# Patient Record
Sex: Female | Born: 1935 | Race: Black or African American | Hispanic: No | Smoking: Never smoker
Health system: Southern US, Community
[De-identification: ages and names within clinical notes are randomized; demographics above are authoritative.]

## PROBLEM LIST (undated history)

## (undated) DIAGNOSIS — M1990 Unspecified osteoarthritis, unspecified site: Secondary | ICD-10-CM

## (undated) DIAGNOSIS — E039 Hypothyroidism, unspecified: Secondary | ICD-10-CM

## (undated) DIAGNOSIS — I10 Essential (primary) hypertension: Secondary | ICD-10-CM

## (undated) DIAGNOSIS — B9681 Helicobacter pylori [H. pylori] as the cause of diseases classified elsewhere: Secondary | ICD-10-CM

## (undated) DIAGNOSIS — K2970 Gastritis, unspecified, without bleeding: Secondary | ICD-10-CM

## (undated) HISTORY — PX: ABDOMINAL HYSTERECTOMY: SHX81

## (undated) HISTORY — PX: CERVICAL FUSION: SHX112

## (undated) HISTORY — PX: JOINT REPLACEMENT: SHX530

## (undated) HISTORY — PX: CARPAL TUNNEL RELEASE: SHX101

---

## 1999-07-16 ENCOUNTER — Encounter: Payer: Self-pay | Admitting: Orthopedic Surgery

## 1999-07-19 ENCOUNTER — Encounter: Payer: Self-pay | Admitting: Orthopedic Surgery

## 1999-07-19 ENCOUNTER — Inpatient Hospital Stay (HOSPITAL_COMMUNITY): Admission: RE | Admit: 1999-07-19 | Discharge: 1999-07-21 | Payer: Self-pay | Admitting: Orthopedic Surgery

## 2001-09-25 ENCOUNTER — Ambulatory Visit (HOSPITAL_COMMUNITY): Admission: RE | Admit: 2001-09-25 | Discharge: 2001-09-25 | Payer: Self-pay | Admitting: General Surgery

## 2001-09-25 ENCOUNTER — Encounter: Payer: Self-pay | Admitting: General Surgery

## 2001-10-14 ENCOUNTER — Other Ambulatory Visit: Admission: RE | Admit: 2001-10-14 | Discharge: 2001-10-14 | Payer: Self-pay | Admitting: General Surgery

## 2004-10-29 ENCOUNTER — Ambulatory Visit: Payer: Self-pay | Admitting: Family Medicine

## 2005-01-09 ENCOUNTER — Ambulatory Visit: Payer: Self-pay | Admitting: Podiatry

## 2005-08-22 ENCOUNTER — Ambulatory Visit: Payer: Self-pay | Admitting: Family Medicine

## 2005-10-23 ENCOUNTER — Ambulatory Visit (HOSPITAL_COMMUNITY): Admission: RE | Admit: 2005-10-23 | Discharge: 2005-10-23 | Payer: Self-pay | Admitting: Ophthalmology

## 2005-12-25 ENCOUNTER — Ambulatory Visit (HOSPITAL_COMMUNITY): Admission: RE | Admit: 2005-12-25 | Discharge: 2005-12-25 | Payer: Self-pay | Admitting: Family Medicine

## 2005-12-25 ENCOUNTER — Ambulatory Visit: Payer: Self-pay | Admitting: Family Medicine

## 2006-01-03 ENCOUNTER — Ambulatory Visit: Payer: Self-pay | Admitting: Family Medicine

## 2006-01-06 ENCOUNTER — Ambulatory Visit: Payer: Self-pay | Admitting: Internal Medicine

## 2006-01-20 ENCOUNTER — Ambulatory Visit: Payer: Self-pay | Admitting: Internal Medicine

## 2006-02-12 ENCOUNTER — Ambulatory Visit: Payer: Self-pay | Admitting: Internal Medicine

## 2006-02-13 ENCOUNTER — Ambulatory Visit (HOSPITAL_COMMUNITY): Admission: RE | Admit: 2006-02-13 | Discharge: 2006-02-13 | Payer: Self-pay | Admitting: Ophthalmology

## 2006-03-12 ENCOUNTER — Ambulatory Visit: Payer: Self-pay | Admitting: Internal Medicine

## 2006-07-09 ENCOUNTER — Ambulatory Visit: Payer: Self-pay | Admitting: Internal Medicine

## 2006-07-18 ENCOUNTER — Ambulatory Visit: Payer: Self-pay | Admitting: Internal Medicine

## 2006-07-31 ENCOUNTER — Ambulatory Visit: Payer: Self-pay | Admitting: Internal Medicine

## 2006-08-20 ENCOUNTER — Ambulatory Visit: Payer: Self-pay | Admitting: Internal Medicine

## 2006-11-26 ENCOUNTER — Ambulatory Visit: Payer: Self-pay | Admitting: Orthopedic Surgery

## 2006-12-02 ENCOUNTER — Encounter (HOSPITAL_COMMUNITY): Admission: RE | Admit: 2006-12-02 | Discharge: 2007-01-01 | Payer: Self-pay | Admitting: Orthopedic Surgery

## 2006-12-15 ENCOUNTER — Ambulatory Visit (HOSPITAL_COMMUNITY): Admission: RE | Admit: 2006-12-15 | Discharge: 2006-12-15 | Payer: Self-pay | Admitting: Internal Medicine

## 2006-12-23 ENCOUNTER — Encounter: Payer: Self-pay | Admitting: Internal Medicine

## 2006-12-23 DIAGNOSIS — R011 Cardiac murmur, unspecified: Secondary | ICD-10-CM

## 2006-12-23 DIAGNOSIS — M545 Low back pain, unspecified: Secondary | ICD-10-CM | POA: Insufficient documentation

## 2006-12-23 DIAGNOSIS — E785 Hyperlipidemia, unspecified: Secondary | ICD-10-CM

## 2006-12-23 DIAGNOSIS — R7989 Other specified abnormal findings of blood chemistry: Secondary | ICD-10-CM | POA: Insufficient documentation

## 2006-12-23 DIAGNOSIS — J449 Chronic obstructive pulmonary disease, unspecified: Secondary | ICD-10-CM

## 2006-12-23 DIAGNOSIS — S62109A Fracture of unspecified carpal bone, unspecified wrist, initial encounter for closed fracture: Secondary | ICD-10-CM | POA: Insufficient documentation

## 2006-12-23 DIAGNOSIS — E0590 Thyrotoxicosis, unspecified without thyrotoxic crisis or storm: Secondary | ICD-10-CM | POA: Insufficient documentation

## 2006-12-23 DIAGNOSIS — G43909 Migraine, unspecified, not intractable, without status migrainosus: Secondary | ICD-10-CM | POA: Insufficient documentation

## 2006-12-23 DIAGNOSIS — J309 Allergic rhinitis, unspecified: Secondary | ICD-10-CM | POA: Insufficient documentation

## 2006-12-23 DIAGNOSIS — I739 Peripheral vascular disease, unspecified: Secondary | ICD-10-CM | POA: Insufficient documentation

## 2006-12-23 DIAGNOSIS — H269 Unspecified cataract: Secondary | ICD-10-CM

## 2006-12-23 DIAGNOSIS — I10 Essential (primary) hypertension: Secondary | ICD-10-CM | POA: Insufficient documentation

## 2006-12-23 DIAGNOSIS — J4489 Other specified chronic obstructive pulmonary disease: Secondary | ICD-10-CM | POA: Insufficient documentation

## 2006-12-23 DIAGNOSIS — K219 Gastro-esophageal reflux disease without esophagitis: Secondary | ICD-10-CM

## 2007-01-02 ENCOUNTER — Encounter (HOSPITAL_COMMUNITY): Admission: RE | Admit: 2007-01-02 | Discharge: 2007-02-01 | Payer: Self-pay | Admitting: Orthopedic Surgery

## 2007-01-14 ENCOUNTER — Ambulatory Visit (HOSPITAL_COMMUNITY): Admission: RE | Admit: 2007-01-14 | Discharge: 2007-01-14 | Payer: Self-pay | Admitting: Internal Medicine

## 2007-09-23 ENCOUNTER — Encounter (HOSPITAL_COMMUNITY): Admission: RE | Admit: 2007-09-23 | Discharge: 2007-10-23 | Payer: Self-pay | Admitting: Endocrinology

## 2007-11-19 ENCOUNTER — Encounter: Payer: Self-pay | Admitting: Family Medicine

## 2007-12-14 ENCOUNTER — Encounter (HOSPITAL_COMMUNITY): Admission: RE | Admit: 2007-12-14 | Discharge: 2008-01-13 | Payer: Self-pay | Admitting: Endocrinology

## 2008-01-01 ENCOUNTER — Ambulatory Visit (HOSPITAL_COMMUNITY): Admission: RE | Admit: 2008-01-01 | Discharge: 2008-01-01 | Payer: Self-pay | Admitting: Internal Medicine

## 2008-11-18 HISTORY — PX: COLON SURGERY: SHX602

## 2009-01-04 ENCOUNTER — Ambulatory Visit (HOSPITAL_COMMUNITY): Admission: RE | Admit: 2009-01-04 | Discharge: 2009-01-04 | Payer: Self-pay | Admitting: Internal Medicine

## 2009-01-30 ENCOUNTER — Emergency Department (HOSPITAL_COMMUNITY): Admission: EM | Admit: 2009-01-30 | Discharge: 2009-01-30 | Payer: Self-pay | Admitting: Emergency Medicine

## 2009-02-07 ENCOUNTER — Ambulatory Visit (HOSPITAL_COMMUNITY): Admission: RE | Admit: 2009-02-07 | Discharge: 2009-02-07 | Payer: Self-pay | Admitting: Internal Medicine

## 2009-02-10 ENCOUNTER — Ambulatory Visit (HOSPITAL_COMMUNITY): Admission: RE | Admit: 2009-02-10 | Discharge: 2009-02-10 | Payer: Self-pay | Admitting: Internal Medicine

## 2009-03-01 ENCOUNTER — Ambulatory Visit: Payer: Self-pay | Admitting: Neurosurgery

## 2009-03-15 ENCOUNTER — Inpatient Hospital Stay (HOSPITAL_COMMUNITY): Admission: RE | Admit: 2009-03-15 | Discharge: 2009-03-16 | Payer: Self-pay | Admitting: Neurosurgery

## 2009-03-18 HISTORY — PX: ESOPHAGOGASTRODUODENOSCOPY: SHX1529

## 2009-03-18 HISTORY — PX: COLONOSCOPY: SHX174

## 2009-04-15 ENCOUNTER — Inpatient Hospital Stay (HOSPITAL_COMMUNITY): Admission: EM | Admit: 2009-04-15 | Discharge: 2009-04-16 | Payer: Self-pay | Admitting: Emergency Medicine

## 2009-04-15 ENCOUNTER — Ambulatory Visit: Payer: Self-pay | Admitting: Gastroenterology

## 2009-04-16 ENCOUNTER — Ambulatory Visit: Payer: Self-pay | Admitting: Gastroenterology

## 2009-04-16 ENCOUNTER — Encounter: Payer: Self-pay | Admitting: Gastroenterology

## 2009-04-17 ENCOUNTER — Telehealth: Payer: Self-pay | Admitting: Gastroenterology

## 2009-04-18 ENCOUNTER — Encounter: Payer: Self-pay | Admitting: Gastroenterology

## 2009-04-19 ENCOUNTER — Encounter: Payer: Self-pay | Admitting: Gastroenterology

## 2009-04-25 ENCOUNTER — Telehealth: Payer: Self-pay | Admitting: Gastroenterology

## 2009-04-27 ENCOUNTER — Telehealth (INDEPENDENT_AMBULATORY_CARE_PROVIDER_SITE_OTHER): Payer: Self-pay

## 2009-05-19 ENCOUNTER — Encounter (INDEPENDENT_AMBULATORY_CARE_PROVIDER_SITE_OTHER): Payer: Self-pay | Admitting: Surgery

## 2009-05-19 ENCOUNTER — Inpatient Hospital Stay (HOSPITAL_COMMUNITY): Admission: RE | Admit: 2009-05-19 | Discharge: 2009-05-24 | Payer: Self-pay | Admitting: Surgery

## 2009-05-24 ENCOUNTER — Encounter: Payer: Self-pay | Admitting: Gastroenterology

## 2010-12-18 NOTE — Letter (Signed)
Summary: Historic Patient File  Historic Patient File   Imported By: Lind Guest 08/17/2010 16:04:26  _____________________________________________________________________  External Attachment:    Type:   Image     Comment:   External Document

## 2011-02-24 LAB — BASIC METABOLIC PANEL
Calcium: 8.5 mg/dL (ref 8.4–10.5)
Creatinine, Ser: 0.74 mg/dL (ref 0.4–1.2)
GFR calc Af Amer: >60 mL/min (ref >60)
Potassium: 3.5 mEq/L (ref 3.5–5.1)

## 2011-02-24 LAB — CBC
Hemoglobin: 11.1 g/dL — ABNORMAL LOW (ref 12.0–15.0)
MCHC: 33.8 g/dL (ref 30.0–36.0)
WBC: 10.8 10*3/uL — ABNORMAL HIGH (ref 4.0–10.5)

## 2011-02-25 LAB — COMPREHENSIVE METABOLIC PANEL
CO2: 29 mEq/L (ref 19–32)
Chloride: 97 mEq/L (ref 96–112)
GFR calc non Af Amer: >60 mL/min (ref >60)
Total Bilirubin: 0.6 mg/dL (ref 0.3–1.2)
Total Protein: 8 g/dL (ref 6.0–8.3)

## 2011-02-25 LAB — CBC
MCV: 82.8 fL (ref 78.0–100.0)
Platelets: 401 10*3/uL — ABNORMAL HIGH (ref 150–400)
RBC: 4.67 MIL/uL (ref 3.87–5.11)

## 2011-02-25 LAB — TYPE AND SCREEN: Antibody Screen: NEGATIVE

## 2011-02-25 LAB — DIFFERENTIAL
Basophils Absolute: 0 10*3/uL (ref 0.0–0.1)
Basophils Relative: 0 % (ref 0–1)
Eosinophils Relative: 4 % (ref 0–5)
Lymphs Abs: 1.5 10*3/uL (ref 0.7–4.0)
Neutro Abs: 5.5 10*3/uL (ref 1.7–7.7)
Neutrophils Relative %: 70 % (ref 43–77)

## 2011-02-25 LAB — PROTIME-INR: INR: 1 (ref 0.00–1.49)

## 2011-02-26 LAB — BASIC METABOLIC PANEL
BUN: 4 mg/dL — ABNORMAL LOW (ref 6–23)
Calcium: 9.1 mg/dL (ref 8.4–10.5)
Chloride: 100 mEq/L (ref 96–112)
Chloride: 105 mEq/L (ref 96–112)
Creatinine, Ser: 0.73 mg/dL (ref 0.4–1.2)
GFR calc Af Amer: >60 mL/min (ref >60)
GFR calc Af Amer: >60 mL/min (ref >60)
GFR calc non Af Amer: >60 mL/min (ref >60)
Glucose, Bld: 120 mg/dL — ABNORMAL HIGH (ref 70–99)
Potassium: 3.5 mEq/L (ref 3.5–5.1)

## 2011-02-26 LAB — DIFFERENTIAL
Eosinophils Absolute: 0.3 10*3/uL (ref 0.0–0.7)
Eosinophils Relative: 5 % (ref 0–5)
Lymphocytes Relative: 21 % (ref 12–46)
Lymphs Abs: 1.2 10*3/uL (ref 0.7–4.0)
Monocytes Absolute: 0.5 10*3/uL (ref 0.1–1.0)
Monocytes Relative: 9 % (ref 3–12)
Neutro Abs: 3.5 10*3/uL (ref 1.7–7.7)
Neutro Abs: 4.1 10*3/uL (ref 1.7–7.7)
Neutrophils Relative %: 63 % (ref 43–77)
Neutrophils Relative %: 70 % (ref 43–77)

## 2011-02-26 LAB — TYPE AND SCREEN
ABO/RH(D): O NEG
Antibody Screen: NEGATIVE

## 2011-02-26 LAB — CBC
HCT: 27.9 % — ABNORMAL LOW (ref 36.0–46.0)
MCV: 79.1 fL (ref 78.0–100.0)
MCV: 81.8 fL (ref 78.0–100.0)
Platelets: 467 10*3/uL — ABNORMAL HIGH (ref 150–400)
RBC: 3.53 MIL/uL — ABNORMAL LOW (ref 3.87–5.11)
RBC: 3.65 MIL/uL — ABNORMAL LOW (ref 3.87–5.11)
WBC: 5.5 10*3/uL (ref 4.0–10.5)

## 2011-02-26 LAB — CEA: CEA: 1.2 ng/mL (ref 0.0–5.0)

## 2011-02-26 LAB — PROTIME-INR
INR: 1.1 (ref 0.00–1.49)
Prothrombin Time: 14.3 seconds (ref 11.6–15.2)

## 2011-02-26 LAB — HEMOGLOBIN AND HEMATOCRIT, BLOOD: HCT: 24.7 % — ABNORMAL LOW (ref 36.0–46.0)

## 2011-02-27 LAB — CBC
HCT: 41.2 % (ref 36.0–46.0)
Hemoglobin: 13.8 g/dL (ref 12.0–15.0)
MCHC: 33.6 g/dL (ref 30.0–36.0)
RBC: 5.13 MIL/uL — ABNORMAL HIGH (ref 3.87–5.11)
RDW: 15.7 % — ABNORMAL HIGH (ref 11.5–15.5)

## 2011-02-27 LAB — BASIC METABOLIC PANEL
CO2: 30 mEq/L (ref 19–32)
Chloride: 95 mEq/L — ABNORMAL LOW (ref 96–112)
Glucose, Bld: 119 mg/dL — ABNORMAL HIGH (ref 70–99)
Potassium: 3.7 mEq/L (ref 3.5–5.1)
Sodium: 137 mEq/L (ref 135–145)

## 2011-02-28 LAB — DIFFERENTIAL
Basophils Absolute: 0 10*3/uL (ref 0.0–0.1)
Basophils Relative: 0 % (ref 0–1)
Eosinophils Absolute: 0.2 10*3/uL (ref 0.0–0.7)
Eosinophils Relative: 2 % (ref 0–5)
Lymphs Abs: 1.2 10*3/uL (ref 0.7–4.0)
Neutrophils Relative %: 80 % — ABNORMAL HIGH (ref 43–77)

## 2011-02-28 LAB — CBC
Hemoglobin: 12.8 g/dL (ref 12.0–15.0)
MCHC: 33.8 g/dL (ref 30.0–36.0)
MCV: 79.5 fL (ref 78.0–100.0)
RBC: 4.76 MIL/uL (ref 3.87–5.11)
WBC: 8.9 10*3/uL (ref 4.0–10.5)

## 2011-02-28 LAB — URINALYSIS, ROUTINE W REFLEX MICROSCOPIC
Bilirubin Urine: NEGATIVE
Glucose, UA: NEGATIVE mg/dL
Hgb urine dipstick: NEGATIVE
Protein, ur: NEGATIVE mg/dL
Urobilinogen, UA: 0.2 mg/dL (ref 0.0–1.0)

## 2011-02-28 LAB — COMPREHENSIVE METABOLIC PANEL
ALT: 20 U/L (ref 0–35)
AST: 27 U/L (ref 0–37)
Alkaline Phosphatase: 67 U/L (ref 39–117)
CO2: 30 mEq/L (ref 19–32)
Calcium: 9.8 mg/dL (ref 8.4–10.5)
Chloride: 93 mEq/L — ABNORMAL LOW (ref 96–112)
GFR calc Af Amer: >60 mL/min (ref >60)
GFR calc non Af Amer: >60 mL/min (ref >60)
Glucose, Bld: 108 mg/dL — ABNORMAL HIGH (ref 70–99)
Sodium: 136 mEq/L (ref 135–145)
Total Bilirubin: 0.5 mg/dL (ref 0.3–1.2)

## 2011-02-28 LAB — URINE MICROSCOPIC-ADD ON

## 2011-02-28 LAB — POCT CARDIAC MARKERS: Troponin i, poc: <0.05 ng/mL (ref 0.00–0.09)

## 2011-02-28 LAB — TSH: TSH: 0.459 u[IU]/mL (ref 0.350–4.500)

## 2011-04-02 NOTE — Discharge Summary (Signed)
NAMEALEZANDRA, [name]                ACCOUNT NO.:  [id number]   MEDICAL RECORD NO.:  [id number]          PATIENT TYPE:  INP   LOCATION:  1528                         FACILITY:  [facility]   PHYSICIAN:  Wilmon Arms. Corliss Skains, M.D. DATE OF BIRTH:  Jan 23, 1929   DATE OF ADMISSION:  05/19/2009  DATE OF DISCHARGE:  05/24/2009                               DISCHARGE SUMMARY   ADMISSION DIAGNOSIS:  Cecal mass.   DISCHARGE DIAGNOSIS:  Cecal mass.   PROCEDURE PERFORMED:  Laparoscopic-assisted right hemicolectomy.   BRIEF HISTORY:  This is a 77 year old female who has a past medical  history of hypertension, hypothyroidism, hiatal hernia, hyperlipidemia  heart murmur, who presents after having a recent GI bleed.  GI workup  included a large cecal mass seen on colonoscopy.  It was biopsied and  did not show carcinoma, but the gross examination was highly suspicious.  The initial biopsy was read as inflamed granulation tissue and some  atypical surface cells.  Based on these findings, it was recommended  that she have a right hemicolectomy.  Her CEA level preoperatively was  normal.   HOSPITAL COURSE:  After a bowel prep at home, the patient was admitted  to the hospital on May 19, 2009, where she underwent a laparoscopic-  assisted right hemicolectomy.  The patient tolerated the procedure well.  She was maintained on clear liquids with a PCA pump postoperatively.  She began having bowel sounds and some flatus on postop day #3.  As the  ileus resolved, her diet was slowly advanced.  She was advanced to a  regular diet on postop day #4.  Her pathology report returned finding no  malignancy.  The mucosa was ulcerated and showed some granulation tissue  and reactive changes, but no sign of cancer.  This was relayed to the  patient.  On the date of discharge, she is having bowel movements,  tolerating regular diet and her pain is controlled with p.r.n. Darvocet.  Her incisions all look good.   DISCHARGE  INSTRUCTIONS:  She was given Darvocet p.r.n. for pain.  She  should resume all of her previous medications.  Follow-up in 1 week with  Dr. Corliss Skains for staple removal.      Wilmon Arms. Tsuei, M.D.  Electronically Signed     MKT/MEDQ  D:  05/24/2009  T:  05/24/2009  Job:  [id number]

## 2011-04-02 NOTE — Op Note (Signed)
[name], [name]                ACCOUNT NO.:  [id number]   MEDICAL RECORD NO.:  [id number]          PATIENT TYPE:  INP   LOCATION:  A339                          FACILITY:  [facility]   PHYSICIAN:  Kassie Mends, M.D.      DATE OF BIRTH:  Mar 18, 1929   DATE OF PROCEDURE:  04/16/2009  DATE OF DISCHARGE:  04/16/2009                               OPERATIVE REPORT   PROCEDURE:  1. Ileocolonoscopy with jumbo cold forceps biopsies.  2. Esophagogastroduodenoscopy with cold forceps biopsies.   INDICATION FOR EXAM:  [name] is a 77 year old female who presents  with new-onset dyspepsia and painless rectal bleeding.   FINDINGS:  1. Normal terminal ileum approximately 5 cm visualized.  2. Large circumferential mass in the cecum extending just proximal to      the ileocecal valve.  It appeared ulcerated.  Jumbo biopsies were      obtained via cold forceps.  Otherwise, no evidence of polyps,      diverticula or AVMs.  3. Normal retroflexed view of the rectum.  4. Normal esophagus without evidence of Barrett's, mass, erosion,      ulceration or stricture.  5. Diffuse erythema in the stomach without erosion or ulceration.      Biopsies obtained via cold forceps to evaluate for H. pylori      gastritis.  6. Patchy erythema in the duodenal bulb and extending into the second      portion of the duodenum.  No erosions or ulcerations.  Moderate      bile staining.   DIAGNOSES:  1. Large cecal mass, likely adenocarcinoma, biopsies pending.  2. Moderate gastritis, biopsies pending.   RECOMMENDATIONS:  1. Continue PPI daily.  2. Avoid aspirin and anti-inflammatory drugs.  May continue Pletal.  3. Will obtain CT scan of the abdomen and pelvis with IV and oral      contrast, chest x-ray PA and lateral, and CEA today.  4. May advance to a low-residue diet.  She may be discharged to home.  5. I discussed these findings with [name] and her family.  They      prefer a surgical evaluation in  [place].  She will be referred      to [facility], in [place], at [phone].   MEDICATIONS:  1. Demerol 50 mg IV.  2. Versed 6 mg IV.   PROCEDURE TECHNIQUE:  Physical exam was performed.  Informed consent was  obtained from the patient after explaining the benefits, risks and  alternatives to the procedure.  The patient was connected to the monitor  and placed in the left lateral position.  Continuous oxygen was provided  by nasal cannula and IV medicine administered through an indwelling  cannula.  After administration of sedation and rectal exam, the  patient's rectum was intubated and the scope was advanced under direct  visualization to the distal terminal ileum.  The scope was removed  slowly by carefully examining the color, texture, anatomy and integrity  of the mucosa on the way out.   After colonoscopy, the patient's esophagus was intubated  with a  diagnostic gastroscope.  The endoscope was advanced under direct  visualization to the second portion of the duodenum.  The scope was  removed slowly by carefully examining the color, texture, anatomy and  integrity of the mucosa on the way out.  The patient was recovered in  endoscopy and discharged home in satisfactory condition.   ADDENDUM:  The CT scan of the abdomen and pelvis showed a  circumferential cecal mass.  She had some subcentimeter lymph nodes in  the region.  Otherwise, the liver was normal.  The chest x-ray, PA and  lateral, showed some mild cardiomegaly but had no evidence of metastatic  disease.  Her CEA is 1.2. Discussed with pt 04/17/09.      Kassie Mends, M.D.  Electronically Signed     SM/MEDQ  D:  04/17/2009  T:  04/17/2009  Job:  [id number]   cc:   Tesfaye D. Felecia Shelling, MD  Fax: [phone]   Wilmon Arms. Corliss Skains, M.D.  [address] [place] [phone]  [place]

## 2011-04-02 NOTE — Discharge Summary (Signed)
NAMEBRYANAH, [name]                ACCOUNT NO.:  [id number]   MEDICAL RECORD NO.:  [id number]          PATIENT TYPE:  INP   LOCATION:  A339                          FACILITY:  [facility]   PHYSICIAN:  Tesfaye D. Felecia Shelling, MD   DATE OF BIRTH:  01/05/1929   DATE OF ADMISSION:  04/15/2009  DATE OF DISCHARGE:  05/30/2010LH                               DISCHARGE SUMMARY   DISCHARGE DIAGNOSES:  1. Lower gastrointestinal bleed secondary to cecal mass.  2. Anemia secondary to the above.  3. Cervical degenerative disk disease and status post cervical      diskectomy.  4. Hypertension.  5. Osteoarthritis.  6. Hypothyroidism.  7. Peripheral vascular disease.   DISCHARGE MEDICATIONS:  1. KCl 20 mEq daily.  2. Felodipine 10 mg daily.  3. Clonidine 0.2 mg daily.  4. Alprazolam 0.5 mg b.i.d.  5. Simvastatin 40 mg daily.  6. Cilostazol 100 mg p.o. b.i.d.  7. Synthroid 88 mcg daily.  8. Metoprolol 100 mg daily.  9. Furosemide 40 mg daily.  10.Omeprazole 20 mg daily.  11.ProAir handheld inhaler q.4 h. as needed.   HOSPITAL COURSE:  This is a 77 year old female patient with history of  multiple medical illnesses, who was admitted through emergency room due  to rectal bleed.  The patient noticed a sudden onset of rectal bleed.  Her hemoglobin and hematocrit dropped to 8.5 and 24.7.  The patient was  admitted and transfused 2 units of packed red blood cells.  GI consult  was done and the patient underwent colonoscopy, which showed a large  cecal mass.  The patient was discharged after arrangement was done with  GI to be followed for further treatment.      Tesfaye D. Felecia Shelling, MD  Electronically Signed     TDF/MEDQ  D:  05/09/2009  T:  05/09/2009  Job:  [id number]

## 2011-04-02 NOTE — Op Note (Signed)
[name], [name]                ACCOUNT NO.:  [id number]   MEDICAL RECORD NO.:  [id number]          PATIENT TYPE:  INP   LOCATION:  3535                         FACILITY:  [facility]   PHYSICIAN:  Donalee Citrin, M.D.        DATE OF BIRTH:  03/07/1929   DATE OF PROCEDURE:  03/15/2009  DATE OF DISCHARGE:                               OPERATIVE REPORT   PREOPERATIVE DIAGNOSIS:  Cervical spondylosis with stenosis, spinal cord  compression, and some gliosis in the spinal cord with myelopathy at C4-  C5.   PROCEDURE:  Anterior cervical diskectomy and fusion at C4-C5 using a 7-  mm allograft wedge and a 25-mm Venture plate with four 30-mm variable  angle screws.   SURGEON:  Donalee Citrin, MD   ASSISTANT:  Kathaleen Maser. Pool, MD   ANESTHESIA:  General endotracheal.   HISTORY OF PRESENT ILLNESS:  The patient is a very pleasant 79-year  female with a history of C5-C7 fusion.  She has had progressive  worsening pain into both shoulders and weakness in arms and hands.  MRI  scan showed severe spinal cord compression at C4-C5 with some signal  change in the cord due to combination of disk with spondylosis.  The  patient was recommended anterior cervical diskectomy and fusion due to  exam consistent with myelopathy and MRI consistent with spinal cord  compression.  Risks and benefits of the operation were explained to the  patient.  She understood and agreed to proceed forward.   PROCEDURE IN DETAIL:  The patient was brought to the OR, was induced  under general anesthesia, positioned supine, the neck flexed in  extension with 5 pounds of Halter traction.  The right side of the neck  was prepped and draped in usual sterile fashion.  Preoperative x-ray  localized the appropriate level.  A curvilinear incision was made just  off the midline to the anterior border of the sternocleidomastoid.  The  superficial layer of the platysma was dissected out and divided  longitudinally.  The avascular plane between  the sternocleidomastoid and  strap muscles was developed down to the prevertebral fascia.  Prevertebral fascia was then dissected with Kittners.  Intraoperative x-  ray identified the C3-C4 disk space.  Attention was taken to the one  disk space below this.  The anterior margin of the disk was markedly  calcified.  This was bitten away with a 2-mm Kerrison.  The disk space  was then identified on further incising and annulotomy extended.  A 3-mm  Kerrison was used to bite off the anterior aspects of C4 vertebral body.  Then using a BA curette and a high-speed drill, the disk space was  scraped and drilled down the posterior annulus and osteophyte complex.  At this point, the operating microscope was draped, brought into the  field under microscopic illumination, and remainder of the posterior  osteophytes were trimmed down to the posterior longitudinal ligament  which was removed in piecemeal fashion decompressing the central canal.  The ligament was then identified.  The plane between the ligament of  dura was  developed with a black nerve hook and underbitten aggressively  at both endplates with a 2-mm Kerrison.  Both C5 pedicles were  identified and both C5 nerve roots were decompressed.  There was marked  spondylosis from a combination of soft disk material displacing the  thecal sac and spinal cord as well as some marked uncinate hypertrophy.  After all this was underbitten and removed, the C5 neural foramina were  widely patent.  The endplates were then scraped to receive the bone  graft.  A size 7-mm graft was then inserted under compression.  After  meticulous hemostasis had been maintained, a 25-mm Venture plate was  then placed.  All screws had excellent purchase.  Postop fluoroscopy  confirmed good position of plate, screws, and bone graft.  Wound was  then copiously irrigated.  Meticulous hemostasis was maintained.  Platysma was reapproximated with interrupted Vicryl and the  skin was  closed with running 4-0 subcuticular.  Benzoin and Steri-Strips were  applied.  The patient went to recovery room in stable condition.  At the  end of the case, all needle and instrument counts were correct.           ______________________________  Donalee Citrin, M.D.     GC/MEDQ  D:  03/15/2009  T:  03/16/2009  Job:  [id number]

## 2011-04-02 NOTE — H&P (Signed)
NAMEINELL, MIMBS                ACCOUNT NO.:  [id number]   MEDICAL RECORD NO.:  [id number]          PATIENT TYPE:  INP   LOCATION:  A339                          FACILITY:  [facility]   PHYSICIAN:  Angus G. Renard Matter, MD   DATE OF BIRTH:  10-06-1929   DATE OF ADMISSION:  04/15/2009  DATE OF DISCHARGE:  LH                              HISTORY & PHYSICAL   HISTORY OF PRESENT ILLNESS:  A 77 year old white female presented to the  emergency department with chief complaint being rectal bleeding, but  this was moderate amount and some slight nausea which developed shortly  prior to admission.  She was evaluated by ED physician.   Lab studies obtained, CBC shows hemoglobin of 9.6, hematocrit 27.9, and  subsequent hemoglobin 8.5, hematocrit 24.7.  Intravenous fluids was  started in the ED.  The patient was typed and crossmatched for blood,  subsequently admitted.   SOCIAL HISTORY:  The patient does not smoke, drink, or use drugs.   MEDICAL HISTORY:  Prior history of hypertension, arthritis,  hypothyroidism, peripheral vascular disease, prior history of back  surgery, hysterectomy, knee surgery, and more recent surgery on cervical  spine.   ALLERGIES:  PENICILLIN.   MEDICATION LIST:  1. Aspirin 81 mg daily.  2. Clonidine 0.2 mg daily.  3. Felodipine 10 mg daily.  4. Metoprolol tartrate 100 mg daily.  5. Naprosyn 500 mg b.i.d.  6. Omeprazole 20 mg daily.  7. Synthroid 88 mcg daily.  8. Simvastatin 40 mg daily.  9. Cilostazol 100 mg twice a day.  10.Alprazolam 0.5 mg b.i.d.  11.Furosemide 40 mg p.r.n.  12.Provera HFA 2 puffs q.4 h. p.r.n.   REVIEW OF SYSTEMS:  HEENT:  Negative.  CARDIOPULMONARY:  No cough,  hemoptysis, or dyspnea.  GI:  Bouts of nausea and gross blood per rectum  relatively acute onset.  GU:  No dysuria or hematuria.   PHYSICAL EXAMINATION:  GENERAL:  Alert appearing female.  VITAL SIGNS:  Blood pressure 144/73, respirations 16, pulse 91, and  temperature 97.8.  HEENT:  Eyes: PERRLA.  TMs negative.  Oropharynx benign.  Negative for  pallor or mucous membranes.  NECK:  Supple.  No JVD or thyroid abnormalities.  HEART:  Regular rhythm.  No murmurs.  LUNGS: Clear to P and A.  ABDOMEN:  No palpable organs or masses.  No organomegaly.  SKIN:  Warm and dry.  NEUROLOGIC:  No focal deficit.   ASSESSMENT:  The patient admitted with abrupt onset of rectal bleeding,  anemia; secondary to blood loss, history of hypertension,  hypothyroidism, recent cervical diskectomy.   PLAN:  Admit the patient, start IV fluids, type and crossmatch for 2  units of packed RBCs transfuse once available, GI consult.  Continue to  monitor hemoglobin and hematocrit.      Angus G. Renard Matter, MD  Electronically Signed     AGM/MEDQ  D:  04/15/2009  T:  04/16/2009  Job:  [id number]

## 2011-04-02 NOTE — Consult Note (Signed)
[name], [name]                ACCOUNT NO.:  [id number]   MEDICAL RECORD NO.:  [id number]          PATIENT TYPE:  INP   LOCATION:  A339                          FACILITY:  [facility]   PHYSICIAN:  Kassie Mends, M.D.      DATE OF BIRTH:  07-29-1929   DATE OF CONSULTATION:  04/15/2009  DATE OF DISCHARGE:                                 CONSULTATION   REFERRING Kailen Name:  Angus G. Renard Matter, MD.   PRIMARY Raif Chachere:  Tesfaye D. Felecia Shelling, MD.   REASON FOR CONSULTATION:  Rectal bleeding.   HISTORY OF PRESENT ILLNESS:  [name] is a 77 year old female whose  last colonoscopy was over 10 years ago.  She reports having negative  Hemoccults within the last 5 years.  She also had an upper endoscopy  over 10 years ago and reports that it was normal.  She had a cervical  diskectomy in April 2010 and ever since then, has had nausea and early  satiety.  This morning when she went to urinate, she felt like she  needed to have a bowel movement and then began to have sensation as if  she was peeing from behind.  She wiped and she saw blood, and she  looked in the commode and it was full of blood.  This happened once.  She did not have any problems with abdominal pain.  After her surgery,  she has had watery stool, but that has resolved.  The watery stool was  followed by 3-4 days of constipation.  The nausea and the early satiety  have persisted.  She is easily gagged.  She reports losing weight.  She weighed 180 pounds in April 2010 and was weighed on admission at 176  pounds.  She has mild heartburn which was more well controlled with  Prevacid.  She is on omeprazole because she cannot afford Prevacid.  She  denies any problems swallowing.  She is on pentoxifylline for peripheral  vascular disease.  She denies being on Coumadin or Plavix.  She takes a  baby aspirin daily.  She has a prescription for Naproxen, but denies  taking any over the last month.  She is using Vicodin as needed for  pain.  She  had a solid bowel movement yesterday and today.   PAST MEDICAL HISTORY:  1. Hypertension.  2. Anxiety.  3. Hypothyroidism.  4. Remote history of polyps.  5. Hiatal hernia.  6. Hyperlipidemia.  7. Reported blood clots in her left foot  8. Peripheral vascular disease.   PAST SURGICAL HISTORY:  1. Bilateral cataract surgery.  2. Cervical diskectomy 2010  3. Hysterectomy.  4. Bilateral Knee replacement   ALLERGIES:  PENICILLIN.   MEDICATIONS:  1. Xanax.  2. Catapres.  3. Plendil.  4. Levothroid  5. Toprol.  6. Protonix daily.  7. Zocor.   FAMILY HISTORY:  She denies any family history of colon cancer or colon  polyps.   SOCIAL HISTORY:  She has no history of tobacco or alcohol use.  She is a  retired Associate Professor.  She is recently widowed.  She has 2  children.   REVIEW OF SYSTEMS:  Per the HPI, otherwise all systems are negative.   PHYSICAL EXAMINATION:  VITAL SIGNS:  T-max 98.3, systolic blood pressure  144-132.  GENERAL:  She is in no apparent distress, alert and oriented  x4.  HEENT:  Atraumatic, normocephalic.  Pupils are equal and react to  light.  Mouth:  No oral lesions.  Posterior pharynx without erythema or  exudate.  NECK:  Full range of motion.  No lymphadenopathy.  LUNGS:  Clear to auscultation bilaterally.  CARDIOVASCULAR:  Regular rhythm, no murmur, normal S1-S2.  ABDOMEN:  Bowel sounds are present, soft, nontender, nondistended.  No rebound or  guarding.  EXTREMITIES:  No cyanosis or edema.  She has a scar on her  right knee which is well-healed and consistent with right knee  replacement.   LABORATORY DATA:  White count 5.9, hemoglobin 9.6-8.5 (last hemoglobin  April 2010 13.8), creatinine 0.86, INR 1.1.   ASSESSMENT:  [name] is a 77 year old female who presents with  painless rectal bleeding.  She has had a 5 gm drop in her hemoglobin.  The most likely reason is a diverticular bleed.  The differential  diagnosis includes AVMs and a low  likelihood of colorectal cancer or  polyps.  Thank you for allowing me to see [name] in consultation.  My recommendations follow.   RECOMMENDATIONS:  1. She has a history of new onset dyspepsia and nausea with early      satiety.  The differential diagnosis includes NSAID gastritis, H.      pylori gastritis, peptic ulcer disease,  gastroparesis or post-      anesthesia induced gastroparesis.  She will be given Zofran around-      the-clock.  I did caution her on the GoLYTELY that if she becomes      nauseated and has vomiting or bloating, she should stop the prep      for 2 hours and then restart.  She will be scheduled for an upper      endoscopy as well for new onset dyspepsia.  2. Fall precautions, bedside commode and stop naproxen.  We will give      her Dulcolax now and 2 hours after the GoLYTELY is started.  She      also will have tap water enemas in the morning.  3. Will consider her for a colonoscopy followed by an EGD for Apr 16, 2009 and it will be performed at 11 a.m.      Kassie Mends, M.D.  Electronically Signed     SM/MEDQ  D:  04/15/2009  T:  04/15/2009  Job:  [id number]   cc:   Tesfaye D. Felecia Shelling, MD  Fax: [phone]   Wilmon Arms. Corliss Skains, M.D.  [address] [place] [phone]  [place]

## 2011-04-02 NOTE — Op Note (Signed)
[name], [name]                ACCOUNT NO.:  [id number]   MEDICAL RECORD NO.:  [id number]          PATIENT TYPE:  INP   LOCATION:  1528                         FACILITY:  [facility]   PHYSICIAN:  Wilmon Arms. Corliss Skains, M.D. DATE OF BIRTH:  05/28/1929   DATE OF PROCEDURE:  05/19/2009  DATE OF DISCHARGE:                               OPERATIVE REPORT   PREOPERATIVE DIAGNOSES:  Cecal mass.   POSTOPERATIVE DIAGNOSES:  Cecal mass.   PROCEDURE PERFORMED:  Laparoscopic-assisted right hemicolectomy.   SURGEON:  Wilmon Arms. Corliss Skains, M.D.   ASSISTANT:  Dr. Karie Soda.   ANESTHESIA:  General endotracheal.   INDICATIONS:  This is a 77 year old female who recently developed a GI  bleed.  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  The biopsy, however, did not show evidence of  carcinoma, but the gross examination was highly suspicious.  Her CEA  level preoperatively is normal.  She presents now for elective  resection.   DESCRIPTION OF PROCEDURE:  The patient was brought to the operating  room, placed in the supine position on the operating room table.  After  an adequate level of general anesthesia was obtained, a Foley catheter  was placed under sterile technique.  The patient's abdomen was prepped  with Chloraprep and draped in sterile fashion.  A time-out was taken to  assure the proper patient, proper procedure.  We infiltrated the area  just below her left costal margin with 0.25% Marcaine with epinephrine.  I made an 11-mm incision.  An OptiVu trocar was used to cannulate the  peritoneal cavity.  We insufflated with CO2, maintaining maximal  pressure of 15 mmHg.   The laparoscope was inserted and the patient was positioned with tilt to  her left in Trendelenburg.  A 5-mm port was placed in the right upper  quadrant.  We could visualize the cecum.  There were some adhesions to  the lateral abdominal wall.  There were also some adhesions down in the  pelvis  on the right.  The Gel Port device was then brought onto the  field.  We made a 7-cm incision in the lower midline, extending up just  past her umbilicus.  We opened the fascia and inserted the Gel Port  device.  We reinsufflated and I inserted my left hand.  We then  mobilized the cecum by dividing the adhesions.  We mobilized the  terminal ileum up out of the pelvis.  The lateral attachments of the  ascending colon were mobilized with cautery.  We continued up around the  hepatic flexure.  There were some adhesions to the gallbladder, which  were taken down.  We continued until we were past the midpoint of the  transverse colon.  Once the colon seemed appropriately mobile, we  released our pneumoperitoneum and exteriorized the right colon through  the Gel Port device.  The transverse colon was divided just past the  hepatic flexure with a GIA75 stapler.  The terminal ileum was divided  with another firing of the GIA75.  The mesentery was taken with  a  LigaSure device.  The specimen was passed off the field and sent for  pathologic examination.  The gross report is that we have margins  greater than 9 cm in both directions.   The terminal ileum was mobilized from some chronic adhesions.  We then  created a side-to-side stapled anastomosis, using another firing of the  GIA75 stapler.  The enterotomy was closed with a TA-60.  Several  bleeding areas at the staple line were oversewn with 3-0 silk.  The  mesenteric defect was closed with 2-0 silk figure-of-eight sutures.  A  crotch suture of 3-0 silk was placed at the crotch of the anastomosis.   Once we were satisfied with our anastomosis, we placed it back inside  the peritoneal cavity and replaced the Gel Port device.  We irrigated  with 2 liters of saline.  No gross bleeding was noted.  The omentum was  placed to cover the anastomosis.  Pneumoperitoneum was released as we  removed all the trocars and the Gel Port.  The fascia at the  midline  incision was closed with a single-stranded #1 PDS.  The subcutaneous  tissues were irrigated and staples were used to close all the skin  incisions.  The patient was then extubated and brought to recovery in  stable condition.  All sponge, instrument, and needle counts were  correct.      Wilmon Arms. Tsuei, M.D.  Electronically Signed     MKT/MEDQ  D:  05/19/2009  T:  05/19/2009  Job:  [id number]

## 2011-04-02 NOTE — H&P (Signed)
NAMEMYLIA, PONDEXTER                ACCOUNT NO.:  [id number]   MEDICAL RECORD NO.:  [id number]          PATIENT TYPE:  INP   LOCATION:  A339                          FACILITY:  [facility]   PHYSICIAN:  Tesfaye D. Felecia Shelling, MD   DATE OF BIRTH:  05-11-1929   DATE OF ADMISSION:  04/15/2009  DATE OF DISCHARGE:  LH                              HISTORY & PHYSICAL   CHIEF COMPLAINT:  Blood in stool.   HISTORY OF PRESENT ILLNESS:  This is a 77 year old female patient with a  history of multiple medical illnesses who came to emergency room early  this morning due to the above complaint.  The patient recently had  anterior cervical diskectomy and fusion at C5 and C7 due to cervical  spondylosis with stenosis and spinal cord compression.  Since the  surgery, the patient had some nausea and abdominal discomfort.  She has  been taking her regular medications.  Early this morning, the patient  had bloody diarrhea.  She then came to emergency room where she had CBC  and other tests.  Her CBC showed significant drop in her hemoglobin and  hematocrit.  She was also noticed to have bloody stool.  The patient was  on type and crossmatched and admitted for further treatment.   REVIEW OF SYSTEMS:  The patient feels nauseated, but she has no  hematemesis or vomiting.  No significant abdominal pain.  No fever,  chills, cough, chest pain, shortness of breath, dysuria, urgency or  frequency of urination.   PAST MEDICAL HISTORY:  1. Cervical disk degenerative disease.  2. Status post cervical diskectomy with fusion at C4 and C5.  3. Hypertension.  4. Osteoarthritis.  5. Hypothyroidism.  6. Peripheral vascular disease.   CURRENT MEDICATIONS:  1. Aspirin 81 mg daily.  2. Clonidine 0.2 mg daily.  3. Metoprolol 50 mg daily.  4. Naprosyn 500 mg daily.  5. Omeprazole 20 mg daily.  6. Synthroid 88 mcg daily.  7. Simvastatin 40 mg daily.  8. Pletal 100 mg b.i.d.  9. Alprazolam 0.5 mg b.i.d.  10.Lasix 40 mg  daily.   SOCIAL HISTORY:  The patient has no history of alcohol, tobacco, or  substance abuse.   PHYSICAL EXAMINATION:  GENERAL:  The patient is alert and awake and sick  looking.  VITAL SIGNS:  Blood pressure 158/71, pulse 97, respiratory rate 20, and  temperature 98.9 degrees Fahrenheit.  HEENT: Pupils are equal and reactive.  NECK:  Supple.  Chest:  Clear lung fields, good air entry.  CARDIOVASCULAR SYSTEM:  First and second heart sound heard.  No murmur.  No gallop.  ABDOMEN:  Soft and lax.  Bowel sound is positive.  No mass or  organomegaly.  EXTREMITIES:  No leg edema.   LABS ON ADMISSION:  CBC, WBC 5.9, hemoglobin 9.6, hematocrit 27.9,  platelet 469.  Repeat hemoglobin/hematocrit showed hemoglobin of 8.5,  hematocrit of 24.  BMP, sodium 138, potassium 3.5, chloride 100, carbon  dioxide 13, glucose 120, BUN 4, creatinine 0.6, and calcium 9.0.   ASSESSMENT:  1. Acute gastrointestinal bleed probably secondary to non-steroidal  anti-inflammatory medications.  2. Anemia secondary to the above.  3. History of cervical diskogenic disease and status post cervical      diskectomy.  4. Hypertension.  5. Osteoarthritis.  6. Hypothyroidism.   PLAN:  We will continue blood transfusion.  We will monitor CBC.  We  will start the patient on Protonix.  We will discontinue all anti-  inflammatory medications.  We will do GI consult and continue the  patient on her regular medications.      Tesfaye D. Felecia Shelling, MD  Electronically Signed     TDF/MEDQ  D:  04/15/2009  T:  04/16/2009  Job:  [id number]

## 2011-04-05 NOTE — Op Note (Signed)
NAMEJREAM, [name]                ACCOUNT NO.:  [id number]   MEDICAL RECORD NO.:  [id number]          PATIENT TYPE:  AMB   LOCATION:  DAY                           FACILITY:  [facility]   PHYSICIAN:  Trish Fountain, MD    DATE OF BIRTH:  Sep 27, 1929   DATE OF PROCEDURE:  02/13/2006  DATE OF DISCHARGE:  02/13/2006                                 OPERATIVE REPORT   PREOPERATIVE DIAGNOSIS:  Cataract, right eye.   POSTOPERATIVE DIAGNOSIS:  Cataract, right eye.   SURGERY:  Kelman phacoemulsification, right eye, with posterior chamber  intraocular lens, right eye.   ANESTHESIA:  MAC with topical anesthesia of the right eye.   SURGEON:  Trish Fountain, MD   SPECIMENS:  None.   COMPLICATIONS:  None.   HISTORY:  This is a 77 year old female who has slowly progressive decrease  in vision in the right eye.   LENS MODEL:  AMO ZA9003, 25.0 diopter lens, serial #[id number].   DESCRIPTION OF PROCEDURE:  In the preoperative area, the patient had  Cyclogyl and Neo-Synephrine drops in the right eye in order to dilate the  eye along with Tetracaine to help anesthetize the eye.  Once the patient's  right eye was dilated, the patient was taken to the operating room and  prepped.  The right eye was prepped and draped in the usual sterile manner.  A lid speculum was placed in the right eye, and 2% Xylocaine jelly was  placed in the right eye as well.  A paracentesis was made through clear  cornea at the limbus at approximately the 11 o'clock position of the right  eye.  Nonpreserved Xylocaine 1% 1 cc was placed into the anterior chamber  for one minute.  Viscoat was then used to fill the anterior chamber.  Using  a 2.75 mm blade at the 9 o'clock position, an incision into the anterior  chamber was made through clear cornea near the limbus.  Viscoat was again  used to reform the anterior chamber.  A 25 gauge bent capsulotomy needle was  used to begin the capsulorrhexis through the anterior capsule  of the lens.  Utrata forceps were used to make a 360 degree anterior capsulorrhexis.  A  Chang 27 gauge irrigating cannula was used to hydrodissect and  hydrodelineate the nucleus.  Once hydrodissection and hydrodelineation was  carried out, [facility] phacoemulsification was used to make a deep groove in  the lens nucleus.  The lens was rotated 360 degrees and divided into four  quadrants using deep grooves made by phacoemulsification with the [facility]  phacoemulsification tip.  The nucleus was then divided using the phaco tip  and the nucleus manipulator.  The nuclear quadrants were then removed using  phacoemulsification.  The irrigation aspiration was then used to remove the  remainder of the cortex.  The anterior chamber and posterior capsule was  filled with Provisc, and the 9 o'clock position incision was slightly  widened, using the same 2.75 mm blade that was initially used to make the  incision.  An intraocular lens was placed in the shooter,  and this was  placed in the eye, followed by placement of the trailing haptic into the  posterior capsule, using the Kugelan.  Irrigation/aspiration was then used  to remove Provisc from the anterior chamber and the posterior capsule.  BSS  on a syringe was then used to hydrate the cornea at the 9 o'clock incision  site.  The incision site was then checked for water tightness, using a Weck-  cel.  Half-strength Betadine solution was placed, 1 drop, in the inner  canthus, and 1 drop in the outer canthus.  After one minute, this was rinsed  from the eye.  Drops were placed in the eye, Vigamox, followed by Nevanac  followed by Econopred.  A shield was placed over the patient's right eye,  and the patient was sent to the recovery room in satisfactory condition.      Trish Fountain, MD  Electronically Signed     PVK/MEDQ  D:  02/13/2006  T:  02/15/2006  Job:  [id number]

## 2011-04-05 NOTE — Op Note (Signed)
NAMEBERGEN, [name]                ACCOUNT NO.:  [id number]   MEDICAL RECORD NO.:  [id number]          PATIENT TYPE:  AMB   LOCATION:  DAY                           FACILITY:  [facility]   PHYSICIAN:  Trish Fountain, MD    DATE OF BIRTH:  November 06, 1929   DATE OF PROCEDURE:  10/23/2005  DATE OF DISCHARGE:  10/23/2005                                 OPERATIVE REPORT   /PREOPERATIVE DIAGNOSIS:  Cataract, left eye.   POSTOPERATIVE DIAGNOSIS:  Cataract, left eye.   SURGERY:  Kelman phacoemulsification, left eye, with posterior chamber  intraocular lens, left eye.   ANESTHESIA:  MAC with topical anesthesia of the left eye.   SURGEON:  Trish Fountain, MD   SPECIMENS:  None.   COMPLICATIONS:  None.   HISTORY:  This is a 77 year old female who has slowly progressive decrease  in vision in the left eye.   LENS MODEL:  AMO ZA9003, 25.0 diopter lens, serial # [id number].   DESCRIPTION OF PROCEDURE:  In the preoperative area, the patient had  Cyclogyl and Neo-Synephrine drops in the left eye in order to dilate the eye  along with Tetracaine to help anesthetize the eye.  Once the patient's left  eye was dilated, the patient was taken to the operating room and prepped.  The left eye was prepped and draped in the usual sterile manner.  A lid  speculum was placed in the left eye, and 2% Xylocaine jelly was placed in  the left eye as well.  A paracentesis was made through clear cornea at the  limbus at approximately the 5 o'clock position of the left eye.  Nonpreserved Xylocaine 1% 1 cc was placed into the anterior chamber for one  minute.  Viscoat was then used to fill the anterior chamber.  Using a 2.75  mm blade at the 3 o'clock position, an incision into the anterior chamber  was made through clear cornea near the limbus.  Viscoat was again used to  reform the anterior chamber.  A 25 gauge bent capsulotomy needle was used to  begin the capsulorrhexis through the anterior capsule of the lens.   Utrata  forceps were used to make a 360 degree anterior capsulorrhexis.  A Chang 27  gauge irrigating cannula was used to hydrodissect and hydrodelineate the  nucleus.  Once hydrodissection and hydrodelineation was carried out, [facility]  phacoemulsification was used to make a deep groove in the lens nucleus.  The  lens was rotated 360 degrees and divided into four quadrants using deep  grooves made by phacoemulsification with the [facility] phacoemulsification tip.  The nucleus was then divided using the phaco tip and the nucleus  manipulator.  The nuclear quadrants were then removed using  phacoemulsification.  The irrigation aspiration was then used to remove the  remainder of the cortex.  The anterior chamber and posterior capsule was  filled with Provisc, and the 3 o'clock position incision was slightly  widened, using the same 2.75 mm blade that was initially used to make the  incision.  An intraocular lens was placed in the shooter,  and this was  placed in the eye, followed by placement of the trailing haptic into the  posterior capsule, using the Kugelan.  Irrigation/aspiration was then used  to remove Provisc from the anterior chamber and the posterior capsule.  BSS  on a syringe was then used to hydrate the cornea at the 3 o'clock incision  site.  The incision site was then checked for water tightness, using a Weck-  cel.  Half-strength Betadine solution was placed, 1 drop, in the inner  canthus, and 1 drop in the outer canthus.  After one minute, this was rinsed  from the eye.  Drops were placed in the eye, Vigamox, followed by Nevanac  followed by Econopred.  A shield was placed over the patient's left eye, and  the patient was sent to the recovery room in satisfactory condition.      Trish Fountain, MD  Electronically Signed     PVK/MEDQ  D:  10/25/2005  T:  10/25/2005  Job:  [id number]

## 2013-05-10 ENCOUNTER — Observation Stay (HOSPITAL_COMMUNITY)
Admission: EM | Admit: 2013-05-10 | Discharge: 2013-05-12 | Disposition: A | Discharge status: Home / Self Care | Payer: Medicare Other | Attending: Internal Medicine | Admitting: Internal Medicine

## 2013-05-10 ENCOUNTER — Encounter (HOSPITAL_COMMUNITY): Payer: Self-pay | Admitting: *Deleted

## 2013-05-10 DIAGNOSIS — I10 Essential (primary) hypertension: Secondary | ICD-10-CM | POA: Diagnosis present

## 2013-05-10 DIAGNOSIS — R9431 Abnormal electrocardiogram [ECG] [EKG]: Secondary | ICD-10-CM | POA: Insufficient documentation

## 2013-05-10 DIAGNOSIS — R221 Localized swelling, mass and lump, neck: Secondary | ICD-10-CM | POA: Insufficient documentation

## 2013-05-10 DIAGNOSIS — R220 Localized swelling, mass and lump, head: Secondary | ICD-10-CM | POA: Insufficient documentation

## 2013-05-10 DIAGNOSIS — D72829 Elevated white blood cell count, unspecified: Secondary | ICD-10-CM | POA: Insufficient documentation

## 2013-05-10 DIAGNOSIS — E039 Hypothyroidism, unspecified: Secondary | ICD-10-CM | POA: Diagnosis present

## 2013-05-10 DIAGNOSIS — R209 Unspecified disturbances of skin sensation: Secondary | ICD-10-CM | POA: Insufficient documentation

## 2013-05-10 DIAGNOSIS — E876 Hypokalemia: Secondary | ICD-10-CM

## 2013-05-10 DIAGNOSIS — L0390 Cellulitis, unspecified: Secondary | ICD-10-CM

## 2013-05-10 DIAGNOSIS — M79609 Pain in unspecified limb: Secondary | ICD-10-CM | POA: Insufficient documentation

## 2013-05-10 DIAGNOSIS — J449 Chronic obstructive pulmonary disease, unspecified: Secondary | ICD-10-CM | POA: Diagnosis present

## 2013-05-10 DIAGNOSIS — C189 Malignant neoplasm of colon, unspecified: Secondary | ICD-10-CM | POA: Insufficient documentation

## 2013-05-10 DIAGNOSIS — J4489 Other specified chronic obstructive pulmonary disease: Secondary | ICD-10-CM | POA: Diagnosis present

## 2013-05-10 DIAGNOSIS — L0291 Cutaneous abscess, unspecified: Principal | ICD-10-CM | POA: Insufficient documentation

## 2013-05-10 DIAGNOSIS — K219 Gastro-esophageal reflux disease without esophagitis: Secondary | ICD-10-CM | POA: Diagnosis present

## 2013-05-10 DIAGNOSIS — M25432 Effusion, left wrist: Secondary | ICD-10-CM

## 2013-05-10 DIAGNOSIS — E871 Hypo-osmolality and hyponatremia: Secondary | ICD-10-CM

## 2013-05-10 DIAGNOSIS — M24232 Disorder of ligament, left wrist: Secondary | ICD-10-CM

## 2013-05-10 DIAGNOSIS — R011 Cardiac murmur, unspecified: Secondary | ICD-10-CM | POA: Diagnosis present

## 2013-05-10 DIAGNOSIS — E785 Hyperlipidemia, unspecified: Secondary | ICD-10-CM | POA: Diagnosis present

## 2013-05-10 DIAGNOSIS — M19039 Primary osteoarthritis, unspecified wrist: Secondary | ICD-10-CM | POA: Insufficient documentation

## 2013-05-10 HISTORY — DX: Essential (primary) hypertension: I10

## 2013-05-10 HISTORY — DX: Hypothyroidism, unspecified: E03.9

## 2013-05-10 HISTORY — DX: Unspecified osteoarthritis, unspecified site: M19.90

## 2013-05-10 LAB — CBC WITH DIFFERENTIAL/PLATELET
Basophils Relative: 0 % (ref 0–1)
Hemoglobin: 12.4 g/dL (ref 12.0–15.0)
Lymphocytes Relative: 11 % — ABNORMAL LOW (ref 12–46)
MCHC: 35.6 g/dL (ref 30.0–36.0)
Monocytes Relative: 9 % (ref 3–12)
Neutro Abs: 10.5 10*3/uL — ABNORMAL HIGH (ref 1.7–7.7)
Neutrophils Relative %: 80 % — ABNORMAL HIGH (ref 43–77)
RBC: 4.4 MIL/uL (ref 3.87–5.11)
WBC: 13.1 10*3/uL — ABNORMAL HIGH (ref 4.0–10.5)

## 2013-05-10 LAB — COMPREHENSIVE METABOLIC PANEL
ALT: 15 U/L (ref 0–35)
BUN: 6 mg/dL (ref 6–23)
CO2: 27 mEq/L (ref 19–32)
Calcium: 9.3 mg/dL (ref 8.4–10.5)
Creatinine, Ser: 0.77 mg/dL (ref 0.50–1.10)
GFR calc Af Amer: 89 mL/min — ABNORMAL LOW (ref >90)
GFR calc non Af Amer: 77 mL/min — ABNORMAL LOW (ref >90)
Glucose, Bld: 104 mg/dL — ABNORMAL HIGH (ref 70–99)
Total Protein: 7.6 g/dL (ref 6.0–8.3)

## 2013-05-10 LAB — CREATININE, SERUM: GFR calc Af Amer: >90 mL/min (ref >90)

## 2013-05-10 MED ORDER — ACETAMINOPHEN 650 MG RE SUPP: 650.0000 mg | Freq: Four times a day (QID) | RECTAL | Status: DC | PRN

## 2013-05-10 MED ORDER — CEFAZOLIN SODIUM 1-5 GM-% IV SOLN
1.0000 g | Freq: Once | INTRAVENOUS | Status: AC
  Administered 2013-05-10: 1 g via INTRAVENOUS
  Filled 2013-05-10: qty 50

## 2013-05-10 MED ORDER — POTASSIUM CHLORIDE CRYS ER 20 MEQ PO TBCR
40.0000 meq | EXTENDED_RELEASE_TABLET | Freq: Once | ORAL | Status: AC
  Administered 2013-05-10: 40 meq via ORAL
  Filled 2013-05-10: qty 2

## 2013-05-10 MED ORDER — ACETAMINOPHEN 325 MG PO TABS
650.0000 mg | ORAL_TABLET | Freq: Four times a day (QID) | ORAL | Status: DC | PRN
  Administered 2013-05-10: 650 mg via ORAL
  Filled 2013-05-10: qty 2

## 2013-05-10 MED ORDER — CHOLECALCIFEROL 10 MCG (400 UNIT) PO TABS
400.0000 [IU] | ORAL_TABLET | Freq: Two times a day (BID) | ORAL | Status: DC
  Administered 2013-05-10 – 2013-05-12 (×4): 400 [IU] via ORAL
  Filled 2013-05-10 (×5): qty 1

## 2013-05-10 MED ORDER — SODIUM CHLORIDE 0.9 % IJ SOLN: 3.0000 mL | INTRAMUSCULAR | Status: DC | PRN

## 2013-05-10 MED ORDER — PANTOPRAZOLE SODIUM 20 MG PO TBEC
40.0000 mg | DELAYED_RELEASE_TABLET | Freq: Every day | ORAL | Status: DC
  Filled 2013-05-10: qty 2

## 2013-05-10 MED ORDER — SODIUM CHLORIDE 0.9 % IV SOLN
INTRAVENOUS | Status: DC
  Administered 2013-05-10: 20 mL/h via INTRAVENOUS

## 2013-05-10 MED ORDER — PANTOPRAZOLE SODIUM 40 MG PO TBEC
40.0000 mg | DELAYED_RELEASE_TABLET | Freq: Every day | ORAL | Status: DC
  Administered 2013-05-10 – 2013-05-12 (×3): 40 mg via ORAL
  Filled 2013-05-10 (×3): qty 1

## 2013-05-10 MED ORDER — ACETAMINOPHEN 325 MG PO TABS
650.0000 mg | ORAL_TABLET | Freq: Once | ORAL | Status: AC
  Administered 2013-05-10: 650 mg via ORAL
  Filled 2013-05-10: qty 2

## 2013-05-10 MED ORDER — HEPARIN SODIUM (PORCINE) 5000 UNIT/ML IJ SOLN
5000.0000 [IU] | Freq: Three times a day (TID) | INTRAMUSCULAR | Status: DC
  Administered 2013-05-10 – 2013-05-12 (×5): 5000 [IU] via SUBCUTANEOUS
  Filled 2013-05-10 (×9): qty 1

## 2013-05-10 MED ORDER — SODIUM CHLORIDE 0.9 % IJ SOLN
3.0000 mL | Freq: Two times a day (BID) | INTRAMUSCULAR | Status: DC
  Administered 2013-05-10 – 2013-05-11 (×2): 3 mL via INTRAVENOUS

## 2013-05-10 MED ORDER — ASPIRIN 81 MG PO CHEW
81.0000 mg | CHEWABLE_TABLET | Freq: Every day | ORAL | Status: DC
  Administered 2013-05-10 – 2013-05-12 (×3): 81 mg via ORAL
  Filled 2013-05-10 (×5): qty 1

## 2013-05-10 MED ORDER — LORAZEPAM 0.5 MG PO TABS
0.5000 mg | ORAL_TABLET | Freq: Two times a day (BID) | ORAL | Status: DC
  Administered 2013-05-10 – 2013-05-12 (×4): 0.5 mg via ORAL
  Filled 2013-05-10 (×4): qty 1

## 2013-05-10 MED ORDER — CILOSTAZOL 100 MG PO TABS
100.0000 mg | ORAL_TABLET | Freq: Two times a day (BID) | ORAL | Status: DC
  Administered 2013-05-10 – 2013-05-12 (×4): 100 mg via ORAL
  Filled 2013-05-10 (×5): qty 1

## 2013-05-10 MED ORDER — METOPROLOL TARTRATE 100 MG PO TABS
100.0000 mg | ORAL_TABLET | Freq: Two times a day (BID) | ORAL | Status: DC
Start: 2013-05-10 — End: 2013-05-12
  Administered 2013-05-10 – 2013-05-12 (×4): 100 mg via ORAL
  Filled 2013-05-10 (×5): qty 1

## 2013-05-10 MED ORDER — CLINDAMYCIN PHOSPHATE 600 MG/50ML IV SOLN
600.0000 mg | Freq: Three times a day (TID) | INTRAVENOUS | Status: DC
  Administered 2013-05-10 – 2013-05-11 (×3): 600 mg via INTRAVENOUS
  Filled 2013-05-10 (×5): qty 50

## 2013-05-10 MED ORDER — SODIUM CHLORIDE 0.9 % IV SOLN: 250.0000 mL | INTRAVENOUS | Status: DC | PRN

## 2013-05-10 MED ORDER — LEVOTHYROXINE SODIUM 100 MCG PO TABS
100.0000 ug | ORAL_TABLET | Freq: Every day | ORAL | Status: DC
  Administered 2013-05-11 – 2013-05-12 (×2): 100 ug via ORAL
  Filled 2013-05-10 (×4): qty 1

## 2013-05-10 MED ORDER — FELODIPINE ER 10 MG PO TB24
10.0000 mg | ORAL_TABLET | Freq: Every day | ORAL | Status: DC
  Administered 2013-05-10 – 2013-05-12 (×3): 10 mg via ORAL
  Filled 2013-05-10 (×3): qty 1

## 2013-05-10 NOTE — ED Notes (Signed)
Pt is here with left hand redness swelling that is below wrist to hands.  Pt states she has some cysts to inner wrist.  Pulse present.  No fever

## 2013-05-10 NOTE — Progress Notes (Signed)
Unit CM UR Completed by [facility] ED CM  W. Wiatt Mahabir RN  

## 2013-05-10 NOTE — H&P (Signed)
Date: 05/10/2013               Patient Name:  [name] MRN: [id number]  DOB: 11/15/1929 Age / Sex: 79 y.o., female   PCP: Avon Gully, MD         Medical Service: Internal Medicine Teaching Service         Attending Physician: Dr. Kem Kays    First Contact: Dr. Shirlee Latch  Pager: [phone]  Second Contact: Dr. Clyde Lundborg Pager: [phone]       After Hours (After 5p/  First Contact Pager: [phone]  weekends / holidays): Second Contact Pager: [phone]   Chief Complaint: left hand pain, pinkness, swelling, decreased range of motion  History of Present Illness:  79 y.o PMH HTN, colon cancer, arthritis, hypothyroidism.  She presents for left hand pain, pinkness (dorsal surface), increased swelling, and decreased range of motion and use.  Of note the patient is left handed.  Symptoms have been going on since Saturday prior to admission.  Pain is 8/10 today after Tylenol in the ED pain is 7/10.  She also had associated numbness and stinging in her left hand.  She tried warm water and medicated powder over the counter without relief.  Her pain kept her up all night.  Movement makes the pain in her left hand/wrist worse.  She also had associated itching in the area.  She has had two knots or cysts in her left wrist for greater than or equal to two weeks and at least present for 1-2 years.  She states she has a history of arthritis or carpal tunnel.  She denies denies fever/chills, trauma or insect bite to affected area.  Meds: Xanax 0.5 bid prn  Celebrex 100 mg qd  Vitamin D 400 u bid  Pletal 100 mg bid  Felodipine 10 mg qd  Lasix 40 mg bid  Prevacid 30 mg qd  Synthyroid 100 mcg before breakfast  Lopressor 100 mg bid  Kdur 20 mg qd  Aspirin 81 mg qd   Allergies: Allergies as of 05/10/2013 - Review Complete 05/10/2013  Allergen Reaction Noted  . Amoxicillin Other (See Comments) 12/23/2006  . Penicillins Other (See Comments) 12/23/2006   Past Medical History  Diagnosis Date  . Hypertension     . Cancer     colon 2009 or 2010  . Arthritis   . Hypothyroidism    Past Surgical History  Procedure Laterality Date  . Cervical fusion    . Abdominal hysterectomy    . Joint replacement      bilateral knee replacement, left shoulder surgery  . Colon resection    . Carpal tunnel release     History reviewed. No pertinent family history. History   Social History  . Marital Status: Widowed    Spouse Name: N/A    Number of Children: N/A  . Years of Education: N/A   Occupational History  . Not on file.   Social History Main Topics  . Smoking status: Never Smoker   . Smokeless tobacco: Not on file  . Alcohol Use: Not on file  . Drug Use: No  . Sexually Active: Not on file   Other Topics Concern  . Not on file   Social History Narrative  . No narrative on file    Review of Systems: General: +decreased appetite, denies fever/chills HEENT: +mouth irritation Cardiac: denies chest pain  Pulm: denies sob Abd/GU: +constipation, denies GU blood, denies abdominal pain Ext: denies lower extremity edema but chronic lower  extremity edema, +left hand/wrist swelling since Saturday prior to admission Neuro: +numbness left hand  Skin: +pink color left dorsal hand and itching  Psych: +anxiety    Physical Exam: Blood pressure 169/80, pulse 100, temperature 98 F (36.7 C), temperature source Oral, resp. rate 20, SpO2 96.00%. Vitals reviewed. General: resting in bed, NAD, pleasant, alert and oriented x 3  HEENT: [place]/at, no scleral icterus Cardiac: RRR, no rubs, +systolic murmur Pulm: clear to auscultation bilaterally, no wheezes, rales, or rhonchi Abd: soft, nontender, nondistended, BS present, obese Ext: warm and well perfused, 1+ edema lower extremities b/l; left dorsal wrist/hand: with min. Pinkness, 2+ edema to left hand, decreased ROM with hyperextension of fingers, left wrist with multiple palpable areas, +warmth to dorsal hand Neuro: alert and oriented X3, grossly  neurologically intact, moving all 4 extremities    Lab results: Basic Metabolic Panel:  Recent Labs  40/98/11 1410  NA 127*  K 2.8*  CL 87*  CO2 27  GLUCOSE 104*  BUN 6  CREATININE 0.77  CALCIUM 9.3  MG 1.8   Liver Function Tests:  Recent Labs  05/10/13 1410  AST 20  ALT 15  ALKPHOS 52  BILITOT 0.7  PROT 7.6  ALBUMIN 3.5   CBC:  Recent Labs  05/10/13 1410  WBC 13.1*  NEUTROABS 10.5*  HGB 12.4  HCT 34.8*  MCV 79.1  PLT 332     Misc. Labs: Blood cultures Magnesium    Imaging results:  No results found.  Other results: EKG: pending   Assessment & Plan by Problem: Active Problems:   HYPERLIPIDEMIA   HYPERTENSION   COPD   GERD   CARDIAC MURMUR   Hypothyroidism 79 y.o female presents for left hand pain, pinkness (dorsal surface), increased swelling, and decreased range of motion and use thought likely to be cellulitis  1. Left hand/wrist cellulitis  -Noted with erythema, edema, and warmth on exam clinically.  Patient is afebrile but has leukocytosis.  Given Ancef in the ED and Tylenol for pain  -prn Tylenol 650 mg q 6 prn pain  -ordered Cleocin 600 mg iv tid. Consider transition to oral in the am -pending blood cultures   2. Leukocytosis  -WBC 13.1.  Could be secondary to cellulitis -will trend CBC.    3. History of HTN -Monitor VS. BP on admission 130/78 -continue Aspirin 81 mg qd, Felodipine 10 mg qd, Lopressor 100 mg bid  -increased pulse pressure can be seen with increasing age and is good predictor of CVD in elderly.  Pending EKG  4. History of Hypothyroidism -continue Synthyroid 100 mcg qam  5. History of COPD -No on home O2. No acute issues   6. Anxiety -Taking Xanax prior to admission will change to Ativan 0.5 mg bid with ultimate goal to wean off   7. F/E/N -NSL -Hyponatremia 127.  Could be secondary to diuretic use.  Will hold Lasix home dose for now and monitor BMET  -Hypokalemia 2.8-given 40 meQ K in the Ed.  Will check  Mag.  Could be secondary to decreased oral intake or diuretic use.  -cardiac diet   8.DVT px  -heparin   Dispo: Disposition is deferred at this time, awaiting improvement of current medical problems. Anticipated discharge in approximately 1 day(s).   The patient does have a current PCP (Avon Gully, MD) and does not need an [facility] hospital follow-up appointment after discharge.  The patient does not have transportation limitations that hinder transportation to clinic appointments.  Signed: Pasty Spillers  Shirlee Latch, MD [phone] 05/10/2013, 6:37 PM

## 2013-05-10 NOTE — ED Provider Notes (Signed)
History     CSN: [id number]  Arrival date & time 05/10/13  1023   First MD Initiated Contact with Patient 05/10/13 1220      Chief Complaint  Patient presents with  . Hand Pain    (Consider location/radiation/quality/duration/timing/severity/associated sxs/prior treatment) Patient is a 79 y.o. female presenting with hand pain. The history is provided by the patient and a relative.  Hand Pain Pertinent negatives include no chest pain, no abdominal pain, no headaches and no shortness of breath.  pt c/o left hand pain, redness, and swelling for the past couple days. Gradual onset. Constant. Mild/dull. No specific exacerbating or alleviating factors. Denies same symptoms previously. States does not feel ill/sick. No nv. No fever or chills. Denies any injury or fall. No numbness/weakness. States had long hx two 'cysts' to wrist, but never required tx or drainage. Denies bite or sting. No other joint or extremity pain or swelling.   Past Medical History  Diagnosis Date  . Hypertension   . Cancer     Past Surgical History  Procedure Laterality Date  . Cervical fusion    . Abdominal hysterectomy    . Joint replacement      knee replacement, shoulder surgery  . Colon resection    . Carpal tunnel release      History reviewed. No pertinent family history.  History  Substance Use Topics  . Smoking status: Never Smoker   . Smokeless tobacco: Not on file  . Alcohol Use: Not on file    OB History   Grav Para Term Preterm Abortions TAB SAB Ect Mult Living                  Review of Systems  Constitutional: Negative for fever and chills.  HENT: Negative for neck pain.   Eyes: Negative for redness.  Respiratory: Negative for cough and shortness of breath.   Cardiovascular: Negative for chest pain.  Gastrointestinal: Negative for nausea, vomiting and abdominal pain.  Genitourinary: Negative for flank pain.  Musculoskeletal: Negative for back pain.  Skin: Negative for wound.   Neurological: Negative for weakness, numbness and headaches.  Hematological: Does not bruise/bleed easily.  Psychiatric/Behavioral: Negative for confusion.    Allergies  Amoxicillin and Penicillins  Home Medications   Current Outpatient Rx  Name  Route  Sig  Dispense  Refill  . ALPRAZolam (XANAX) 0.5 MG tablet   Oral   Take 0.5 mg by mouth 2 (two) times daily.         . celecoxib (CELEBREX) 100 MG capsule   Oral   Take 100 mg by mouth daily.         . cholecalciferol (VITAMIN D) 400 UNITS TABS   Oral   Take 400 Units by mouth 2 (two) times daily.         . cilostazol (PLETAL) 100 MG tablet   Oral   Take 100 mg by mouth 2 (two) times daily.         . felodipine (PLENDIL) 10 MG 24 hr tablet   Oral   Take 10 mg by mouth daily.         . furosemide (LASIX) 40 MG tablet   Oral   Take 40 mg by mouth 2 (two) times daily.         . lansoprazole (PREVACID) 30 MG capsule   Oral   Take 30 mg by mouth daily.         Marland Kitchen levothyroxine (SYNTHROID, LEVOTHROID) 100 MCG tablet  Oral   Take 100 mcg by mouth daily before breakfast.         . metoprolol (LOPRESSOR) 100 MG tablet   Oral   Take 100 mg by mouth 2 (two) times daily.         . potassium chloride SA (K-DUR,KLOR-CON) 20 MEQ tablet   Oral   Take 20 mEq by mouth daily.         Marland Kitchen aspirin 81 MG chewable tablet   Oral   Chew 81 mg by mouth daily.           BP 154/95  Pulse 98  Temp(Src) 98 F (36.7 C) (Oral)  Resp 20  SpO2 97%  Physical Exam  Nursing note and vitals reviewed. Constitutional: She appears well-developed and well-nourished. No distress.  HENT:  Nose: Nose normal.  Mouth/Throat: Oropharynx is clear and moist.  Eyes: Conjunctivae are normal. No scleral icterus.  Neck: Neck supple. No tracheal deviation present.  Cardiovascular: Normal rate, regular rhythm, normal heart sounds and intact distal pulses.   Pulmonary/Chest: Effort normal and breath sounds normal. No respiratory  distress.  Abdominal: Soft. Normal appearance and bowel sounds are normal. She exhibits no distension. There is no tenderness.  Genitourinary:  No cva tenderness  Musculoskeletal: She exhibits no edema and no tenderness.  Neurological: She is alert.  Skin: Skin is warm and dry. No rash noted.  Psychiatric: She has a normal mood and affect.    ED Course  Procedures (including critical care time)   Results for orders placed during the hospital encounter of 05/10/13  COMPREHENSIVE METABOLIC PANEL      Result Value Range   Sodium 127 (*) 135 - 145 mEq/L   Potassium 2.8 (*) 3.5 - 5.1 mEq/L   Chloride 87 (*) 96 - 112 mEq/L   CO2 27  19 - 32 mEq/L   Glucose, Bld 104 (*) 70 - 99 mg/dL   BUN 6  6 - 23 mg/dL   Creatinine, Ser 0.27  0.50 - 1.10 mg/dL   Calcium 9.3  8.4 - 25.3 mg/dL   Total Protein 7.6  6.0 - 8.3 g/dL   Albumin 3.5  3.5 - 5.2 g/dL   AST 20  0 - 37 U/L   ALT 15  0 - 35 U/L   Alkaline Phosphatase 52  39 - 117 U/L   Total Bilirubin 0.7  0.3 - 1.2 mg/dL   GFR calc non Af Amer 77 (*) >90 mL/min   GFR calc Af Amer 89 (*) >90 mL/min  CBC WITH DIFFERENTIAL      Result Value Range   WBC 13.1 (*) 4.0 - 10.5 K/uL   RBC 4.40  3.87 - 5.11 MIL/uL   Hemoglobin 12.4  12.0 - 15.0 g/dL   HCT 66.4 (*) 40.3 - 47.4 %   MCV 79.1  78.0 - 100.0 fL   MCH 28.2  26.0 - 34.0 pg   MCHC 35.6  30.0 - 36.0 g/dL   RDW 25.9  56.3 - 87.5 %   Platelets 332  150 - 400 K/uL   Neutrophils Relative % 80 (*) 43 - 77 %   Neutro Abs 10.5 (*) 1.7 - 7.7 K/uL   Lymphocytes Relative 11 (*) 12 - 46 %   Lymphs Abs 1.4  0.7 - 4.0 K/uL   Monocytes Relative 9  3 - 12 %   Monocytes Absolute 1.1 (*) 0.1 - 1.0 K/uL   Eosinophils Relative 1  0 - 5 %  Eosinophils Absolute 0.1  0.0 - 0.7 K/uL   Basophils Relative 0  0 - 1 %   Basophils Absolute 0.0  0.0 - 0.1 K/uL       MDM  Iv ns. Confirmed only allergy is pcn. Pt notes rash/gi upset.  Ancef iv.  Labs.  Reviewed nursing notes and prior charts for  additional history.   Given cellulitis, hyponatremia, hypokalemia, will admit to med service for obs/iv abx, iv fluids.  Unassigned med service called to admit - discussed with resident on call - will admit.         Suzi Roots, MD 05/10/13 1537

## 2013-05-10 NOTE — ED Notes (Signed)
Patient has swelling to her left wrist and hand. Full movement, decreased sensation to her fingers. Patient states it suddenly swelled on Saturday but she has had cysts in her wrist for apprx a year.

## 2013-05-10 NOTE — ED Notes (Signed)
Called report to unit [id number]. Verified admitting wanted patient in Med Surg.

## 2013-05-10 NOTE — Progress Notes (Signed)
Patient is alert and oriented. She came from ED via stretcher. She does have some trace +1 edema to the left hand. Skin is intact. IV is located in the right forearm. Patient was oriented to unit. Bed is in lowest position. Bed alarm placed to help decrease falls and called bell within reach will continue to monitor.   Marcelyn Bruins RN

## 2013-05-11 ENCOUNTER — Observation Stay (HOSPITAL_COMMUNITY): Payer: Medicare Other

## 2013-05-11 LAB — BASIC METABOLIC PANEL
Chloride: 97 mEq/L (ref 96–112)
GFR calc Af Amer: >90 mL/min (ref >90)
GFR calc non Af Amer: 79 mL/min — ABNORMAL LOW (ref >90)
Glucose, Bld: 89 mg/dL (ref 70–99)
Potassium: 3.6 mEq/L (ref 3.5–5.1)
Sodium: 133 mEq/L — ABNORMAL LOW (ref 135–145)

## 2013-05-11 LAB — CBC
HCT: 33.3 % — ABNORMAL LOW (ref 36.0–46.0)
Hemoglobin: 11.3 g/dL — ABNORMAL LOW (ref 12.0–15.0)
MCHC: 33.9 g/dL (ref 30.0–36.0)
RBC: 4.15 MIL/uL (ref 3.87–5.11)

## 2013-05-11 MED ORDER — CLINDAMYCIN HCL 300 MG PO CAPS
450.0000 mg | ORAL_CAPSULE | Freq: Three times a day (TID) | ORAL | Status: DC
  Administered 2013-05-12: 450 mg via ORAL
  Filled 2013-05-11 (×4): qty 1

## 2013-05-11 MED ORDER — LIDOCAINE HCL (PF) 1 % IJ SOLN
10.0000 mL | Freq: Once | INTRAMUSCULAR | Status: AC
  Administered 2013-05-11: 10 mL
  Filled 2013-05-11: qty 10

## 2013-05-11 MED ORDER — ACETAMINOPHEN 325 MG PO TABS
ORAL_TABLET | ORAL | Status: AC
  Administered 2013-05-11: 650 mg
  Filled 2013-05-11: qty 2

## 2013-05-11 MED ORDER — CLINDAMYCIN HCL 300 MG PO CAPS
450.0000 mg | ORAL_CAPSULE | Freq: Every evening | ORAL | Status: AC
  Administered 2013-05-11: 450 mg via ORAL
  Filled 2013-05-11: qty 1

## 2013-05-11 MED ORDER — FUROSEMIDE 40 MG PO TABS
40.0000 mg | ORAL_TABLET | Freq: Every day | ORAL | Status: DC
  Administered 2013-05-11 – 2013-05-12 (×2): 40 mg via ORAL
  Filled 2013-05-11 (×2): qty 1

## 2013-05-11 NOTE — H&P (Signed)
INTERNAL MEDICINE TEACHING SERVICE Attending Admission Note  Date: 05/11/2013  Patient name: [name]  Medical record number: [id number]  Date of birth: 05-29-1929    I have seen and evaluated [name] and discussed their care with the Residency Team.  83 yr. Old WF w/ hx colon CA, OA, hypothyroidism, presented with left hand pain and swelling. She states her wrist started to swell a few weeks ago and she went to her PCP to have this evaluated. She believes she was told it was OA. She then noted decreased range on motion and redness/warmth over her dorsal hand.  Denies any trauma. She had a leukocytosis on admission. I would perform arthrocentesis of this joint. Concerning for underlying crystal arthropathy vs septic joint. Obtain plain films of wrist to r/o fracture.  BC pending. Agree with continued cleocin. This is improving this morning.  Jonah Blue, [place] 6/24/20141:07 PM

## 2013-05-11 NOTE — Progress Notes (Signed)
Subjective: Pain left hand pain is 3/10 today.  Left wrist is still swollen.  Denies other complaints today.   Objective: Vital signs in last 24 hours: Filed Vitals:   05/11/13 0538 05/11/13 0704 05/11/13 0932 05/11/13 1400  BP: 114/58  121/61 104/63  Pulse: 69  70 64  Temp: 98.2 F (36.8 C)   98.2 F (36.8 C)  TempSrc: Oral   Oral  Resp: 18   18  Height:      Weight:  174 lb 9.6 oz (79.198 kg)    SpO2: 92%   96%   Weight change:   Intake/Output Summary (Last 24 hours) at 05/11/13 1635 Last data filed at 05/11/13 1300  Gross per 24 hour  Intake    363 ml  Output      0 ml  Net    363 ml   Vitals reviewed. General: resting in bed, NAD HEENT: [place]/at,, no scleral icterus Cardiac: RRR, no rubs, +systolic murmurs Pulm: clear to auscultation bilaterally, no wheezes, rales, or rhonchi Abd: soft, nontender, nondistended, BS present, obese  Ext: warm and well perfused, no pedal edema. Left hand wrist with edema, improved ROM since admission Neuro: alert and oriented X3, moving all 4 extremities, grossly neurologically intact.   Lab Results: Basic Metabolic Panel:  Recent Labs Lab 05/10/13 1410 05/10/13 2235 05/11/13 0450  NA 127*  --  133*  K 2.8*  --  3.6  CL 87*  --  97  CO2 27  --  27  GLUCOSE 104*  --  89  BUN 6  --  6  CREATININE 0.77 0.69 0.71  CALCIUM 9.3  --  9.1  MG 1.8  --  1.9   Liver Function Tests:  Recent Labs Lab 05/10/13 1410  AST 20  ALT 15  ALKPHOS 52  BILITOT 0.7  PROT 7.6  ALBUMIN 3.5   CBC:  Recent Labs Lab 05/10/13 1410 05/11/13 0450  WBC 13.1* 8.7  NEUTROABS 10.5*  --   HGB 12.4 11.3*  HCT 34.8* 33.3*  MCV 79.1 80.2  PLT 332 316   Misc. Labs: none  Micro Results: No results found for this or any previous visit (from the past 240 hour(s)). Studies/Results: Dg Wrist Complete Left  05/11/2013   *RADIOLOGY REPORT*  Clinical Data: Left wrist pain, swelling, erythema, and warmth. Possible cellulitis.  Limited range of  motion.  No known injuries.  LEFT WRIST - COMPLETE 3+ VIEW  Comparison: None.  Findings: No evidence of acute or subacute fracture.  Slight widening of the joint space between the scaphoid and the lunate. Severe radiocarpal joint space narrowing and severe joint space narrowing involving the scaphoid - trapezium, scaphoid - trapezoid, trapezium - first metacarpal, and trapezoid - second metacarpal joints with associated hypertrophic spurring.  Moderate generalized osseous demineralization.  IMPRESSION: No acute or subacute osseous abnormality.  Severe osteoarthritis. Widening of the joint space between the scaphoid and the lunate raises the question of scapholunate ligament tear.   Original Report Authenticated By: Hulan Saas, M.D.   Medications:  Scheduled Meds: . aspirin  81 mg Oral Daily  . cholecalciferol  400 Units Oral BID  . cilostazol  100 mg Oral BID  . clindamycin (CLEOCIN) IV  600 mg Intravenous Q8H  . felodipine  10 mg Oral Daily  . furosemide  40 mg Oral Daily  . heparin  5,000 Units Subcutaneous Q8H  . levothyroxine  100 mcg Oral QAC breakfast  . LORazepam  0.5 mg Oral BID  .  metoprolol  100 mg Oral BID  . pantoprazole  40 mg Oral Daily  . sodium chloride  3 mL Intravenous Q12H   Continuous Infusions:  PRN Meds:.sodium chloride, acetaminophen, acetaminophen, sodium chloride Assessment/Plan: 79 y.o female presents for left hand pain, pinkness (dorsal surface), increased swelling, and decreased range of motion and use thought likely to be cellulitis   1. Left hand/wrist swelling -Noted with erythema, edema, and warmth on exam clinically appears like cellulitis.  Patient is afebrile but has leukocytosis resolved. Attempted to r/o gout with arthrocentesis.  Complete Xray indicates severe OA.  Given Ancef in the ED and Tylenol for pain  -prn Tylenol 650 mg q 6 prn pain  -ordered Cleocin 600 mg iv tid will transition to oral today 450 mg q8 hours discussed with  pharmacy -pending blood cultures   2. Leukocytosis, resolved   3. History of HTN  -Monitor VS. BP on admission 121/61 -continue Aspirin 81 mg qd, Felodipine 10 mg qd, Lopressor 100 mg bid  -increased pulse pressure can be seen with increasing age and is good predictor of CVD in elderly. Pending EKG   4. History of Hypothyroidism  -continue Synthyroid 100 mcg qam   5. History of COPD  -No on home O2. No acute issues   6. Anxiety  -Taking Xanax prior to admission will change to Ativan 0.5 mg bid with ultimate goal to wean off   7. F/E/N  -NSL  -Hyponatremia 127-->133. Could be secondary to diuretic use. Held Lasix home dose on admission 40 mg bid and resumed 40 mg qd -Hypokalemia 2.8 now 3.6. Could be secondary to decreased oral intake or diuretic use. Trend BMET -cardiac diet   8.DVT px  -heparin     Dispo: Disposition is deferred at this time, awaiting improvement of current medical problems.  Anticipated discharge in approximately 1 day(s).   The patient does have a current PCP (FANTA,TESFAYE, MD), therefore will be requiring OPC follow-up after discharge.   The patient does not have transportation limitations that hinder transportation to clinic appointments.  .Services Needed at time of discharge: Y = Yes, Blank = No PT:   OT:   RN:   Equipment:   Other:     LOS: 1 day   Annett Gula [phone] 05/11/2013, 4:35 PM

## 2013-05-11 NOTE — Procedures (Signed)
Left wrist arthrocentesis   After consent was obtained, using sterile technique (cleaned with 2 passes of Betadine Q tip and sterile gloves) and the left wrist was prepped and plain Lidocaine 1% (1.5-2 cc) was used as local anesthetic. The joint was entered with Lidocaine was then injected and the needle withdrawn.  No fluid was obtained with aspiration.  The procedure was well tolerated with no significant bleeding.  The patient is asked to continue to rest the joint for a few more days before resuming regular activities.  It may be more painful for the first 1-2 days.  Watch for fever, or increased swelling or persistent pain in the joint.   Shirlee Latch MD [phone] Supervised by Dr. Kem Kays

## 2013-05-12 DIAGNOSIS — M25439 Effusion, unspecified wrist: Secondary | ICD-10-CM

## 2013-05-12 DIAGNOSIS — M2420 Disorder of ligament, unspecified site: Secondary | ICD-10-CM

## 2013-05-12 LAB — BASIC METABOLIC PANEL
BUN: 5 mg/dL — ABNORMAL LOW (ref 6–23)
CO2: 30 mEq/L (ref 19–32)
Chloride: 93 mEq/L — ABNORMAL LOW (ref 96–112)
Creatinine, Ser: 0.82 mg/dL (ref 0.50–1.10)
Glucose, Bld: 97 mg/dL (ref 70–99)

## 2013-05-12 MED ORDER — FUROSEMIDE 40 MG PO TABS: 40.0000 mg | ORAL_TABLET | Freq: Every day | ORAL | Status: DC

## 2013-05-12 MED ORDER — POTASSIUM CHLORIDE CRYS ER 20 MEQ PO TBCR
40.0000 meq | EXTENDED_RELEASE_TABLET | Freq: Once | ORAL | Status: AC
  Administered 2013-05-12: 40 meq via ORAL
  Filled 2013-05-12: qty 2

## 2013-05-12 MED ORDER — LORAZEPAM 0.5 MG PO TABS: 0.5000 mg | ORAL_TABLET | Freq: Two times a day (BID) | ORAL | Status: DC

## 2013-05-12 NOTE — Progress Notes (Signed)
Subjective: No pain in left wrist increased ROM.  Denies other complaints  Objective: Vital signs in last 24 hours: Filed Vitals:   05/11/13 0932 05/11/13 1400 05/11/13 2048 05/12/13 0544  BP: 121/61 104/63 146/75 152/82  Pulse: 70 64 83 70  Temp:  98.2 F (36.8 C) 98.1 F (36.7 C) 98.3 F (36.8 C)  TempSrc:  Oral Oral Oral  Resp:  18 20 18   Height:      Weight:    167 lb (75.751 kg)  SpO2:  96% 95% 96%   Weight change: 1 lb 11.2 oz (0.771 kg)  Intake/Output Summary (Last 24 hours) at 05/12/13 0800 Last data filed at 05/11/13 2035  Gross per 24 hour  Intake    560 ml  Output      0 ml  Net    560 ml   Vitals reviewed. General: resting in bed, NAD HEENT: [place]/at,, no scleral icterus Cardiac: RRR, no rubs, +systolic murmurs Pulm: clear to auscultation bilaterally, no wheezes, rales, or rhonchi Abd: soft, nontender, nondistended, BS present, obese  Ext: warm and well perfused, no pedal edema. Left hand wrist with edema but improved, improved ROM since admission back to baseline function Neuro: alert and oriented X3, moving all 4 extremities, grossly neurologically intact.   Lab Results: Basic Metabolic Panel:  Recent Labs Lab 05/10/13 1410  05/11/13 0450 05/12/13 0604  NA 127*  --  133* 132*  K 2.8*  --  3.6 3.3*  CL 87*  --  97 93*  CO2 27  --  27 30  GLUCOSE 104*  --  89 97  BUN 6  --  6 5*  CREATININE 0.77  < > 0.71 0.82  CALCIUM 9.3  --  9.1 9.4  MG 1.8  --  1.9  --   < > = values in this interval not displayed. Liver Function Tests:  Recent Labs Lab 05/10/13 1410  AST 20  ALT 15  ALKPHOS 52  BILITOT 0.7  PROT 7.6  ALBUMIN 3.5   CBC:  Recent Labs Lab 05/10/13 1410 05/11/13 0450  WBC 13.1* 8.7  NEUTROABS 10.5*  --   HGB 12.4 11.3*  HCT 34.8* 33.3*  MCV 79.1 80.2  PLT 332 316   Misc. Labs: none  Micro Results: No results found for this or any previous visit (from the past 240 hour(s)). Studies/Results: Dg Wrist Complete  Left  05/11/2013   *RADIOLOGY REPORT*  Clinical Data: Left wrist pain, swelling, erythema, and warmth. Possible cellulitis.  Limited range of motion.  No known injuries.  LEFT WRIST - COMPLETE 3+ VIEW  Comparison: None.  Findings: No evidence of acute or subacute fracture.  Slight widening of the joint space between the scaphoid and the lunate. Severe radiocarpal joint space narrowing and severe joint space narrowing involving the scaphoid - trapezium, scaphoid - trapezoid, trapezium - first metacarpal, and trapezoid - second metacarpal joints with associated hypertrophic spurring.  Moderate generalized osseous demineralization.  IMPRESSION: No acute or subacute osseous abnormality.  Severe osteoarthritis. Widening of the joint space between the scaphoid and the lunate raises the question of scapholunate ligament tear.   Original Report Authenticated By: Hulan Saas, M.D.   Medications:  Scheduled Meds: . aspirin  81 mg Oral Daily  . cholecalciferol  400 Units Oral BID  . cilostazol  100 mg Oral BID  . clindamycin  450 mg Oral Q8H  . felodipine  10 mg Oral Daily  . furosemide  40 mg Oral Daily  .  heparin  5,000 Units Subcutaneous Q8H  . levothyroxine  100 mcg Oral QAC breakfast  . LORazepam  0.5 mg Oral BID  . metoprolol  100 mg Oral BID  . pantoprazole  40 mg Oral Daily  . potassium chloride  40 mEq Oral Once  . sodium chloride  3 mL Intravenous Q12H   Continuous Infusions:  PRN Meds:.sodium chloride, acetaminophen, acetaminophen, sodium chloride Assessment/Plan: 79 y.o female presents for left hand pain, pinkness (dorsal surface), increased swelling, and decreased range of motion and use thought likely to be cellulitis   1. Left hand/wrist swelling -Noted with erythema, edema, and warmth on exam clinically appears like cellulitis.  Patient is afebrile but has leukocytosis resolved. Attempted to r/o gout with arthrocentesis but no fluid obtained 6/24.  Complete Xray indicates severe OA  and possibly ligament tear.  Given Ancef in the ED and Tylenol for pain  -prn Tylenol 650 mg q 6 prn pain  -Cleocin 450 mg q8 po hours discussed with pharmacy -pending blood cultures   2. Leukocytosis, resolved   3. History of HTN  -Monitor VS. BP on admission 152/82 -continue Aspirin 81 mg qd, Felodipine 10 mg qd, Lopressor 100 mg bid  -increased pulse pressure can be seen with increasing age and is good predictor of CVD in elderly. Pending EKG   4. History of Hypothyroidism  -continue Synthyroid 100 mcg qam   5. History of COPD  -No on home O2. No acute issues   6. Anxiety  -Taking Xanax prior to admission will change to Ativan 0.5 mg bid with ultimate goal to wean off   7. F/E/N  -NSL  -Hyponatremia 127-->132. Could be secondary to diuretic use. Held Lasix home dose on admission 40 mg bid and resumed 40 mg qd instead of bid -Hypokalemia 2.8 now 3.3. Replaced K.  Could be secondary to decreased oral intake or diuretic use. Trend BMET outpatient -cardiac diet   8.DVT px  -heparin     Dispo: D/c today with outpatient f/u with PCP  The patient does have a current PCP (FANTA,TESFAYE, MD), therefore will be requiring OPC follow-up after discharge.   The patient does not have transportation limitations that hinder transportation to clinic appointments.  .Services Needed at time of discharge: Y = Yes, Blank = No PT:   OT:   RN:   Equipment:   Other:     LOS: 2 days   [name] [phone] 05/12/2013, 8:00 AM

## 2013-05-12 NOTE — Progress Notes (Signed)
NURSING PROGRESS NOTE  [name] [id number] Discharge Data: 05/12/2013 4:47 PM Attending Provider: No att. providers found XBJ:YNWGN,FAOZHYQ, MD     Abigail Miyamoto Dolliver to be D/C'd Home per MD order.  Discussed with the patient the After Visit Summary and all questions fully answered. All IV's discontinued with no bleeding noted. All belongings returned to patient for patient to take home.   Last Vital Signs:  Blood pressure 152/82, pulse 70, temperature 98.3 F (36.8 C), temperature source Oral, resp. rate 18, height 5\' 1"  (1.549 m), weight 75.751 kg (167 lb), SpO2 96.00%.  Discharge Medication List   Medication List    STOP taking these medications       ALPRAZolam 0.5 MG tablet  Commonly known as:  XANAX      TAKE these medications       aspirin 81 MG chewable tablet  Chew 81 mg by mouth daily.     celecoxib 100 MG capsule  Commonly known as:  CELEBREX  Take 100 mg by mouth daily.     cholecalciferol 400 UNITS Tabs  Commonly known as:  VITAMIN D  Take 400 Units by mouth 2 (two) times daily.     cilostazol 100 MG tablet  Commonly known as:  PLETAL  Take 100 mg by mouth 2 (two) times daily.     felodipine 10 MG 24 hr tablet  Commonly known as:  PLENDIL  Take 10 mg by mouth daily.     furosemide 40 MG tablet  Commonly known as:  LASIX  Take 1 tablet (40 mg total) by mouth daily.     lansoprazole 30 MG capsule  Commonly known as:  PREVACID  Take 30 mg by mouth daily.     levothyroxine 100 MCG tablet  Commonly known as:  SYNTHROID, LEVOTHROID  Take 100 mcg by mouth daily before breakfast.     LORazepam 0.5 MG tablet  Commonly known as:  ATIVAN  Take 1 tablet (0.5 mg total) by mouth 2 (two) times daily.     metoprolol 100 MG tablet  Commonly known as:  LOPRESSOR  Take 100 mg by mouth 2 (two) times daily.     potassium chloride SA 20 MEQ tablet  Commonly known as:  K-DUR,KLOR-CON  Take 20 mEq by mouth daily.

## 2013-05-12 NOTE — Progress Notes (Signed)
  Date: 05/12/2013  Patient name: Vanette Noguchi Derksen  Medical record number: [id number]  Date of birth: 1929-05-16   This patient has been seen and the plan of care was discussed with the house staff. Please see their note for complete details. I concur with their findings with the following additions/corrections:  Improved. Her wrist xray shows likely evidence of a small ligamental tear, which likely explains her swelling. This has resolved. She has severe OA. The cellulitis that subsequently developed is much improved. She needs wrist splint and outpatient ortho follow up. Wrist arthrocentesis yesterday without evidence of joint effusion, I do not suspect septic joint. Stable for D/C.  Jonah Blue, DO 05/12/2013, 4:14 PM

## 2013-05-12 NOTE — Progress Notes (Signed)
Orthopedic Tech Progress Note Patient Details:  [name] 1929/02/05 [id number]  Ortho Devices Type of Ortho Device: Wrist splint Ortho Device/Splint Interventions: Application   Cammer, Mickie Bail 05/12/2013, 10:28 AM

## 2013-05-12 NOTE — Procedures (Signed)
I was present for entire procedure. Informed consent obtained. No complications. Patient tolerated procedure well.

## 2013-05-13 NOTE — Discharge Summary (Signed)
Name: [name] MRN: [id number] DOB: 05/12/1929 79 y.o. PCP: Avon Gully, MD  Date of Admission: 05/10/2013 11:48 AM Date of Discharge: 05/12/2013 Attending Physician: Dr. Kem Kays  Discharge Diagnosis: 1. Left hand/wrist swelling  2. Leukocytosis 3. History of Hypertension  4. History of Hypothyroidism  5. History of COPD (per chart review) 6. Anxiety  7. Hyponatremia  8. Hypokalemia  Discharge Medications:   Medication List    STOP taking these medications       ALPRAZolam 0.5 MG tablet  Commonly known as:  XANAX      TAKE these medications       aspirin 81 MG chewable tablet  Chew 81 mg by mouth daily.     celecoxib 100 MG capsule  Commonly known as:  CELEBREX  Take 100 mg by mouth daily.     cholecalciferol 400 UNITS Tabs  Commonly known as:  VITAMIN D  Take 400 Units by mouth 2 (two) times daily.     cilostazol 100 MG tablet  Commonly known as:  PLETAL  Take 100 mg by mouth 2 (two) times daily.     felodipine 10 MG 24 hr tablet  Commonly known as:  PLENDIL  Take 10 mg by mouth daily.     furosemide 40 MG tablet  Commonly known as:  LASIX  Take 1 tablet (40 mg total) by mouth daily.     lansoprazole 30 MG capsule  Commonly known as:  PREVACID  Take 30 mg by mouth daily.     levothyroxine 100 MCG tablet  Commonly known as:  SYNTHROID, LEVOTHROID  Take 100 mcg by mouth daily before breakfast.     LORazepam 0.5 MG tablet  Commonly known as:  ATIVAN  Take 1 tablet (0.5 mg total) by mouth 2 (two) times daily.     metoprolol 100 MG tablet  Commonly known as:  LOPRESSOR  Take 100 mg by mouth 2 (two) times daily.     potassium chloride SA 20 MEQ tablet  Commonly known as:  K-DUR,KLOR-CON  Take 20 mEq by mouth daily.        Disposition and follow-up:   Ms.[name] was discharged from [facility] in stable condition.  At the hospital follow up visit please address:  1.   -Wean off benzodiazepines -refer to  orthopedics for possible ligament tear  2.  Labs / imaging needed at time of follow-up:  -BMET  3.  Pending labs/ test needing follow-up: final blood culture  Follow-up Appointments:   Discharge Instructions:     Discharge Orders   Future Orders Complete By Expires     Discharge instructions  As directed     Comments:      Please call and reschedule Dr. Felecia Shelling appt [phone] appointment scheduled 05/25/13 at 3:30. You will need to go to a orthopedic doctor   Take Tylenol for pain if needed  Take Lasix 40 mg daily  Take Potassium 20 meQ daily   Wear wrist splint you can take if off at night and try not to use your left wrist for heavy lifting    Increase activity slowly  As directed        Consultations: none  Procedures Performed:  Dg Wrist Complete Left  05/11/2013   *RADIOLOGY REPORT*  Clinical Data: Left wrist pain, swelling, erythema, and warmth. Possible cellulitis.  Limited range of motion.  No known injuries.  LEFT WRIST - COMPLETE 3+ VIEW  Comparison: None.  Findings:  No evidence of acute or subacute fracture.  Slight widening of the joint space between the scaphoid and the lunate. Severe radiocarpal joint space narrowing and severe joint space narrowing involving the scaphoid - trapezium, scaphoid - trapezoid, trapezium - first metacarpal, and trapezoid - second metacarpal joints with associated hypertrophic spurring.  Moderate generalized osseous demineralization.  IMPRESSION: No acute or subacute osseous abnormality.  Severe osteoarthritis. Widening of the joint space between the scaphoid and the lunate raises the question of scapholunate ligament tear.   Original Report Authenticated By: Hulan Saas, M.D.    2D Echo: none  Cardiac Cath: none  Admission HPI:   Chief Complaint: left hand pain, pinkness, swelling, decreased range of motion  History of Present Illness:  79 y.o PMH HTN, colon cancer, arthritis, hypothyroidism. She presents for left hand pain,  pinkness (dorsal surface), increased swelling, and decreased range of motion and use. Of note the patient is left handed. Symptoms have been going on since Saturday prior to admission. Pain is 8/10 today after Tylenol in the ED pain is 7/10. She also had associated numbness and stinging in her left hand. She tried warm water and medicated powder over the counter without relief. Her pain kept her up all night. Movement makes the pain in her left hand/wrist worse. She also had associated itching in the area. She has had two knots or cysts in her left wrist for greater than or equal to two weeks and at least present for 1-2 years. She states she has a history of arthritis or carpal tunnel. She denies denies fever/chills, trauma or insect bite to affected area  Review of Systems:  General: +decreased appetite, denies fever/chills  HEENT: +mouth irritation  Cardiac: denies chest pain  Pulm: denies sob  Abd/GU: +constipation, denies GU blood, denies abdominal pain  Ext: denies lower extremity edema but chronic lower extremity edema, +left hand/wrist swelling since Saturday prior to admission  Neuro: +numbness left hand  Skin: +pink color left dorsal hand and itching  Psych: +anxiety history  Physical Exam:  Blood pressure 169/80, pulse 100, temperature 98 F (36.7 C), temperature source Oral, resp. rate 20, SpO2 96.00%.  Vitals reviewed.  General: resting in bed, NAD, pleasant, alert and oriented x 3  HEENT: [place]/at, no scleral icterus  Cardiac: RRR, no rubs, +systolic murmur  Pulm: clear to auscultation bilaterally, no wheezes, rales, or rhonchi  Abd: soft, nontender, nondistended, BS present, obese  Ext: warm and well perfused, 1+ edema lower extremities b/l; left dorsal wrist/hand: with min. Pinkness, 2+ edema to left hand, decreased ROM with hyperextension of fingers, left wrist with multiple palpable areas, +warmth to dorsal hand  Neuro: alert and oriented X3, grossly neurologically intact,  moving all 4 extremities    Hospital Course by problem list: 1. Left hand/wrist swelling  2. Leukocytosis 3. History of Hypertension  4. History of Hypothyroidism  5. History of COPD (per chart review) 6. Anxiety  7. Hyponatremia  8. Hypokalemia  79 y.o female presents for left hand pain, pinkness (dorsal surface), increased swelling, and decreased range of motion and use thought likely to be cellulitis   1. Left hand/wrist swelling  On admission noted with erythema (pink hue), edema, and warmth on exam initially clinically appeared like cellulitis.  She was afebrile but has leukocytosis which resolved. We attempted to rule out gout or possible septic joint with arthrocentesis but no fluid obtained 05/11/13. Complete Xray indicates severe osteoarthritis and possibly ligament tear (see imaging above) on 6/24. She was  initially given Ancef in the ED and Tylenol for pain which helped.  We continued as needed Tylenol 650 mg every 6 hours as needed for pain.  We also started Clindamycin 600 mg every 8 hours on 6/23 to 6/24 and switched to Cleocin 450 mg every 8 hours orally on 6/24.  She was stopped on Cleocin on 6/25 prior to discharge.  Pending blood cultures.  It was thought etiology of left hand/wrist swelling was due to severe osteoarthritis or ligament tear.  She will need outpatient referral to orthopedics through her PCP Dr. Felecia Shelling.  We also ordered a left wrist splint.    2. Leukocytosis Noted on admission 13.1 resolved prior to discharge 8.7.  Could be secondary to acute stress.    3. History of Hypertension  On admission 152/82.  We continued Aspirin 81 mg qd, Felodipine 10 mg qd, Lopressor 100 mg bid.  She has increased pulse pressure which can be seen with increasing age and is good predictor of cardiovascular disease in elderly. We did an EKG this admission. Normal sinus rhythm rate 60s, normal axis and intervals, poor R wave progression, possible left atrial abnormality.     4.  History of Hypothyroidism  Continue Synthyroid 100 mcg qam   5. History of COPD  Not on home O2. No acute issues.   6. Anxiety  Taking Xanax prior to admission which is not good in the elderly.  We changed to Ativan 0.5 mg bid as needed with ultimate goal to wean off.    7. Hyponatremia  127-->133-->133. Could be secondary to diuretic use. She was on Lasix 40 mg bid at home prior to admission though unclear why.  We held Lasix home dose on admission then resumed 40 mg daily instead of bid.  To be managed further by her PCP.    8. Hypokalemia 2.8-->3.6-->3.3. Replaced. Could be secondary to decreased oral intake or diuretic use.   She was given heparin for DVT prophylaxis      Discharge Vitals:   BP 152/82  Pulse 70  Temp(Src) 98.3 F (36.8 C) (Oral)  Resp 18  Ht 5\' 1"  (1.549 m)  Wt 167 lb (75.751 kg)  BMI 31.57 kg/m2  SpO2 96%  Discharge physical exam: General: resting in bed, NAD  HEENT: [place]/at,, no scleral icterus  Cardiac: RRR, no rubs, +systolic murmurs  Pulm: clear to auscultation bilaterally, no wheezes, rales, or rhonchi  Abd: soft, nontender, nondistended, BS present, obese  Ext: warm and well perfused, no pedal edema. Left hand wrist with edema but improved, improved ROM since admission back to baseline function  Neuro: alert and oriented X3, moving all 4 extremities, grossly neurologically intact.  Discharge Labs:  Results for VARONICA, SIHARATH (MRN [id number]) as of 05/13/2013 08:41  Ref. Range 05/10/2013 14:10 05/10/2013 22:35 05/11/2013 04:50 05/12/2013 06:04  Sodium Latest Range: 135-145 mEq/L 127 (L)  133 (L) 132 (L)  Potassium Latest Range: 3.5-5.1 mEq/L 2.8 (L)  3.6 3.3 (L)  Chloride Latest Range: 96-112 mEq/L 87 (L)  97 93 (L)  CO2 Latest Range: 19-32 mEq/L 27  27 30   BUN Latest Range: 6-23 mg/dL 6  6 5  (L)  Creatinine Latest Range: 0.50-1.10 mg/dL 8.65 7.84 6.96 2.95  Calcium Latest Range: 8.4-10.5 mg/dL 9.3  9.1 9.4  GFR calc non Af Amer Latest Range: >90  mL/min 77 (L) 79 (L) 79 (L) 65 (L)  GFR calc Af Amer Latest Range: >90 mL/min 89 (L) >90 >90 76 (L)  Glucose Latest  Range: 70-99 mg/dL 782 (H)  89 97  Magnesium Latest Range: 1.5-2.5 mg/dL 1.8  1.9 1.9  Alkaline Phosphatase Latest Range: 39-117 U/L 52     Albumin Latest Range: 3.5-5.2 g/dL 3.5     AST Latest Range: 0-37 U/L 20     ALT Latest Range: 0-35 U/L 15     Total Protein Latest Range: 6.0-8.3 g/dL 7.6     Total Bilirubin Latest Range: 0.3-1.2 mg/dL 0.7      Results for KEYLIN, FERRYMAN (MRN [id number]) as of 05/13/2013 08:41  Ref. Range 05/10/2013 14:10 05/11/2013 04:50  WBC Latest Range: 4.0-10.5 K/uL 13.1 (H) 8.7  RBC Latest Range: 3.87-5.11 MIL/uL 4.40 4.15  Hemoglobin Latest Range: 12.0-15.0 g/dL 57.8 46.9 (L)  HCT Latest Range: 36.0-46.0 % 34.8 (L) 33.3 (L)  MCV Latest Range: 78.0-100.0 fL 79.1 80.2  MCH Latest Range: 26.0-34.0 pg 28.2 27.2  MCHC Latest Range: 30.0-36.0 g/dL 62.9 52.8  RDW Latest Range: 11.5-15.5 % 15.0 15.3  Platelets Latest Range: 150-400 K/uL 332 316  Neutrophils Relative % Latest Range: 43-77 % 80 (H)   Lymphocytes Relative Latest Range: 12-46 % 11 (L)   Monocytes Relative Latest Range: 3-12 % 9   Eosinophils Relative Latest Range: 0-5 % 1   Basophils Relative Latest Range: 0-1 % 0   NEUT# Latest Range: 1.7-7.7 K/uL 10.5 (H)   Lymphocytes Absolute Latest Range: 0.7-4.0 K/uL 1.4   Monocytes Absolute Latest Range: 0.1-1.0 K/uL 1.1 (H)   Eosinophils Absolute Latest Range: 0.0-0.7 K/uL 0.1   Basophils Absolute Latest Range: 0.0-0.1 K/uL 0.0    Blood cultures negative to date and pending  Signed: Annett Gula, MD 05/13/2013, 8:36 AM   Time Spent on Discharge: 30 minutes Services Ordered on Discharge: none Equipment Ordered on Discharge: none

## 2013-05-13 NOTE — Discharge Summary (Signed)
  Date: 05/13/2013  Patient name: [name]  Medical record number: [id number]  Date of birth: Nov 30, 1928   This patient has been seen and the plan of care was discussed with the house staff. Please see their note for complete details. I concur with their findings and plan.  Jonah Blue, DO 05/13/2013, 2:54 PM

## 2013-05-17 LAB — CULTURE, BLOOD (ROUTINE X 2)
Culture: NO GROWTH
Culture: NO GROWTH

## 2013-05-23 ENCOUNTER — Inpatient Hospital Stay (HOSPITAL_COMMUNITY): Payer: Medicare Other

## 2013-05-23 ENCOUNTER — Inpatient Hospital Stay (HOSPITAL_COMMUNITY)
Admission: EM | Admit: 2013-05-23 | Discharge: 2013-05-27 | DRG: 372 | Disposition: A | Discharge status: Home / Self Care | Payer: Medicare Other | Attending: Internal Medicine | Admitting: Internal Medicine

## 2013-05-23 ENCOUNTER — Encounter (HOSPITAL_COMMUNITY): Payer: Self-pay | Admitting: Emergency Medicine

## 2013-05-23 DIAGNOSIS — I739 Peripheral vascular disease, unspecified: Secondary | ICD-10-CM

## 2013-05-23 DIAGNOSIS — E0590 Thyrotoxicosis, unspecified without thyrotoxic crisis or storm: Secondary | ICD-10-CM | POA: Diagnosis present

## 2013-05-23 DIAGNOSIS — F411 Generalized anxiety disorder: Secondary | ICD-10-CM | POA: Diagnosis present

## 2013-05-23 DIAGNOSIS — Z981 Arthrodesis status: Secondary | ICD-10-CM

## 2013-05-23 DIAGNOSIS — M19039 Primary osteoarthritis, unspecified wrist: Secondary | ICD-10-CM | POA: Diagnosis present

## 2013-05-23 DIAGNOSIS — E871 Hypo-osmolality and hyponatremia: Secondary | ICD-10-CM

## 2013-05-23 DIAGNOSIS — R197 Diarrhea, unspecified: Secondary | ICD-10-CM

## 2013-05-23 DIAGNOSIS — E876 Hypokalemia: Secondary | ICD-10-CM | POA: Diagnosis present

## 2013-05-23 DIAGNOSIS — I10 Essential (primary) hypertension: Secondary | ICD-10-CM | POA: Diagnosis present

## 2013-05-23 DIAGNOSIS — Z96659 Presence of unspecified artificial knee joint: Secondary | ICD-10-CM

## 2013-05-23 DIAGNOSIS — E785 Hyperlipidemia, unspecified: Secondary | ICD-10-CM | POA: Diagnosis present

## 2013-05-23 DIAGNOSIS — E039 Hypothyroidism, unspecified: Secondary | ICD-10-CM | POA: Diagnosis present

## 2013-05-23 DIAGNOSIS — Z79899 Other long term (current) drug therapy: Secondary | ICD-10-CM

## 2013-05-23 DIAGNOSIS — D72829 Elevated white blood cell count, unspecified: Secondary | ICD-10-CM | POA: Diagnosis present

## 2013-05-23 DIAGNOSIS — J449 Chronic obstructive pulmonary disease, unspecified: Secondary | ICD-10-CM

## 2013-05-23 DIAGNOSIS — A0472 Enterocolitis due to Clostridium difficile, not specified as recurrent: Principal | ICD-10-CM | POA: Diagnosis present

## 2013-05-23 HISTORY — DX: Helicobacter pylori (H. pylori) as the cause of diseases classified elsewhere: B96.81

## 2013-05-23 HISTORY — DX: Gastritis, unspecified, without bleeding: K29.70

## 2013-05-23 LAB — CBC
HCT: 35.4 % — ABNORMAL LOW (ref 36.0–46.0)
Hemoglobin: 12.6 g/dL (ref 12.0–15.0)
MCH: 28.4 pg (ref 26.0–34.0)
MCHC: 35.6 g/dL (ref 30.0–36.0)
MCV: 79.9 fL (ref 78.0–100.0)
RBC: 4.43 MIL/uL (ref 3.87–5.11)

## 2013-05-23 LAB — BASIC METABOLIC PANEL
CO2: 28 mEq/L (ref 19–32)
Calcium: 9.8 mg/dL (ref 8.4–10.5)
Creatinine, Ser: 0.99 mg/dL (ref 0.50–1.10)
GFR calc Af Amer: 60 mL/min — ABNORMAL LOW (ref >90)

## 2013-05-23 MED ORDER — ONDANSETRON HCL 4 MG PO TABS: 4.0000 mg | ORAL_TABLET | Freq: Four times a day (QID) | ORAL | Status: DC | PRN

## 2013-05-23 MED ORDER — LOPERAMIDE HCL 2 MG PO CAPS: 4.0000 mg | ORAL_CAPSULE | Freq: Once | ORAL | Status: AC

## 2013-05-23 MED ORDER — METRONIDAZOLE IN NACL 5-0.79 MG/ML-% IV SOLN
500.0000 mg | Freq: Three times a day (TID) | INTRAVENOUS | Status: DC
  Administered 2013-05-23 – 2013-05-24 (×3): 500 mg via INTRAVENOUS
  Filled 2013-05-23 (×6): qty 100

## 2013-05-23 MED ORDER — CILOSTAZOL 100 MG PO TABS
100.0000 mg | ORAL_TABLET | Freq: Two times a day (BID) | ORAL | Status: DC
  Administered 2013-05-24 – 2013-05-27 (×7): 100 mg via ORAL
  Filled 2013-05-23 (×10): qty 1

## 2013-05-23 MED ORDER — METOPROLOL TARTRATE 50 MG PO TABS
100.0000 mg | ORAL_TABLET | Freq: Two times a day (BID) | ORAL | Status: DC
  Administered 2013-05-23 – 2013-05-27 (×8): 100 mg via ORAL
  Filled 2013-05-23 (×8): qty 2

## 2013-05-23 MED ORDER — CILOSTAZOL 100 MG PO TABS
ORAL_TABLET | ORAL | Status: AC
  Filled 2013-05-23: qty 1

## 2013-05-23 MED ORDER — ENOXAPARIN SODIUM 40 MG/0.4ML ~~LOC~~ SOLN
40.0000 mg | SUBCUTANEOUS | Status: DC
  Administered 2013-05-23 – 2013-05-26 (×4): 40 mg via SUBCUTANEOUS
  Filled 2013-05-23 (×4): qty 0.4

## 2013-05-23 MED ORDER — FELODIPINE ER 5 MG PO TB24
ORAL_TABLET | ORAL | Status: AC
  Filled 2013-05-23: qty 2

## 2013-05-23 MED ORDER — ONDANSETRON HCL 4 MG/2ML IJ SOLN
4.0000 mg | Freq: Four times a day (QID) | INTRAMUSCULAR | Status: DC | PRN
  Administered 2013-05-25 – 2013-05-26 (×3): 4 mg via INTRAVENOUS
  Filled 2013-05-23 (×3): qty 2

## 2013-05-23 MED ORDER — SODIUM CHLORIDE 0.9 % IV SOLN: INTRAVENOUS | Status: DC

## 2013-05-23 MED ORDER — POTASSIUM CHLORIDE CRYS ER 20 MEQ PO TBCR
20.0000 meq | EXTENDED_RELEASE_TABLET | Freq: Every day | ORAL | Status: DC
  Administered 2013-05-24 – 2013-05-27 (×4): 20 meq via ORAL
  Filled 2013-05-23 (×4): qty 1

## 2013-05-23 MED ORDER — PANTOPRAZOLE SODIUM 40 MG IV SOLR
40.0000 mg | INTRAVENOUS | Status: DC
  Administered 2013-05-23 – 2013-05-25 (×3): 40 mg via INTRAVENOUS
  Filled 2013-05-23 (×3): qty 40

## 2013-05-23 MED ORDER — SODIUM CHLORIDE 0.9 % IV SOLN
INTRAVENOUS | Status: DC
  Administered 2013-05-23 – 2013-05-26 (×5): via INTRAVENOUS

## 2013-05-23 MED ORDER — LOPERAMIDE HCL 2 MG PO CAPS
ORAL_CAPSULE | ORAL | Status: AC
  Administered 2013-05-23: 4 mg via ORAL
  Filled 2013-05-23: qty 2

## 2013-05-23 MED ORDER — SODIUM CHLORIDE 0.9 % IV BOLUS (SEPSIS)
500.0000 mL | Freq: Once | INTRAVENOUS | Status: AC
  Administered 2013-05-23: 500 mL via INTRAVENOUS

## 2013-05-23 MED ORDER — METRONIDAZOLE IN NACL 5-0.79 MG/ML-% IV SOLN
INTRAVENOUS | Status: AC
  Filled 2013-05-23: qty 200

## 2013-05-23 MED ORDER — ASPIRIN 81 MG PO CHEW
81.0000 mg | CHEWABLE_TABLET | Freq: Every day | ORAL | Status: DC
  Administered 2013-05-24 – 2013-05-27 (×4): 81 mg via ORAL
  Filled 2013-05-23 (×4): qty 1

## 2013-05-23 MED ORDER — LORAZEPAM 0.5 MG PO TABS
0.5000 mg | ORAL_TABLET | Freq: Two times a day (BID) | ORAL | Status: DC | PRN
  Administered 2013-05-23 – 2013-05-26 (×4): 0.5 mg via ORAL
  Filled 2013-05-23 (×5): qty 1

## 2013-05-23 MED ORDER — ACETAMINOPHEN 325 MG PO TABS
650.0000 mg | ORAL_TABLET | Freq: Four times a day (QID) | ORAL | Status: DC | PRN
  Administered 2013-05-24 – 2013-05-25 (×2): 650 mg via ORAL
  Filled 2013-05-23 (×2): qty 2

## 2013-05-23 MED ORDER — FELODIPINE ER 5 MG PO TB24
10.0000 mg | ORAL_TABLET | Freq: Every day | ORAL | Status: DC
  Administered 2013-05-24 – 2013-05-27 (×4): 10 mg via ORAL
  Filled 2013-05-23 (×5): qty 2

## 2013-05-23 MED ORDER — ACETAMINOPHEN 650 MG RE SUPP: 650.0000 mg | Freq: Four times a day (QID) | RECTAL | Status: DC | PRN

## 2013-05-23 MED ORDER — LEVOTHYROXINE SODIUM 100 MCG PO TABS
100.0000 ug | ORAL_TABLET | Freq: Every day | ORAL | Status: DC
  Administered 2013-05-24 – 2013-05-27 (×4): 100 ug via ORAL
  Filled 2013-05-23 (×5): qty 1

## 2013-05-23 NOTE — ED Notes (Signed)
Diarrhea x 1 week now. [facility]/c [facility] for cellulitis with iv antibiotics June 27. Pt alert/oriented. Pt denies pain/n/v.

## 2013-05-23 NOTE — ED Notes (Signed)
Pt repeatedly asking for blankets. Explained to pt that if she has any more then it would cause her fever to go up more. Pt has one blanket on body and additional blanket over her feet.

## 2013-05-23 NOTE — H&P (Signed)
PCP:   FANTA,TESFAYE, MD   Chief Complaint:  Diarrhea  HPI: 79 year old female who was recently discharged from [facility] after being treated for left hand swelling due to severe osteoarthritis. Patient also received clindamycin for 2 days, patient developed diarrhea after discharge and has been having 4-5 loose bowel movements everyday. She also admits to some nausea but no vomiting. Patient denies any blood in the stool, she denies chest pain shortness of breath. Patient did take over-the-counter Imodium for diarrhea. Allergies:   Allergies  Allergen Reactions  . Amoxicillin Other (See Comments)    Burning sensation.  Marland Kitchen Penicillins Other (See Comments)    Burning sensation.      Past Medical History  Diagnosis Date  . Hypertension   . Cancer     colon 2009 or 2010  . Arthritis   . Hypothyroidism     Past Surgical History  Procedure Laterality Date  . Cervical fusion    . Abdominal hysterectomy    . Joint replacement      bilateral knee replacement, left shoulder surgery  . Colon resection    . Carpal tunnel release      Prior to Admission medications   Medication Sig Start Date End Date Taking? Authorizing Provider  aspirin 81 MG chewable tablet Chew 81 mg by mouth daily.   Yes Historical Provider, MD  celecoxib (CELEBREX) 100 MG capsule Take 100 mg by mouth daily.   Yes Historical Provider, MD  cholecalciferol (VITAMIN D) 400 UNITS TABS Take 400 Units by mouth 2 (two) times daily.   Yes Historical Provider, MD  cilostazol (PLETAL) 100 MG tablet Take 100 mg by mouth 2 (two) times daily.   Yes Historical Provider, MD  felodipine (PLENDIL) 10 MG 24 hr tablet Take 10 mg by mouth daily.   Yes Historical Provider, MD  furosemide (LASIX) 40 MG tablet Take 1 tablet (40 mg total) by mouth daily. 05/12/13  Yes Annett Gula, MD  lansoprazole (PREVACID) 30 MG capsule Take 30 mg by mouth daily.   Yes Historical Provider, MD  levothyroxine (SYNTHROID, LEVOTHROID) 100  MCG tablet Take 100 mcg by mouth daily before breakfast.   Yes Historical Provider, MD  LORazepam (ATIVAN) 0.5 MG tablet Take 1 tablet (0.5 mg total) by mouth 2 (two) times daily. 05/12/13  Yes Annett Gula, MD  metoprolol (LOPRESSOR) 100 MG tablet Take 100 mg by mouth 2 (two) times daily.   Yes Historical Provider, MD  potassium chloride SA (K-DUR,KLOR-CON) 20 MEQ tablet Take 20 mEq by mouth daily.   Yes Historical Provider, MD    Social History:  reports that she has never smoked. She does not have any smokeless tobacco history on file. She reports that she does not drink alcohol or use illicit drugs.  History reviewed. No pertinent family history.   All the positives are listed in BOLD  Review of Systems:  HEENT: Denies headache, blurred vision, runny nose, sore throat,  Neck: Denies  hypothyroidism,lymphadenopathy Chest : Denies shortness of breath, no history of COPD Heart : Denies Chest pain,  coronary arterey disease GI: Denies  nausea, vomiting, diarrhea, constipation GU: Denies dysuria, urgency, frequency of urination, hematuria Neuro: Denies stroke, seizures, syncope Psych: Denies depression, anxiety, hallucinations   Physical Exam: Blood pressure 120/71, pulse 84, temperature 98.9 F (37.2 C), temperature source Oral, resp. rate 16, SpO2 97.00%. Constitutional:   Patient is a well-developed and well-nourished female* in no acute distress and cooperative with exam. Head: Normocephalic and atraumatic  Mouth: Mucus membranes moist Eyes: PERRL, EOMI, conjunctivae normal Neck: Supple, No Thyromegaly Cardiovascular: RRR, S1 normal, S2 normal Pulmonary/Chest: CTAB, no wheezes, rales, or rhonchi Abdominal: Soft. Non-tender, non-distended, bowel sounds are normal, no masses, organomegaly, or guarding present.  Neurological: A&O x3, Strenght is normal and symmetric bilaterally, cranial nerve II-XII are grossly intact, no focal motor deficit, sensory intact to light touch  bilaterally.  Extremities : No Cyanosis, Clubbing or Edema   Labs on Admission:  Results for orders placed during the hospital encounter of 05/23/13 (from the past 48 hour(s))  CBC     Status: Abnormal   Collection Time    05/23/13  5:07 PM      Result Value Range   WBC 14.2 (*) 4.0 - 10.5 K/uL   RBC 4.43  3.87 - 5.11 MIL/uL   Hemoglobin 12.6  12.0 - 15.0 g/dL   HCT 16.1 (*) 09.6 - 04.5 %   MCV 79.9  78.0 - 100.0 fL   MCH 28.4  26.0 - 34.0 pg   MCHC 35.6  30.0 - 36.0 g/dL   RDW 40.9  81.1 - 91.4 %   Platelets 462 (*) 150 - 400 K/uL  BASIC METABOLIC PANEL     Status: Abnormal   Collection Time    05/23/13  5:07 PM      Result Value Range   Sodium 124 (*) 135 - 145 mEq/L   Potassium 3.4 (*) 3.5 - 5.1 mEq/L   Chloride 85 (*) 96 - 112 mEq/L   CO2 28  19 - 32 mEq/L   Glucose, Bld 130 (*) 70 - 99 mg/dL   BUN 7  6 - 23 mg/dL   Creatinine, Ser 7.82  0.50 - 1.10 mg/dL   Calcium 9.8  8.4 - 95.6 mg/dL   GFR calc non Af Amer 52 (*) >90 mL/min   GFR calc Af Amer 60 (*) >90 mL/min   Comment:            The eGFR has been calculated     using the CKD EPI equation.     This calculation has not been     validated in all clinical     situations.     eGFR's persistently     <90 mL/min signify     possible Chronic Kidney Disease.    Radiological Exams on Admission: No results found.  Assessment/Plan Active Problems:   HYPERTHYROIDISM   HYPERTENSION   PERIPHERAL VASCULAR DISEASE   Diarrhea  hyponatremia   Diarrhea Patient will be started on IV Flagyl for possible C. difficile colitis as she did receive clindamycin for 3 days at [facility] Will obtain stool for C. difficile PCR IV normal saline for rehydration  Hyponatremia Most likely due to diarrhea Will rehydrate and follow the BMP in the morning  Hypothyroidism Continue Synthroid  Anxiety Patient was on long-term benzodiazepines which was discontinued at [facility] She sees worsening of anxiety  somewhat to restart lorazepam 0.5 by mouth twice a day when necessary for anxiety  Peripheral arterial disease Continue Pletal, Plendil  Left wrist swelling Chronic No acute changes  Code status: Full code  Family discussion: Discussed with patient's daughter at bedside   Time Spent on Admission: 60 min  [facility] Pager: [phone] 05/23/2013, 9:08 PM  If 7PM-7AM, please contact night-coverage  [email or web address]  Password [email or web address]

## 2013-05-23 NOTE — ED Provider Notes (Signed)
History    CSN: [id number] Arrival date & time 05/23/13  1645  First MD Initiated Contact with Patient 05/23/13 1703     Chief Complaint  Patient presents with  . Diarrhea   (Consider location/radiation/quality/duration/timing/severity/associated sxs/prior Treatment) Patient is a 79 y.o. female presenting with diarrhea. The history is provided by the patient (pt complains of diarhea for 10 days getting worse).  Diarrhea Quality:  Semi-solid Severity:  Moderate Onset quality:  Gradual Timing:  Constant Progression:  Worsening Relieved by:  Nothing Associated symptoms: no abdominal pain and no headaches    Past Medical History  Diagnosis Date  . Hypertension   . Cancer     colon 2009 or 2010  . Arthritis   . Hypothyroidism    Past Surgical History  Procedure Laterality Date  . Cervical fusion    . Abdominal hysterectomy    . Joint replacement      bilateral knee replacement, left shoulder surgery  . Colon resection    . Carpal tunnel release     History reviewed. No pertinent family history. History  Substance Use Topics  . Smoking status: Never Smoker   . Smokeless tobacco: Not on file  . Alcohol Use: No   OB History   Grav Para Term Preterm Abortions TAB SAB Ect Mult Living                 Review of Systems  Constitutional: Negative for appetite change and fatigue.  HENT: Negative for congestion, sinus pressure and ear discharge.   Eyes: Negative for discharge.  Respiratory: Negative for cough.   Cardiovascular: Negative for chest pain.  Gastrointestinal: Positive for diarrhea. Negative for abdominal pain.  Genitourinary: Negative for frequency and hematuria.  Musculoskeletal: Negative for back pain.  Skin: Negative for rash.  Neurological: Negative for seizures and headaches.  Psychiatric/Behavioral: Negative for hallucinations.    Allergies  Amoxicillin and Penicillins  Home Medications   Current Outpatient Rx  Name  Route  Sig  Dispense   Refill  . aspirin 81 MG chewable tablet   Oral   Chew 81 mg by mouth daily.         . celecoxib (CELEBREX) 100 MG capsule   Oral   Take 100 mg by mouth daily.         . cholecalciferol (VITAMIN D) 400 UNITS TABS   Oral   Take 400 Units by mouth 2 (two) times daily.         . cilostazol (PLETAL) 100 MG tablet   Oral   Take 100 mg by mouth 2 (two) times daily.         . felodipine (PLENDIL) 10 MG 24 hr tablet   Oral   Take 10 mg by mouth daily.         . furosemide (LASIX) 40 MG tablet   Oral   Take 1 tablet (40 mg total) by mouth daily.   30 tablet   1   . lansoprazole (PREVACID) 30 MG capsule   Oral   Take 30 mg by mouth daily.         Marland Kitchen levothyroxine (SYNTHROID, LEVOTHROID) 100 MCG tablet   Oral   Take 100 mcg by mouth daily before breakfast.         . LORazepam (ATIVAN) 0.5 MG tablet   Oral   Take 1 tablet (0.5 mg total) by mouth 2 (two) times daily.   30 tablet   0   . metoprolol (LOPRESSOR) 100  MG tablet   Oral   Take 100 mg by mouth 2 (two) times daily.         . potassium chloride SA (K-DUR,KLOR-CON) 20 MEQ tablet   Oral   Take 20 mEq by mouth daily.          BP 139/69  Pulse 84  Temp(Src) 100.4 F (38 C) (Oral)  Resp 16  SpO2 97% Physical Exam  Constitutional: She is oriented to person, place, and time. She appears well-developed.  HENT:  Head: Normocephalic.  Eyes: Conjunctivae and EOM are normal. No scleral icterus.  Neck: Neck supple. No thyromegaly present.  Cardiovascular: Normal rate and regular rhythm.  Exam reveals no gallop and no friction rub.   No murmur heard. Pulmonary/Chest: No stridor. She has no wheezes. She has no rales. She exhibits no tenderness.  Abdominal: She exhibits no distension. There is no tenderness. There is no rebound.  Musculoskeletal: Normal range of motion. She exhibits no edema.  Lymphadenopathy:    She has no cervical adenopathy.  Neurological: She is oriented to person, place, and time.  Coordination normal.  Skin: No rash noted. No erythema.  Psychiatric: She has a normal mood and affect. Her behavior is normal.    ED Course  Procedures (including critical care time) Labs Reviewed  CBC - Abnormal; Notable for the following:    WBC 14.2 (*)    HCT 35.4 (*)    Platelets 462 (*)    All other components within normal limits  BASIC METABOLIC PANEL - Abnormal; Notable for the following:    Sodium 124 (*)    Potassium 3.4 (*)    Chloride 85 (*)    Glucose, Bld 130 (*)    GFR calc non Af Amer 52 (*)    GFR calc Af Amer 60 (*)    All other components within normal limits  CLOSTRIDIUM DIFFICILE BY PCR   No results found. 1. Hyponatremia     MDM    Benny Lennert, MD 05/23/13 [id number]

## 2013-05-23 NOTE — ED Notes (Signed)
Pt reports "feeling better".  Reports she has not had diarrhea since receiving dose of imodium. VS stable.  Pt denies complaints at present time.

## 2013-05-24 LAB — COMPREHENSIVE METABOLIC PANEL
AST: 15 U/L (ref 0–37)
Albumin: 2.9 g/dL — ABNORMAL LOW (ref 3.5–5.2)
Alkaline Phosphatase: 54 U/L (ref 39–117)
Chloride: 91 mEq/L — ABNORMAL LOW (ref 96–112)
Potassium: 2.8 mEq/L — ABNORMAL LOW (ref 3.5–5.1)
Total Bilirubin: 0.4 mg/dL (ref 0.3–1.2)

## 2013-05-24 LAB — CBC
Platelets: 404 10*3/uL — ABNORMAL HIGH (ref 150–400)
RDW: 14.7 % (ref 11.5–15.5)
WBC: 12.5 10*3/uL — ABNORMAL HIGH (ref 4.0–10.5)

## 2013-05-24 LAB — CLOSTRIDIUM DIFFICILE BY PCR: Toxigenic C. Difficile by PCR: POSITIVE — AB

## 2013-05-24 MED ORDER — METRONIDAZOLE 500 MG PO TABS
500.0000 mg | ORAL_TABLET | Freq: Three times a day (TID) | ORAL | Status: DC
  Administered 2013-05-24 – 2013-05-27 (×8): 500 mg via ORAL
  Filled 2013-05-24 (×8): qty 1

## 2013-05-24 MED ORDER — POTASSIUM CHLORIDE 10 MEQ/100ML IV SOLN
10.0000 meq | INTRAVENOUS | Status: AC
  Administered 2013-05-24 (×6): 10 meq via INTRAVENOUS
  Filled 2013-05-24 (×6): qty 100

## 2013-05-24 MED ORDER — CHLORHEXIDINE GLUCONATE 0.12 % MT SOLN
15.0000 mL | Freq: Two times a day (BID) | OROMUCOSAL | Status: DC
  Administered 2013-05-25 – 2013-05-27 (×6): 15 mL via OROMUCOSAL
  Filled 2013-05-24 (×7): qty 15

## 2013-05-24 MED ORDER — BIOTENE DRY MOUTH MT LIQD
15.0000 mL | Freq: Two times a day (BID) | OROMUCOSAL | Status: DC
  Administered 2013-05-26 (×2): 15 mL via OROMUCOSAL

## 2013-05-24 NOTE — Progress Notes (Signed)
Patient's family concerned about patient's decreased appetite and continued diarrhea. Family given education pertaining to C. Diff. Will request probiotics as evidence is supportive of use with C diff patients.

## 2013-05-24 NOTE — Consult Note (Signed)
Reason for Consult: left wrist pain Referring Physician: Fusako Tanabe [name] is an 79 y.o. female.  HPI: She has had long history of left wrist pain and remote fracture.  She was worked up for left wrist pain about ten to 14 days ago at [facility] where she was admitted.  X-rays show advanced DJD of the left wrist and no acute fracture.  She was given a splint to use.  She is not wearing it here as her left wrist is not that tender.  She knows to use it when she is out of bed.  She has no redness, no puncture wound, etc.  She says she is better.  Past Medical History  Diagnosis Date  . Hypertension   . Arthritis   . Hypothyroidism     Past Surgical History  Procedure Laterality Date  . Cervical fusion    . Abdominal hysterectomy    . Joint replacement      bilateral knee replacement, left shoulder surgery  . Colon resection    . Carpal tunnel release      History reviewed. No pertinent family history.  Social History:  reports that she has never smoked. She does not have any smokeless tobacco history on file. She reports that she does not drink alcohol or use illicit drugs.  Allergies:  Allergies  Allergen Reactions  . Amoxicillin Other (See Comments)    Burning sensation.  Marland Kitchen Penicillins Other (See Comments)    Burning sensation.    Medications: I have reviewed the patient's current medications.  Results for orders placed during the hospital encounter of 05/23/13 (from the past 48 hour(s))  CBC     Status: Abnormal   Collection Time    05/23/13  5:07 PM      Result Value Range   WBC 14.2 (*) 4.0 - 10.5 K/uL   RBC 4.43  3.87 - 5.11 MIL/uL   Hemoglobin 12.6  12.0 - 15.0 g/dL   HCT 82.9 (*) 56.2 - 13.0 %   MCV 79.9  78.0 - 100.0 fL   MCH 28.4  26.0 - 34.0 pg   MCHC 35.6  30.0 - 36.0 g/dL   RDW 86.5  78.4 - 69.6 %   Platelets 462 (*) 150 - 400 K/uL  BASIC METABOLIC PANEL     Status: Abnormal   Collection Time    05/23/13  5:07 PM      Result Value Range   Sodium 124  (*) 135 - 145 mEq/L   Potassium 3.4 (*) 3.5 - 5.1 mEq/L   Chloride 85 (*) 96 - 112 mEq/L   CO2 28  19 - 32 mEq/L   Glucose, Bld 130 (*) 70 - 99 mg/dL   BUN 7  6 - 23 mg/dL   Creatinine, Ser 2.95  0.50 - 1.10 mg/dL   Calcium 9.8  8.4 - 28.4 mg/dL   GFR calc non Af Amer 52 (*) >90 mL/min   GFR calc Af Amer 60 (*) >90 mL/min   Comment:            The eGFR has been calculated     using the CKD EPI equation.     This calculation has not been     validated in all clinical     situations.     eGFR's persistently     <90 mL/min signify     possible Chronic Kidney Disease.  TSH     Status: None   Collection Time  05/23/13  5:07 PM      Result Value Range   TSH 4.338  0.350 - 4.500 uIU/mL  CLOSTRIDIUM DIFFICILE BY PCR     Status: Abnormal   Collection Time    05/24/13  1:37 AM      Result Value Range   C difficile by pcr POSITIVE (*) NEGATIVE   Comment: CRITICAL RESULT CALLED TO, READ BACK BY AND VERIFIED WITH:      WALKER,L @ 0319 ON 05/24/13 BY WOODIE,J  CBC     Status: Abnormal   Collection Time    05/24/13  4:59 AM      Result Value Range   WBC 12.5 (*) 4.0 - 10.5 K/uL   RBC 4.12  3.87 - 5.11 MIL/uL   Hemoglobin 11.5 (*) 12.0 - 15.0 g/dL   HCT 16.1 (*) 09.6 - 04.5 %   MCV 80.8  78.0 - 100.0 fL   MCH 27.9  26.0 - 34.0 pg   MCHC 34.5  30.0 - 36.0 g/dL   RDW 40.9  81.1 - 91.4 %   Platelets 404 (*) 150 - 400 K/uL  COMPREHENSIVE METABOLIC PANEL     Status: Abnormal   Collection Time    05/24/13  4:59 AM      Result Value Range   Sodium 129 (*) 135 - 145 mEq/L   Potassium 2.8 (*) 3.5 - 5.1 mEq/L   Comment: DELTA CHECK NOTED   Chloride 91 (*) 96 - 112 mEq/L   CO2 26  19 - 32 mEq/L   Glucose, Bld 110 (*) 70 - 99 mg/dL   BUN 4 (*) 6 - 23 mg/dL   Creatinine, Ser 7.82  0.50 - 1.10 mg/dL   Calcium 8.7  8.4 - 95.6 mg/dL   Total Protein 6.5  6.0 - 8.3 g/dL   Albumin 2.9 (*) 3.5 - 5.2 g/dL   AST 15  0 - 37 U/L   ALT 15  0 - 35 U/L   Alkaline Phosphatase 54  39 - 117 U/L    Total Bilirubin 0.4  0.3 - 1.2 mg/dL   GFR calc non Af Amer 76 (*) >90 mL/min   GFR calc Af Amer 88 (*) >90 mL/min   Comment:            The eGFR has been calculated     using the CKD EPI equation.     This calculation has not been     validated in all clinical     situations.     eGFR's persistently     <90 mL/min signify     possible Chronic Kidney Disease.    Dg Abd 2 Views  05/23/2013   *RADIOLOGY REPORT*  Clinical Data: Abdominal distension and diarrhea.  ABDOMEN - 2 VIEW  Comparison: CT abdomen and pelvis 04/16/2009  Findings: Technically limited study due to underpenetration on the upright view and overpenetration on the supine views.  There is no evidence of significant small or large bowel distension.  No free intra-abdominal air is demonstrated.  Scoliosis and degenerative changes in the spine.  Vascular calcifications.  IMPRESSION: Technically limited study due to poor penetration.  No evidence of small or large bowel distension to suggest obstruction.   Original Report Authenticated By: Burman Nieves, M.D.    Review of Systems  Respiratory:       Copd  Cardiovascular:       Hypertension, peripheral vascular disease, hyperlipidemia  Gastrointestinal: Positive for heartburn and diarrhea.  Musculoskeletal: Positive  for back pain and joint pain (Pain left wrist for some time. She was given a splint at [facility] last week.).   Blood pressure 129/57, pulse 70, temperature 99.3 F (37.4 C), temperature source Oral, resp. rate 16, height 5\' 1"  (1.549 m), weight 75.9 kg (167 lb 5.3 oz), SpO2 98.00%. Physical Exam  Constitutional: She is oriented to person, place, and time. She appears well-developed and well-nourished.  HENT:  Head: Normocephalic and atraumatic.  Eyes: Conjunctivae and EOM are normal. Pupils are equal, round, and reactive to light.  Neck: Normal range of motion. Neck supple.  Cardiovascular: Normal rate, regular rhythm and intact distal pulses.   Respiratory: Effort  normal and breath sounds normal.  GI: Soft. There is tenderness.  Musculoskeletal: She exhibits tenderness (Left wrist with some tenderness and decreased dorsi and volar flexion.  Slight swelling.  No redness.  NV is intact.).  Neurological: She is alert and oriented to person, place, and time. She has normal reflexes.  Skin: Skin is warm and dry.  Psychiatric: She has a normal mood and affect. Her behavior is normal. Judgment and thought content normal.    Assessment/Plan: Marked DJD of the left wrist.  Use the cock up splint.  I can see after discharge for consideration of a MRI of the wrist if it continues to be very painful.  She is doing well now.  [place] 05/24/2013, 3:38 PM

## 2013-05-24 NOTE — Progress Notes (Signed)
Subjective:  Patient was admitted due to frequent diarrhea. She is growing C. Diff. Patient also complains of pain and swelling of the lt wrist  Objective: Vital signs in last 24 hours: Temp:  [97.4 F (36.3 C)-100.4 F (38 C)] 99.3 F (37.4 C) (07/07 0530) Pulse Rate:  [70-85] 70 (07/07 0530) Resp:  [16] 16 (07/07 0530) BP: (120-146)/(57-78) 129/57 mmHg (07/07 0530) SpO2:  [96 %-98 %] 98 % (07/07 0530) Weight:  [75.9 kg (167 lb 5.3 oz)] 75.9 kg (167 lb 5.3 oz) 06/09/23 2000) Weight change:  Last BM Date: 06/08/2013  Intake/Output from previous day: 09-Jun-2023 0701 - 07/07 0700 In: 565 [I.V.:465; IV Piggyback:100] Out: 250 [Urine:250]  PHYSICAL EXAM General appearance: alert and no distress Resp: clear to auscultation bilaterally Cardio: S1, S2 normal GI: soft, non-tender; bowel sounds normal; no masses,  no organomegaly Extremities: tender swelling of the LT wrist  Lab Results:    @labtest @ [facility] No results found for this basename: PHART, PCO2, PO2ART, TCO2, HCO3,  in the last 72 hours CULTURES Recent Results (from the past 240 hour(s))  CLOSTRIDIUM DIFFICILE BY PCR     Status: Abnormal   Collection Time    05/24/13  1:37 AM      Result Value Range Status   C difficile by pcr POSITIVE (*) NEGATIVE Final   Comment: CRITICAL RESULT CALLED TO, READ BACK BY AND VERIFIED WITH:      WALKER,L @ 0319 ON 05/24/13 BY Anner Crete   Studies/Results: Dg Abd 2 Views  08-Jun-2013   *RADIOLOGY REPORT*  Clinical Data: Abdominal distension and diarrhea.  ABDOMEN - 2 VIEW  Comparison: CT abdomen and pelvis 04/16/2009  Findings: Technically limited study due to underpenetration on the upright view and overpenetration on the supine views.  There is no evidence of significant small or large bowel distension.  No free intra-abdominal air is demonstrated.  Scoliosis and degenerative changes in the spine.  Vascular calcifications.  IMPRESSION: Technically limited study due to poor penetration.  No evidence  of small or large bowel distension to suggest obstruction.   Original Report Authenticated By: Burman Nieves, M.D.    Medications: I have reviewed the patient's current medications.  Assesment: Active Problems:   HYPERTHYROIDISM   HYPERTENSION   PERIPHERAL VASCULAR DISEASE   Diarrhea c. Diff colitis Lt wrist swelling hypokalemia    Plan: Continue flagyl K+ supplement Orthopedic consult     LOS: 1 day   Ercell Perlman 05/24/2013, 8:06 AM

## 2013-05-24 NOTE — Progress Notes (Signed)
Utilization Review Complete  

## 2013-05-24 NOTE — Progress Notes (Signed)
INITIAL NUTRITION ASSESSMENT  DOCUMENTATION CODES Per approved criteria  -Obesity Class I   INTERVENTION: Resource Breeze po TID, each supplement provides 250 kcal and 9 grams of protein.  NUTRITION DIAGNOSIS: Inadequate oral intake related to altered GI tract function as evidenced by diarrhea (c. Diff collitis), and current clear liquid diet.   Goal: Pt to meet >/= 90% of their estimated nutrition needs  Monitor:  Diet advancement and tolerance, po intake, labs and wt trends  Reason for Assessment: Malnutrition Screen  79 y.o. female  Admitting c/o: Diarrhea (10 days), osteoarthritis, hyponatremia   ASSESSMENT: Pt has abdominal distension and diarrhea (c. Diff colitis). She's currently receiving Clear liquids. Non-pitting edema bilateral lower extremeties.  Ortho consult pending due to left wrist swelling (recently discharged from [facility] dx severe osteoarthritis).  Surprisingly her weight has not significantly changed (maybe due to edema masking decrease).   Will add oral supplement and follow for tolerance, diet advancement and monitor for adequacy of intake.  She does not meet criteria for malnutrition at this time.  Height: Ht Readings from Last 1 Encounters:  05/23/13 5\' 1"  (1.549 m)    Weight: Wt Readings from Last 1 Encounters:  05/23/13 167 lb 5.3 oz (75.9 kg)    Ideal Body Weight: 105# (47.7 kg)  % Ideal Body Weight: 159%  Wt Readings from Last 10 Encounters:  05/23/13 167 lb 5.3 oz (75.9 kg)  05/12/13 167 lb (75.751 kg)  08/20/06 173 lb (78.472 kg)    Usual Body Weight: 165-170#  % Usual Body Weight: 98%  BMI:  Body mass index is 31.63 kg/(m^2).Obesity Class I  Estimated Nutritional Needs: Kcal: 1500-1700 Protein:65-77 gr  Fluid: >2000 ml/day  Skin: No issues noted  Diet Order: Clear Liquid  EDUCATION NEEDS: -Education needs addressed   Intake/Output Summary (Last 24 hours) at 05/24/13 1021 Last data filed at 05/24/13 0531  Gross per  24 hour  Intake    565 ml  Output    250 ml  Net    315 ml    Last BM: 05/24/13 diarrhea (c.diff)  Labs:   Recent Labs Lab 05/23/13 1707 05/24/13 0459  NA 124* 129*  K 3.4* 2.8*  CL 85* 91*  CO2 28 26  BUN 7 4*  CREATININE 0.99 0.78  CALCIUM 9.8 8.7  GLUCOSE 130* 110*    CBG (last 3)  No results found for this basename: GLUCAP,  in the last 72 hours  Scheduled Meds: . aspirin  81 mg Oral Daily  . cilostazol  100 mg Oral BID  . enoxaparin (LOVENOX) injection  40 mg Subcutaneous Q24H  . felodipine  10 mg Oral Daily  . levothyroxine  100 mcg Oral QAC breakfast  . metoprolol  100 mg Oral BID  . metronidazole  500 mg Intravenous Q8H  . pantoprazole (PROTONIX) IV  40 mg Intravenous Q24H  . potassium chloride  10 mEq Intravenous Q1 Hr x 6  . potassium chloride SA  20 mEq Oral Daily    Continuous Infusions: . sodium chloride 75 mL/hr at 05/23/13 2319    Past Medical History  Diagnosis Date  . Hypertension   . Arthritis   . Hypothyroidism     Past Surgical History  Procedure Laterality Date  . Cervical fusion    . Abdominal hysterectomy    . Joint replacement      bilateral knee replacement, left shoulder surgery  . Colon resection    . Carpal tunnel release      Larita Fife  Nilani Hugill MS,RD,LDN,CSG Office: [phone] Pager: [phone]

## 2013-05-25 LAB — BASIC METABOLIC PANEL
BUN: 4 mg/dL — ABNORMAL LOW (ref 6–23)
Calcium: 8.7 mg/dL (ref 8.4–10.5)
GFR calc non Af Amer: 79 mL/min — ABNORMAL LOW (ref >90)
Glucose, Bld: 101 mg/dL — ABNORMAL HIGH (ref 70–99)

## 2013-05-25 MED ORDER — POTASSIUM CHLORIDE 10 MEQ/100ML IV SOLN
10.0000 meq | INTRAVENOUS | Status: AC
  Administered 2013-05-25 (×5): 10 meq via INTRAVENOUS
  Filled 2013-05-25 (×3): qty 100
  Filled 2013-05-25: qty 200

## 2013-05-25 NOTE — Progress Notes (Addendum)
Patient pulled out IV inadvertently. 5 attempts were made for reinsertion. MD was made aware and orders were given and followed.  Flagyl was changed to PO Order for PICC line was entered  Patient is coughing up copious amounts of mucous.

## 2013-05-25 NOTE — Progress Notes (Signed)
Subjective:  Patient continued to have frequent diarrhea. No fever or chills. Her K+ is still low. She is hyponatremic.  Objective: Vital signs in last 24 hours: Temp:  [98.6 F (37 C)-100.9 F (38.3 C)] 98.6 F (37 C) (07/08 0511) Pulse Rate:  [70-88] 70 (07/08 0511) Resp:  [16-18] 18 (07/08 0511) BP: (121-138)/(68-76) 121/68 mmHg (07/08 0511) SpO2:  [95 %-100 %] 98 % (07/08 0511) Weight change:  Last BM Date: 05/24/13  Intake/Output from previous day: 07/07 0701 - 07/08 0700 In: 600 [P.O.:600] Out: -   PHYSICAL EXAM General appearance: alert and no distress Resp: clear to auscultation bilaterally Cardio: S1, S2 normal GI: soft, non-tender; bowel sounds normal; no masses,  no organomegaly Extremities: tender swelling of the LT wrist  Lab Results:    @labtest @ [facility] No results found for this basename: PHART, PCO2, PO2ART, TCO2, HCO3,  in the last 72 hours CULTURES Recent Results (from the past 240 hour(s))  CLOSTRIDIUM DIFFICILE BY PCR     Status: Abnormal   Collection Time    05/24/13  1:37 AM      Result Value Range Status   C difficile by pcr POSITIVE (*) NEGATIVE Final   Comment: CRITICAL RESULT CALLED TO, READ BACK BY AND VERIFIED WITH:      WALKER,L @ 0319 ON 05/24/13 BY WOODIE,J  STOOL CULTURE     Status: None   Collection Time    05/24/13  4:50 AM      Result Value Range Status   Specimen Description STOOL   Final   Special Requests NONE   Final   Culture Culture reincubated for better growth   Final   Report Status PENDING   Incomplete   Studies/Results: Dg Abd 2 Views  06-07-13   *RADIOLOGY REPORT*  Clinical Data: Abdominal distension and diarrhea.  ABDOMEN - 2 VIEW  Comparison: CT abdomen and pelvis 04/16/2009  Findings: Technically limited study due to underpenetration on the upright view and overpenetration on the supine views.  There is no evidence of significant small or large bowel distension.  No free intra-abdominal air is demonstrated.   Scoliosis and degenerative changes in the spine.  Vascular calcifications.  IMPRESSION: Technically limited study due to poor penetration.  No evidence of small or large bowel distension to suggest obstruction.   Original Report Authenticated By: Burman Nieves, M.D.    Medications: I have reviewed the patient's current medications.  Assesment: Active Problems:   HYPERTHYROIDISM   HYPERTENSION   PERIPHERAL VASCULAR DISEASE   Diarrhea c. Diff colitis Lt wrist swelling hypokalemia    Plan: Continue flagyl K+ supplement Orthopedic consult appreciated.     LOS: 2 days   Grae Leathers 05/25/2013, 7:37 AM

## 2013-05-26 ENCOUNTER — Encounter (HOSPITAL_COMMUNITY): Payer: Self-pay | Admitting: Gastroenterology

## 2013-05-26 DIAGNOSIS — A0472 Enterocolitis due to Clostridium difficile, not specified as recurrent: Secondary | ICD-10-CM

## 2013-05-26 LAB — BASIC METABOLIC PANEL
BUN: 5 mg/dL — ABNORMAL LOW (ref 6–23)
Chloride: 95 mEq/L — ABNORMAL LOW (ref 96–112)
Creatinine, Ser: 0.73 mg/dL (ref 0.50–1.10)
GFR calc Af Amer: >90 mL/min (ref >90)

## 2013-05-26 MED ORDER — SACCHAROMYCES BOULARDII 250 MG PO CAPS
250.0000 mg | ORAL_CAPSULE | Freq: Two times a day (BID) | ORAL | Status: DC
  Administered 2013-05-26 – 2013-05-27 (×3): 250 mg via ORAL
  Filled 2013-05-26 (×3): qty 1

## 2013-05-26 MED ORDER — FAMOTIDINE 20 MG PO TABS
20.0000 mg | ORAL_TABLET | Freq: Every day | ORAL | Status: DC
  Administered 2013-05-26 – 2013-05-27 (×2): 20 mg via ORAL
  Filled 2013-05-26 (×2): qty 1

## 2013-05-26 MED ORDER — POTASSIUM CHLORIDE 10 MEQ/100ML IV SOLN
10.0000 meq | INTRAVENOUS | Status: AC
  Administered 2013-05-26 (×3): 10 meq via INTRAVENOUS
  Filled 2013-05-26: qty 400

## 2013-05-26 NOTE — Progress Notes (Signed)
Pt was given Ativan at 1328, pt resting off and on this afternoon. At this time, NT informed me that pt was quite confused. Upon further assessing pt I agreed that pt was more confused. She was able to tell me her name, that she was in the hospital, and the year. I notified Dr. Felecia Shelling that pt was confused, saying some things that did not make sense, but that she appeared to be returning to her baseline. No new orders received. Sheryn Bison

## 2013-05-26 NOTE — Progress Notes (Signed)
    COPY OF PATH REPORT FROM HEMICOLECTOMY. Will attempt to scan into epic as well under "media".

## 2013-05-26 NOTE — Consult Note (Signed)
Referring Provider:  Dr. Felecia Shelling Primary Care Physician:  Dr. Felecia Shelling Primary Gastroenterologist:  Dr. Darrick Penna   Date of Admission: 05/23/13 Date of Consultation: 05/26/13  Reason for Consultation:  Cdiff colitis  HPI:  [name] is a pleasant 79 year old female who presented to the ED on 7/6 with diarrhea and dehydration. Stool studies revealed positive Cdiff, stool culture negative this far. She was recently inpatient at [facility] due to left hand/wrist swelling. She received antibiotics while inpatient at [facility]; she was treated with Cleocin IV and then transitioned to oral.  She notes acute onset of loose stools after discharge from [facility]. Lost appetite. States when she was eating solids, less diarrhea. Liquids cause her to "have to go". Thinks she has 3-4 loose stools in the morning. Watery, sometimes formed. No rectal bleeding. No abdominal pain. Associated nausea, no vomiting. She feels that she has improved since admission.   As of note, her last colonoscopy was in 2010 by Dr. Darrick Penna. Findings revealed a large cecal mass; she underwent a right hemicolectomy by Dr. Corliss Skains shortly thereafter, with path negative for malignancy.    Past Medical History  Diagnosis Date  . Hypertension   . Arthritis   . Hypothyroidism   . Gastritis, Helicobacter pylori     Treated with Pylera, 2010    Past Surgical History  Procedure Laterality Date  . Cervical fusion    . Abdominal hysterectomy    . Joint replacement      bilateral knee replacement, left shoulder surgery  . Colon surgery  2010    Dr. Corliss Skains: secondary to cecal mass. Negative for malignancy  . Carpal tunnel release    . Colonoscopy  May 2010    Dr. Darrick Penna: large cecal mass, referred to Dr. Corliss Skains  . Esophagogastroduodenoscopy  May 2010    normal esophagus, no Barrett's, moderate gastritis, +H.PYLORI    Prior to Admission medications   Medication Sig Start Date End Date Taking? Authorizing Provider  aspirin 81 MG chewable tablet Chew 81 mg by  mouth daily.   Yes Historical Provider, MD  celecoxib (CELEBREX) 100 MG capsule Take 100 mg by mouth daily.   Yes Historical Provider, MD  cholecalciferol (VITAMIN D) 400 UNITS TABS Take 400 Units by mouth 2 (two) times daily.   Yes Historical Provider, MD  cilostazol (PLETAL) 100 MG tablet Take 100 mg by mouth 2 (two) times daily.   Yes Historical Provider, MD  felodipine (PLENDIL) 10 MG 24 hr tablet Take 10 mg by mouth daily.   Yes Historical Provider, MD  furosemide (LASIX) 40 MG tablet Take 1 tablet (40 mg total) by mouth daily. 05/12/13  Yes Annett Gula, MD  lansoprazole (PREVACID) 30 MG capsule Take 30 mg by mouth daily.   Yes Historical Provider, MD  levothyroxine (SYNTHROID, LEVOTHROID) 100 MCG tablet Take 100 mcg by mouth daily before breakfast.   Yes Historical Provider, MD  LORazepam (ATIVAN) 0.5 MG tablet Take 1 tablet (0.5 mg total) by mouth 2 (two) times daily. 05/12/13  Yes Annett Gula, MD  metoprolol (LOPRESSOR) 100 MG tablet Take 100 mg by mouth 2 (two) times daily.   Yes Historical Provider, MD  potassium chloride SA (K-DUR,KLOR-CON) 20 MEQ tablet Take 20 mEq by mouth daily.   Yes Historical Provider, MD    Current Facility-Administered Medications  Medication Dose Route Frequency Provider Last Rate Last Dose  . 0.9 %  sodium chloride infusion   Intravenous Continuous Meredeth Ide, MD 75 mL/hr at 05/26/13 0546    .  acetaminophen (TYLENOL) tablet 650 mg  650 mg Oral Q6H PRN Meredeth Ide, MD   650 mg at 05/25/13 1331   Or  . acetaminophen (TYLENOL) suppository 650 mg  650 mg Rectal Q6H PRN Meredeth Ide, MD      . antiseptic oral rinse (BIOTENE) solution 15 mL  15 mL Mouth Rinse q12n4p Avon Gully, MD   15 mL at 05/26/13 1126  . aspirin chewable tablet 81 mg  81 mg Oral Daily Meredeth Ide, MD   81 mg at 05/26/13 0904  . chlorhexidine (PERIDEX) 0.12 % solution 15 mL  15 mL Mouth Rinse BID Avon Gully, MD   15 mL at 05/26/13 0903  . cilostazol (PLETAL) tablet 100 mg  100 mg  Oral BID Meredeth Ide, MD   100 mg at 05/26/13 0904  . enoxaparin (LOVENOX) injection 40 mg  40 mg Subcutaneous Q24H Meredeth Ide, MD   40 mg at 05/25/13 2138  . felodipine (PLENDIL) 24 hr tablet 10 mg  10 mg Oral Daily Meredeth Ide, MD   10 mg at 05/26/13 0906  . levothyroxine (SYNTHROID, LEVOTHROID) tablet 100 mcg  100 mcg Oral QAC breakfast Meredeth Ide, MD   100 mcg at 05/26/13 0904  . LORazepam (ATIVAN) tablet 0.5 mg  0.5 mg Oral BID PRN Meredeth Ide, MD   0.5 mg at 05/25/13 2137  . metoprolol (LOPRESSOR) tablet 100 mg  100 mg Oral BID Meredeth Ide, MD   100 mg at 05/26/13 0905  . metroNIDAZOLE (FLAGYL) tablet 500 mg  500 mg Oral Q8H Fredirick Maudlin, MD   500 mg at 05/26/13 0545  . ondansetron (ZOFRAN) tablet 4 mg  4 mg Oral Q6H PRN Meredeth Ide, MD       Or  . ondansetron (ZOFRAN) injection 4 mg  4 mg Intravenous Q6H PRN Meredeth Ide, MD   4 mg at 05/26/13 0230  . pantoprazole (PROTONIX) injection 40 mg  40 mg Intravenous Q24H Meredeth Ide, MD   40 mg at 05/25/13 2139  . potassium chloride 10 mEq in 100 mL IVPB  10 mEq Intravenous Q1 Hr x 3 Tesfaye Fanta, MD   10 mEq at 05/26/13 1125  . potassium chloride SA (K-DUR,KLOR-CON) CR tablet 20 mEq  20 mEq Oral Daily Meredeth Ide, MD   20 mEq at 05/26/13 0906    Allergies as of 05/23/2013 - Review Complete 05/23/2013  Allergen Reaction Noted  . Amoxicillin Other (See Comments) 12/23/2006  . Penicillins Other (See Comments) 12/23/2006     History   Social History  . Marital Status: Widowed    Spouse Name: N/A    Number of Children: N/A  . Years of Education: N/A   Occupational History  . Not on file.   Social History Main Topics  . Smoking status: Never Smoker   . Smokeless tobacco: Not on file  . Alcohol Use: No  . Drug Use: No  . Sexually Active: No   Other Topics Concern  . Not on file   Social History Narrative   Widowed, 2 daughters    Lives in [place]   Denies EtOH, smoking     Review of  Systems: Gen: +weakness, fatigue.  CV: Denies chest pain, heart palpitations, syncope, edema  Resp: Denies shortness of breath with rest, cough, wheezing GI: Denies dysphagia or odynophagia. Denies vomiting blood, jaundice, and fecal incontinence.  GU : Denies urinary burning, urinary  frequency, urinary incontinence.  MS: +joint pain, arthritis Derm: Denies rash, itching, dry skin Psych: +depression "lately" Heme: Denies bruising, bleeding, and enlarged lymph nodes.  Physical Exam: Vital signs in last 24 hours: Temp:  [98.3 F (36.8 C)-98.7 F (37.1 C)] 98.6 F (37 C) (07/09 0434) Pulse Rate:  [62-75] 62 (07/09 0434) Resp:  [18-20] 19 (07/09 0434) BP: (90-124)/(57-71) 100/66 mmHg (07/09 0905) SpO2:  [94 %-96 %] 94 % (07/09 0434) Last BM Date: 05/26/13 General:   Alert,  Well-developed, well-nourished, pleasant and cooperative in NAD Head:  Normocephalic and atraumatic. Eyes:  Sclera clear, no icterus.   Conjunctiva pink. Ears:  Normal auditory acuity. Nose:  No deformity, discharge,  or lesions. Mouth:  Edentulous, no deformity or lesions.  Neck:  Supple; no masses or thyromegaly. Lungs:  Clear throughout to auscultation.   No wheezes, crackles, or rhonchi. No acute distress. Heart:  S1, S2 present; no murmurs, clicks, rubs,  or gallops. Abdomen:  Soft, very mild tenderness to palpation lower abdomen, and nondistended. No masses, hepatosplenomegaly or hernias noted. Normal bowel sounds, without guarding, and without rebound.   Rectal:  Deferred  Msk:  Symmetrical without gross deformities. Normal posture. Pulses:  Normal pulses noted. Extremities:  Without clubbing or edema. Neurologic:  Alert and  oriented x4;  grossly normal neurologically. Skin:  Intact without significant lesions or rashes. Cervical Nodes:  No significant cervical adenopathy. Psych:  Alert and cooperative. Normal mood and affect.  Intake/Output from previous day: 07/08 0701 - 07/09 0700 In: 360  [P.O.:360] Out: -  Intake/Output this shift: Total I/O In: 3871.3 [I.V.:3871.3] Out: -   Lab Results:  Recent Labs  05/23/13 1707 05/24/13 0459  WBC 14.2* 12.5*  HGB 12.6 11.5*  HCT 35.4* 33.3*  PLT 462* 404*   BMET  Recent Labs  05/24/13 0459 05/25/13 0550 05/26/13 0553  NA 129* 127* 126*  K 2.8* 2.9* 3.3*  CL 91* 92* 95*  CO2 26 26 22   GLUCOSE 110* 101* 90  BUN 4* 4* 5*  CREATININE 0.78 0.71 0.73  CALCIUM 8.7 8.7 8.5   LFT  Recent Labs  05/24/13 0459  PROT 6.5  ALBUMIN 2.9*  AST 15  ALT 15  ALKPHOS 54  BILITOT 0.4     Studies/Results: ABD XRAY 2 VIEW:Technically limited study due to poor penetration. No evidence of small or large bowel distension to suggest obstruction.  Impression: 79 year old pleasant female admitted with Cdiff colitis, overall improving from admission. Leukocytosis improving, no CBC on file from today. Continues to have 3-4 loose stools daily but no rectal bleeding; oral Flagyl began July 8th. Risk factors included recent hospitalization, antibiotic exposure, outpatient PPI. She desires to advance her diet, which is appropriate. Will order soft diet due to lack of dentures and request that she remain on a low-residue diet for now. As she seems to be responding well, recommend therapy as already commenced with Flagyl. Add probiotic, change PPI to Pepcid while treating for Cdiff.   As of note, last colonoscopy in 2010 with large cecal mass that required right hemicolectomy. Path was negative for malignancy.   Plan: Continue Flagyl TID Change Protonix to Pepcid Add probiotic (florastor) Start soft mechanical diet, low-residue Check CBC in am   Nira Retort, ANP-BC [facility]  11:49 AM   LOS: 3 days    05/26/2013, 11:43 AM  Attending note:  Patient seen and examined. She does not appear at all toxic. Renal function remains normal. Agree with the impression and  recommendations as outlined above.

## 2013-05-26 NOTE — Progress Notes (Signed)
Subjective: Patient still has frequent diarrhea. Her K+ is still low. She is on flagyl and IV fluid.  Objective: Vital signs in last 24 hours: Temp:  [98.3 F (36.8 C)-98.7 F (37.1 C)] 98.6 F (37 C) (07/09 0434) Pulse Rate:  [62-75] 62 (07/09 0434) Resp:  [18-20] 19 (07/09 0434) BP: (90-124)/(57-71) 109/68 mmHg (07/09 0434) SpO2:  [94 %-96 %] 94 % (07/09 0434) Weight change:  Last BM Date: 05/25/13  Intake/Output from previous day: 07/08 0701 - 07/09 0700 In: 360 [P.O.:360] Out: -   PHYSICAL EXAM General appearance: alert and no distress Resp: clear to auscultation bilaterally Cardio: S1, S2 normal GI: soft, non-tender; bowel sounds normal; no masses,  no organomegaly Extremities: tender swelling of the LT wrist  Lab Results:    @labtest @ [facility] No results found for this basename: PHART, PCO2, PO2ART, TCO2, HCO3,  in the last 72 hours CULTURES Recent Results (from the past 240 hour(s))  CLOSTRIDIUM DIFFICILE BY PCR     Status: Abnormal   Collection Time    05/24/13  1:37 AM      Result Value Range Status   C difficile by pcr POSITIVE (*) NEGATIVE Final   Comment: CRITICAL RESULT CALLED TO, READ BACK BY AND VERIFIED WITH:      WALKER,L @ 0319 ON 05/24/13 BY WOODIE,J  STOOL CULTURE     Status: None   Collection Time    05/24/13  4:50 AM      Result Value Range Status   Specimen Description STOOL   Final   Special Requests NONE   Final   Culture Culture reincubated for better growth   Final   Report Status PENDING   Incomplete   Studies/Results: No results found.  Medications: I have reviewed the patient's current medications.  Assesment: Active Problems:   HYPERTHYROIDISM   HYPERTENSION   PERIPHERAL VASCULAR DISEASE   Diarrhea c. Diff colitis Lt wrist swelling hypokalemia    Plan: Continue flagyl K+ supplement Will do GI Consult.     LOS: 3 days   Vonnetta Akey 05/26/2013, 7:35 AM

## 2013-05-27 LAB — BASIC METABOLIC PANEL
BUN: <3 mg/dL — ABNORMAL LOW (ref 6–23)
CO2: 27 mEq/L (ref 19–32)
Calcium: 8.5 mg/dL (ref 8.4–10.5)
Creatinine, Ser: 0.68 mg/dL (ref 0.50–1.10)

## 2013-05-27 LAB — CBC
MCH: 28.4 pg (ref 26.0–34.0)
MCHC: 35.1 g/dL (ref 30.0–36.0)
MCV: 80.8 fL (ref 78.0–100.0)
Platelets: 410 10*3/uL — ABNORMAL HIGH (ref 150–400)
RDW: 14.9 % (ref 11.5–15.5)
WBC: 10.1 10*3/uL (ref 4.0–10.5)

## 2013-05-27 MED ORDER — POTASSIUM CHLORIDE CRYS ER 20 MEQ PO TBCR: 20.0000 meq | EXTENDED_RELEASE_TABLET | Freq: Two times a day (BID) | ORAL | Status: DC

## 2013-05-27 MED ORDER — METRONIDAZOLE 500 MG PO TABS: 500.0000 mg | ORAL_TABLET | Freq: Three times a day (TID) | ORAL | Status: DC

## 2013-05-27 MED ORDER — SACCHAROMYCES BOULARDII 250 MG PO CAPS: 250.0000 mg | ORAL_CAPSULE | Freq: Two times a day (BID) | ORAL | Status: DC

## 2013-05-27 NOTE — Progress Notes (Signed)
Pt and her daughters verbalize understanding of d/c instructions, medications, and follow up appts. No questions at this time. I educated them on a low fiber diet, and they express understanding. IV d/c without complications. Her left hand and wrist was swollen; however, pt states that it has recently done this, and it gets worse with activity. She states that she feels it is from having to pull herself up so frequently. Heat pack applied, pt to reapply her brace at d/c. Pt d/c via wheelchair, accompanied by NT, and her family. Sheryn Bison

## 2013-05-27 NOTE — Discharge Summary (Signed)
Physician Discharge Summary  Patient ID: [name] MRN: [id number] DOB/AGE: 01-20-1929 79 y.o. Primary Care Physician:Mell Mellott, MD Admit date: 05/23/2013 Discharge date: 05/27/2013    Discharge Diagnoses:   Active Problems:   HYPERTHYROIDISM   HYPERTENSION   PERIPHERAL VASCULAR DISEASE   Diarrhea C.diffi colitis Hypokalemia hyponatremia    Medication List         aspirin 81 MG chewable tablet  Chew 81 mg by mouth daily.     celecoxib 100 MG capsule  Commonly known as:  CELEBREX  Take 100 mg by mouth daily.     cholecalciferol 400 UNITS Tabs  Commonly known as:  VITAMIN D  Take 400 Units by mouth 2 (two) times daily.     cilostazol 100 MG tablet  Commonly known as:  PLETAL  Take 100 mg by mouth 2 (two) times daily.     felodipine 10 MG 24 hr tablet  Commonly known as:  PLENDIL  Take 10 mg by mouth daily.     furosemide 40 MG tablet  Commonly known as:  LASIX  Take 1 tablet (40 mg total) by mouth daily.     lansoprazole 30 MG capsule  Commonly known as:  PREVACID  Take 30 mg by mouth daily.     levothyroxine 100 MCG tablet  Commonly known as:  SYNTHROID, LEVOTHROID  Take 100 mcg by mouth daily before breakfast.     LORazepam 0.5 MG tablet  Commonly known as:  ATIVAN  Take 1 tablet (0.5 mg total) by mouth 2 (two) times daily.     metoprolol 100 MG tablet  Commonly known as:  LOPRESSOR  Take 100 mg by mouth 2 (two) times daily.     metroNIDAZOLE 500 MG tablet  Commonly known as:  FLAGYL  Take 1 tablet (500 mg total) by mouth every 8 (eight) hours.     potassium chloride SA 20 MEQ tablet  Commonly known as:  K-DUR,KLOR-CON  Take 1 tablet (20 mEq total) by mouth 2 (two) times daily.     saccharomyces boulardii 250 MG capsule  Commonly known as:  FLORASTOR  Take 1 capsule (250 mg total) by mouth 2 (two) times daily.        Discharged Condition: improved    Consults: GI  Significant Diagnostic Studies: Dg Wrist Complete  Left  05/11/2013   *RADIOLOGY REPORT*  Clinical Data: Left wrist pain, swelling, erythema, and warmth. Possible cellulitis.  Limited range of motion.  No known injuries.  LEFT WRIST - COMPLETE 3+ VIEW  Comparison: None.  Findings: No evidence of acute or subacute fracture.  Slight widening of the joint space between the scaphoid and the lunate. Severe radiocarpal joint space narrowing and severe joint space narrowing involving the scaphoid - trapezium, scaphoid - trapezoid, trapezium - first metacarpal, and trapezoid - second metacarpal joints with associated hypertrophic spurring.  Moderate generalized osseous demineralization.  IMPRESSION: No acute or subacute osseous abnormality.  Severe osteoarthritis. Widening of the joint space between the scaphoid and the lunate raises the question of scapholunate ligament tear.   Original Report Authenticated By: Hulan Saas, M.D.   Dg Abd 2 Views  05/23/2013   *RADIOLOGY REPORT*  Clinical Data: Abdominal distension and diarrhea.  ABDOMEN - 2 VIEW  Comparison: CT abdomen and pelvis 04/16/2009  Findings: Technically limited study due to underpenetration on the upright view and overpenetration on the supine views.  There is no evidence of significant small or large bowel distension.  No free intra-abdominal air is demonstrated.  Scoliosis and degenerative changes in the spine.  Vascular calcifications.  IMPRESSION: Technically limited study due to poor penetration.  No evidence of small or large bowel distension to suggest obstruction.   Original Report Authenticated By: Burman Nieves, M.D.    Lab Results: Basic Metabolic Panel:  Recent Labs  16/10/96 0553 05/27/13 0614  NA 126* 130*  K 3.3* 3.0*  CL 95* 96  CO2 22 27  GLUCOSE 90 94  BUN 5* <3*  CREATININE 0.73 0.68  CALCIUM 8.5 8.5   Liver Function Tests: No results found for this basename: AST, ALT, ALKPHOS, BILITOT, PROT, ALBUMIN,  in the last 72 hours   CBC:  Recent Labs  05/27/13 0614   WBC 10.1  HGB 11.2*  HCT 31.9*  MCV 80.8  PLT 410*    Recent Results (from the past 240 hour(s))  CLOSTRIDIUM DIFFICILE BY PCR     Status: Abnormal   Collection Time    05/24/13  1:37 AM      Result Value Range Status   C difficile by pcr POSITIVE (*) NEGATIVE Final   Comment: CRITICAL RESULT CALLED TO, READ BACK BY AND VERIFIED WITH:      WALKER,L @ 0319 ON 05/24/13 BY WOODIE,J  STOOL CULTURE     Status: None   Collection Time    05/24/13  4:50 AM      Result Value Range Status   Specimen Description STOOL   Final   Special Requests NONE   Final   Culture NO SUSPICIOUS COLONIES, CONTINUING TO HOLD   Final   Report Status PENDING   Incomplete     Hospital Course:  This is an 79 years old female patient with history of multiple medical illnesses who was admitted due  To nausea, abdominal pain and frequent diarrhea. Her stool culture grew C.diffi.She also had hypokalemia and hyponatremia. She received IV fluid and and flgyl. Patient improved and discharged home in stable condition.   Discharge Exam: Blood pressure 119/76, pulse 75, temperature 98.2 F (36.8 C), temperature source Oral, resp. rate 20, height 5\' 1"  (1.549 m), weight 75.9 kg (167 lb 5.3 oz), SpO2 99.00%.    Disposition:  Home         Follow-up Information   Follow up with [facility], MD.   Contact information:   [address] [place] [address] [phone]       Signed: Khameron Gruenwald   05/27/2013, 7:57 AM

## 2013-05-27 NOTE — Care Management Note (Signed)
    Page 1 of 1   05/27/2013     8:40:52 AM   CARE MANAGEMENT NOTE 05/27/2013  Patient:  [name], [name]   Account Number:  [id number]  Date Initiated:  05/27/2013  Documentation initiated by:  Rosemary Holms  Subjective/Objective Assessment:   Pt admitted from home. Daughters at bedside. Pt states she does not currently need equipment at home. She will call Dr. Felecia Shelling if she gets home and needs anything     Action/Plan:   Anticipated DC Date:  05/27/2013   Anticipated DC Plan:  HOME/SELF CARE         Choice offered to / List presented to:             Status of service:  Completed, signed off Medicare Important Message given?   (If response is "NO", the following Medicare IM given date fields will be blank) Date Medicare IM given:   Date Additional Medicare IM given:    Discharge Disposition:    Per UR Regulation:    If discussed at Long Length of Stay Meetings, dates discussed:    Comments:  05/27/13 Rosemary Holms RN BSN CM

## 2013-05-28 LAB — STOOL CULTURE

## 2014-04-29 ENCOUNTER — Emergency Department (HOSPITAL_COMMUNITY)
Admission: EM | Admit: 2014-04-29 | Discharge: 2014-04-29 | Disposition: A | Discharge status: Home / Self Care | Payer: Medicare Other | Attending: Emergency Medicine | Admitting: Emergency Medicine

## 2014-04-29 ENCOUNTER — Encounter (HOSPITAL_COMMUNITY): Payer: Self-pay | Admitting: Emergency Medicine

## 2014-04-29 ENCOUNTER — Emergency Department (HOSPITAL_COMMUNITY): Payer: Medicare Other

## 2014-04-29 DIAGNOSIS — I10 Essential (primary) hypertension: Secondary | ICD-10-CM | POA: Insufficient documentation

## 2014-04-29 DIAGNOSIS — Z9889 Other specified postprocedural states: Secondary | ICD-10-CM | POA: Insufficient documentation

## 2014-04-29 DIAGNOSIS — Z880 Allergy status to penicillin: Secondary | ICD-10-CM | POA: Insufficient documentation

## 2014-04-29 DIAGNOSIS — K2940 Chronic atrophic gastritis without bleeding: Secondary | ICD-10-CM | POA: Insufficient documentation

## 2014-04-29 DIAGNOSIS — Z8619 Personal history of other infectious and parasitic diseases: Secondary | ICD-10-CM | POA: Insufficient documentation

## 2014-04-29 DIAGNOSIS — Z791 Long term (current) use of non-steroidal anti-inflammatories (NSAID): Secondary | ICD-10-CM | POA: Insufficient documentation

## 2014-04-29 DIAGNOSIS — Z79899 Other long term (current) drug therapy: Secondary | ICD-10-CM | POA: Insufficient documentation

## 2014-04-29 DIAGNOSIS — Z7982 Long term (current) use of aspirin: Secondary | ICD-10-CM | POA: Insufficient documentation

## 2014-04-29 DIAGNOSIS — E039 Hypothyroidism, unspecified: Secondary | ICD-10-CM | POA: Insufficient documentation

## 2014-04-29 DIAGNOSIS — M19019 Primary osteoarthritis, unspecified shoulder: Secondary | ICD-10-CM | POA: Insufficient documentation

## 2014-04-29 MED ORDER — IBUPROFEN 400 MG PO TABS
400.0000 mg | ORAL_TABLET | Freq: Once | ORAL | Status: AC
  Administered 2014-04-29: 400 mg via ORAL
  Filled 2014-04-29: qty 1

## 2014-04-29 MED ORDER — HYDROCODONE-ACETAMINOPHEN 5-325 MG PO TABS: 10.0000 | ORAL_TABLET | Freq: Four times a day (QID) | ORAL | Status: DC | PRN

## 2014-04-29 MED ORDER — IBUPROFEN 400 MG PO TABS: 400.0000 mg | ORAL_TABLET | Freq: Four times a day (QID) | ORAL | Status: DC | PRN

## 2014-04-29 MED ORDER — HYDROCODONE-ACETAMINOPHEN 5-325 MG PO TABS
2.0000 | ORAL_TABLET | Freq: Once | ORAL | Status: AC
  Administered 2014-04-29: 2 via ORAL
  Filled 2014-04-29: qty 2

## 2014-04-29 NOTE — ED Provider Notes (Signed)
CSN: [id number]     Arrival date & time 04/29/14  0456 History   First MD Initiated Contact with Patient 04/29/14 0504     Chief Complaint  Patient presents with  . Shoulder Pain     (Consider location/radiation/quality/duration/timing/severity/associated sxs/prior Treatment) HPI Comments: PT with chronic right sided shoulder pain secondary to severe arthritis comes in with right shoulder pain. Reports that she started having pain y'day during the afternoon, which has slowly gotten worse. Pain acutely got worse last night, while she was changing. Pain worse with any movement, and pt was unable to sleep at night, so decided to come to the ER. Denies any n/v/f/c. Pt has numbness in her hand- but it is not new.  Patient is a 80 y.o. female presenting with shoulder pain. The history is provided by the patient.  Shoulder Pain Pertinent negatives include no chest pain, no abdominal pain, no headaches and no shortness of breath.    Past Medical History  Diagnosis Date  . Hypertension   . Arthritis   . Hypothyroidism   . Gastritis, Helicobacter pylori     Treated with Pylera, 2010   Past Surgical History  Procedure Laterality Date  . Cervical fusion    . Abdominal hysterectomy    . Joint replacement      bilateral knee replacement, left shoulder surgery  . Colon surgery  2010    Dr. Georgette Dover: secondary to cecal mass. Negative for malignancy  . Carpal tunnel release    . Colonoscopy  May 2010    Dr. Oneida Alar: large cecal mass, referred to Dr. Georgette Dover  . Esophagogastroduodenoscopy  May 2010    normal esophagus, no Barrett's, moderate gastritis, +H.PYLORI   Family History  Problem Relation Age of Onset  . Colon cancer Neg Hx    History  Substance Use Topics  . Smoking status: Never Smoker   . Smokeless tobacco: Not on file  . Alcohol Use: No   OB History   Grav Para Term Preterm Abortions TAB SAB Ect Mult Living                 Review of Systems  Constitutional: Positive for  activity change.  Respiratory: Negative for shortness of breath.   Cardiovascular: Negative for chest pain.  Gastrointestinal: Negative for nausea, vomiting and abdominal pain.  Genitourinary: Negative for dysuria.  Musculoskeletal: Positive for arthralgias. Negative for neck pain.  Neurological: Negative for headaches.      Allergies  Amoxicillin and Penicillins  Home Medications   Prior to Admission medications   Medication Sig Start Date End Date Taking? Authorizing Provider  aspirin 81 MG chewable tablet Chew 81 mg by mouth daily.    Historical Provider, MD  celecoxib (CELEBREX) 100 MG capsule Take 100 mg by mouth daily.    Historical Provider, MD  cholecalciferol (VITAMIN D) 400 UNITS TABS Take 400 Units by mouth 2 (two) times daily.    Historical Provider, MD  cilostazol (PLETAL) 100 MG tablet Take 100 mg by mouth 2 (two) times daily.    Historical Provider, MD  felodipine (PLENDIL) 10 MG 24 hr tablet Take 10 mg by mouth daily.    Historical Provider, MD  furosemide (LASIX) 40 MG tablet Take 1 tablet (40 mg total) by mouth daily. 05/12/13   Cresenciano Genre, MD  HYDROcodone-acetaminophen (NORCO/VICODIN) 5-325 MG per tablet Take 10 tablets by mouth every 6 (six) hours as needed for severe pain. 04/29/14   Varney Biles, MD  ibuprofen (ADVIL,MOTRIN) 400 MG  tablet Take 1 tablet (400 mg total) by mouth every 6 (six) hours as needed. 04/29/14   Varney Biles, MD  lansoprazole (PREVACID) 30 MG capsule Take 30 mg by mouth daily.    Historical Provider, MD  levothyroxine (SYNTHROID, LEVOTHROID) 100 MCG tablet Take 100 mcg by mouth daily before breakfast.    Historical Provider, MD  LORazepam (ATIVAN) 0.5 MG tablet Take 1 tablet (0.5 mg total) by mouth 2 (two) times daily. 05/12/13   Cresenciano Genre, MD  metoprolol (LOPRESSOR) 100 MG tablet Take 100 mg by mouth 2 (two) times daily.    Historical Provider, MD  metroNIDAZOLE (FLAGYL) 500 MG tablet Take 1 tablet (500 mg total) by mouth every 8  (eight) hours. 05/27/13   Rosita Fire, MD  potassium chloride SA (K-DUR,KLOR-CON) 20 MEQ tablet Take 1 tablet (20 mEq total) by mouth 2 (two) times daily. 05/27/13   Rosita Fire, MD  saccharomyces boulardii (FLORASTOR) 250 MG capsule Take 1 capsule (250 mg total) by mouth 2 (two) times daily. 05/27/13   Rosita Fire, MD   BP 160/79  Pulse 97  Temp(Src) 98.1 F (36.7 C)  Resp 18  Ht 5\' 4"  (1.626 m)  Wt 186 lb (84.369 kg)  BMI 31.91 kg/m2  SpO2 97% Physical Exam  Nursing note and vitals reviewed. Constitutional: She is oriented to person, place, and time. She appears well-developed and well-nourished.  HENT:  Head: Normocephalic and atraumatic.  Eyes: EOM are normal. Pupils are equal, round, and reactive to light.  Neck: Neck supple.  Cardiovascular: Normal rate, regular rhythm and normal heart sounds.   No murmur heard. Pulmonary/Chest: Effort normal. No respiratory distress.  Abdominal: Soft. She exhibits no distension. There is no tenderness. There is no rebound and no guarding.  Musculoskeletal:  Right shoulder tenderness to palpation, and with minimal movement. No joint swelling, or erythema or warmth.  Neurological: She is alert and oriented to person, place, and time.  Skin: Skin is warm and dry.    ED Course  Procedures (including critical care time) Labs Review Labs Reviewed - No data to display  Imaging Review Dg Shoulder Right  04/29/2014   CLINICAL DATA:  Right shoulder pain.  No known injury.  EXAM: RIGHT SHOULDER - 2+ VIEW  COMPARISON:  04/29/2014  FINDINGS: Prominent hypertrophic changes in the right shoulder with bone resorption and deformity of the glenoid and of the acromioclavicular joint. Flattening and deformity of these humeral head. This could indicate chronic degenerative and erosive arthritic change. Infection is not excluded. No dislocation or acute fracture.  IMPRESSION: Prominent productive and erosive arthritic changes in the glenohumeral and  acromioclavicular joints with flattening of the humeral head.   Electronically Signed   By: Lucienne Capers M.D.   On: 04/29/2014 06:34     EKG Interpretation None      MDM   Final diagnoses:  Shoulder arthritis    Pt with shoulder pain. Hx of severe arthritis. Current Xrays shows severe arthritis. Hx and exam not suggestive of joint infection for this acute on chronic shoulder pain. Ortho f/u provided - for better pain control and definitive therapy options.   Varney Biles, MD 04/29/14 [phone]

## 2014-04-29 NOTE — Discharge Instructions (Signed)

## 2014-04-29 NOTE — ED Notes (Signed)
Pt was putting on nightgown and rt arm got hung up and injured.

## 2014-05-19 ENCOUNTER — Encounter: Payer: Self-pay | Admitting: Orthopedic Surgery

## 2014-05-19 ENCOUNTER — Ambulatory Visit: Payer: Medicare Other | Admitting: Orthopedic Surgery

## 2015-02-14 DIAGNOSIS — I10 Essential (primary) hypertension: Secondary | ICD-10-CM | POA: Diagnosis not present

## 2015-02-14 DIAGNOSIS — J069 Acute upper respiratory infection, unspecified: Secondary | ICD-10-CM | POA: Diagnosis not present

## 2015-02-14 DIAGNOSIS — K219 Gastro-esophageal reflux disease without esophagitis: Secondary | ICD-10-CM | POA: Diagnosis not present

## 2015-02-14 DIAGNOSIS — E039 Hypothyroidism, unspecified: Secondary | ICD-10-CM | POA: Diagnosis not present

## 2015-03-09 DIAGNOSIS — E876 Hypokalemia: Secondary | ICD-10-CM | POA: Diagnosis not present

## 2015-03-09 DIAGNOSIS — E890 Postprocedural hypothyroidism: Secondary | ICD-10-CM | POA: Diagnosis not present

## 2015-03-09 DIAGNOSIS — E539 Vitamin B deficiency, unspecified: Secondary | ICD-10-CM | POA: Diagnosis not present

## 2015-03-09 DIAGNOSIS — E785 Hyperlipidemia, unspecified: Secondary | ICD-10-CM | POA: Diagnosis not present

## 2015-03-09 DIAGNOSIS — E559 Vitamin D deficiency, unspecified: Secondary | ICD-10-CM | POA: Diagnosis not present

## 2015-03-17 DIAGNOSIS — I10 Essential (primary) hypertension: Secondary | ICD-10-CM | POA: Diagnosis not present

## 2015-03-17 DIAGNOSIS — E785 Hyperlipidemia, unspecified: Secondary | ICD-10-CM | POA: Diagnosis not present

## 2015-03-17 DIAGNOSIS — E890 Postprocedural hypothyroidism: Secondary | ICD-10-CM | POA: Diagnosis not present

## 2015-06-01 DIAGNOSIS — E039 Hypothyroidism, unspecified: Secondary | ICD-10-CM | POA: Diagnosis not present

## 2015-06-01 DIAGNOSIS — R2681 Unsteadiness on feet: Secondary | ICD-10-CM | POA: Diagnosis not present

## 2015-06-01 DIAGNOSIS — I10 Essential (primary) hypertension: Secondary | ICD-10-CM | POA: Diagnosis not present

## 2015-06-01 DIAGNOSIS — Z0001 Encounter for general adult medical examination with abnormal findings: Secondary | ICD-10-CM | POA: Diagnosis not present

## 2016-01-10 DIAGNOSIS — E039 Hypothyroidism, unspecified: Secondary | ICD-10-CM | POA: Diagnosis not present

## 2016-01-10 DIAGNOSIS — M170 Bilateral primary osteoarthritis of knee: Secondary | ICD-10-CM | POA: Diagnosis not present

## 2016-01-10 DIAGNOSIS — Z23 Encounter for immunization: Secondary | ICD-10-CM | POA: Diagnosis not present

## 2016-01-10 DIAGNOSIS — I10 Essential (primary) hypertension: Secondary | ICD-10-CM | POA: Diagnosis not present

## 2016-01-10 DIAGNOSIS — R609 Edema, unspecified: Secondary | ICD-10-CM | POA: Diagnosis not present

## 2016-01-10 DIAGNOSIS — E785 Hyperlipidemia, unspecified: Secondary | ICD-10-CM | POA: Diagnosis not present

## 2016-01-10 DIAGNOSIS — K219 Gastro-esophageal reflux disease without esophagitis: Secondary | ICD-10-CM | POA: Diagnosis not present

## 2016-03-15 DIAGNOSIS — E559 Vitamin D deficiency, unspecified: Secondary | ICD-10-CM | POA: Diagnosis not present

## 2016-03-15 DIAGNOSIS — E876 Hypokalemia: Secondary | ICD-10-CM | POA: Diagnosis not present

## 2016-03-15 DIAGNOSIS — E890 Postprocedural hypothyroidism: Secondary | ICD-10-CM | POA: Diagnosis not present

## 2016-03-15 DIAGNOSIS — E538 Deficiency of other specified B group vitamins: Secondary | ICD-10-CM | POA: Diagnosis not present

## 2016-05-01 DIAGNOSIS — E785 Hyperlipidemia, unspecified: Secondary | ICD-10-CM | POA: Diagnosis not present

## 2016-05-01 DIAGNOSIS — E538 Deficiency of other specified B group vitamins: Secondary | ICD-10-CM | POA: Diagnosis not present

## 2016-05-01 DIAGNOSIS — I10 Essential (primary) hypertension: Secondary | ICD-10-CM | POA: Diagnosis not present

## 2016-05-01 DIAGNOSIS — E890 Postprocedural hypothyroidism: Secondary | ICD-10-CM | POA: Diagnosis not present

## 2016-06-05 DIAGNOSIS — J4520 Mild intermittent asthma, uncomplicated: Secondary | ICD-10-CM | POA: Diagnosis not present

## 2016-06-05 DIAGNOSIS — E559 Vitamin D deficiency, unspecified: Secondary | ICD-10-CM | POA: Diagnosis not present

## 2016-06-05 DIAGNOSIS — M19042 Primary osteoarthritis, left hand: Secondary | ICD-10-CM | POA: Diagnosis not present

## 2016-06-05 DIAGNOSIS — K219 Gastro-esophageal reflux disease without esophagitis: Secondary | ICD-10-CM | POA: Diagnosis not present

## 2016-06-05 DIAGNOSIS — D519 Vitamin B12 deficiency anemia, unspecified: Secondary | ICD-10-CM | POA: Diagnosis not present

## 2016-06-05 DIAGNOSIS — E042 Nontoxic multinodular goiter: Secondary | ICD-10-CM | POA: Diagnosis not present

## 2016-06-05 DIAGNOSIS — I739 Peripheral vascular disease, unspecified: Secondary | ICD-10-CM | POA: Diagnosis not present

## 2016-06-05 DIAGNOSIS — I10 Essential (primary) hypertension: Secondary | ICD-10-CM | POA: Diagnosis not present

## 2016-06-05 DIAGNOSIS — E890 Postprocedural hypothyroidism: Secondary | ICD-10-CM | POA: Diagnosis not present

## 2016-06-05 DIAGNOSIS — M19041 Primary osteoarthritis, right hand: Secondary | ICD-10-CM | POA: Diagnosis not present

## 2016-06-05 DIAGNOSIS — E785 Hyperlipidemia, unspecified: Secondary | ICD-10-CM | POA: Diagnosis not present

## 2016-06-13 DIAGNOSIS — J4520 Mild intermittent asthma, uncomplicated: Secondary | ICD-10-CM | POA: Diagnosis not present

## 2016-06-13 DIAGNOSIS — M19041 Primary osteoarthritis, right hand: Secondary | ICD-10-CM | POA: Diagnosis not present

## 2016-06-13 DIAGNOSIS — M19042 Primary osteoarthritis, left hand: Secondary | ICD-10-CM | POA: Diagnosis not present

## 2016-06-13 DIAGNOSIS — E042 Nontoxic multinodular goiter: Secondary | ICD-10-CM | POA: Diagnosis not present

## 2016-06-13 DIAGNOSIS — E785 Hyperlipidemia, unspecified: Secondary | ICD-10-CM | POA: Diagnosis not present

## 2016-06-13 DIAGNOSIS — D519 Vitamin B12 deficiency anemia, unspecified: Secondary | ICD-10-CM | POA: Diagnosis not present

## 2016-06-13 DIAGNOSIS — K219 Gastro-esophageal reflux disease without esophagitis: Secondary | ICD-10-CM | POA: Diagnosis not present

## 2016-06-13 DIAGNOSIS — E559 Vitamin D deficiency, unspecified: Secondary | ICD-10-CM | POA: Diagnosis not present

## 2016-06-13 DIAGNOSIS — I10 Essential (primary) hypertension: Secondary | ICD-10-CM | POA: Diagnosis not present

## 2016-06-13 DIAGNOSIS — E890 Postprocedural hypothyroidism: Secondary | ICD-10-CM | POA: Diagnosis not present

## 2016-06-13 DIAGNOSIS — I739 Peripheral vascular disease, unspecified: Secondary | ICD-10-CM | POA: Diagnosis not present

## 2016-06-19 DIAGNOSIS — E042 Nontoxic multinodular goiter: Secondary | ICD-10-CM | POA: Diagnosis not present

## 2016-06-19 DIAGNOSIS — I10 Essential (primary) hypertension: Secondary | ICD-10-CM | POA: Diagnosis not present

## 2016-06-19 DIAGNOSIS — D519 Vitamin B12 deficiency anemia, unspecified: Secondary | ICD-10-CM | POA: Diagnosis not present

## 2016-06-19 DIAGNOSIS — E890 Postprocedural hypothyroidism: Secondary | ICD-10-CM | POA: Diagnosis not present

## 2016-06-19 DIAGNOSIS — M19041 Primary osteoarthritis, right hand: Secondary | ICD-10-CM | POA: Diagnosis not present

## 2016-06-19 DIAGNOSIS — K219 Gastro-esophageal reflux disease without esophagitis: Secondary | ICD-10-CM | POA: Diagnosis not present

## 2016-06-19 DIAGNOSIS — M19042 Primary osteoarthritis, left hand: Secondary | ICD-10-CM | POA: Diagnosis not present

## 2016-06-19 DIAGNOSIS — J4520 Mild intermittent asthma, uncomplicated: Secondary | ICD-10-CM | POA: Diagnosis not present

## 2016-06-19 DIAGNOSIS — E559 Vitamin D deficiency, unspecified: Secondary | ICD-10-CM | POA: Diagnosis not present

## 2016-06-19 DIAGNOSIS — E785 Hyperlipidemia, unspecified: Secondary | ICD-10-CM | POA: Diagnosis not present

## 2016-06-19 DIAGNOSIS — I739 Peripheral vascular disease, unspecified: Secondary | ICD-10-CM | POA: Diagnosis not present

## 2016-06-27 DIAGNOSIS — J4520 Mild intermittent asthma, uncomplicated: Secondary | ICD-10-CM | POA: Diagnosis not present

## 2016-06-27 DIAGNOSIS — E559 Vitamin D deficiency, unspecified: Secondary | ICD-10-CM | POA: Diagnosis not present

## 2016-06-27 DIAGNOSIS — I739 Peripheral vascular disease, unspecified: Secondary | ICD-10-CM | POA: Diagnosis not present

## 2016-06-27 DIAGNOSIS — E890 Postprocedural hypothyroidism: Secondary | ICD-10-CM | POA: Diagnosis not present

## 2016-06-27 DIAGNOSIS — E785 Hyperlipidemia, unspecified: Secondary | ICD-10-CM | POA: Diagnosis not present

## 2016-06-27 DIAGNOSIS — E042 Nontoxic multinodular goiter: Secondary | ICD-10-CM | POA: Diagnosis not present

## 2016-06-27 DIAGNOSIS — D519 Vitamin B12 deficiency anemia, unspecified: Secondary | ICD-10-CM | POA: Diagnosis not present

## 2016-06-27 DIAGNOSIS — M19041 Primary osteoarthritis, right hand: Secondary | ICD-10-CM | POA: Diagnosis not present

## 2016-06-27 DIAGNOSIS — K219 Gastro-esophageal reflux disease without esophagitis: Secondary | ICD-10-CM | POA: Diagnosis not present

## 2016-06-27 DIAGNOSIS — M19042 Primary osteoarthritis, left hand: Secondary | ICD-10-CM | POA: Diagnosis not present

## 2016-06-27 DIAGNOSIS — I10 Essential (primary) hypertension: Secondary | ICD-10-CM | POA: Diagnosis not present

## 2016-07-01 DIAGNOSIS — I10 Essential (primary) hypertension: Secondary | ICD-10-CM | POA: Diagnosis not present

## 2016-07-01 DIAGNOSIS — E039 Hypothyroidism, unspecified: Secondary | ICD-10-CM | POA: Diagnosis not present

## 2016-07-01 DIAGNOSIS — L139 Bullous disorder, unspecified: Secondary | ICD-10-CM | POA: Diagnosis not present

## 2016-07-08 DIAGNOSIS — L120 Bullous pemphigoid: Secondary | ICD-10-CM | POA: Diagnosis not present

## 2016-07-11 DIAGNOSIS — E890 Postprocedural hypothyroidism: Secondary | ICD-10-CM | POA: Diagnosis not present

## 2016-07-11 DIAGNOSIS — D519 Vitamin B12 deficiency anemia, unspecified: Secondary | ICD-10-CM | POA: Diagnosis not present

## 2016-07-11 DIAGNOSIS — J4520 Mild intermittent asthma, uncomplicated: Secondary | ICD-10-CM | POA: Diagnosis not present

## 2016-07-11 DIAGNOSIS — E042 Nontoxic multinodular goiter: Secondary | ICD-10-CM | POA: Diagnosis not present

## 2016-07-11 DIAGNOSIS — M19041 Primary osteoarthritis, right hand: Secondary | ICD-10-CM | POA: Diagnosis not present

## 2016-07-11 DIAGNOSIS — K219 Gastro-esophageal reflux disease without esophagitis: Secondary | ICD-10-CM | POA: Diagnosis not present

## 2016-07-11 DIAGNOSIS — I739 Peripheral vascular disease, unspecified: Secondary | ICD-10-CM | POA: Diagnosis not present

## 2016-07-11 DIAGNOSIS — E785 Hyperlipidemia, unspecified: Secondary | ICD-10-CM | POA: Diagnosis not present

## 2016-07-11 DIAGNOSIS — I10 Essential (primary) hypertension: Secondary | ICD-10-CM | POA: Diagnosis not present

## 2016-07-11 DIAGNOSIS — M19042 Primary osteoarthritis, left hand: Secondary | ICD-10-CM | POA: Diagnosis not present

## 2016-07-11 DIAGNOSIS — E559 Vitamin D deficiency, unspecified: Secondary | ICD-10-CM | POA: Diagnosis not present

## 2016-07-17 DIAGNOSIS — E785 Hyperlipidemia, unspecified: Secondary | ICD-10-CM | POA: Diagnosis not present

## 2016-07-17 DIAGNOSIS — I10 Essential (primary) hypertension: Secondary | ICD-10-CM | POA: Diagnosis not present

## 2016-07-17 DIAGNOSIS — E559 Vitamin D deficiency, unspecified: Secondary | ICD-10-CM | POA: Diagnosis not present

## 2016-07-17 DIAGNOSIS — E890 Postprocedural hypothyroidism: Secondary | ICD-10-CM | POA: Diagnosis not present

## 2016-07-17 DIAGNOSIS — M19042 Primary osteoarthritis, left hand: Secondary | ICD-10-CM | POA: Diagnosis not present

## 2016-07-17 DIAGNOSIS — D519 Vitamin B12 deficiency anemia, unspecified: Secondary | ICD-10-CM | POA: Diagnosis not present

## 2016-07-17 DIAGNOSIS — J4520 Mild intermittent asthma, uncomplicated: Secondary | ICD-10-CM | POA: Diagnosis not present

## 2016-07-17 DIAGNOSIS — E042 Nontoxic multinodular goiter: Secondary | ICD-10-CM | POA: Diagnosis not present

## 2016-07-17 DIAGNOSIS — M19041 Primary osteoarthritis, right hand: Secondary | ICD-10-CM | POA: Diagnosis not present

## 2016-07-17 DIAGNOSIS — K219 Gastro-esophageal reflux disease without esophagitis: Secondary | ICD-10-CM | POA: Diagnosis not present

## 2016-07-17 DIAGNOSIS — I739 Peripheral vascular disease, unspecified: Secondary | ICD-10-CM | POA: Diagnosis not present

## 2016-07-18 DIAGNOSIS — L120 Bullous pemphigoid: Secondary | ICD-10-CM | POA: Diagnosis not present

## 2021-01-08 ENCOUNTER — Other Ambulatory Visit: Payer: Self-pay

## 2021-01-08 ENCOUNTER — Emergency Department (HOSPITAL_COMMUNITY): Payer: Medicare Other

## 2021-01-08 ENCOUNTER — Inpatient Hospital Stay (HOSPITAL_COMMUNITY)
Admission: EM | Admit: 2021-01-08 | Discharge: 2021-01-17 | DRG: 467 | Disposition: A | Discharge status: Skilled Nursing Facility | Payer: Medicare Other | Attending: Internal Medicine | Admitting: Internal Medicine

## 2021-01-08 DIAGNOSIS — K2970 Gastritis, unspecified, without bleeding: Secondary | ICD-10-CM | POA: Diagnosis present

## 2021-01-08 DIAGNOSIS — Z79890 Hormone replacement therapy (postmenopausal): Secondary | ICD-10-CM

## 2021-01-08 DIAGNOSIS — K219 Gastro-esophageal reflux disease without esophagitis: Secondary | ICD-10-CM | POA: Diagnosis present

## 2021-01-08 DIAGNOSIS — Z880 Allergy status to penicillin: Secondary | ICD-10-CM

## 2021-01-08 DIAGNOSIS — D519 Vitamin B12 deficiency anemia, unspecified: Secondary | ICD-10-CM | POA: Diagnosis present

## 2021-01-08 DIAGNOSIS — I739 Peripheral vascular disease, unspecified: Secondary | ICD-10-CM | POA: Diagnosis present

## 2021-01-08 DIAGNOSIS — M25571 Pain in right ankle and joints of right foot: Secondary | ICD-10-CM | POA: Diagnosis present

## 2021-01-08 DIAGNOSIS — M79671 Pain in right foot: Secondary | ICD-10-CM

## 2021-01-08 DIAGNOSIS — Z7982 Long term (current) use of aspirin: Secondary | ICD-10-CM | POA: Diagnosis not present

## 2021-01-08 DIAGNOSIS — S72402A Unspecified fracture of lower end of left femur, initial encounter for closed fracture: Principal | ICD-10-CM | POA: Diagnosis present

## 2021-01-08 DIAGNOSIS — E876 Hypokalemia: Secondary | ICD-10-CM | POA: Diagnosis present

## 2021-01-08 DIAGNOSIS — D72829 Elevated white blood cell count, unspecified: Secondary | ICD-10-CM | POA: Diagnosis present

## 2021-01-08 DIAGNOSIS — S72401A Unspecified fracture of lower end of right femur, initial encounter for closed fracture: Principal | ICD-10-CM

## 2021-01-08 DIAGNOSIS — I10 Essential (primary) hypertension: Secondary | ICD-10-CM | POA: Diagnosis present

## 2021-01-08 DIAGNOSIS — Z96652 Presence of left artificial knee joint: Secondary | ICD-10-CM

## 2021-01-08 DIAGNOSIS — N179 Acute kidney failure, unspecified: Secondary | ICD-10-CM | POA: Diagnosis present

## 2021-01-08 DIAGNOSIS — Z20822 Contact with and (suspected) exposure to covid-19: Secondary | ICD-10-CM | POA: Diagnosis present

## 2021-01-08 DIAGNOSIS — D509 Iron deficiency anemia, unspecified: Secondary | ICD-10-CM | POA: Diagnosis present

## 2021-01-08 DIAGNOSIS — W010XXA Fall on same level from slipping, tripping and stumbling without subsequent striking against object, initial encounter: Secondary | ICD-10-CM | POA: Diagnosis present

## 2021-01-08 DIAGNOSIS — I959 Hypotension, unspecified: Secondary | ICD-10-CM | POA: Diagnosis not present

## 2021-01-08 DIAGNOSIS — Z79899 Other long term (current) drug therapy: Secondary | ICD-10-CM | POA: Diagnosis not present

## 2021-01-08 DIAGNOSIS — F419 Anxiety disorder, unspecified: Secondary | ICD-10-CM | POA: Diagnosis present

## 2021-01-08 DIAGNOSIS — Z419 Encounter for procedure for purposes other than remedying health state, unspecified: Secondary | ICD-10-CM

## 2021-01-08 DIAGNOSIS — M9712XA Periprosthetic fracture around internal prosthetic left knee joint, initial encounter: Secondary | ICD-10-CM | POA: Diagnosis present

## 2021-01-08 DIAGNOSIS — M978XXA Periprosthetic fracture around other internal prosthetic joint, initial encounter: Secondary | ICD-10-CM | POA: Diagnosis not present

## 2021-01-08 DIAGNOSIS — S72452A Displaced supracondylar fracture without intracondylar extension of lower end of left femur, initial encounter for closed fracture: Secondary | ICD-10-CM

## 2021-01-08 DIAGNOSIS — R7989 Other specified abnormal findings of blood chemistry: Secondary | ICD-10-CM | POA: Diagnosis not present

## 2021-01-08 DIAGNOSIS — J449 Chronic obstructive pulmonary disease, unspecified: Secondary | ICD-10-CM | POA: Diagnosis present

## 2021-01-08 DIAGNOSIS — E039 Hypothyroidism, unspecified: Secondary | ICD-10-CM | POA: Diagnosis present

## 2021-01-08 DIAGNOSIS — D62 Acute posthemorrhagic anemia: Secondary | ICD-10-CM | POA: Diagnosis not present

## 2021-01-08 DIAGNOSIS — M1990 Unspecified osteoarthritis, unspecified site: Secondary | ICD-10-CM | POA: Diagnosis present

## 2021-01-08 DIAGNOSIS — F439 Reaction to severe stress, unspecified: Secondary | ICD-10-CM | POA: Diagnosis present

## 2021-01-08 DIAGNOSIS — Z96659 Presence of unspecified artificial knee joint: Secondary | ICD-10-CM | POA: Diagnosis not present

## 2021-01-08 DIAGNOSIS — E871 Hypo-osmolality and hyponatremia: Secondary | ICD-10-CM | POA: Diagnosis present

## 2021-01-08 DIAGNOSIS — R059 Cough, unspecified: Secondary | ICD-10-CM

## 2021-01-08 LAB — CBC WITH DIFFERENTIAL/PLATELET
Abs Immature Granulocytes: 0.03 10*3/uL (ref 0.00–0.07)
Basophils Absolute: 0 10*3/uL (ref 0.0–0.1)
Basophils Relative: 0 %
Eosinophils Absolute: 0 10*3/uL (ref 0.0–0.5)
Eosinophils Relative: 0 %
HCT: 29.4 % — ABNORMAL LOW (ref 36.0–46.0)
Hemoglobin: 9.7 g/dL — ABNORMAL LOW (ref 12.0–15.0)
Immature Granulocytes: 0 %
Lymphocytes Relative: 5 %
Lymphs Abs: 0.5 10*3/uL — ABNORMAL LOW (ref 0.7–4.0)
MCH: 29.1 pg (ref 26.0–34.0)
MCHC: 33 g/dL (ref 30.0–36.0)
MCV: 88.3 fL (ref 80.0–100.0)
Monocytes Absolute: 0.7 10*3/uL (ref 0.1–1.0)
Monocytes Relative: 7 %
Neutro Abs: 9 10*3/uL — ABNORMAL HIGH (ref 1.7–7.7)
Neutrophils Relative %: 88 %
Platelets: 244 10*3/uL (ref 150–400)
RBC: 3.33 MIL/uL — ABNORMAL LOW (ref 3.87–5.11)
RDW: 13.7 % (ref 11.5–15.5)
WBC: 10.3 10*3/uL (ref 4.0–10.5)
nRBC: 0 % (ref 0.0–0.2)

## 2021-01-08 LAB — CBC
HCT: 31.4 % — ABNORMAL LOW (ref 36.0–46.0)
Hemoglobin: 10.3 g/dL — ABNORMAL LOW (ref 12.0–15.0)
MCH: 29.5 pg (ref 26.0–34.0)
MCHC: 32.8 g/dL (ref 30.0–36.0)
MCV: 90 fL (ref 80.0–100.0)
Platelets: 266 10*3/uL (ref 150–400)
RBC: 3.49 MIL/uL — ABNORMAL LOW (ref 3.87–5.11)
RDW: 13.7 % (ref 11.5–15.5)
WBC: 13.3 10*3/uL — ABNORMAL HIGH (ref 4.0–10.5)
nRBC: 0 % (ref 0.0–0.2)

## 2021-01-08 LAB — COMPREHENSIVE METABOLIC PANEL
ALT: 13 U/L (ref 0–44)
AST: 22 U/L (ref 15–41)
Albumin: 3.5 g/dL (ref 3.5–5.0)
Alkaline Phosphatase: 35 U/L — ABNORMAL LOW (ref 38–126)
Anion gap: 12 (ref 5–15)
BUN: 17 mg/dL (ref 8–23)
CO2: 26 mmol/L (ref 22–32)
Calcium: 9.3 mg/dL (ref 8.9–10.3)
Chloride: 95 mmol/L — ABNORMAL LOW (ref 98–111)
Creatinine, Ser: 1.01 mg/dL — ABNORMAL HIGH (ref 0.44–1.00)
GFR, Estimated: 53 mL/min — ABNORMAL LOW (ref >60)
Glucose, Bld: 133 mg/dL — ABNORMAL HIGH (ref 70–99)
Potassium: 4 mmol/L (ref 3.5–5.1)
Sodium: 133 mmol/L — ABNORMAL LOW (ref 135–145)
Total Bilirubin: 0.8 mg/dL (ref 0.3–1.2)
Total Protein: 6.7 g/dL (ref 6.5–8.1)

## 2021-01-08 LAB — BASIC METABOLIC PANEL
Anion gap: 10 (ref 5–15)
BUN: 18 mg/dL (ref 8–23)
CO2: 26 mmol/L (ref 22–32)
Calcium: 9 mg/dL (ref 8.9–10.3)
Chloride: 94 mmol/L — ABNORMAL LOW (ref 98–111)
Creatinine, Ser: 1.2 mg/dL — ABNORMAL HIGH (ref 0.44–1.00)
GFR, Estimated: 43 mL/min — ABNORMAL LOW (ref >60)
Glucose, Bld: 148 mg/dL — ABNORMAL HIGH (ref 70–99)
Potassium: 3.7 mmol/L (ref 3.5–5.1)
Sodium: 130 mmol/L — ABNORMAL LOW (ref 135–145)

## 2021-01-08 LAB — MAGNESIUM: Magnesium: 2 mg/dL (ref 1.7–2.4)

## 2021-01-08 LAB — RESP PANEL BY RT-PCR (FLU A&B, COVID) ARPGX2
Influenza A by PCR: NEGATIVE
Influenza B by PCR: NEGATIVE
SARS Coronavirus 2 by RT PCR: NEGATIVE

## 2021-01-08 LAB — TSH: TSH: 0.259 u[IU]/mL — ABNORMAL LOW (ref 0.350–4.500)

## 2021-01-08 MED ORDER — OXYCODONE HCL 5 MG PO TABS
5.0000 mg | ORAL_TABLET | ORAL | Status: DC | PRN
  Filled 2021-01-08: qty 1

## 2021-01-08 MED ORDER — ACETAMINOPHEN 325 MG PO TABS
650.0000 mg | ORAL_TABLET | Freq: Four times a day (QID) | ORAL | Status: DC | PRN
  Administered 2021-01-09 – 2021-01-17 (×3): 650 mg via ORAL
  Filled 2021-01-08 (×3): qty 2

## 2021-01-08 MED ORDER — PANTOPRAZOLE SODIUM 40 MG PO TBEC
40.0000 mg | DELAYED_RELEASE_TABLET | Freq: Every day | ORAL | Status: DC
  Administered 2021-01-08 – 2021-01-17 (×8): 40 mg via ORAL
  Filled 2021-01-08 (×8): qty 1

## 2021-01-08 MED ORDER — ASPIRIN 81 MG PO CHEW
81.0000 mg | CHEWABLE_TABLET | Freq: Every day | ORAL | Status: DC
  Administered 2021-01-08 – 2021-01-09 (×2): 81 mg via ORAL
  Filled 2021-01-08 (×2): qty 1

## 2021-01-08 MED ORDER — ONDANSETRON HCL 4 MG/2ML IJ SOLN
4.0000 mg | Freq: Four times a day (QID) | INTRAMUSCULAR | Status: DC | PRN
  Administered 2021-01-08: 4 mg via INTRAVENOUS
  Filled 2021-01-08: qty 2

## 2021-01-08 MED ORDER — HYDROCODONE-ACETAMINOPHEN 5-325 MG PO TABS: 10.0000 | ORAL_TABLET | Freq: Four times a day (QID) | ORAL | Status: DC | PRN

## 2021-01-08 MED ORDER — ONDANSETRON HCL 4 MG PO TABS: 4.0000 mg | ORAL_TABLET | Freq: Four times a day (QID) | ORAL | Status: DC | PRN

## 2021-01-08 MED ORDER — ACETAMINOPHEN 650 MG RE SUPP: 650.0000 mg | Freq: Four times a day (QID) | RECTAL | Status: DC | PRN

## 2021-01-08 MED ORDER — LORAZEPAM 0.5 MG PO TABS
0.5000 mg | ORAL_TABLET | Freq: Two times a day (BID) | ORAL | Status: DC
  Administered 2021-01-08 – 2021-01-10 (×6): 0.5 mg via ORAL
  Filled 2021-01-08 (×6): qty 1

## 2021-01-08 MED ORDER — CILOSTAZOL 100 MG PO TABS
100.0000 mg | ORAL_TABLET | Freq: Two times a day (BID) | ORAL | Status: DC
  Administered 2021-01-08 – 2021-01-09 (×4): 100 mg via ORAL
  Filled 2021-01-08 (×6): qty 1

## 2021-01-08 MED ORDER — HEPARIN SODIUM (PORCINE) 5000 UNIT/ML IJ SOLN
5000.0000 [IU] | Freq: Three times a day (TID) | INTRAMUSCULAR | Status: DC
  Administered 2021-01-08 – 2021-01-10 (×5): 5000 [IU] via SUBCUTANEOUS
  Filled 2021-01-08 (×5): qty 1

## 2021-01-08 MED ORDER — FUROSEMIDE 40 MG PO TABS: 40.0000 mg | ORAL_TABLET | Freq: Every day | ORAL | Status: DC

## 2021-01-08 MED ORDER — MORPHINE SULFATE (PF) 2 MG/ML IV SOLN: 2.0000 mg | INTRAVENOUS | Status: DC | PRN

## 2021-01-08 MED ORDER — FELODIPINE ER 5 MG PO TB24
10.0000 mg | ORAL_TABLET | Freq: Every day | ORAL | Status: DC
  Administered 2021-01-08 – 2021-01-09 (×2): 10 mg via ORAL
  Filled 2021-01-08: qty 2
  Filled 2021-01-08 (×3): qty 1

## 2021-01-08 MED ORDER — LEVOTHYROXINE SODIUM 100 MCG PO TABS
100.0000 ug | ORAL_TABLET | Freq: Every day | ORAL | Status: DC
  Administered 2021-01-08 – 2021-01-09 (×2): 100 ug via ORAL
  Filled 2021-01-08: qty 1
  Filled 2021-01-08: qty 2

## 2021-01-08 MED ORDER — METOPROLOL TARTRATE 50 MG PO TABS
100.0000 mg | ORAL_TABLET | Freq: Two times a day (BID) | ORAL | Status: DC
  Administered 2021-01-08 – 2021-01-14 (×13): 100 mg via ORAL
  Filled 2021-01-08 (×13): qty 2

## 2021-01-08 NOTE — ED Notes (Signed)
Patient denies pain and is resting comfortably.  

## 2021-01-08 NOTE — Progress Notes (Signed)
Pt arrived to unit. Dr. Alvan Dame assistant was made aware. Pt is resting comfortably in room, denies any pain. Vitals stable. Will continue to monitor.

## 2021-01-08 NOTE — ED Triage Notes (Signed)
Pt tripped and fell on her left knee, and twisted her right ankle. Both are swollen and painful with movement. + pedal pulses   Hx of bilateral knee replacement.

## 2021-01-08 NOTE — Progress Notes (Signed)
Patient ID: [name], female   DOB: 31-Mar-1929, 87 y.o.   MRN: [id number] Consult received for right distal periprosthetic femur fracture  Please contact [place] when patient arrives at [facility] for full consult note and surgical plan

## 2021-01-08 NOTE — ED Provider Notes (Signed)
[facility] Emergency Department Provider Note MRN:  [id number]  Arrival date & time: 01/08/21     Chief Complaint   Fall   History of Present Illness   [name] is a 87 y.o. year-old female with a history of hypertension presenting to the ED with chief complaint of fall.  Patient lost her balance and fell today.  Endorsing pain to the left knee.  Denies head trauma, no loss of consciousness, no neck or back pain, no chest pain or shortness of breath, no abdominal pain.  Mild pain to the right ankle as well.  Pain constant, worse with motion.  Review of Systems  A complete 10 system review of systems was obtained and all systems are negative except as noted in the HPI and PMH.   Patient's Health History    Past Medical History:  Diagnosis Date  . Arthritis   . Gastritis, Helicobacter pylori    Treated with Pylera, 2010  . Hypertension   . Hypothyroidism     Past Surgical History:  Procedure Laterality Date  . ABDOMINAL HYSTERECTOMY    . CARPAL TUNNEL RELEASE    . CERVICAL FUSION    . COLON SURGERY  2010   Dr. Georgette Dover: secondary to cecal mass. Negative for malignancy  . COLONOSCOPY  May 2010   Dr. Oneida Alar: large cecal mass, referred to Dr. Georgette Dover  . ESOPHAGOGASTRODUODENOSCOPY  May 2010   normal esophagus, no Barrett's, moderate gastritis, +H.PYLORI  . JOINT REPLACEMENT     bilateral knee replacement, left shoulder surgery    Family History  Problem Relation Age of Onset  . Colon cancer Neg Hx     Social History   Socioeconomic History  . Marital status: Widowed    Spouse name: Not on file  . Number of children: Not on file  . Years of education: Not on file  . Highest education level: Not on file  Occupational History  . Not on file  Tobacco Use  . Smoking status: Never Smoker  . Smokeless tobacco: Not on file  Substance and Sexual Activity  . Alcohol use: No  . Drug use: No  . Sexual activity: Never  Other Topics Concern  .  Not on file  Social History Narrative   Widowed, 2 daughters    Lives in [place]   Denies EtOH, smoking    Social Determinants of Health   Financial Resource Strain: Not on file  Food Insecurity: Not on file  Transportation Needs: Not on file  Physical Activity: Not on file  Stress: Not on file  Social Connections: Not on file  Intimate Partner Violence: Not on file     Physical Exam   Vitals:   01/08/21 0530 01/08/21 0600  BP: 117/68 138/74  Pulse:  (!) 53  Resp: 18 (!) 25  Temp:    SpO2:  94%    CONSTITUTIONAL: Well-appearing, NAD NEURO:  Alert and oriented x 3, no focal deficits EYES:  eyes equal and reactive ENT/NECK:  no LAD, no JVD CARDIO: Regular rate, well-perfused, normal S1 and S2 PULM:  CTAB no wheezing or rhonchi GI/GU:  normal bowel sounds, non-distended, non-tender MSK/SPINE: Swelling and bruising noted to the left knee, neurovascularly intact distally SKIN:  no rash, atraumatic PSYCH:  Appropriate speech and behavior  *Additional and/or pertinent findings included in MDM below  Diagnostic and Interventional Summary    EKG Interpretation  Date/Time:  Monday January 08 2021 00:45:19 EST Ventricular Rate:  89 PR Interval:    QRS Duration: 116 QT Interval:  347 QTC Calculation: 411 R Axis:   20 Text Interpretation: Sinus rhythm Atrial premature complex Left atrial enlargement Nonspecific intraventricular conduction delay Anterior infarct, old When compared with ECG of 05/11/2013, Premature atrial complexes are now present Confirmed by Delora Fuel ([id number]) on 01/08/2021 6:31:05 AM      Labs Reviewed  CBC - Abnormal; Notable for the following components:      Result Value   WBC 13.3 (*)    RBC 3.49 (*)    Hemoglobin 10.3 (*)    HCT 31.4 (*)    All other components within normal limits  BASIC METABOLIC PANEL - Abnormal; Notable for the following components:   Sodium 130 (*)    Chloride 94 (*)    Glucose, Bld 148 (*)     Creatinine, Ser 1.20 (*)    GFR, Estimated 43 (*)    All other components within normal limits  RESP PANEL BY RT-PCR (FLU A&B, COVID) ARPGX2  COMPREHENSIVE METABOLIC PANEL  MAGNESIUM  CBC WITH DIFFERENTIAL/PLATELET  TSH    DG Knee Complete 4 Views Left  Final Result    DG Ankle Complete Right  Final Result      Medications  aspirin chewable tablet 81 mg (has no administration in time range)  HYDROcodone-acetaminophen (NORCO/VICODIN) 5-325 MG per tablet 10 tablet (has no administration in time range)  felodipine (PLENDIL) 24 hr tablet 10 mg (has no administration in time range)  metoprolol tartrate (LOPRESSOR) tablet 100 mg (100 mg Oral Not Given 01/08/21 0310)  LORazepam (ATIVAN) tablet 0.5 mg (0.5 mg Oral Not Given 01/08/21 0310)  levothyroxine (SYNTHROID) tablet 100 mcg (has no administration in time range)  pantoprazole (PROTONIX) EC tablet 40 mg (has no administration in time range)  cilostazol (PLETAL) tablet 100 mg (100 mg Oral Not Given 01/08/21 0309)  heparin injection 5,000 Units (has no administration in time range)  acetaminophen (TYLENOL) tablet 650 mg (has no administration in time range)    Or  acetaminophen (TYLENOL) suppository 650 mg (has no administration in time range)  oxyCODONE (Oxy IR/ROXICODONE) immediate release tablet 5 mg (has no administration in time range)  morphine 2 MG/ML injection 2 mg (has no administration in time range)  ondansetron (ZOFRAN) tablet 4 mg (has no administration in time range)    Or  ondansetron (ZOFRAN) injection 4 mg (has no administration in time range)     Procedures  /  Critical Care Procedures  ED Course and Medical Decision Making  I have reviewed the triage vital signs, the nursing notes, and pertinent available records from the EMR.  Listed above are laboratory and imaging tests that I personally ordered, reviewed, and interpreted and then considered in my medical decision making (see below for  details).  Periprosthetic distal femur fracture, will discuss management with orthopedics.     Dr. Alvan Dame of orthopedics recommending hospitalist admission for inpatient repair.  Will admit to [facility] hospitalist team.  Barth Kirks. Sedonia Small, [place] [email or web address]  Final Clinical Impressions(s) / ED Diagnoses     ICD-10-CM   1. Closed fracture of distal end of right femur, unspecified fracture morphology, initial encounter [facility])  S72.401A     ED Discharge Orders    None       Discharge Instructions Discussed with and Provided to Patient:   Discharge Instructions   None       Shaquille Janes, Barth Kirks, MD  01/08/21 0646  

## 2021-01-08 NOTE — Progress Notes (Signed)
Arrived from [facility]. Seen at bedside, looks comfortable. Will let ortho know she is here  Costin M. Cruzita Lederer, MD, PhD [facility]  Between 7 am - 7 pm you can contact me via [place] or [place].  I am not available 7 pm - 7 am, please contact night coverage MD/APP via Amion

## 2021-01-08 NOTE — Progress Notes (Signed)
ASSUMPTION OF CARE NOTE   01/08/2021 11:00 AM  [name] was seen and examined.  The H&P by the admitting provider, orders, imaging was reviewed.  Pt was accepted by Dr. Alvan Dame for orthopedic consult and requested patient transfer to [facility].  Please notify Dr. Alvan Dame when patient arrives to [facility].  Pt is currently resting comfortably in ED exam room and her daughter was at bedside and updated on transfer and plan of care.  She verbalized understanding.    Vitals:   01/08/21 1000 01/08/21 1030  BP: (!) 166/86 120/76  Pulse:    Resp: (!) 21 14  Temp:    SpO2:      Results for orders placed or performed during the hospital encounter of 01/08/21  Resp Panel by RT-PCR (Flu A&B, Covid) Nasopharyngeal Swab   Specimen: Nasopharyngeal Swab; Nasopharyngeal(NP) swabs in vial transport medium  Result Value Ref Range   SARS Coronavirus 2 by RT PCR NEGATIVE NEGATIVE   Influenza A by PCR NEGATIVE NEGATIVE   Influenza B by PCR NEGATIVE NEGATIVE  CBC  Result Value Ref Range   WBC 13.3 (H) 4.0 - 10.5 K/uL   RBC 3.49 (L) 3.87 - 5.11 MIL/uL   Hemoglobin 10.3 (L) 12.0 - 15.0 g/dL   HCT 31.4 (L) 36.0 - 46.0 %   MCV 90.0 80.0 - 100.0 fL   MCH 29.5 26.0 - 34.0 pg   MCHC 32.8 30.0 - 36.0 g/dL   RDW 13.7 11.5 - 15.5 %   Platelets 266 150 - 400 K/uL   nRBC 0.0 0.0 - 0.2 %  Basic metabolic panel  Result Value Ref Range   Sodium 130 (L) 135 - 145 mmol/L   Potassium 3.7 3.5 - 5.1 mmol/L   Chloride 94 (L) 98 - 111 mmol/L   CO2 26 22 - 32 mmol/L   Glucose, Bld 148 (H) 70 - 99 mg/dL   BUN 18 8 - 23 mg/dL   Creatinine, Ser 1.20 (H) 0.44 - 1.00 mg/dL   Calcium 9.0 8.9 - 10.3 mg/dL   GFR, Estimated 43 (L) >60 mL/min   Anion gap 10 5 - 15  Comprehensive metabolic panel  Result Value Ref Range   Sodium 133 (L) 135 - 145 mmol/L   Potassium 4.0 3.5 - 5.1 mmol/L   Chloride 95 (L) 98 - 111 mmol/L   CO2 26 22 - 32 mmol/L   Glucose, Bld 133 (H) 70 - 99 mg/dL   BUN 17 8 - 23 mg/dL   Creatinine, Ser 1.01 (H) 0.44  - 1.00 mg/dL   Calcium 9.3 8.9 - 10.3 mg/dL   Total Protein 6.7 6.5 - 8.1 g/dL   Albumin 3.5 3.5 - 5.0 g/dL   AST 22 15 - 41 U/L   ALT 13 0 - 44 U/L   Alkaline Phosphatase 35 (L) 38 - 126 U/L   Total Bilirubin 0.8 0.3 - 1.2 mg/dL   GFR, Estimated 53 (L) >60 mL/min   Anion gap 12 5 - 15  Magnesium  Result Value Ref Range   Magnesium 2.0 1.7 - 2.4 mg/dL  CBC WITH DIFFERENTIAL  Result Value Ref Range   WBC 10.3 4.0 - 10.5 K/uL   RBC 3.33 (L) 3.87 - 5.11 MIL/uL   Hemoglobin 9.7 (L) 12.0 - 15.0 g/dL   HCT 29.4 (L) 36.0 - 46.0 %   MCV 88.3 80.0 - 100.0 fL   MCH 29.1 26.0 - 34.0 pg   MCHC 33.0 30.0 - 36.0 g/dL  RDW 13.7 11.5 - 15.5 %   Platelets 244 150 - 400 K/uL   nRBC 0.0 0.0 - 0.2 %   Neutrophils Relative % 88 %   Neutro Abs 9.0 (H) 1.7 - 7.7 K/uL   Lymphocytes Relative 5 %   Lymphs Abs 0.5 (L) 0.7 - 4.0 K/uL   Monocytes Relative 7 %   Monocytes Absolute 0.7 0.1 - 1.0 K/uL   Eosinophils Relative 0 %   Eosinophils Absolute 0.0 0.0 - 0.5 K/uL   Basophils Relative 0 %   Basophils Absolute 0.0 0.0 - 0.1 K/uL   Immature Granulocytes 0 %   Abs Immature Granulocytes 0.03 0.00 - 0.07 K/uL  TSH  Result Value Ref Range   TSH 0.259 (L) 0.350 - 4.500 uIU/mL   C. Wynetta Emery, MD [facility]   01/08/2021 12:40 AM How to contact the [facility] Attending or Consulting provider [place] or covering provider during after hours [place], for this patient?  1. Check the care team in [facility] and look for a) attending/consulting [facility] provider listed and b) the [facility] team listed 2. Log into [email or web address] and use [place]'s universal password to access. If you do not have the password, please contact the hospital operator. 3. Locate the [facility] provider you are looking for under [facility] and page to a number that you can be directly reached. 4. If you still have difficulty reaching the provider, please page the [facility] (Director on Call) for the Hospitalists listed on amion for assistance.

## 2021-01-08 NOTE — H&P (Signed)
TRH H&P    Patient Demographics:    [name], is a 87 y.o. female  MRN: [id number]  DOB - 25-Jan-1929  Admit Date - 01/08/2021  Referring MD/NP/PA: Sedonia Small  Outpatient Primary MD for the patient is Rosita Fire, MD  Patient coming from: Home  Chief complaint-leg pain   HPI:    [name]  is a 87 y.o. female, with history of anxiety, hypothyroidism, PVD, presents to the ED with a chief complaint of leg pain.  Patient reports that she got up from sleep to go to the bathroom with the assistance of her cane and her daughter, she reached out to grab a hold of the back of her recliner, missed, and fell down when she lost her balance.  She reports no chest pain, no palpitations, no shortness of breath, no dizziness prior to fall.  She reports that she has been eating and drinking as normal.  Daughter was with her and reports that she did not hit her head, she did not lose consciousness.  Daughter reports that she was able to catch patient on the right side, so she took more of a hit on her left.  Daughter reports the patient is otherwise been in her normal state of health.  She did have a cold but that was several months ago, that was last time she was anything other than her baseline health.  Patient does not smoke, does not drink.  She is not vaccinated for Covid and does not wish to be.  CODE STATUS was discussed and patient and her daughter agree that they would like her to remain full code.  In the ED Temp 98.1, heart rate 87, respiratory rate 19, blood pressure 136/64, satting 98% Leukocytosis at 13.3-acute phase reactant, hemoglobin 10.3 Chemistry panel reveals a creatinine of 1.20, last creatinine was 0.68 but that was 7 years ago. Hyponatremia 130 -this is patient's baseline and has been for 8 years Right ankle x-ray showed diffuse soft tissue swelling without any acute osseous abnormality Left knee x-ray  shows knee replacement with an acute impacted fracture of distal left femur extending to the level of the distal femoral prosthesis EKG showed sinus rhythm 89, QTc 411 ED provider spoke with Ortho who recommended admission to the hospital service at [facility]    Review of systems:    In addition to the HPI above,  No Fever-chills, No Headache, No changes with Vision or hearing, No problems swallowing food or Liquids, No Chest pain, Cough or Shortness of Breath, No Abdominal pain, No Nausea or Vomiting, bowel movements are regular, No Blood in stool or Urine, No dysuria, No new skin rashes or bruises, No new weakness, tingling, numbness in any extremity, No recent weight gain or loss, No polyuria, polydypsia or polyphagia, No significant Mental Stressors.  All other systems reviewed and are negative.    Past History of the following :    Past Medical History:  Diagnosis Date  . Arthritis   . Gastritis, Helicobacter pylori    Treated with Pylera, 2010  .  Hypertension   . Hypothyroidism       Past Surgical History:  Procedure Laterality Date  . ABDOMINAL HYSTERECTOMY    . CARPAL TUNNEL RELEASE    . CERVICAL FUSION    . COLON SURGERY  2010   Dr. Georgette Dover: secondary to cecal mass. Negative for malignancy  . COLONOSCOPY  May 2010   Dr. Oneida Alar: large cecal mass, referred to Dr. Georgette Dover  . ESOPHAGOGASTRODUODENOSCOPY  May 2010   normal esophagus, no Barrett's, moderate gastritis, +H.PYLORI  . JOINT REPLACEMENT     bilateral knee replacement, left shoulder surgery      Social History:      Social History   Tobacco Use  . Smoking status: Never Smoker  . Smokeless tobacco: Not on file  Substance Use Topics  . Alcohol use: No       Family History :     Family History  Problem Relation Age of Onset  . Colon cancer Neg Hx       Home Medications:   Prior to Admission medications   Medication Sig Start Date End Date Taking? Authorizing Provider  aspirin 81 MG  chewable tablet Chew 81 mg by mouth daily.    [provider]  celecoxib (CELEBREX) 100 MG capsule Take 100 mg by mouth daily.    [provider]  cholecalciferol (VITAMIN D) 400 UNITS TABS Take 400 Units by mouth 2 (two) times daily.    [provider]  cilostazol (PLETAL) 100 MG tablet Take 100 mg by mouth 2 (two) times daily.    [provider]  felodipine (PLENDIL) 10 MG 24 hr tablet Take 10 mg by mouth daily.    [provider]  furosemide (LASIX) 40 MG tablet Take 1 tablet (40 mg total) by mouth daily. 05/12/13   McLean-Scocuzza, Nino Glow, MD  HYDROcodone-acetaminophen (NORCO/VICODIN) 5-325 MG per tablet Take 10 tablets by mouth every 6 (six) hours as needed for severe pain. 04/29/14   Varney Biles, MD  ibuprofen (ADVIL,MOTRIN) 400 MG tablet Take 1 tablet (400 mg total) by mouth every 6 (six) hours as needed. 04/29/14   Varney Biles, MD  lansoprazole (PREVACID) 30 MG capsule Take 30 mg by mouth daily.    [provider]  levothyroxine (SYNTHROID, LEVOTHROID) 100 MCG tablet Take 100 mcg by mouth daily before breakfast.    [provider]  LORazepam (ATIVAN) 0.5 MG tablet Take 1 tablet (0.5 mg total) by mouth 2 (two) times daily. 05/12/13   McLean-Scocuzza, Nino Glow, MD  metoprolol (LOPRESSOR) 100 MG tablet Take 100 mg by mouth 2 (two) times daily.    [provider]  metroNIDAZOLE (FLAGYL) 500 MG tablet Take 1 tablet (500 mg total) by mouth every 8 (eight) hours. 05/27/13   Rosita Fire, MD  potassium chloride SA (K-DUR,KLOR-CON) 20 MEQ tablet Take 1 tablet (20 mEq total) by mouth 2 (two) times daily. 05/27/13   Rosita Fire, MD  saccharomyces boulardii (FLORASTOR) 250 MG capsule Take 1 capsule (250 mg total) by mouth 2 (two) times daily. 05/27/13   Rosita Fire, MD     Allergies:     Allergies  Allergen Reactions  . Amoxicillin Other (See Comments)    Burning sensation.  Marland Kitchen Penicillins Other (See Comments)     Burning sensation.     Physical Exam:   Vitals  Blood pressure 136/64, pulse 87, temperature 98.1 F (36.7 C), resp. rate 19, height 5\' 4"  (1.626 m), weight 61.7 kg, SpO2 98 %.  1.  General: Patient lying supine in bed, no acute distress  2. Psychiatric: Very chatty, forgetful, mood and behavior normal for situation, pleasant cooperative with exam  3. Neurologic: Face is symmetric, speech and language are normal, sensation is normal in the upper and lower extremities bilaterally, alert and oriented to self and place, but not to year, no acute deficit on limited exam  4. HEENMT:  Head is atraumatic, normocephalic, pupils reactive to light, neck is supple, trachea is midline, mucous membranes are moist  5. Respiratory : Lungs are clear to auscultation bilaterally, without wheezes, rhonchi, rales, no cyanosis  6. Cardiovascular : Heart rate is normal, rhythm is regular, systolic murmur present, no rubs or gallops, no peripheral pitting edema  7. Gastrointestinal:  Abdomen is soft, nondistended, nontender to palpation  8. Skin:  Skin is warm dry and intact without acute lesions on limited exam  9.Musculoskeletal:  Left knee effusion, tenderness to palpation, right ankle edema as well    Data Review:    CBC Recent Labs  Lab 01/08/21 0228  WBC 13.3*  HGB 10.3*  HCT 31.4*  PLT 266  MCV 90.0  MCH 29.5  MCHC 32.8  RDW 13.7   ------------------------------------------------------------------------------------------------------------------  Results for orders placed or performed during the hospital encounter of 01/08/21 (from the past 48 hour(s))  Resp Panel by RT-PCR (Flu A&B, Covid) Nasopharyngeal Swab     Status: None   Collection Time: 01/08/21  2:27 AM   Specimen: Nasopharyngeal Swab; Nasopharyngeal(NP) swabs in vial transport medium  Result Value Ref Range   SARS Coronavirus 2 by RT PCR NEGATIVE NEGATIVE    Comment: (NOTE) SARS-CoV-2 target nucleic acids are  NOT DETECTED.  The SARS-CoV-2 RNA is generally detectable in upper respiratory specimens during the acute phase of infection. The lowest concentration of SARS-CoV-2 viral copies this assay can detect is 138 copies/mL. A negative result does not preclude SARS-Cov-2 infection and should not be used as the sole basis for treatment or other patient management decisions. A negative result may occur with  improper specimen collection/handling, submission of specimen other than nasopharyngeal swab, presence of viral mutation(s) within the areas targeted by this assay, and inadequate number of viral copies(<138 copies/mL). A negative result must be combined with clinical observations, patient history, and epidemiological information. The expected result is Negative.  Fact Sheet for Patients:  EntrepreneurPulse.com.au  Fact Sheet for Healthcare Providers:  IncredibleEmployment.be  This test is no t yet approved or cleared by the [place] FDA and  has been authorized for detection and/or diagnosis of SARS-CoV-2 by FDA under an Emergency Use Authorization (EUA). This EUA will remain  in effect (meaning this test can be used) for the duration of the COVID-19 declaration under Section 564(b)(1) of the Act, 21 U.S.C.section 360bbb-3(b)(1), unless the authorization is terminated  or revoked sooner.       Influenza A by PCR NEGATIVE NEGATIVE   Influenza B by PCR NEGATIVE NEGATIVE    Comment: (NOTE) The Xpert Xpress SARS-CoV-2/FLU/RSV plus assay is intended as an aid in the diagnosis of influenza from Nasopharyngeal swab specimens and should not be used as a sole basis for treatment. Nasal washings and aspirates are unacceptable for Xpert Xpress SARS-CoV-2/FLU/RSV testing.  Fact Sheet for Patients: EntrepreneurPulse.com.au  Fact Sheet for Healthcare Providers: IncredibleEmployment.be  This test is not yet approved  or cleared by the [place] FDA and has been authorized for detection and/or diagnosis of SARS-CoV-2 by FDA under an Emergency Use Authorization (EUA). This EUA will remain  in effect (meaning this test can be used) for the duration of the COVID-19 declaration under Section 564(b)(1) of the Act, 21 U.S.C. section 360bbb-3(b)(1), unless the authorization is terminated or revoked.  Performed at [facility], [address] [place], [place] [address]   CBC     Status: Abnormal   Collection Time: 01/08/21  2:28 AM  Result Value Ref Range   WBC 13.3 (H) 4.0 - 10.5 K/uL   RBC 3.49 (L) 3.87 - 5.11 MIL/uL   Hemoglobin 10.3 (L) 12.0 - 15.0 g/dL   HCT 31.4 (L) 36.0 - 46.0 %   MCV 90.0 80.0 - 100.0 fL   MCH 29.5 26.0 - 34.0 pg   MCHC 32.8 30.0 - 36.0 g/dL   RDW 13.7 11.5 - 15.5 %   Platelets 266 150 - 400 K/uL   nRBC 0.0 0.0 - 0.2 %    Comment: Performed at [facility] [address], [place], [place] [address]  Basic metabolic panel     Status: Abnormal   Collection Time: 01/08/21  2:28 AM  Result Value Ref Range   Sodium 130 (L) 135 - 145 mmol/L   Potassium 3.7 3.5 - 5.1 mmol/L   Chloride 94 (L) 98 - 111 mmol/L   CO2 26 22 - 32 mmol/L   Glucose, Bld 148 (H) 70 - 99 mg/dL    Comment: Glucose reference range applies only to samples taken after fasting for at least 8 hours.   BUN 18 8 - 23 mg/dL   Creatinine, Ser 1.20 (H) 0.44 - 1.00 mg/dL   Calcium 9.0 8.9 - 10.3 mg/dL   GFR, Estimated 43 (L) >60 mL/min    Comment: (NOTE) Calculated using the CKD-EPI Creatinine Equation (2021)    Anion gap 10 5 - 15    Comment: Performed at [facility], [address] [place], [place] [address]    Chemistries  Recent Labs  Lab 01/08/21 0228  NA 130*  K 3.7  CL 94*  CO2 26  GLUCOSE 148*  BUN 18  CREATININE 1.20*  CALCIUM 9.0    ------------------------------------------------------------------------------------------------------------------  ------------------------------------------------------------------------------------------------------------------ GFR: Estimated Creatinine Clearance: 27.4 mL/min (A) (by C-G formula based on SCr of 1.2 mg/dL (H)). Liver Function Tests: No results for input(s): AST, ALT, ALKPHOS, BILITOT, PROT, ALBUMIN in the last 168 hours. No results for input(s): LIPASE, AMYLASE in the last 168 hours. No results for input(s): AMMONIA in the last 168 hours. Coagulation Profile: No results for input(s): INR, PROTIME in the last 168 hours. Cardiac Enzymes: No results for input(s): CKTOTAL, CKMB, CKMBINDEX, TROPONINI in the last 168 hours. BNP (last 3 results) No results for input(s): PROBNP in the last 8760 hours. HbA1C: No results for input(s): HGBA1C in the last 72 hours. CBG: No results for input(s): GLUCAP in the last 168 hours. Lipid Profile: No results for input(s): CHOL, HDL, LDLCALC, TRIG, CHOLHDL, LDLDIRECT in the last 72 hours. Thyroid Function Tests: No results for input(s): TSH, T4TOTAL, FREET4, T3FREE, THYROIDAB in the last 72 hours. Anemia Panel: No results for input(s): VITAMINB12, FOLATE, FERRITIN, TIBC, IRON, RETICCTPCT in the last 72 hours.  --------------------------------------------------------------------------------------------------------------- Urine analysis:    Component Value Date/Time   COLORURINE YELLOW 01/30/2009 1412   APPEARANCEUR CLEAR 01/30/2009 1412   LABSPEC 1.010 01/30/2009 1412   PHURINE 6.0 01/30/2009 1412   [place] 01/30/2009 1412   HGBUR NEGATIVE 01/30/2009 1412   BILIRUBINUR NEGATIVE 01/30/2009 1412   KETONESUR NEGATIVE 01/30/2009 1412   PROTEINUR NEGATIVE 01/30/2009 1412   UROBILINOGEN  0.2 01/30/2009 1412   NITRITE NEGATIVE 01/30/2009 1412   LEUKOCYTESUR SMALL (A) 01/30/2009 1412      Imaging Results:    DG Ankle  Complete Right  Result Date: 01/08/2021 CLINICAL DATA:  Status post fall. EXAM: RIGHT ANKLE - COMPLETE 3+ VIEW COMPARISON:  None. FINDINGS: There is no evidence of fracture, dislocation, or joint effusion. There is a moderate-sized plantar calcaneal spur. Degenerative changes are seen within the visualized portion of the mid right foot. Mild diffuse soft tissue swelling is seen. IMPRESSION: 1. Diffuse soft tissue swelling without an acute osseous abnormality. Electronically Signed   By: Virgina Norfolk M.D.   On: 01/08/2021 01:18   DG Knee Complete 4 Views Left  Result Date: 01/08/2021 CLINICAL DATA:  Status post fall. EXAM: LEFT KNEE - COMPLETE 4+ VIEW COMPARISON:  None. FINDINGS: A left knee replacement is seen. An acute, impacted fracture deformity of the distal left femur is also noted. This extends to the level of the distal femoral prosthesis. There is no evidence of dislocation. There is a moderate sized joint effusion. Moderate severity soft tissue swelling is seen adjacent to the previously noted fracture site. IMPRESSION: 1. Left knee replacement with an acute, impacted fracture of the distal left femur extending to the level of the distal femoral prosthesis. 2. Moderate sized joint effusion with moderate severity soft tissue swelling. Electronically Signed   By: Virgina Norfolk M.D.   On: 01/08/2021 01:16    My personal review of EKG: Rhythm NSR, Rate 89 /min, QTc 411 ,no Acute ST changes   Assessment & Plan:    Active Problems:   Periprosthetic fracture around internal prosthetic knee joint   1. Periprosthetic fracture 1. Status post mechanical fall landing on both knees 2. Imaging shows a periprosthetic fracture 3. ED spoke with Ortho who recommended admission to [facility] for repair 4. Pain scale 5. N.p.o. 6. Continue to monitor 2. Mechanical fall 1. Will need PT eval prior to discharge 3. Anxiety 1. Continue Ativan 4. Hypothyroidism 1. Check TSH and continue  Synthroid 5. Hyponatremia 1. Hold Lasix while patient is n.p.o.    DVT Prophylaxis-   heparin - SCDs   AM Labs Ordered, also please review Full Orders  Family Communication: Admission, patients condition and plan of care including tests being ordered have been discussed with the patient and daughter who indicate understanding and agree with the plan and Code Status.  Code Status:  Full  Admission status: Inpatient :The appropriate admission status for this patient is INPATIENT. Inpatient status is judged to be reasonable and necessary in order to provide the required intensity of service to ensure the patient's safety. The patient's presenting symptoms, physical exam findings, and initial radiographic and laboratory data in the context of their chronic comorbidities is felt to place them at high risk for further clinical deterioration. Furthermore, it is not anticipated that the patient will be medically stable for discharge from the hospital within 2 midnights of admission. The following factors support the admission status of inpatient.     The patient's presenting symptoms include leg pain The worrisome physical exam findings include left knee edema and tenderness The initial radiographic and laboratory data are worrisome because of periprosthetic fracture The chronic co-morbidities include Hypothyroidism, HTN, gastritis       * I certify that at the point of admission it is my clinical judgment that the patient will require inpatient hospital care spanning beyond 2 midnights from the point of admission due to high  intensity of service, high risk for further deterioration and high frequency of surveillance required.*  Time spent in minutes : [place]

## 2021-01-09 ENCOUNTER — Other Ambulatory Visit: Payer: Self-pay

## 2021-01-09 ENCOUNTER — Encounter (HOSPITAL_COMMUNITY): Payer: Self-pay | Admitting: Family Medicine

## 2021-01-09 ENCOUNTER — Inpatient Hospital Stay (HOSPITAL_COMMUNITY): Payer: Medicare Other

## 2021-01-09 DIAGNOSIS — E871 Hypo-osmolality and hyponatremia: Secondary | ICD-10-CM | POA: Diagnosis not present

## 2021-01-09 DIAGNOSIS — E039 Hypothyroidism, unspecified: Secondary | ICD-10-CM

## 2021-01-09 DIAGNOSIS — I10 Essential (primary) hypertension: Secondary | ICD-10-CM | POA: Diagnosis not present

## 2021-01-09 DIAGNOSIS — S72401A Unspecified fracture of lower end of right femur, initial encounter for closed fracture: Secondary | ICD-10-CM | POA: Diagnosis not present

## 2021-01-09 MED ORDER — LEVOTHYROXINE SODIUM 88 MCG PO TABS
88.0000 ug | ORAL_TABLET | Freq: Every day | ORAL | Status: DC
  Administered 2021-01-10 – 2021-01-17 (×4): 88 ug via ORAL
  Filled 2021-01-09 (×7): qty 1

## 2021-01-09 NOTE — Progress Notes (Signed)
PROGRESS NOTE  [name] GLO:[id number] DOB: [birth year] DOA: 01/08/2021 PCP: Rosita Fire, MD   LOS: 1 day   Brief Narrative / Interim history: 87 year old female with hypothyroidism, PVD, anxiety, comes into the hospital with leg pain.  She apparently got up from sleep to go to the bathroom with assistance from her daughter, reached out to grab a hold of the back of the recliner, next and fell down.  No reports of loss of consciousness, chest pain, palpitations.  Imaging in the ED showed acute impacted fracture of the distal left femur extending to the level of distal femoral prosthesis.  Patient was admitted to the hospital and orthopedic surgery was consulted  Subjective / 24h Interval events: No complaints this morning, no pain if she does move around.  No chest pain, no shortness of breath  Assessment & Plan: Principal Problem Periprosthetic fracture -status post mechanical fall.  Discussed with Dr. Alvan Dame, he will see patient in consultation, surgery may be a bit complex and will happen Wednesday or Thursday.  Further plans per Ortho  Active Problems Hypothyroidism-continue Synthroid.  TSH slightly suppressed, decrease the dose from 100 to 88 mcg. Recheck TSH in 3-4 weeks  Essential hypertension-continue metoprolol  PVD-continue aspirin, cilostazol  Anxiety-continue Xanax  Hyponatremia-Home Lasix is on hold, sodium improving this morning  Mild anemia of chronic disease-hemoglobin stable  Mild AKI-creatinine 1.2 on presentation, most recent one 0.68 but that was in 2014.  Creatinine 1.0 this morning, encourage p.o. intake.  I wonder whether this is her new baseline given length of time between prior creatinine values  Leukocytosis-likely reactive, resolved today  Scheduled Meds: . aspirin  81 mg Oral Daily  . cilostazol  100 mg Oral BID  . felodipine  10 mg Oral Daily  . heparin  5,000 Units Subcutaneous Q8H  . levothyroxine  100 mcg Oral QAC breakfast  . LORazepam   0.5 mg Oral BID  . metoprolol tartrate  100 mg Oral BID  . pantoprazole  40 mg Oral Daily   Continuous Infusions: PRN Meds:.acetaminophen **OR** acetaminophen, HYDROcodone-acetaminophen, morphine injection, ondansetron **OR** ondansetron (ZOFRAN) IV, oxyCODONE  Diet Orders (From admission, onward)    Start     Ordered   01/08/21 1640  DIET SOFT Room service appropriate? Yes; Fluid consistency: Thin  Diet effective now       Question Answer Comment  Room service appropriate? Yes   Fluid consistency: Thin      01/08/21 1639          DVT prophylaxis: heparin injection 5,000 Units Start: 01/08/21 0600 SCDs Start: 01/08/21 0310     Code Status: Full Code  Family Communication: no family at bedside   Status is: Inpatient  Remains inpatient appropriate because:Inpatient level of care appropriate due to severity of illness   Dispo: The patient is from: Home              Anticipated d/c is to: SNF              Anticipated d/c date is: 3 days              Patient currently is not medically stable to d/c.   Difficult to place patient No   Level of care: Med-Surg  Consultants:  Orthopedic surgery   Procedures:  none  Microbiology  none  Antimicrobials: none    Objective: Vitals:   01/08/21 1403 01/08/21 1941 01/08/21 2336 01/09/21 0428  BP: (!) 95/57 (!) 100/48 (!) 113/45 98/77  Pulse:  70 75 65 76  Resp: 14 15 17 18   Temp: 98.8 F (37.1 C) 98.6 F (37 C) 99.6 F (37.6 C) 98.7 F (37.1 C)  TempSrc: Oral Oral Axillary Axillary  SpO2: 97% 96% 90% 95%  Weight:   65.4 kg   Height:       No intake or output data in the 24 hours ending 01/09/21 0627 Filed Weights   01/08/21 0043 01/08/21 2336  Weight: 61.7 kg 65.4 kg    Examination:  Constitutional: NAD Eyes: no scleral icterus ENMT: Mucous membranes are moist.  Neck: normal, supple Respiratory: clear to auscultation bilaterally, no wheezing, no crackles. Normal respiratory effort. No accessory muscle  use.  Cardiovascular: Regular rate and rhythm, no murmurs / rubs / gallops. No LE edema.  Abdomen: non distended, no tenderness. Bowel sounds positive.  Musculoskeletal: no clubbing / cyanosis.  Skin: no rashes Neurologic: CN 2-12 grossly intact.  Equal strength upper extremities, lower extremities deferred due to fracture Psychiatric: Normal judgment and insight. Alert and oriented x 3. Normal mood.   Data Reviewed: I have independently reviewed following labs and imaging studies   CBC: Recent Labs  Lab 01/08/21 0228 01/08/21 0811  WBC 13.3* 10.3  NEUTROABS  --  9.0*  HGB 10.3* 9.7*  HCT 31.4* 29.4*  MCV 90.0 88.3  PLT 266 527   Basic Metabolic Panel: Recent Labs  Lab 01/08/21 0228 01/08/21 0811  NA 130* 133*  K 3.7 4.0  CL 94* 95*  CO2 26 26  GLUCOSE 148* 133*  BUN 18 17  CREATININE 1.20* 1.01*  CALCIUM 9.0 9.3  MG  --  2.0   Liver Function Tests: Recent Labs  Lab 01/08/21 0811  AST 22  ALT 13  ALKPHOS 35*  BILITOT 0.8  PROT 6.7  ALBUMIN 3.5   Coagulation Profile: No results for input(s): INR, PROTIME in the last 168 hours. HbA1C: No results for input(s): HGBA1C in the last 72 hours. CBG: No results for input(s): GLUCAP in the last 168 hours.  Recent Results (from the past 240 hour(s))  Resp Panel by RT-PCR (Flu A&B, Covid) Nasopharyngeal Swab     Status: None   Collection Time: 01/08/21  2:27 AM   Specimen: Nasopharyngeal Swab; Nasopharyngeal(NP) swabs in vial transport medium  Result Value Ref Range Status   SARS Coronavirus 2 by RT PCR NEGATIVE NEGATIVE Final    Comment: (NOTE) SARS-CoV-2 target nucleic acids are NOT DETECTED.  The SARS-CoV-2 RNA is generally detectable in upper respiratory specimens during the acute phase of infection. The lowest concentration of SARS-CoV-2 viral copies this assay can detect is 138 copies/mL. A negative result does not preclude SARS-Cov-2 infection and should not be used as the sole basis for treatment  or other patient management decisions. A negative result may occur with  improper specimen collection/handling, submission of specimen other than nasopharyngeal swab, presence of viral mutation(s) within the areas targeted by this assay, and inadequate number of viral copies(<138 copies/mL). A negative result must be combined with clinical observations, patient history, and epidemiological information. The expected result is Negative.  Fact Sheet for Patients:  EntrepreneurPulse.com.au  Fact Sheet for Healthcare Providers:  IncredibleEmployment.be  This test is no t yet approved or cleared by the [place] FDA and  has been authorized for detection and/or diagnosis of SARS-CoV-2 by FDA under an Emergency Use Authorization (EUA). This EUA will remain  in effect (meaning this test can be used) for the duration of the COVID-19 declaration under Section  564(b)(1) of the Act, 21 U.S.C.section 360bbb-3(b)(1), unless the authorization is terminated  or revoked sooner.       Influenza A by PCR NEGATIVE NEGATIVE Final   Influenza B by PCR NEGATIVE NEGATIVE Final    Comment: (NOTE) The Xpert Xpress SARS-CoV-2/FLU/RSV plus assay is intended as an aid in the diagnosis of influenza from Nasopharyngeal swab specimens and should not be used as a sole basis for treatment. Nasal washings and aspirates are unacceptable for Xpert Xpress SARS-CoV-2/FLU/RSV testing.  Fact Sheet for Patients: EntrepreneurPulse.com.au  Fact Sheet for Healthcare Providers: IncredibleEmployment.be  This test is not yet approved or cleared by the [place] FDA and has been authorized for detection and/or diagnosis of SARS-CoV-2 by FDA under an Emergency Use Authorization (EUA). This EUA will remain in effect (meaning this test can be used) for the duration of the COVID-19 declaration under Section 564(b)(1) of the Act, 21 U.S.C. section  360bbb-3(b)(1), unless the authorization is terminated or revoked.  Performed at [facility], [address] [place], [place] [address]      Radiology Studies: No results found.  Marzetta Board, MD, PhD [facility]  Between 7 am - 7 pm I am available, please contact me via Amion or Securechat  Between 7 pm - 7 am I am not available, please contact night coverage MD/APP via Amion

## 2021-01-09 NOTE — Plan of Care (Signed)

## 2021-01-09 NOTE — Plan of Care (Signed)

## 2021-01-09 NOTE — Consult Note (Signed)
Reason for Consult: Fracture of the distal left femur extending to the level of distal femoral prosthesis Referring Physician: ED Physician  [name] is an 87 y.o. female.  HPI: The patient is a 87 year old female with hypothyroidism, PVD, anxiety, comes into the hospital with leg pain.  She apparently got up from sleep to go to the bathroom with assistance from her daughter, reached out to grab a hold of the back of the recliner, next and fell down.  No reports of loss of consciousness, chest pain, palpitations.  Imaging in the ED showed acute impacted fracture of the distal left femur extending to the level of distal femoral prosthesis.  Patient was admitted to the hospital and orthopedic surgery was consulted.  Conversation was had with the patient regarding the need for surgery.  Discussed the distal femoral replacement.  Risks, benefits and expectations were discussed with the patient.  Risks including but not limited to the risk of anesthesia, blood clots, nerve damage, blood vessel damage, failure of the prosthesis, infection and up to and including death.  Patient understand the risks, benefits and expectations and wishes to proceed with surgery.  Past Medical History:  Diagnosis Date  . Arthritis   . Gastritis, Helicobacter pylori    Treated with Pylera, 2010  . Hypertension   . Hypothyroidism     Past Surgical History:  Procedure Laterality Date  . ABDOMINAL HYSTERECTOMY    . CARPAL TUNNEL RELEASE    . CERVICAL FUSION    . COLON SURGERY  2010   Dr. Georgette Dover: secondary to cecal mass. Negative for malignancy  . COLONOSCOPY  May 2010   Dr. Oneida Alar: large cecal mass, referred to Dr. Georgette Dover  . ESOPHAGOGASTRODUODENOSCOPY  May 2010   normal esophagus, no Barrett's, moderate gastritis, +H.PYLORI  . JOINT REPLACEMENT     bilateral knee replacement, left shoulder surgery    Family History  Problem Relation Age of Onset  . Colon cancer Neg Hx     Social History:  reports that she  has never smoked. She has never used smokeless tobacco. She reports that she does not drink alcohol and does not use drugs.  Allergies:  Allergies  Allergen Reactions  . Amoxicillin Other (See Comments)    Burning sensation.  Marland Kitchen Penicillins Other (See Comments)    Burning sensation.     Results for orders placed or performed during the hospital encounter of 01/08/21 (from the past 48 hour(s))  Resp Panel by RT-PCR (Flu A&B, Covid) Nasopharyngeal Swab     Status: None   Collection Time: 01/08/21  2:27 AM   Specimen: Nasopharyngeal Swab; Nasopharyngeal(NP) swabs in vial transport medium  Result Value Ref Range   SARS Coronavirus 2 by RT PCR NEGATIVE NEGATIVE    Comment: (NOTE) SARS-CoV-2 target nucleic acids are NOT DETECTED.  The SARS-CoV-2 RNA is generally detectable in upper respiratory specimens during the acute phase of infection. The lowest concentration of SARS-CoV-2 viral copies this assay can detect is 138 copies/mL. A negative result does not preclude SARS-Cov-2 infection and should not be used as the sole basis for treatment or other patient management decisions. A negative result may occur with  improper specimen collection/handling, submission of specimen other than nasopharyngeal swab, presence of viral mutation(s) within the areas targeted by this assay, and inadequate number of viral copies(<138 copies/mL). A negative result must be combined with clinical observations, patient history, and epidemiological information. The expected result is Negative.  Fact Sheet for Patients:  EntrepreneurPulse.com.au  Fact  Sheet for Healthcare Providers:  IncredibleEmployment.be  This test is no t yet approved or cleared by the [place] FDA and  has been authorized for detection and/or diagnosis of SARS-CoV-2 by FDA under an Emergency Use Authorization (EUA). This EUA will remain  in effect (meaning this test can be used) for the  duration of the COVID-19 declaration under Section 564(b)(1) of the Act, 21 U.S.C.section 360bbb-3(b)(1), unless the authorization is terminated  or revoked sooner.       Influenza A by PCR NEGATIVE NEGATIVE   Influenza B by PCR NEGATIVE NEGATIVE    Comment: (NOTE) The Xpert Xpress SARS-CoV-2/FLU/RSV plus assay is intended as an aid in the diagnosis of influenza from Nasopharyngeal swab specimens and should not be used as a sole basis for treatment. Nasal washings and aspirates are unacceptable for Xpert Xpress SARS-CoV-2/FLU/RSV testing.  Fact Sheet for Patients: EntrepreneurPulse.com.au  Fact Sheet for Healthcare Providers: IncredibleEmployment.be  This test is not yet approved or cleared by the [place] FDA and has been authorized for detection and/or diagnosis of SARS-CoV-2 by FDA under an Emergency Use Authorization (EUA). This EUA will remain in effect (meaning this test can be used) for the duration of the COVID-19 declaration under Section 564(b)(1) of the Act, 21 U.S.C. section 360bbb-3(b)(1), unless the authorization is terminated or revoked.  Performed at [facility], [address] [place], [place] [address]   CBC     Status: Abnormal   Collection Time: 01/08/21  2:28 AM  Result Value Ref Range   WBC 13.3 (H) 4.0 - 10.5 K/uL   RBC 3.49 (L) 3.87 - 5.11 MIL/uL   Hemoglobin 10.3 (L) 12.0 - 15.0 g/dL   HCT 31.4 (L) 36.0 - 46.0 %   MCV 90.0 80.0 - 100.0 fL   MCH 29.5 26.0 - 34.0 pg   MCHC 32.8 30.0 - 36.0 g/dL   RDW 13.7 11.5 - 15.5 %   Platelets 266 150 - 400 K/uL   nRBC 0.0 0.0 - 0.2 %    Comment: Performed at [facility], [address], [place], [place] [address]  Basic metabolic panel     Status: Abnormal   Collection Time: 01/08/21  2:28 AM  Result Value Ref Range   Sodium 130 (L) 135 - 145 mmol/L   Potassium 3.7 3.5 - 5.1 mmol/L   Chloride 94 (L) 98 - 111 mmol/L   CO2 26 22 - 32 mmol/L   Glucose, Bld 148 (H)  70 - 99 mg/dL    Comment: Glucose reference range applies only to samples taken after fasting for at least 8 hours.   BUN 18 8 - 23 mg/dL   Creatinine, Ser 1.20 (H) 0.44 - 1.00 mg/dL   Calcium 9.0 8.9 - 10.3 mg/dL   GFR, Estimated 43 (L) >60 mL/min    Comment: (NOTE) Calculated using the CKD-EPI Creatinine Equation (2021)    Anion gap 10 5 - 15    Comment: Performed at [facility], [address], [place],  [address]  Comprehensive metabolic panel     Status: Abnormal   Collection Time: 01/08/21  8:11 AM  Result Value Ref Range   Sodium 133 (L) 135 - 145 mmol/L   Potassium 4.0 3.5 - 5.1 mmol/L   Chloride 95 (L) 98 - 111 mmol/L   CO2 26 22 - 32 mmol/L   Glucose, Bld 133 (H) 70 - 99 mg/dL    Comment: Glucose reference range applies only to samples taken after fasting for at  least 8 hours.   BUN 17 8 - 23 mg/dL   Creatinine, Ser 1.01 (H) 0.44 - 1.00 mg/dL   Calcium 9.3 8.9 - 10.3 mg/dL   Total Protein 6.7 6.5 - 8.1 g/dL   Albumin 3.5 3.5 - 5.0 g/dL   AST 22 15 - 41 U/L   ALT 13 0 - 44 U/L   Alkaline Phosphatase 35 (L) 38 - 126 U/L   Total Bilirubin 0.8 0.3 - 1.2 mg/dL   GFR, Estimated 53 (L) >60 mL/min    Comment: (NOTE) Calculated using the CKD-EPI Creatinine Equation (2021)    Anion gap 12 5 - 15    Comment: Performed at [facility], [address], [place], [place] [address]  Magnesium     Status: None   Collection Time: 01/08/21  8:11 AM  Result Value Ref Range   Magnesium 2.0 1.7 - 2.4 mg/dL    Comment: Performed at [facility], [address] [place], [place] [address]  CBC WITH DIFFERENTIAL     Status: Abnormal   Collection Time: 01/08/21  8:11 AM  Result Value Ref Range   WBC 10.3 4.0 - 10.5 K/uL   RBC 3.33 (L) 3.87 - 5.11 MIL/uL   Hemoglobin 9.7 (L) 12.0 - 15.0 g/dL   HCT 29.4 (L) 36.0 - 46.0 %   MCV 88.3 80.0 - 100.0 fL   MCH 29.1 26.0 - 34.0 pg   MCHC 33.0 30.0 - 36.0 g/dL   RDW 13.7 11.5 - 15.5 %   Platelets 244 150 - 400 K/uL   nRBC 0.0 0.0 - 0.2  %   Neutrophils Relative % 88 %   Neutro Abs 9.0 (H) 1.7 - 7.7 K/uL   Lymphocytes Relative 5 %   Lymphs Abs 0.5 (L) 0.7 - 4.0 K/uL   Monocytes Relative 7 %   Monocytes Absolute 0.7 0.1 - 1.0 K/uL   Eosinophils Relative 0 %   Eosinophils Absolute 0.0 0.0 - 0.5 K/uL   Basophils Relative 0 %   Basophils Absolute 0.0 0.0 - 0.1 K/uL   Immature Granulocytes 0 %   Abs Immature Granulocytes 0.03 0.00 - 0.07 K/uL    Comment: Performed at [facility], [address] [place], [place] [address]  TSH     Status: Abnormal   Collection Time: 01/08/21  8:11 AM  Result Value Ref Range   TSH 0.259 (L) 0.350 - 4.500 uIU/mL    Comment: Performed by a 3rd Generation assay with a functional sensitivity of <=0.01 uIU/mL. Performed at [facility], [address] [place], [place] [address]     DG Ankle Complete Right  Result Date: 01/08/2021 CLINICAL DATA:  Status post fall. EXAM: RIGHT ANKLE - COMPLETE 3+ VIEW COMPARISON:  None. FINDINGS: There is no evidence of fracture, dislocation, or joint effusion. There is a moderate-sized plantar calcaneal spur. Degenerative changes are seen within the visualized portion of the mid right foot. Mild diffuse soft tissue swelling is seen. IMPRESSION: 1. Diffuse soft tissue swelling without an acute osseous abnormality. Electronically Signed   By: Virgina Norfolk M.D.   On: 01/08/2021 01:18   DG Knee Complete 4 Views Left  Result Date: 01/08/2021 CLINICAL DATA:  Status post fall. EXAM: LEFT KNEE - COMPLETE 4+ VIEW COMPARISON:  None. FINDINGS: A left knee replacement is seen. An acute, impacted fracture deformity of the distal left femur is also noted. This extends to the level of the distal femoral prosthesis. There is no evidence of dislocation. There is  a moderate sized joint effusion. Moderate severity soft tissue swelling is seen adjacent to the previously noted fracture site. IMPRESSION: 1. Left knee replacement with an acute, impacted fracture of the distal left  femur extending to the level of the distal femoral prosthesis. 2. Moderate sized joint effusion with moderate severity soft tissue swelling. Electronically Signed   By: Virgina Norfolk M.D.   On: 01/08/2021 01:16    Review of Systems  Constitutional: Negative.   HENT: Positive for hearing loss.   Eyes: Negative.   Respiratory: Negative.   Cardiovascular: Negative.   Gastrointestinal: Positive for heartburn.  Genitourinary: Negative.   Musculoskeletal: Positive for joint pain.  Skin: Negative.   Neurological: Negative.   Endo/Heme/Allergies: Negative.   Psychiatric/Behavioral: Negative.     Blood pressure 98/77, pulse 76, temperature 98.7 F (37.1 C), temperature source Axillary, resp. rate 18, height 5\' 4"  (1.626 m), weight 65.4 kg, SpO2 95 %. Physical Exam Constitutional:      Appearance: She is well-developed.  HENT:     Head: Normocephalic.  Eyes:     Pupils: Pupils are equal, round, and reactive to light.  Neck:     Thyroid: No thyromegaly.     Vascular: No JVD.     Trachea: No tracheal deviation.  Cardiovascular:     Rate and Rhythm: Normal rate and regular rhythm.     Pulses: Intact distal pulses.  Pulmonary:     Effort: Pulmonary effort is normal. No respiratory distress.     Breath sounds: Normal breath sounds. No wheezing.  Abdominal:     Palpations: Abdomen is soft.     Tenderness: There is no abdominal tenderness. There is no guarding.  Musculoskeletal:     Cervical back: Neck supple.     Right upper leg: Swelling, deformity, tenderness and bony tenderness present.  Lymphadenopathy:     Cervical: No cervical adenopathy.  Skin:    General: Skin is warm and dry.  Neurological:     Mental Status: She is alert and oriented to person, place, and time.  Psychiatric:        Mood and Affect: Mood and affect normal.     Assessment/Plan: Fracture of the distal left femur extending to the level of distal femoral prosthesis   Discussed the need for  surgery  Regular diet now  NPO for Thursday surgery  Plan is for Dr. Lyla Glassing to preform the surgery on Thursday  Risks, benefits and expectations were discussed with the patient.  Risks including but not limited to the risk of anesthesia, blood clots, nerve damage, blood vessel damage, failure of the prosthesis, infection and up to and including death.  Patient understand the risks, benefits and expectations and wishes to proceed with surgery.    [facility] 01/09/2021, 7:39 AM

## 2021-01-09 NOTE — Progress Notes (Signed)
[facility] chatted concerning the heparin sq and questionable surgery ordered to hold am dose to be on safe side. Arthor Captain LPN

## 2021-01-09 NOTE — H&P (View-Only) (Signed)
Reason for Consult: Fracture of the distal left femur extending to the level of distal femoral prosthesis Referring Physician: ED Physician  [name] is an 87 y.o. female.  HPI: The patient is a 87 year old female with hypothyroidism, PVD, anxiety, comes into the hospital with leg pain.  She apparently got up from sleep to go to the bathroom with assistance from her daughter, reached out to grab a hold of the back of the recliner, next and fell down.  No reports of loss of consciousness, chest pain, palpitations.  Imaging in the ED showed acute impacted fracture of the distal left femur extending to the level of distal femoral prosthesis.  Patient was admitted to the hospital and orthopedic surgery was consulted.  Conversation was had with the patient regarding the need for surgery.  Discussed the distal femoral replacement.  Risks, benefits and expectations were discussed with the patient.  Risks including but not limited to the risk of anesthesia, blood clots, nerve damage, blood vessel damage, failure of the prosthesis, infection and up to and including death.  Patient understand the risks, benefits and expectations and wishes to proceed with surgery.  Past Medical History:  Diagnosis Date  . Arthritis   . Gastritis, Helicobacter pylori    Treated with Pylera, 2010  . Hypertension   . Hypothyroidism     Past Surgical History:  Procedure Laterality Date  . ABDOMINAL HYSTERECTOMY    . CARPAL TUNNEL RELEASE    . CERVICAL FUSION    . COLON SURGERY  2010   Dr. Georgette Dover: secondary to cecal mass. Negative for malignancy  . COLONOSCOPY  May 2010   Dr. Oneida Alar: large cecal mass, referred to Dr. Georgette Dover  . ESOPHAGOGASTRODUODENOSCOPY  May 2010   normal esophagus, no Barrett's, moderate gastritis, +H.PYLORI  . JOINT REPLACEMENT     bilateral knee replacement, left shoulder surgery    Family History  Problem Relation Age of Onset  . Colon cancer Neg Hx     Social History:  reports that she  has never smoked. She has never used smokeless tobacco. She reports that she does not drink alcohol and does not use drugs.  Allergies:  Allergies  Allergen Reactions  . Amoxicillin Other (See Comments)    Burning sensation.  Marland Kitchen Penicillins Other (See Comments)    Burning sensation.     Results for orders placed or performed during the hospital encounter of 01/08/21 (from the past 48 hour(s))  Resp Panel by RT-PCR (Flu A&B, Covid) Nasopharyngeal Swab     Status: None   Collection Time: 01/08/21  2:27 AM   Specimen: Nasopharyngeal Swab; Nasopharyngeal(NP) swabs in vial transport medium  Result Value Ref Range   SARS Coronavirus 2 by RT PCR NEGATIVE NEGATIVE    Comment: (NOTE) SARS-CoV-2 target nucleic acids are NOT DETECTED.  The SARS-CoV-2 RNA is generally detectable in upper respiratory specimens during the acute phase of infection. The lowest concentration of SARS-CoV-2 viral copies this assay can detect is 138 copies/mL. A negative result does not preclude SARS-Cov-2 infection and should not be used as the sole basis for treatment or other patient management decisions. A negative result may occur with  improper specimen collection/handling, submission of specimen other than nasopharyngeal swab, presence of viral mutation(s) within the areas targeted by this assay, and inadequate number of viral copies(<138 copies/mL). A negative result must be combined with clinical observations, patient history, and epidemiological information. The expected result is Negative.  Fact Sheet for Patients:  EntrepreneurPulse.com.au  Fact  Sheet for Healthcare Providers:  IncredibleEmployment.be  This test is no t yet approved or cleared by the [place] FDA and  has been authorized for detection and/or diagnosis of SARS-CoV-2 by FDA under an Emergency Use Authorization (EUA). This EUA will remain  in effect (meaning this test can be used) for the  duration of the COVID-19 declaration under Section 564(b)(1) of the Act, 21 U.S.C.section 360bbb-3(b)(1), unless the authorization is terminated  or revoked sooner.       Influenza A by PCR NEGATIVE NEGATIVE   Influenza B by PCR NEGATIVE NEGATIVE    Comment: (NOTE) The Xpert Xpress SARS-CoV-2/FLU/RSV plus assay is intended as an aid in the diagnosis of influenza from Nasopharyngeal swab specimens and should not be used as a sole basis for treatment. Nasal washings and aspirates are unacceptable for Xpert Xpress SARS-CoV-2/FLU/RSV testing.  Fact Sheet for Patients: EntrepreneurPulse.com.au  Fact Sheet for Healthcare Providers: IncredibleEmployment.be  This test is not yet approved or cleared by the [place] FDA and has been authorized for detection and/or diagnosis of SARS-CoV-2 by FDA under an Emergency Use Authorization (EUA). This EUA will remain in effect (meaning this test can be used) for the duration of the COVID-19 declaration under Section 564(b)(1) of the Act, 21 U.S.C. section 360bbb-3(b)(1), unless the authorization is terminated or revoked.  Performed at [facility], [address] [place], [place] [address]   CBC     Status: Abnormal   Collection Time: 01/08/21  2:28 AM  Result Value Ref Range   WBC 13.3 (H) 4.0 - 10.5 K/uL   RBC 3.49 (L) 3.87 - 5.11 MIL/uL   Hemoglobin 10.3 (L) 12.0 - 15.0 g/dL   HCT 31.4 (L) 36.0 - 46.0 %   MCV 90.0 80.0 - 100.0 fL   MCH 29.5 26.0 - 34.0 pg   MCHC 32.8 30.0 - 36.0 g/dL   RDW 13.7 11.5 - 15.5 %   Platelets 266 150 - 400 K/uL   nRBC 0.0 0.0 - 0.2 %    Comment: Performed at [facility], [address], [place], [place] [address]  Basic metabolic panel     Status: Abnormal   Collection Time: 01/08/21  2:28 AM  Result Value Ref Range   Sodium 130 (L) 135 - 145 mmol/L   Potassium 3.7 3.5 - 5.1 mmol/L   Chloride 94 (L) 98 - 111 mmol/L   CO2 26 22 - 32 mmol/L   Glucose, Bld 148 (H)  70 - 99 mg/dL    Comment: Glucose reference range applies only to samples taken after fasting for at least 8 hours.   BUN 18 8 - 23 mg/dL   Creatinine, Ser 1.20 (H) 0.44 - 1.00 mg/dL   Calcium 9.0 8.9 - 10.3 mg/dL   GFR, Estimated 43 (L) >60 mL/min    Comment: (NOTE) Calculated using the CKD-EPI Creatinine Equation (2021)    Anion gap 10 5 - 15    Comment: Performed at [facility], [address], [place], [place] [address]  Comprehensive metabolic panel     Status: Abnormal   Collection Time: 01/08/21  8:11 AM  Result Value Ref Range   Sodium 133 (L) 135 - 145 mmol/L   Potassium 4.0 3.5 - 5.1 mmol/L   Chloride 95 (L) 98 - 111 mmol/L   CO2 26 22 - 32 mmol/L   Glucose, Bld 133 (H) 70 - 99 mg/dL    Comment: Glucose reference range applies only to samples taken after fasting for at  least 8 hours.   BUN 17 8 - 23 mg/dL   Creatinine, Ser 1.01 (H) 0.44 - 1.00 mg/dL   Calcium 9.3 8.9 - 10.3 mg/dL   Total Protein 6.7 6.5 - 8.1 g/dL   Albumin 3.5 3.5 - 5.0 g/dL   AST 22 15 - 41 U/L   ALT 13 0 - 44 U/L   Alkaline Phosphatase 35 (L) 38 - 126 U/L   Total Bilirubin 0.8 0.3 - 1.2 mg/dL   GFR, Estimated 53 (L) >60 mL/min    Comment: (NOTE) Calculated using the CKD-EPI Creatinine Equation (2021)    Anion gap 12 5 - 15    Comment: Performed at [facility], [address] [place], [place] [address]  Magnesium     Status: None   Collection Time: 01/08/21  8:11 AM  Result Value Ref Range   Magnesium 2.0 1.7 - 2.4 mg/dL    Comment: Performed at [facility], [address], [place], [place] [address]  CBC WITH DIFFERENTIAL     Status: Abnormal   Collection Time: 01/08/21  8:11 AM  Result Value Ref Range   WBC 10.3 4.0 - 10.5 K/uL   RBC 3.33 (L) 3.87 - 5.11 MIL/uL   Hemoglobin 9.7 (L) 12.0 - 15.0 g/dL   HCT 29.4 (L) 36.0 - 46.0 %   MCV 88.3 80.0 - 100.0 fL   MCH 29.1 26.0 - 34.0 pg   MCHC 33.0 30.0 - 36.0 g/dL   RDW 13.7 11.5 - 15.5 %   Platelets 244 150 - 400 K/uL   nRBC 0.0 0.0 - 0.2  %   Neutrophils Relative % 88 %   Neutro Abs 9.0 (H) 1.7 - 7.7 K/uL   Lymphocytes Relative 5 %   Lymphs Abs 0.5 (L) 0.7 - 4.0 K/uL   Monocytes Relative 7 %   Monocytes Absolute 0.7 0.1 - 1.0 K/uL   Eosinophils Relative 0 %   Eosinophils Absolute 0.0 0.0 - 0.5 K/uL   Basophils Relative 0 %   Basophils Absolute 0.0 0.0 - 0.1 K/uL   Immature Granulocytes 0 %   Abs Immature Granulocytes 0.03 0.00 - 0.07 K/uL    Comment: Performed at [facility], [address], [place], [place] [address]  TSH     Status: Abnormal   Collection Time: 01/08/21  8:11 AM  Result Value Ref Range   TSH 0.259 (L) 0.350 - 4.500 uIU/mL    Comment: Performed by a 3rd Generation assay with a functional sensitivity of <=0.01 uIU/mL. Performed at [facility], [address] [place], [place] [address]     DG Ankle Complete Right  Result Date: 01/08/2021 CLINICAL DATA:  Status post fall. EXAM: RIGHT ANKLE - COMPLETE 3+ VIEW COMPARISON:  None. FINDINGS: There is no evidence of fracture, dislocation, or joint effusion. There is a moderate-sized plantar calcaneal spur. Degenerative changes are seen within the visualized portion of the mid right foot. Mild diffuse soft tissue swelling is seen. IMPRESSION: 1. Diffuse soft tissue swelling without an acute osseous abnormality. Electronically Signed   By: Virgina Norfolk M.D.   On: 01/08/2021 01:18   DG Knee Complete 4 Views Left  Result Date: 01/08/2021 CLINICAL DATA:  Status post fall. EXAM: LEFT KNEE - COMPLETE 4+ VIEW COMPARISON:  None. FINDINGS: A left knee replacement is seen. An acute, impacted fracture deformity of the distal left femur is also noted. This extends to the level of the distal femoral prosthesis. There is no evidence of dislocation. There is  a moderate sized joint effusion. Moderate severity soft tissue swelling is seen adjacent to the previously noted fracture site. IMPRESSION: 1. Left knee replacement with an acute, impacted fracture of the distal left  femur extending to the level of the distal femoral prosthesis. 2. Moderate sized joint effusion with moderate severity soft tissue swelling. Electronically Signed   By: Virgina Norfolk M.D.   On: 01/08/2021 01:16    Review of Systems  Constitutional: Negative.   HENT: Positive for hearing loss.   Eyes: Negative.   Respiratory: Negative.   Cardiovascular: Negative.   Gastrointestinal: Positive for heartburn.  Genitourinary: Negative.   Musculoskeletal: Positive for joint pain.  Skin: Negative.   Neurological: Negative.   Endo/Heme/Allergies: Negative.   Psychiatric/Behavioral: Negative.     Blood pressure 98/77, pulse 76, temperature 98.7 F (37.1 C), temperature source Axillary, resp. rate 18, height 5\' 4"  (1.626 m), weight 65.4 kg, SpO2 95 %. Physical Exam Constitutional:      Appearance: She is well-developed.  HENT:     Head: Normocephalic.  Eyes:     Pupils: Pupils are equal, round, and reactive to light.  Neck:     Thyroid: No thyromegaly.     Vascular: No JVD.     Trachea: No tracheal deviation.  Cardiovascular:     Rate and Rhythm: Normal rate and regular rhythm.     Pulses: Intact distal pulses.  Pulmonary:     Effort: Pulmonary effort is normal. No respiratory distress.     Breath sounds: Normal breath sounds. No wheezing.  Abdominal:     Palpations: Abdomen is soft.     Tenderness: There is no abdominal tenderness. There is no guarding.  Musculoskeletal:     Cervical back: Neck supple.     Right upper leg: Swelling, deformity, tenderness and bony tenderness present.  Lymphadenopathy:     Cervical: No cervical adenopathy.  Skin:    General: Skin is warm and dry.  Neurological:     Mental Status: She is alert and oriented to person, place, and time.  Psychiatric:        Mood and Affect: Mood and affect normal.     Assessment/Plan: Fracture of the distal left femur extending to the level of distal femoral prosthesis   Discussed the need for  surgery  Regular diet now  NPO for Thursday surgery  Plan is for Dr. Lyla Glassing to preform the surgery on Thursday  Risks, benefits and expectations were discussed with the patient.  Risks including but not limited to the risk of anesthesia, blood clots, nerve damage, blood vessel damage, failure of the prosthesis, infection and up to and including death.  Patient understand the risks, benefits and expectations and wishes to proceed with surgery.    Lucille Passy [facility] 01/09/2021, 7:39 AM

## 2021-01-10 ENCOUNTER — Inpatient Hospital Stay (HOSPITAL_COMMUNITY): Payer: Medicare Other

## 2021-01-10 DIAGNOSIS — S72401A Unspecified fracture of lower end of right femur, initial encounter for closed fracture: Secondary | ICD-10-CM | POA: Diagnosis not present

## 2021-01-10 LAB — DIFFERENTIAL
Abs Immature Granulocytes: 0.04 10*3/uL (ref 0.00–0.07)
Basophils Absolute: 0 10*3/uL (ref 0.0–0.1)
Basophils Relative: 0 %
Eosinophils Absolute: 0.1 10*3/uL (ref 0.0–0.5)
Eosinophils Relative: 1 %
Immature Granulocytes: 1 %
Lymphocytes Relative: 10 %
Lymphs Abs: 0.8 10*3/uL (ref 0.7–4.0)
Monocytes Absolute: 0.7 10*3/uL (ref 0.1–1.0)
Monocytes Relative: 10 %
Neutro Abs: 5.7 10*3/uL (ref 1.7–7.7)
Neutrophils Relative %: 78 %

## 2021-01-10 LAB — CBC
HCT: 22.3 % — ABNORMAL LOW (ref 36.0–46.0)
HCT: 24.2 % — ABNORMAL LOW (ref 36.0–46.0)
Hemoglobin: 7.7 g/dL — ABNORMAL LOW (ref 12.0–15.0)
Hemoglobin: 7.9 g/dL — ABNORMAL LOW (ref 12.0–15.0)
MCH: 29.8 pg (ref 26.0–34.0)
MCH: 28.6 pg (ref 26.0–34.0)
MCHC: 34.5 g/dL (ref 30.0–36.0)
MCHC: 32.6 g/dL (ref 30.0–36.0)
MCV: 86.4 fL (ref 80.0–100.0)
MCV: 87.7 fL (ref 80.0–100.0)
Platelets: 187 10*3/uL (ref 150–400)
Platelets: 224 10*3/uL (ref 150–400)
RBC: 2.58 MIL/uL — ABNORMAL LOW (ref 3.87–5.11)
RBC: 2.76 MIL/uL — ABNORMAL LOW (ref 3.87–5.11)
RDW: 13.6 % (ref 11.5–15.5)
RDW: 13.8 % (ref 11.5–15.5)
WBC: 8 10*3/uL (ref 4.0–10.5)
WBC: 7.4 10*3/uL (ref 4.0–10.5)
nRBC: 0 % (ref 0.0–0.2)
nRBC: 0 % (ref 0.0–0.2)

## 2021-01-10 LAB — IRON AND TIBC
Iron: 18 ug/dL — ABNORMAL LOW (ref 28–170)
Saturation Ratios: 8 % — ABNORMAL LOW (ref 10.4–31.8)
TIBC: 221 ug/dL — ABNORMAL LOW (ref 250–450)
UIBC: 203 ug/dL

## 2021-01-10 LAB — BASIC METABOLIC PANEL
Anion gap: 9 (ref 5–15)
BUN: 12 mg/dL (ref 8–23)
CO2: 27 mmol/L (ref 22–32)
Calcium: 8.7 mg/dL — ABNORMAL LOW (ref 8.9–10.3)
Chloride: 95 mmol/L — ABNORMAL LOW (ref 98–111)
Creatinine, Ser: 0.93 mg/dL (ref 0.44–1.00)
GFR, Estimated: 59 mL/min — ABNORMAL LOW (ref >60)
Glucose, Bld: 98 mg/dL (ref 70–99)
Potassium: 3.8 mmol/L (ref 3.5–5.1)
Sodium: 131 mmol/L — ABNORMAL LOW (ref 135–145)

## 2021-01-10 LAB — VITAMIN B12: Vitamin B-12: 96 pg/mL — ABNORMAL LOW (ref 180–914)

## 2021-01-10 LAB — FIBRINOGEN: Fibrinogen: 519 mg/dL — ABNORMAL HIGH (ref 210–475)

## 2021-01-10 LAB — TECHNOLOGIST SMEAR REVIEW: Plt Morphology: NORMAL

## 2021-01-10 LAB — PROTIME-INR
INR: 1.2 (ref 0.8–1.2)
Prothrombin Time: 14.8 seconds (ref 11.4–15.2)

## 2021-01-10 LAB — RETICULOCYTES
Immature Retic Fract: 12.3 % (ref 2.3–15.9)
RBC.: 2.64 MIL/uL — ABNORMAL LOW (ref 3.87–5.11)
Retic Count, Absolute: 50.7 10*3/uL (ref 19.0–186.0)
Retic Ct Pct: 1.9 % (ref 0.4–3.1)

## 2021-01-10 LAB — FOLATE: Folate: 22.2 ng/mL (ref >5.9)

## 2021-01-10 LAB — FERRITIN: Ferritin: 236 ng/mL (ref 11–307)

## 2021-01-10 MED ORDER — SODIUM CHLORIDE 0.9 % IV SOLN
510.0000 mg | Freq: Once | INTRAVENOUS | Status: AC
  Administered 2021-01-10: 510 mg via INTRAVENOUS
  Filled 2021-01-10: qty 17

## 2021-01-10 MED ORDER — CYANOCOBALAMIN 1000 MCG/ML IJ SOLN
1000.0000 ug | Freq: Every day | INTRAMUSCULAR | Status: AC
  Administered 2021-01-10 – 2021-01-16 (×6): 1000 ug via SUBCUTANEOUS
  Filled 2021-01-10 (×7): qty 1

## 2021-01-10 MED ORDER — VITAMIN B-12 1000 MCG PO TABS
1000.0000 ug | ORAL_TABLET | Freq: Every day | ORAL | Status: DC
  Administered 2021-01-17: 1000 ug via ORAL
  Filled 2021-01-10: qty 1

## 2021-01-10 NOTE — Plan of Care (Signed)
  Problem: Education: Goal: Knowledge of General Education information will improve Description: Including pain rating scale, medication(s)/side effects and non-pharmacologic comfort measures Outcome: Progressing   Problem: Nutrition: Goal: Adequate nutrition will be maintained Outcome: Progressing   Problem: Safety: Goal: Ability to remain free from injury will improve Outcome: Progressing   Problem: Health Behavior/Discharge Planning: Goal: Ability to manage health-related needs will improve Outcome: Not Progressing   Problem: Activity: Goal: Risk for activity intolerance will decrease Outcome: Not Progressing   Problem: Coping: Goal: Level of anxiety will decrease Outcome: Not Progressing   Problem: Pain Managment: Goal: General experience of comfort will improve Outcome: Not Progressing

## 2021-01-10 NOTE — Progress Notes (Addendum)
[facility] Progress Note  Patient: [name]    ZOX:[id number]  DOA: 01/08/2021     Date of Service: the patient was seen and examined on 01/10/2021  Brief hospital course: Past medical history of anxiety, hypothyroidism, PVD.  Presents with complaints of leg pain after a fall.  Found to have acute impacted fracture of left distal femur.  Orthopedic consulted.  Currently scheduled for surgery on Thursday.  Also have anemia and B12 deficiency. Currently plan is monitor preop stability and postop recovery.  Assessment and Plan: 1.  Periprosthetic distal femur fracture Orthopedic consulted. Fracture occurred after mechanical fall. Initially admitted to [facility] transfer to [facility]. Currently surgery scheduled on 2/24. Will monitor.  Pain control per orthopedic. Currently holding off on DVT prophylaxis. Low to moderate risk preoperatively for cardiac events only because of her old age. Per [place] Safe score: 1.9 % Risk of myocardial infarction or cardiac arrest, intraoperatively or up to 30 days post-op.  Chest x-ray and EKG unremarkable.  2.  Hypothyroidism Continue Synthroid. TSH significantly low. Dose decreased from 100-88 MCG. We will check free T4 as well.  3.  Essential hypertension Continue metoprolol.  4.  PVD Patient on aspirin and cilostazol.  Currently on hold in the setting of anemia.  5.  Iron deficiency as well as B12 deficiency anemia. Hemoglobin significantly low on 2/23.  On recheck hemoglobin stable. Going down throughout this hospitalization likely in the setting of dilution. Iron level significantly low.  B12 significantly low.  Will replace parenterally for now. May require transfusion postoperatively. Pt and daughter consent.  Monitor for now.  6.  AKI Mild. Improving. Monitor.  7.  Hyponatremia Likely from stress reaction from pain currently improving.  Monitor.  Lasix on hold.  Diet: Cardiac diet DVT Prophylaxis:   SCDs  Start: 01/08/21 0310   Advance goals of care discussion: Full code  Family Communication: family was present at bedside. The pt provided permission to discuss medical plan with the family. Opportunity was given to ask question and all questions were answered satisfactorily.   Disposition:  Status is: Inpatient  Remains inpatient appropriate because:Ongoing diagnostic testing needed not appropriate for outpatient work up  Dispo: The patient is from: Home              Anticipated d/c is to: SNF              Anticipated d/c date is: 3 days              Patient currently is not medically stable to d/c.   Difficult to place patient   Subjective: No nausea no vomiting.  No fever no chills.  Reports pain well controlled.  Minimal oral intake.  Frustrated that she is alone in her room.  Physical Exam:  General: Appear in mild distress, no Rash; Oral Mucosa Clear, moist. no Abnormal Neck Mass Or lumps, Conjunctiva normal  Cardiovascular: S1 and S2 Present, no Murmur, Respiratory: good respiratory effort, Bilateral Air entry present and CTA, no Crackles, no wheezes Abdomen: Bowel Sound present, Soft and no tenderness Extremities: no Pedal edema Neurology: alert and oriented to time, place, and person affect appropriate. no new focal deficit Gait not checked due to patient safety concerns  Vitals:   01/09/21 2026 01/10/21 0521 01/10/21 0849 01/10/21 1600  BP: (!) 107/51 127/73 (!) 111/51 110/60  Pulse: 88 90 79 78  Resp: 16 18 14 17   Temp: 98.5 F (36.9 C) 98.5 F (36.9 C)  98.8 F (37.1 C) (!) 97.4 F (36.3 C)  TempSrc: Oral Oral Oral Oral  SpO2: 97% 96% 97% 96%  Weight:      Height:       No intake or output data in the 24 hours ending 01/10/21 1632 Filed Weights   01/08/21 0043 01/08/21 2336  Weight: 61.7 kg 65.4 kg    Data Reviewed: I have personally reviewed and interpreted daily labs, tele strips, imaging. I reviewed all nursing notes, pharmacy notes, vitals, pertinent  old records I have discussed plan of care as described above with RN and patient/family.  CBC: Recent Labs  Lab 01/08/21 0228 01/08/21 0811 01/10/21 0305 01/10/21 1036  WBC 13.3* 10.3 8.0 7.4  NEUTROABS  --  9.0*  --  5.7  HGB 10.3* 9.7* 7.7* 7.9*  HCT 31.4* 29.4* 22.3* 24.2*  MCV 90.0 88.3 86.4 87.7  PLT 266 244 187 277   Basic Metabolic Panel: Recent Labs  Lab 01/08/21 0228 01/08/21 0811 01/10/21 0305  NA 130* 133* 131*  K 3.7 4.0 3.8  CL 94* 95* 95*  CO2 26 26 27   GLUCOSE 148* 133* 98  BUN 18 17 12   CREATININE 1.20* 1.01* 0.93  CALCIUM 9.0 9.3 8.7*  MG  --  2.0  --     Studies: CT KNEE LEFT WO CONTRAST  Result Date: 01/09/2021 CLINICAL DATA:  Distal femur periprosthetic fracture after fall. EXAM: CT OF THE LEFT KNEE WITHOUT CONTRAST TECHNIQUE: Multidetector CT imaging of the left knee was performed according to the standard protocol. Multiplanar CT image reconstructions were also generated. COMPARISON:  Left knee x-rays from yesterday. FINDINGS: Bones/Joint/Cartilage Again seen is an acute impacted and displaced fracture of the distal femoral metaphysis there is up to 1.9 cm of medial displacement and 4.5 cm of impaction. No dislocation. Prior left total knee arthroplasty. No evidence of hardware failure or loosening. Lipohemarthrosis. Osteopenia. Ligaments Ligaments are suboptimally evaluated by CT. Muscles and Tendons Grossly intact. Soft tissue Soft tissue hematoma around the fracture site. IMPRESSION: 1. Acute impacted and displaced distal femoral periprosthetic fracture as described above. Electronically Signed   By: Titus Dubin M.D.   On: 01/09/2021 18:41   DG CHEST PORT 1 VIEW  Result Date: 01/10/2021 CLINICAL DATA:  Cough.  Preoperative assessment for femur fracture EXAM: PORTABLE CHEST 1 VIEW COMPARISON:  Apr 16, 2009 FINDINGS: There is atelectatic change in the left base. The lungs elsewhere are clear. Heart is upper normal in size with pulmonary vascularity  normal. There is aortic atherosclerosis. Total shoulder replacement noted on the left. Arthropathy in right shoulder region with chronic erosive change right humeral head. IMPRESSION: Atelectatic change left base. No edema or airspace opacity. Stable cardiac silhouette. Status post total shoulder replacement on the left. Chronic humeral head erosion and arthropathy right shoulder. Electronically Signed   By: Lowella Grip III M.D.   On: 01/10/2021 11:57   DG Foot Complete Right  Result Date: 01/10/2021 CLINICAL DATA:  Pain following fall EXAM: RIGHT FOOT COMPLETE - 3+ VIEW COMPARISON:  None. FINDINGS: Frontal, oblique, and lateral views were obtained. Bones are osteoporotic. There is evidence of an old healed fracture of the second proximal phalanx with remodeling. There is no acute fracture or dislocation. There is mild hallux valgus deformity at the first MTP joint with bony overgrowth medially in this area. There is calcification in the medial aspect of the first MTP joint. There is mild narrowing of all PIP and D IP joints. No erosive changes. There  is spurring in the dorsal midfoot. There is an inferior calcaneal spur. IMPRESSION: Old healed fracture proximal second phalanx. No acute fracture or dislocation. Bones osteoporotic. Narrowing multiple distal joints as well as spurring in the midfoot. Probable arthropathic calcification along the medial aspect of the first MTP joint. Mild hallux valgus deformity at the first MTP joint. There is an inferior calcaneal spur. Electronically Signed   By: Lowella Grip III M.D.   On: 01/10/2021 11:56   DG HIP PORT UNILAT WITH PELVIS 1V LEFT  Result Date: 01/09/2021 CLINICAL DATA:  Femur fracture EXAM: DG HIP (WITH OR WITHOUT PELVIS) 1V PORT LEFT COMPARISON:  CT 01/09/2021, radiograph 01/08/2021 FINDINGS: Pubic symphysis and rami appear intact. There are degenerative changes of both hips. No fracture or malalignment of the proximal femurs. IMPRESSION: No  acute osseous abnormality. Electronically Signed   By: Donavan Foil M.D.   On: 01/09/2021 23:38    Scheduled Meds: . cyanocobalamin  1,000 mcg Subcutaneous Q1200   Followed by  . [START ON 01/17/2021] vitamin B-12  1,000 mcg Oral Daily  . levothyroxine  88 mcg Oral QAC breakfast  . LORazepam  0.5 mg Oral BID  . metoprolol tartrate  100 mg Oral BID  . pantoprazole  40 mg Oral Daily   Continuous Infusions: . ferumoxytol     PRN Meds: acetaminophen **OR** acetaminophen, HYDROcodone-acetaminophen, morphine injection, ondansetron **OR** ondansetron (ZOFRAN) IV, oxyCODONE  Time spent: 35 minutes  Author: Berle Mull, MD [facility] 01/10/2021 4:32 PM  To reach On-call, see care teams to locate the attending and reach out via [email or web address]. Between 7PM-7AM, please contact night-coverage If you still have difficulty reaching the attending provider, please page the [facility] (Director on Call) for [facility] on amion for assistance.

## 2021-01-11 DIAGNOSIS — S72401A Unspecified fracture of lower end of right femur, initial encounter for closed fracture: Secondary | ICD-10-CM | POA: Diagnosis not present

## 2021-01-11 LAB — TYPE AND SCREEN
ABO/RH(D): O NEG
Antibody Screen: NEGATIVE

## 2021-01-11 LAB — CBC
HCT: 25.2 % — ABNORMAL LOW (ref 36.0–46.0)
Hemoglobin: 8.4 g/dL — ABNORMAL LOW (ref 12.0–15.0)
MCH: 29 pg (ref 26.0–34.0)
MCHC: 33.3 g/dL (ref 30.0–36.0)
MCV: 86.9 fL (ref 80.0–100.0)
Platelets: 242 10*3/uL (ref 150–400)
RBC: 2.9 MIL/uL — ABNORMAL LOW (ref 3.87–5.11)
RDW: 13.9 % (ref 11.5–15.5)
WBC: 9.2 10*3/uL (ref 4.0–10.5)
nRBC: 0 % (ref 0.0–0.2)

## 2021-01-11 LAB — SURGICAL PCR SCREEN
MRSA, PCR: NEGATIVE
Staphylococcus aureus: NEGATIVE

## 2021-01-11 LAB — PREPARE RBC (CROSSMATCH)

## 2021-01-11 MED ORDER — ONDANSETRON HCL 4 MG/2ML IJ SOLN
INTRAMUSCULAR | Status: AC
  Filled 2021-01-11: qty 2

## 2021-01-11 MED ORDER — FENTANYL CITRATE (PF) 250 MCG/5ML IJ SOLN
INTRAMUSCULAR | Status: AC
  Filled 2021-01-11: qty 5

## 2021-01-11 MED ORDER — DEXAMETHASONE SODIUM PHOSPHATE 10 MG/ML IJ SOLN
INTRAMUSCULAR | Status: AC
  Filled 2021-01-11: qty 1

## 2021-01-11 MED ORDER — HALOPERIDOL LACTATE 5 MG/ML IJ SOLN: 2.0000 mg | Freq: Four times a day (QID) | INTRAMUSCULAR | Status: DC | PRN

## 2021-01-11 MED ORDER — LORAZEPAM 0.5 MG PO TABS
0.5000 mg | ORAL_TABLET | Freq: Four times a day (QID) | ORAL | Status: DC | PRN
  Administered 2021-01-13 – 2021-01-14 (×2): 0.5 mg via ORAL
  Filled 2021-01-11 (×2): qty 1

## 2021-01-11 MED ORDER — SODIUM CHLORIDE 0.9% IV SOLUTION: Freq: Once | INTRAVENOUS | Status: AC

## 2021-01-11 NOTE — Progress Notes (Signed)
[facility] Progress Note  Patient: [name]    XNA:[id number]  DOA: 01/08/2021     Date of Service: the patient was seen and examined on 01/11/2021  Brief hospital course: Past medical history of anxiety, hypothyroidism, PVD.  Presents with complaints of leg pain after a fall.  Found to have acute impacted fracture of left distal femur.  Orthopedic consulted.  Currently scheduled for surgery on Thursday.  Also have anemia and B12 deficiency. Currently plan is monitor preop stability and postop recovery.  Assessment and Plan: 1.  Periprosthetic distal femur fracture Orthopedic consulted. Fracture occurred after mechanical fall. Initially admitted to [facility] transfer to [facility]. Pain control per orthopedic. Currently holding off on DVT prophylaxis. Low to moderate risk preoperatively for cardiac events only because of her old age. Per Lyndel Safe score: 1.9 % Risk of myocardial infarction or cardiac arrest, intraoperatively or up to 30 days post-op.  Chest x-ray and EKG unremarkable. Surgery was urgently scheduled on 2/24.  Due to patient's agitation surgery currently canceled.  Orthopedic also recommending goal hemoglobin of more than 10 and recommend PRBC transfusion before surgery.  They are also worried about T-max of 100.9 although it appears to be his previous value as there is no further trend of elevated pressures after that. Patient also does not appear to have any evidence of active infection for now. Surgery currently postponed and scheduled on Sunday.  N.p.o. after midnight on Saturday.  2.  Hypothyroidism Continue Synthroid. TSH significantly low. Dose decreased from 100-88 MCG. We will check free T4 as well.  3.  Essential hypertension Continue metoprolol.  4.  PVD Patient on aspirin and cilostazol.  Currently on hold in the setting of anemia.  5.  Iron deficiency as well as B12 deficiency anemia. Hemoglobin significantly low on 2/23.  On  recheck hemoglobin stable. Going down throughout this hospitalization likely in the setting of dilution. Iron level significantly low.  B12 significantly low.  Will replace parenterally for now. May require transfusion postoperatively. Pt and daughter consent.  Monitor for now.  6.  AKI Mild. Improving. Monitor.  7.  Hyponatremia Likely from stress reaction from pain currently improving.  Monitor.  Lasix on hold.  Diet: Cardiac diet DVT Prophylaxis:   SCDs Start: 01/08/21 0310   Advance goals of care discussion: Full code  Family Communication: no family was present at bedside.  Discussed with daughter on 2/23.  Disposition:  Status is: Inpatient  Remains inpatient appropriate because:Ongoing diagnostic testing needed not appropriate for outpatient work up  Dispo: The patient is from: Home              Anticipated d/c is to: SNF              Anticipated d/c date is: 3 days              Patient currently is not medically stable to d/c.   Difficult to place patient   Subjective: No nausea no vomiting.  No fever no chills.  Reported agitation although appears to be calm and comfortable and agreeable at the time of my evaluation.  Pulled out IVs.  Physical Exam:  General: Appear in mild distress, no Rash; Oral Mucosa Clear, moist. no Abnormal Neck Mass Or lumps, Conjunctiva normal  Cardiovascular: S1 and S2 Present, no Murmur, Respiratory: good respiratory effort, Bilateral Air entry present and CTA, no Crackles, no wheezes Abdomen: Bowel Sound present, Soft and no tenderness Extremities: no Pedal edema Neurology:  alert and oriented to time, place, and person affect appropriate. no new focal deficit Gait not checked due to patient safety concerns    Vitals:   01/11/21 1140 01/11/21 1452 01/11/21 1543 01/11/21 1610  BP: (!) 142/77 (!) 141/78 (P) 132/74 (!) 120/53  Pulse: 75 81 (P) 91 83  Resp: 18 18 (P) 16 17  Temp: 98.5 F (36.9 C) 98.5 F (36.9 C) (P) 98.5 F (36.9  C) 99 F (37.2 C)  TempSrc: Oral Oral (P) Oral Axillary  SpO2: 98% 96% (P) 97% 99%  Weight:      Height:        Intake/Output Summary (Last 24 hours) at 01/11/2021 1622 Last data filed at 01/11/2021 1450 Gross per 24 hour  Intake 467 ml  Output --  Net 467 ml   Filed Weights   01/08/21 0043 01/08/21 2336  Weight: 61.7 kg 65.4 kg    Data Reviewed: I have personally reviewed and interpreted daily labs, tele strips, imaging. I reviewed all nursing notes, pharmacy notes, vitals, pertinent old records I have discussed plan of care as described above with RN and patient/family.  CBC: Recent Labs  Lab 01/08/21 0228 01/08/21 0811 01/10/21 0305 01/10/21 1036 01/11/21 0753  WBC 13.3* 10.3 8.0 7.4 9.2  NEUTROABS  --  9.0*  --  5.7  --   HGB 10.3* 9.7* 7.7* 7.9* 8.4*  HCT 31.4* 29.4* 22.3* 24.2* 25.2*  MCV 90.0 88.3 86.4 87.7 86.9  PLT 266 244 187 224 675   Basic Metabolic Panel: Recent Labs  Lab 01/08/21 0228 01/08/21 0811 01/10/21 0305  NA 130* 133* 131*  K 3.7 4.0 3.8  CL 94* 95* 95*  CO2 26 26 27   GLUCOSE 148* 133* 98  BUN 18 17 12   CREATININE 1.20* 1.01* 0.93  CALCIUM 9.0 9.3 8.7*  MG  --  2.0  --     Studies: No results found.  Scheduled Meds: . cyanocobalamin  1,000 mcg Subcutaneous Q1200   Followed by  . [START ON 01/17/2021] vitamin B-12  1,000 mcg Oral Daily  . levothyroxine  88 mcg Oral QAC breakfast  . metoprolol tartrate  100 mg Oral BID  . pantoprazole  40 mg Oral Daily   Continuous Infusions:  PRN Meds: acetaminophen **OR** acetaminophen, haloperidol lactate, HYDROcodone-acetaminophen, LORazepam, morphine injection, ondansetron **OR** ondansetron (ZOFRAN) IV, oxyCODONE  Time spent: 35 minutes  Author: Berle Mull, MD [facility] 01/11/2021 4:22 PM  To reach On-call, see care teams to locate the attending and reach out via [email or web address]. Between 7PM-7AM, please contact night-coverage If you still have difficulty reaching the  attending provider, please page the [facility] (Director on Call) for [facility] on amion for assistance.

## 2021-01-11 NOTE — Progress Notes (Signed)
Called floor RN, Magda Paganini, regarding hemoglobin 7.9 drawn yesterday am. Patient scheduled for surgery this am. Magda Paganini stated patient was demented refusing blood draws, pulling out lines during the night. RN stated she would reach out to MD to inform him of hemoglobin.

## 2021-01-11 NOTE — Progress Notes (Signed)
Patient refused morning labs and medication. . Patient states confused to time, place, and situation.

## 2021-01-11 NOTE — Progress Notes (Signed)
Patient confused , pulled out IV,and refused lab draw. Patient reoriented to time, place, and situation.will continue to monitor.

## 2021-01-11 NOTE — TOC CAGE-AID Note (Signed)
Transition of Care [facility]) - CAGE-AID Screening   Patient Details  Name: [name] MRN: [id number] Date of Birth: 07-13-1929  [facility] CM/SW Contact:    Sharin Mons, RN Phone Number: 01/11/2021, 12:20 PM   Clinical Narrative:  Pt denies recreational drug and ETOH usage.  CAGE-AID Screening:    Have You Ever Felt You Ought to Cut Down on Your Drinking or Drug Use?: No Have People Annoyed You By Critizing Your Drinking Or Drug Use?: No Have You Felt Bad Or Guilty About Your Drinking Or Drug Use?: No Have You Ever Had a Drink or Used Drugs First Thing In The Morning to Steady Your Nerves or to Get Rid of a Hangover?: No CAGE-AID Score: 0

## 2021-01-11 NOTE — Progress Notes (Addendum)
Notified Dr. Lyla Glassing of Hgb 7.9 yesterday. MD stated to draw CBC and type and screen stat. If hemoglobin less than 10, transfuse two units PRBCs. Labs placed.

## 2021-01-11 NOTE — Plan of Care (Signed)
  Problem: Safety: Goal: Ability to remain free from injury will improve Outcome: Progressing   

## 2021-01-11 NOTE — Progress Notes (Signed)
IVT consulted for PIV placement prior to surgery to develop a plan for placement of PIV right before surgery to ensure access. Magda Paganini, RN (night shift) states she was advised surgery on hold r/t HGB level.  Currently, no IV meds or fluids.  Day shift RN was present for conversatio. She will assess for placement of PIV and place consult if she is unable to get a PIV.

## 2021-01-11 NOTE — Progress Notes (Addendum)
Surgery planned for today. Notified by RN that patient has been removing lines and refusing lab draws. Hgb 7.9 yesterday. Will need to recheck hgb today and give 2 units PRBCs. Goal hgb around 10.    Patient not ready for OR. PRBCs not completed. Tmax 100.9. Will plan for surgery Sunday. NPO after MN Saturday night.

## 2021-01-12 DIAGNOSIS — S72401A Unspecified fracture of lower end of right femur, initial encounter for closed fracture: Secondary | ICD-10-CM | POA: Diagnosis not present

## 2021-01-12 LAB — BASIC METABOLIC PANEL
Anion gap: 12 (ref 5–15)
BUN: 11 mg/dL (ref 8–23)
CO2: 25 mmol/L (ref 22–32)
Calcium: 9.2 mg/dL (ref 8.9–10.3)
Chloride: 97 mmol/L — ABNORMAL LOW (ref 98–111)
Creatinine, Ser: 0.77 mg/dL (ref 0.44–1.00)
GFR, Estimated: >60 mL/min (ref >60)
Glucose, Bld: 76 mg/dL (ref 70–99)
Potassium: 3.6 mmol/L (ref 3.5–5.1)
Sodium: 134 mmol/L — ABNORMAL LOW (ref 135–145)

## 2021-01-12 LAB — CBC
HCT: 32.8 % — ABNORMAL LOW (ref 36.0–46.0)
Hemoglobin: 11.4 g/dL — ABNORMAL LOW (ref 12.0–15.0)
MCH: 30.5 pg (ref 26.0–34.0)
MCHC: 34.8 g/dL (ref 30.0–36.0)
MCV: 87.7 fL (ref 80.0–100.0)
Platelets: 228 10*3/uL (ref 150–400)
RBC: 3.74 MIL/uL — ABNORMAL LOW (ref 3.87–5.11)
RDW: 13.8 % (ref 11.5–15.5)
WBC: 9.1 10*3/uL (ref 4.0–10.5)
nRBC: 0 % (ref 0.0–0.2)

## 2021-01-12 LAB — MAGNESIUM: Magnesium: 2.1 mg/dL (ref 1.7–2.4)

## 2021-01-12 MED ORDER — ENSURE ENLIVE PO LIQD
237.0000 mL | Freq: Three times a day (TID) | ORAL | Status: DC
  Administered 2021-01-12 – 2021-01-16 (×9): 237 mL via ORAL

## 2021-01-12 MED ORDER — METOPROLOL TARTRATE 5 MG/5ML IV SOLN: 5.0000 mg | INTRAVENOUS | Status: DC | PRN

## 2021-01-12 NOTE — Progress Notes (Signed)
[facility] Progress Note  Patient: [name]    AXK:[id number]  DOA: 01/08/2021     Date of Service: the patient was seen and examined on 01/12/2021  Brief hospital course: Past medical history of anxiety, hypothyroidism, PVD.  Presents with complaints of leg pain after a fall.  Found to have acute impacted fracture of left distal femur.  Orthopedic consulted.  Currently scheduled for surgery on Thursday.  Also have anemia and B12 deficiency. Currently plan is monitor preop stability and postop recovery.  Assessment and Plan: 1.  Periprosthetic distal femur fracture Orthopedic consulted. Fracture occurred after mechanical fall. Initially admitted to [facility] transfer to [facility]. Pain control per orthopedic. Currently holding off on DVT prophylaxis. Low to moderate risk preoperatively for cardiac events only because of her old age. Per Lyndel Safe score: 1.9 % Risk of myocardial infarction or cardiac arrest, intraoperatively or up to 30 days post-op.  Chest x-ray and EKG unremarkable. Surgery was urgently scheduled on 2/24. Surgery currently postponed and scheduled on Sunday.  N.p.o. after midnight on Saturday.  2.  Hypothyroidism Continue Synthroid. TSH significantly low. Dose decreased from 100-88 MCG. We will check free T4 as well.  3.  Essential hypertension Continue metoprolol.  4.  PVD Patient on aspirin and cilostazol.  Currently on hold in the setting of anemia.  5.  Iron deficiency as well as B12 deficiency anemia. Hemoglobin significantly low on 2/23.  On recheck hemoglobin stable. Going down throughout this hospitalization likely in the setting of dilution. Iron level significantly low.  B12 significantly low.  Will replace parenterally for now.  Goal hemoglobin more than 10 per surgery. May require transfusion postoperatively. Pt and daughter consent.  Monitor for now.  6.  AKI Mild. Improving. Monitor.  7.  Hyponatremia Likely from  stress reaction from pain currently improving.  Monitor.  Lasix on hold.  8.  Due to poor dentition family is providing patient's pured diet at home.  Patient not able to chew dysphagia 3 diet well.  Change to dysphagia 2 diet.  Also add Ensure.  9.  Elevated temperature. Spurious 1 value of 100.9 on 2/24.  Currently no further fever.  No leukocytosis.  No clinical evidence of infection as well.  No further work-up.  Diet: Cardiac diet DVT Prophylaxis:   SCDs Start: 01/08/21 0310   Advance goals of care discussion: Full code  Family Communication: no family was present at bedside.  Daughter at bedside. .  Disposition:  Status is: Inpatient  Remains inpatient appropriate because:Ongoing diagnostic testing needed not appropriate for outpatient work up  Dispo: The patient is from: Home              Anticipated d/c is to: SNF              Anticipated d/c date is: 3 days              Patient currently is not medically stable to d/c.   Difficult to place patient   Subjective: No nausea no vomiting.  No fever no chills.  No chest pain.  Abdominal pain.  Pain well controlled.  Physical Exam:  General: Appear in mild distress, no Rash; Oral Mucosa Clear, moist. no Abnormal Neck Mass Or lumps, Conjunctiva normal  Cardiovascular: S1 and S2 Present, no Murmur, Respiratory: good respiratory effort, Bilateral Air entry present and CTA, no Crackles, no wheezes Abdomen: Bowel Sound present, Soft and no tenderness Extremities: no Pedal edema Neurology: alert and oriented  to time, place, and person affect appropriate. no new focal deficit Gait not checked due to patient safety concerns   Vitals:   01/11/21 2200 01/12/21 0641 01/12/21 0740 01/12/21 1535  BP: (!) 121/57 128/66 (!) 111/51 (!) 107/51  Pulse: 68 84 65 80  Resp: 18  15 17   Temp:  98.8 F (37.1 C) 98.3 F (36.8 C) 98.5 F (36.9 C)  TempSrc:  Oral Oral Oral  SpO2:  97% 100% 93%  Weight:      Height:        Intake/Output  Summary (Last 24 hours) at 01/12/2021 1824 Last data filed at 01/12/2021 1500 Gross per 24 hour  Intake 480 ml  Output --  Net 480 ml   Filed Weights   01/08/21 0043 01/08/21 2336  Weight: 61.7 kg 65.4 kg    Data Reviewed: I have personally reviewed and interpreted daily labs, tele strips, imaging. I reviewed all nursing notes, pharmacy notes, vitals, pertinent old records I have discussed plan of care as described above with RN and patient/family.  CBC: Recent Labs  Lab 01/08/21 0811 01/10/21 0305 01/10/21 1036 01/11/21 0753 01/12/21 0047  WBC 10.3 8.0 7.4 9.2 9.1  NEUTROABS 9.0*  --  5.7  --   --   HGB 9.7* 7.7* 7.9* 8.4* 11.4*  HCT 29.4* 22.3* 24.2* 25.2* 32.8*  MCV 88.3 86.4 87.7 86.9 87.7  PLT 244 187 224 242 694   Basic Metabolic Panel: Recent Labs  Lab 01/08/21 0228 01/08/21 0811 01/10/21 0305 01/12/21 0047  NA 130* 133* 131* 134*  K 3.7 4.0 3.8 3.6  CL 94* 95* 95* 97*  CO2 26 26 27 25   GLUCOSE 148* 133* 98 76  BUN 18 17 12 11   CREATININE 1.20* 1.01* 0.93 0.77  CALCIUM 9.0 9.3 8.7* 9.2  MG  --  2.0  --  2.1    Studies: No results found.  Scheduled Meds: . cyanocobalamin  1,000 mcg Subcutaneous Q1200   Followed by  . [START ON 01/17/2021] vitamin B-12  1,000 mcg Oral Daily  . levothyroxine  88 mcg Oral QAC breakfast  . metoprolol tartrate  100 mg Oral BID  . pantoprazole  40 mg Oral Daily   Continuous Infusions:  PRN Meds: acetaminophen **OR** acetaminophen, haloperidol lactate, HYDROcodone-acetaminophen, LORazepam, morphine injection, ondansetron **OR** ondansetron (ZOFRAN) IV, oxyCODONE  Time spent: 35 minutes  Author: Berle Mull, MD [facility] 01/12/2021 6:24 PM  To reach On-call, see care teams to locate the attending and reach out via [email or web address]. Between 7PM-7AM, please contact night-coverage If you still have difficulty reaching the attending provider, please page the [facility] (Director on Call) for [facility] on amion for  assistance.

## 2021-01-13 ENCOUNTER — Encounter (HOSPITAL_COMMUNITY): Payer: Self-pay | Admitting: Family Medicine

## 2021-01-13 DIAGNOSIS — S72401A Unspecified fracture of lower end of right femur, initial encounter for closed fracture: Secondary | ICD-10-CM | POA: Diagnosis not present

## 2021-01-13 LAB — BASIC METABOLIC PANEL
Anion gap: 10 (ref 5–15)
BUN: 18 mg/dL (ref 8–23)
CO2: 28 mmol/L (ref 22–32)
Calcium: 9.4 mg/dL (ref 8.9–10.3)
Chloride: 97 mmol/L — ABNORMAL LOW (ref 98–111)
Creatinine, Ser: 1.01 mg/dL — ABNORMAL HIGH (ref 0.44–1.00)
GFR, Estimated: 53 mL/min — ABNORMAL LOW (ref >60)
Glucose, Bld: 110 mg/dL — ABNORMAL HIGH (ref 70–99)
Potassium: 3 mmol/L — ABNORMAL LOW (ref 3.5–5.1)
Sodium: 135 mmol/L (ref 135–145)

## 2021-01-13 LAB — CBC
HCT: 36.9 % (ref 36.0–46.0)
Hemoglobin: 12.3 g/dL (ref 12.0–15.0)
MCH: 29.6 pg (ref 26.0–34.0)
MCHC: 33.3 g/dL (ref 30.0–36.0)
MCV: 88.9 fL (ref 80.0–100.0)
Platelets: 296 10*3/uL (ref 150–400)
RBC: 4.15 MIL/uL (ref 3.87–5.11)
RDW: 14.1 % (ref 11.5–15.5)
WBC: 10.8 10*3/uL — ABNORMAL HIGH (ref 4.0–10.5)
nRBC: 0 % (ref 0.0–0.2)

## 2021-01-13 LAB — MAGNESIUM: Magnesium: 2.2 mg/dL (ref 1.7–2.4)

## 2021-01-13 LAB — T4, FREE: Free T4: 1.45 ng/dL — ABNORMAL HIGH (ref 0.61–1.12)

## 2021-01-13 MED ORDER — POTASSIUM CHLORIDE 20 MEQ PO PACK
40.0000 meq | PACK | Freq: Three times a day (TID) | ORAL | Status: AC
  Administered 2021-01-13 (×3): 40 meq via ORAL
  Filled 2021-01-13 (×3): qty 2

## 2021-01-13 NOTE — Progress Notes (Addendum)
[facility] Progress Note  Patient: [name]    QBH:[id number]  DOA: 01/08/2021     Date of Service: the patient was seen and examined on 01/13/2021  Brief hospital course: Past medical history of anxiety, hypothyroidism, PVD.  Presents with complaints of leg pain after a fall.  Found to have acute impacted fracture of left distal femur.  Orthopedic consulted.  Currently scheduled for surgery on Sunday.  Also have anemia and B12 deficiency.   Assessment and Plan:  Periprosthetic distal femur fracture Orthopedic consult appreciated Fracture occurred after mechanical fall. Initially admitted to [facility] transfer to [facility] for surgery Pain control per orthopedic. Low to moderate risk preoperatively for cardiac events only because of her old age. Per Lyndel Safe score: 1.9 % Risk of myocardial infarction or cardiac arrest, intraoperatively or up to 30 days post-op.  Chest x-ray and EKG unremarkable. -plan to go to the OR on 2/27   Hypothyroidism TSH significantly low. Dose decreased from 100 to 88 MCG.  Essential hypertension Continue metoprolol.   PVD Patient on aspirin and cilostazol.  Currently on hold in the setting of anemia.   Iron deficiency as well as B12 deficiency anemia. Hemoglobin significantly low on 2/23.  On recheck hemoglobin stable. Going down throughout this hospitalization likely in the setting of dilution. Iron level significantly low.  B12 significantly low.  Will replace parenterally for now.  Goal hemoglobin more than 10 per surgery. -Hgb 12 today   AKI Mild. Monitor.  Hypokalemia -replete  Hyponatremia -resolved   Due to poor dentition family is providing patient's pured diet at home - dysphagia 2 diet.  Also add Ensure.  Suspected Dementia -delirium precautions -B12 replacement    DVT Prophylaxis:   SCDs Start: 01/08/21 0310   Advance goals of care discussion: Full code  Family Communication: no family was  present at bedside-- called daughters to update .  Disposition:  Status is: Inpatient  Remains inpatient appropriate because:Ongoing diagnostic testing needed not appropriate for outpatient work up  Dispo: The patient is from: Home              Anticipated d/c is to: SNF              Anticipated d/c date is: 3 days              Patient currently is not medically stable to d/c.     Subjective: c/o her gown being too large and wanting a smaller one, also say she wants to get up and walk  Physical Exam:   General: Appearance:    elderly female in no acute distress     Lungs:      respirations unlabored  Heart:    Normal heart rate. Normal rhythm.       Neurologic:   Awake, alert, pleasantly confused     Vitals:   01/12/21 1535 01/12/21 1956 01/13/21 0500 01/13/21 0700  BP: (!) 107/51 125/72 120/70 (!) 143/80  Pulse: 80 82 67 92  Resp: 17 15 17 15   Temp: 98.5 F (36.9 C) 98.8 F (37.1 C) 97.9 F (36.6 C) 98 F (36.7 C)  TempSrc: Oral  Axillary Axillary  SpO2: 93% 98% 96% 94%  Weight:   58.8 kg   Height:        Intake/Output Summary (Last 24 hours) at 01/13/2021 0818 Last data filed at 01/12/2021 2100 Gross per 24 hour  Intake 340 ml  Output 1100 ml  Net -760 ml  Filed Weights   01/08/21 0043 01/08/21 2336 01/13/21 0500  Weight: 61.7 kg 65.4 kg 58.8 kg     CBC: Recent Labs  Lab 01/08/21 0811 01/10/21 0305 01/10/21 1036 01/11/21 0753 01/12/21 0047 01/13/21 0207  WBC 10.3 8.0 7.4 9.2 9.1 10.8*  NEUTROABS 9.0*  --  5.7  --   --   --   HGB 9.7* 7.7* 7.9* 8.4* 11.4* 12.3  HCT 29.4* 22.3* 24.2* 25.2* 32.8* 36.9  MCV 88.3 86.4 87.7 86.9 87.7 88.9  PLT 244 187 224 242 228 496   Basic Metabolic Panel: Recent Labs  Lab 01/08/21 0228 01/08/21 0811 01/10/21 0305 01/12/21 0047 01/13/21 0207  NA 130* 133* 131* 134* 135  K 3.7 4.0 3.8 3.6 3.0*  CL 94* 95* 95* 97* 97*  CO2 26 26 27 25 28   GLUCOSE 148* 133* 98 76 110*  BUN 18 17 12 11 18   CREATININE  1.20* 1.01* 0.93 0.77 1.01*  CALCIUM 9.0 9.3 8.7* 9.2 9.4  MG  --  2.0  --  2.1 2.2    Studies: No results found.  Scheduled Meds: . cyanocobalamin  1,000 mcg Subcutaneous Q1200   Followed by  . [START ON 01/17/2021] vitamin B-12  1,000 mcg Oral Daily  . feeding supplement  237 mL Oral TID BM  . levothyroxine  88 mcg Oral QAC breakfast  . metoprolol tartrate  100 mg Oral BID  . pantoprazole  40 mg Oral Daily  . potassium chloride  40 mEq Oral TID   Continuous Infusions:  PRN Meds: acetaminophen **OR** acetaminophen, haloperidol lactate, HYDROcodone-acetaminophen, LORazepam, morphine injection, ondansetron **OR** ondansetron (ZOFRAN) IV, oxyCODONE  Time spent: 25 minutes  Author: Eulogio Bear DO [facility] 01/13/2021 8:18 AM  To reach On-call, see care teams to locate the attending and reach out via [email or web address]. Between 7PM-7AM, please contact night-coverage If you still have difficulty reaching the attending provider, please page the [facility] (Director on Call) for [facility] on amion for assistance.

## 2021-01-13 NOTE — Plan of Care (Signed)
  Problem: Safety: Goal: Ability to remain free from injury will improve Outcome: Progressing   

## 2021-01-13 NOTE — Anesthesia Preprocedure Evaluation (Addendum)
Anesthesia Evaluation  Patient identified by MRN, date of birth, ID band Patient awake    Reviewed: Allergy & Precautions, NPO status , Patient's Chart, lab work & pertinent test results  Airway Mallampati: I  TM Distance: <3 FB Neck ROM: Full    Dental  (+) Edentulous Upper, Edentulous Lower   Pulmonary COPD,     + decreased breath sounds      Cardiovascular hypertension, Pt. on home beta blockers + Peripheral Vascular Disease   Rhythm:Regular Rate:Normal     Neuro/Psych  Headaches, negative psych ROS   GI/Hepatic Neg liver ROS, GERD  Medicated,  Endo/Other  Hypothyroidism   Renal/GU negative Renal ROS     Musculoskeletal  (+) Arthritis ,   Abdominal Normal abdominal exam  (+)   Peds  Hematology negative hematology ROS (+)   Anesthesia Other Findings   Reproductive/Obstetrics                            Anesthesia Physical Anesthesia Plan  ASA: III  Anesthesia Plan: General   Post-op Pain Management:    Induction: Intravenous  PONV Risk Score and Plan: 4 or greater and Ondansetron, Dexamethasone, Amisulpride and Treatment may vary due to age or medical condition  Airway Management Planned: Oral ETT  Additional Equipment: None  Intra-op Plan:   Post-operative Plan: Extubation in OR  Informed Consent: I have reviewed the patients History and Physical, chart, labs and discussed the procedure including the risks, benefits and alternatives for the proposed anesthesia with the patient or authorized representative who has indicated his/her understanding and acceptance.     Consent reviewed with POA  Plan Discussed with: CRNA  Anesthesia Plan Comments:        Anesthesia Quick Evaluation

## 2021-01-14 ENCOUNTER — Encounter (HOSPITAL_COMMUNITY): Payer: Self-pay | Admitting: Family Medicine

## 2021-01-14 ENCOUNTER — Inpatient Hospital Stay (HOSPITAL_COMMUNITY): Payer: Medicare Other

## 2021-01-14 ENCOUNTER — Inpatient Hospital Stay (HOSPITAL_COMMUNITY): Payer: Medicare Other | Admitting: Certified Registered Nurse Anesthetist

## 2021-01-14 ENCOUNTER — Encounter (HOSPITAL_COMMUNITY)
Admission: EM | Disposition: A | Discharge status: Skilled Nursing Facility | Payer: Self-pay | Source: Home / Self Care | Attending: Internal Medicine

## 2021-01-14 DIAGNOSIS — S72401A Unspecified fracture of lower end of right femur, initial encounter for closed fracture: Secondary | ICD-10-CM | POA: Diagnosis not present

## 2021-01-14 DIAGNOSIS — I10 Essential (primary) hypertension: Secondary | ICD-10-CM | POA: Diagnosis not present

## 2021-01-14 DIAGNOSIS — E039 Hypothyroidism, unspecified: Secondary | ICD-10-CM | POA: Diagnosis not present

## 2021-01-14 HISTORY — PX: TOTAL KNEE REVISION: SHX996

## 2021-01-14 LAB — CBC
HCT: 35.9 % — ABNORMAL LOW (ref 36.0–46.0)
Hemoglobin: 12.2 g/dL (ref 12.0–15.0)
MCH: 30.9 pg (ref 26.0–34.0)
MCHC: 34 g/dL (ref 30.0–36.0)
MCV: 90.9 fL (ref 80.0–100.0)
Platelets: 298 10*3/uL (ref 150–400)
RBC: 3.95 MIL/uL (ref 3.87–5.11)
RDW: 15 % (ref 11.5–15.5)
WBC: 11.7 10*3/uL — ABNORMAL HIGH (ref 4.0–10.5)
nRBC: 0 % (ref 0.0–0.2)

## 2021-01-14 LAB — BASIC METABOLIC PANEL
Anion gap: 14 (ref 5–15)
BUN: 14 mg/dL (ref 8–23)
CO2: 21 mmol/L — ABNORMAL LOW (ref 22–32)
Calcium: 9.4 mg/dL (ref 8.9–10.3)
Chloride: 104 mmol/L (ref 98–111)
Creatinine, Ser: 0.88 mg/dL (ref 0.44–1.00)
GFR, Estimated: >60 mL/min (ref >60)
Glucose, Bld: 87 mg/dL (ref 70–99)
Potassium: 4 mmol/L (ref 3.5–5.1)
Sodium: 139 mmol/L (ref 135–145)

## 2021-01-14 SURGERY — TOTAL KNEE REVISION
Anesthesia: General | Site: Knee | Laterality: Left

## 2021-01-14 MED ORDER — ONDANSETRON HCL 4 MG/2ML IJ SOLN
INTRAMUSCULAR | Status: AC
  Filled 2021-01-14: qty 2

## 2021-01-14 MED ORDER — LIDOCAINE 2% (20 MG/ML) 5 ML SYRINGE
INTRAMUSCULAR | Status: AC
  Filled 2021-01-14: qty 5

## 2021-01-14 MED ORDER — LIDOCAINE 2% (20 MG/ML) 5 ML SYRINGE
INTRAMUSCULAR | Status: DC | PRN
  Administered 2021-01-14: 40 mg via INTRAVENOUS

## 2021-01-14 MED ORDER — ROCURONIUM BROMIDE 10 MG/ML (PF) SYRINGE
PREFILLED_SYRINGE | INTRAVENOUS | Status: AC
  Filled 2021-01-14: qty 10

## 2021-01-14 MED ORDER — METOCLOPRAMIDE HCL 5 MG PO TABS: 5.0000 mg | ORAL_TABLET | Freq: Three times a day (TID) | ORAL | Status: DC | PRN

## 2021-01-14 MED ORDER — BUPIVACAINE HCL (PF) 0.5 % IJ SOLN
INTRAMUSCULAR | Status: DC | PRN
  Administered 2021-01-14: 30 mL

## 2021-01-14 MED ORDER — FENTANYL CITRATE (PF) 250 MCG/5ML IJ SOLN
INTRAMUSCULAR | Status: AC
  Filled 2021-01-14: qty 5

## 2021-01-14 MED ORDER — PROPOFOL 10 MG/ML IV BOLUS
INTRAVENOUS | Status: AC
  Filled 2021-01-14: qty 20

## 2021-01-14 MED ORDER — PHENYLEPHRINE HCL-NACL 10-0.9 MG/250ML-% IV SOLN
INTRAVENOUS | Status: DC | PRN
  Administered 2021-01-14: 25 ug/min via INTRAVENOUS

## 2021-01-14 MED ORDER — ONDANSETRON HCL 4 MG PO TABS: 4.0000 mg | ORAL_TABLET | Freq: Four times a day (QID) | ORAL | Status: DC | PRN

## 2021-01-14 MED ORDER — SODIUM CHLORIDE (PF) 0.9 % IJ SOLN
INTRAMUSCULAR | Status: DC | PRN
  Administered 2021-01-14: 30 mL

## 2021-01-14 MED ORDER — KETOROLAC TROMETHAMINE 30 MG/ML IJ SOLN
INTRAMUSCULAR | Status: DC | PRN
  Administered 2021-01-14: 30 mg via INTRA_ARTICULAR

## 2021-01-14 MED ORDER — FENTANYL CITRATE (PF) 100 MCG/2ML IJ SOLN
INTRAMUSCULAR | Status: DC | PRN
  Administered 2021-01-14 (×2): 25 ug via INTRAVENOUS
  Administered 2021-01-14: 50 ug via INTRAVENOUS
  Administered 2021-01-14 (×6): 25 ug via INTRAVENOUS

## 2021-01-14 MED ORDER — MENTHOL 3 MG MT LOZG: 1.0000 | LOZENGE | OROMUCOSAL | Status: DC | PRN

## 2021-01-14 MED ORDER — SODIUM CHLORIDE 0.9 % IR SOLN
Status: DC | PRN
  Administered 2021-01-14: 1000 mL

## 2021-01-14 MED ORDER — ONDANSETRON HCL 4 MG/2ML IJ SOLN: 4.0000 mg | Freq: Four times a day (QID) | INTRAMUSCULAR | Status: DC | PRN

## 2021-01-14 MED ORDER — KETOROLAC TROMETHAMINE 30 MG/ML IJ SOLN
INTRAMUSCULAR | Status: AC
  Filled 2021-01-14: qty 1

## 2021-01-14 MED ORDER — ENOXAPARIN SODIUM 40 MG/0.4ML ~~LOC~~ SOLN
40.0000 mg | SUBCUTANEOUS | Status: DC
  Administered 2021-01-15 – 2021-01-17 (×3): 40 mg via SUBCUTANEOUS
  Filled 2021-01-14 (×3): qty 0.4

## 2021-01-14 MED ORDER — DOCUSATE SODIUM 100 MG PO CAPS
100.0000 mg | ORAL_CAPSULE | Freq: Two times a day (BID) | ORAL | Status: DC
  Administered 2021-01-14 – 2021-01-16 (×2): 100 mg via ORAL
  Filled 2021-01-14 (×3): qty 1

## 2021-01-14 MED ORDER — ONDANSETRON HCL 4 MG/2ML IJ SOLN
INTRAMUSCULAR | Status: DC | PRN
  Administered 2021-01-14: 4 mg via INTRAVENOUS

## 2021-01-14 MED ORDER — DEXAMETHASONE SODIUM PHOSPHATE 10 MG/ML IJ SOLN
INTRAMUSCULAR | Status: DC | PRN
  Administered 2021-01-14: 10 mg via INTRAVENOUS

## 2021-01-14 MED ORDER — PHENYLEPHRINE 40 MCG/ML (10ML) SYRINGE FOR IV PUSH (FOR BLOOD PRESSURE SUPPORT)
PREFILLED_SYRINGE | INTRAVENOUS | Status: AC
  Filled 2021-01-14: qty 10

## 2021-01-14 MED ORDER — METOCLOPRAMIDE HCL 5 MG/ML IJ SOLN: 5.0000 mg | Freq: Three times a day (TID) | INTRAMUSCULAR | Status: DC | PRN

## 2021-01-14 MED ORDER — LACTATED RINGERS IV SOLN: INTRAVENOUS | Status: DC | PRN

## 2021-01-14 MED ORDER — 0.9 % SODIUM CHLORIDE (POUR BTL) OPTIME
TOPICAL | Status: DC | PRN
  Administered 2021-01-14: 1000 mL

## 2021-01-14 MED ORDER — CEFAZOLIN SODIUM-DEXTROSE 2-4 GM/100ML-% IV SOLN
2.0000 g | Freq: Four times a day (QID) | INTRAVENOUS | Status: AC
  Administered 2021-01-14 – 2021-01-15 (×2): 2 g via INTRAVENOUS
  Filled 2021-01-14 (×2): qty 100

## 2021-01-14 MED ORDER — FENTANYL CITRATE (PF) 100 MCG/2ML IJ SOLN
25.0000 ug | INTRAMUSCULAR | Status: DC | PRN
  Administered 2021-01-14: 25 ug via INTRAVENOUS

## 2021-01-14 MED ORDER — BUPIVACAINE HCL (PF) 0.5 % IJ SOLN
INTRAMUSCULAR | Status: AC
  Filled 2021-01-14: qty 30

## 2021-01-14 MED ORDER — PHENYLEPHRINE 40 MCG/ML (10ML) SYRINGE FOR IV PUSH (FOR BLOOD PRESSURE SUPPORT)
PREFILLED_SYRINGE | INTRAVENOUS | Status: DC | PRN
  Administered 2021-01-14: 80 ug via INTRAVENOUS
  Administered 2021-01-14: 120 ug via INTRAVENOUS
  Administered 2021-01-14: 80 ug via INTRAVENOUS
  Administered 2021-01-14: 120 ug via INTRAVENOUS

## 2021-01-14 MED ORDER — FENTANYL CITRATE (PF) 100 MCG/2ML IJ SOLN
INTRAMUSCULAR | Status: AC
  Administered 2021-01-14: 25 ug via INTRAVENOUS
  Filled 2021-01-14: qty 2

## 2021-01-14 MED ORDER — EPINEPHRINE PF 1 MG/ML IJ SOLN
INTRAMUSCULAR | Status: DC | PRN
  Administered 2021-01-14: .15 mL

## 2021-01-14 MED ORDER — DEXAMETHASONE SODIUM PHOSPHATE 10 MG/ML IJ SOLN
INTRAMUSCULAR | Status: AC
  Filled 2021-01-14: qty 1

## 2021-01-14 MED ORDER — EPINEPHRINE PF 1 MG/ML IJ SOLN
INTRAMUSCULAR | Status: AC
  Filled 2021-01-14: qty 1

## 2021-01-14 MED ORDER — IRRISEPT - 450ML BOTTLE WITH 0.05% CHG IN STERILE WATER, USP 99.95% OPTIME
TOPICAL | Status: DC | PRN
  Administered 2021-01-14: 450 mL

## 2021-01-14 MED ORDER — SUGAMMADEX SODIUM 200 MG/2ML IV SOLN
INTRAVENOUS | Status: DC | PRN
  Administered 2021-01-14: 125 mg via INTRAVENOUS

## 2021-01-14 MED ORDER — CEFAZOLIN SODIUM-DEXTROSE 2-3 GM-%(50ML) IV SOLR
INTRAVENOUS | Status: DC | PRN
  Administered 2021-01-14: 2 g via INTRAVENOUS

## 2021-01-14 MED ORDER — ROCURONIUM BROMIDE 10 MG/ML (PF) SYRINGE
PREFILLED_SYRINGE | INTRAVENOUS | Status: DC | PRN
  Administered 2021-01-14: 50 mg via INTRAVENOUS

## 2021-01-14 MED ORDER — SODIUM CHLORIDE 0.9 % IR SOLN
Status: DC | PRN
  Administered 2021-01-14: 3000 mL

## 2021-01-14 MED ORDER — PHENOL 1.4 % MT LIQD: 1.0000 | OROMUCOSAL | Status: DC | PRN

## 2021-01-14 MED ORDER — PROPOFOL 10 MG/ML IV BOLUS
INTRAVENOUS | Status: DC | PRN
  Administered 2021-01-14: 50 mg via INTRAVENOUS

## 2021-01-14 SURGICAL SUPPLY — 89 items
ADH SKN CLS APL DERMABOND .7 (GAUZE/BANDAGES/DRESSINGS) ×1
ALCOHOL 70% 16 OZ (MISCELLANEOUS) ×2 IMPLANT
APL PRP STRL LF DISP 70% ISPRP (MISCELLANEOUS) ×2
AXLE ORTHOPEDIC SALVAGE SYSTEM (Knees) ×1 IMPLANT
AXLE TIB LFRIC INTFC KN OSS (Knees) ×1 IMPLANT
BANDAGE ESMARK 6X9 LF (GAUZE/BANDAGES/DRESSINGS) ×1 IMPLANT
BASEPLATE TIBIAL 71X160 HIP (Plate) ×1 IMPLANT
BEARING TIBIAL OSS ARCOM 12MM (Knees) IMPLANT
BIT DRILL QUICK REL 1/8 2PK SL (DRILL) IMPLANT
BLADE SAW RECIP 87.9 MT (BLADE) ×2 IMPLANT
BLADE SAW SGTL 81X20 HD (BLADE) ×1 IMPLANT
BNDG CMPR 9X6 STRL LF SNTH (GAUZE/BANDAGES/DRESSINGS) ×1
BNDG ELASTIC 4X5.8 VLCR STR LF (GAUZE/BANDAGES/DRESSINGS) ×1 IMPLANT
BNDG ELASTIC 6X5.8 VLCR STR LF (GAUZE/BANDAGES/DRESSINGS) ×2 IMPLANT
BNDG ESMARK 6X9 LF (GAUZE/BANDAGES/DRESSINGS) ×2
BRNG TIB STD 12 STRL KN (Knees) ×1 IMPLANT
BRUSH FEMORAL CANAL (MISCELLANEOUS) ×1 IMPLANT
BSPLAT TIB LNG 71 NMOD STRL (Plate) ×1 IMPLANT
BUSHING TIBIAL OSS ARCOM (Knees) IMPLANT
CEMENT BONE REFOBACIN R1X40 US (Cement) ×5 IMPLANT
CEMENT RESTRICTOR DEPUY SZ 3 (Cement) ×1 IMPLANT
CHLORAPREP W/TINT 26 (MISCELLANEOUS) ×4 IMPLANT
CUFF TOURN SGL QUICK 34 (TOURNIQUET CUFF) ×2
CUFF TRNQT CYL 34X4.125X (TOURNIQUET CUFF) ×1 IMPLANT
DERMABOND ADVANCED (GAUZE/BANDAGES/DRESSINGS) ×1
DERMABOND ADVANCED .7 DNX12 (GAUZE/BANDAGES/DRESSINGS) ×1 IMPLANT
DRAIN HEMOVAC 7FR (DRAIN) ×2 IMPLANT
DRAPE C-ARM 42X72 X-RAY (DRAPES) ×1 IMPLANT
DRAPE HALF SHEET 40X57 (DRAPES) ×2 IMPLANT
DRAPE POUCH INSTRU U-SHP 10X18 (DRAPES) ×2 IMPLANT
DRAPE U-SHAPE 47X51 STRL (DRAPES) ×2 IMPLANT
DRILL QUICK RELEASE 1/8 INCH (DRILL) ×4
DRSG AQUACEL AG ADV 3.5X14 (GAUZE/BANDAGES/DRESSINGS) ×2 IMPLANT
DRSG TEGADERM 2-3/8X2-3/4 SM (GAUZE/BANDAGES/DRESSINGS) ×2 IMPLANT
ELECT REM PT RETURN 9FT ADLT (ELECTROSURGICAL) ×2
ELECTRODE REM PT RTRN 9FT ADLT (ELECTROSURGICAL) ×1 IMPLANT
EVACUATOR 1/8 PVC DRAIN (DRAIN) IMPLANT
FEMORAL BUSHING OSS ARCOM SET (Knees) ×1 IMPLANT
GAUZE SPONGE 4X4 12PLY STRL (GAUZE/BANDAGES/DRESSINGS) ×1 IMPLANT
GLOVE BIO SURGEON STRL SZ8.5 (GLOVE) ×7 IMPLANT
GLOVE BIOGEL M 7.0 STRL (GLOVE) ×7 IMPLANT
GLOVE BIOGEL PI IND STRL 7.5 (GLOVE) ×1 IMPLANT
GLOVE BIOGEL PI IND STRL 8.5 (GLOVE) ×1 IMPLANT
GLOVE BIOGEL PI INDICATOR 7.5 (GLOVE) ×2
GLOVE BIOGEL PI INDICATOR 8.5 (GLOVE) ×2
GLOVE SURG POLYISO LF SZ8 (GLOVE) ×2 IMPLANT
GOWN STRL REUS W/ TWL XL LVL3 (GOWN DISPOSABLE) ×1 IMPLANT
GOWN STRL REUS W/TWL 2XL LVL3 (GOWN DISPOSABLE) ×4 IMPLANT
GOWN STRL REUS W/TWL XL LVL3 (GOWN DISPOSABLE) ×2
GUIDEWIRE BALL NOSE 80CM (WIRE) ×1 IMPLANT
HANDPIECE INTERPULSE COAX TIP (DISPOSABLE) ×2
HOOD PEEL AWAY FLYTE STAYCOOL (MISCELLANEOUS) ×2 IMPLANT
KIT BASIN OR (CUSTOM PROCEDURE TRAY) ×2 IMPLANT
MANIFOLD NEPTUNE II (INSTRUMENTS) ×2 IMPLANT
NDL SPNL 18GX3.5 QUINCKE PK (NEEDLE) IMPLANT
NEEDLE SPNL 18GX3.5 QUINCKE PK (NEEDLE) ×2 IMPLANT
PACK TOTAL JOINT (CUSTOM PROCEDURE TRAY) ×2 IMPLANT
PADDING CAST COTTON 6X4 STRL (CAST SUPPLIES) ×3 IMPLANT
PIN LOCKING OSS ARCOM (Knees) ×1 IMPLANT
PRESSURIZER FEMORAL UNIV (MISCELLANEOUS) ×1 IMPLANT
RESTRICTOR CEMENT PE SZ 2 (Cement) ×1 IMPLANT
SAW OSC TIP CART 19.5X105X1.3 (SAW) ×2 IMPLANT
SEALER BIPOLAR AQUA 6.0 (INSTRUMENTS) ×2 IMPLANT
SEGMENT FEM KNEE STD 7 LT (Joint) ×1 IMPLANT
SET HNDPC FAN SPRY TIP SCT (DISPOSABLE) ×1 IMPLANT
SET PAD KNEE POSITIONER (MISCELLANEOUS) ×2 IMPLANT
STAPLER VISISTAT 35W (STAPLE) ×2 IMPLANT
STEM OSS STRT SHLD 9X150 (Screw) ×1 IMPLANT
SUCTION FRAZIER HANDLE 10FR (MISCELLANEOUS) ×2
SUCTION TUBE FRAZIER 10FR DISP (MISCELLANEOUS) ×1 IMPLANT
SUT MNCRL AB 3-0 PS2 27 (SUTURE) ×2 IMPLANT
SUT MON AB 2-0 CT1 36 (SUTURE) ×4 IMPLANT
SUT VIC AB 1 CT1 27 (SUTURE) ×2
SUT VIC AB 1 CT1 27XBRD ANBCTR (SUTURE) IMPLANT
SUT VIC AB 1 CTX 27 (SUTURE) ×2 IMPLANT
SUT VIC AB 2-0 CT1 27 (SUTURE) ×2
SUT VIC AB 2-0 CT1 TAPERPNT 27 (SUTURE) ×1 IMPLANT
SUT VLOC 180 0 24IN GS25 (SUTURE) ×2 IMPLANT
SYR 50ML LL SCALE MARK (SYRINGE) ×3 IMPLANT
SYSTEM YOKE ORTHOPEDIC SALVAGE (Knees) IMPLANT
TIBIAL BEARING OSS ARCOM 12MM (Knees) ×2 IMPLANT
TIBIAL BUSHING OSS ARCOM (Knees) ×2 IMPLANT
TOWEL GREEN STERILE (TOWEL DISPOSABLE) ×2 IMPLANT
TOWEL OR NON WOVEN STRL DISP B (DISPOSABLE) IMPLANT
TOWER CARTRIDGE SMART MIX (DISPOSABLE) ×3 IMPLANT
TRAY FOLEY MTR SLVR 16FR STAT (SET/KITS/TRAYS/PACK) ×1 IMPLANT
WIPE CATHETER CARE KIT (MISCELLANEOUS) ×2 IMPLANT
WRAP KNEE MAXI GEL POST OP (GAUZE/BANDAGES/DRESSINGS) ×2 IMPLANT
YOKE ORTHOPEDIC SALVAGE SYSTEM (Knees) ×2 IMPLANT

## 2021-01-14 NOTE — Anesthesia Postprocedure Evaluation (Signed)
Anesthesia Post Note  Patient: Kabrea Seeney Zuelke  Procedure(s) Performed: LEFT DISTAL FEMUR REPLACEMENT (Left Knee)     Patient location during evaluation: PACU Anesthesia Type: General Level of consciousness: awake and alert Pain management: pain level controlled Vital Signs Assessment: post-procedure vital signs reviewed and stable Respiratory status: spontaneous breathing, nonlabored ventilation, respiratory function stable and patient connected to nasal cannula oxygen Cardiovascular status: blood pressure returned to baseline and stable Postop Assessment: no apparent nausea or vomiting Anesthetic complications: no   No complications documented.  Last Vitals:  Vitals:   01/14/21 1250 01/14/21 1312  BP: 123/82 100/65  Pulse: 93 97  Resp: 17 18  Temp: 36.7 C 36.9 C  SpO2: 100% 100%    Last Pain:  Vitals:   01/14/21 1312  TempSrc: Oral  PainSc:                  Effie Berkshire

## 2021-01-14 NOTE — Transfer of Care (Signed)
Immediate Anesthesia Transfer of Care Note  Patient: [name]  Procedure(s) Performed: LEFT DISTAL FEMUR REPLACEMENT (Left Knee)  Patient Location: PACU  Anesthesia Type:General  Level of Consciousness: awake and alert   Airway & Oxygen Therapy: Patient Spontanous Breathing and Patient connected to face mask oxygen  Post-op Assessment: Report given to RN and Post -op Vital signs reviewed and stable  Post vital signs: Reviewed and stable  Last Vitals:  Vitals Value Taken Time  BP 125/85 01/14/21 1213  Temp    Pulse 111 01/14/21 1216  Resp 25 01/14/21 1216  SpO2 98 % 01/14/21 1216  Vitals shown include unvalidated device data.  Last Pain:  Vitals:   01/13/21 1929  TempSrc:   PainSc: 0-No pain      Patients Stated Pain Goal: 0 (57/84/69 6295)  Complications: No complications documented.

## 2021-01-14 NOTE — OR Nursing (Signed)
PATIENT INCONTINENT MODERATE AMOUNT SOFT, LIGHT BROWN STOOL.PERINEAL HYGIENE GIVEN WITH SURE STEP WIPES X 2 PACKS WITH ASSISTANCE OF SURGEON.

## 2021-01-14 NOTE — Anesthesia Procedure Notes (Signed)
Procedure Name: Intubation Date/Time: 01/14/2021 8:29 AM Performed by: Candis Shine, CRNA Pre-anesthesia Checklist: Patient identified, Emergency Drugs available, Suction available and Patient being monitored Patient Re-evaluated:Patient Re-evaluated prior to induction Oxygen Delivery Method: Circle System Utilized Preoxygenation: Pre-oxygenation with 100% oxygen Induction Type: IV induction Ventilation: Mask ventilation without difficulty and Oral airway inserted - appropriate to patient size Laryngoscope Size: Mac and 3 Grade View: Grade I Tube type: Oral Tube size: 7.0 mm Number of attempts: 1 Airway Equipment and Method: Stylet and Oral airway Placement Confirmation: ETT inserted through vocal cords under direct vision,  positive ETCO2 and breath sounds checked- equal and bilateral Secured at: 21 cm Tube secured with: Tape Dental Injury: Teeth and Oropharynx as per pre-operative assessment

## 2021-01-14 NOTE — Interval H&P Note (Signed)
History and Physical Interval Note:  01/14/2021 7:38 AM  [name]  has presented today for surgery, with the diagnosis of PERIPROSTHETIC LEFT FEMUR FRACTURE.  The various methods of treatment have been discussed with the patient and family. After consideration of risks, benefits and other options for treatment, the patient has consented to  Procedure(s): LEFT DISTAL FEMUR REPLACEMENT (Left) as a surgical intervention.  The patient's history has been reviewed, patient examined, no change in status, stable for surgery.  I have reviewed the patient's chart and labs.  Questions were answered to the patient's satisfaction.     The risks, benefits, and alternatives were discussed with the patient. There are risks associated with the surgery including, but not limited to, problems with anesthesia (death), infection, instability (giving out of the joint), dislocation, differences in leg length/angulation/rotation, fracture of bones, loosening or failure of implants, hematoma (blood accumulation) which may require surgical drainage, blood clots, pulmonary embolism, nerve injury (foot drop and lateral thigh numbness), and blood vessel injury. The patient understands these risks and elects to proceed.    Hilton Cork Jacqulynn Shappell

## 2021-01-14 NOTE — Discharge Instructions (Signed)
° °Dr. Dustine Bertini °Total Joint Specialist °[facility] °[address] °[place], [place] [address] °[phone] ° °TOTAL KNEE REPLACEMENT POSTOPERATIVE DIRECTIONS ° ° ° °Knee Rehabilitation, Guidelines Following Surgery  °Results after knee surgery are often greatly improved when you follow the exercise, range of motion and muscle strengthening exercises prescribed by your doctor. Safety measures are also important to protect the knee from further injury. Any time any of these exercises cause you to have increased pain or swelling in your knee joint, decrease the amount until you are comfortable again and slowly increase them. If you have problems or questions, call your caregiver or physical therapist for advice.  ° °WEIGHT BEARING °Weight bearing as tolerated with assist device (walker, cane, etc) as directed, use it as long as suggested by your surgeon or therapist, typically at least 4-6 weeks. ° °HOME CARE INSTRUCTIONS  °Remove items at home which could result in a fall. This includes throw rugs or furniture in walking pathways.  °Continue medications as instructed at time of discharge. °You may have some home medications which will be placed on hold until you complete the course of blood thinner medication.  °You may start showering once you are discharged home but do not submerge the incision under water. Just pat the incision dry and apply a dry gauze dressing on daily. °Walk with walker as instructed.  °You may resume a sexual relationship in one month or when given the OK by your doctor.  °· Use walker as long as suggested by your caregivers. °· Avoid periods of inactivity such as sitting longer than an hour when not asleep. This helps prevent blood clots.  °You may put full weight on your legs and walk as much as is comfortable.  °You may return to work once you are cleared by your doctor.  °Do not drive a car for 6 weeks or until released by you surgeon.  °· Do not drive  while taking narcotics.  °Wear the elastic stockings for three weeks following surgery during the day but you may remove then at night. °Make sure you keep all of your appointments after your operation with all of your doctors and caregivers. You should call the office at the above phone number and make an appointment for approximately two weeks after the date of your surgery. °Do not remove your surgical dressing. The dressing is waterproof; you may take showers in 3 days, but do not take tub baths or submerge the dressing. °Please pick up a stool softener and laxative for home use as long as you are requiring pain medications. °· ICE to the affected knee every three hours for 30 minutes at a time and then as needed for pain and swelling.  Continue to use ice on the knee for pain and swelling from surgery. You may notice swelling that will progress down to the foot and ankle.  This is normal after surgery.  Elevate the leg when you are not up walking on it.   °It is important for you to complete the blood thinner medication as prescribed by your doctor. °· Continue to use the breathing machine which will help keep your temperature down.  It is common for your temperature to cycle up and down following surgery, especially at night when you are not up moving around and exerting yourself.  The breathing machine keeps your lungs expanded and your temperature down. ° °RANGE OF MOTION AND STRENGTHENING EXERCISES  °Rehabilitation of the knee is important following   a knee injury or an operation. After just a few days of immobilization, the muscles of the thigh which control the knee become weakened and shrink (atrophy). Knee exercises are designed to build up the tone and strength of the thigh muscles and to improve knee motion. Often times heat used for twenty to thirty minutes before working out will loosen up your tissues and help with improving the range of motion but do not use heat for the first two weeks following  surgery. These exercises can be done on a training (exercise) mat, on the floor, on a table or on a bed. Use what ever works the best and is most comfortable for you Knee exercises include:  °Leg Lifts - While your knee is still immobilized in a splint or cast, you can do straight leg raises. Lift the leg to 60 degrees, hold for 3 sec, and slowly lower the leg. Repeat 10-20 times 2-3 times daily. Perform this exercise against resistance later as your knee gets better.  °Quad and Hamstring Sets - Tighten up the muscle on the front of the thigh (Quad) and hold for 5-10 sec. Repeat this 10-20 times hourly. Hamstring sets are done by pushing the foot backward against an object and holding for 5-10 sec. Repeat as with quad sets.  °A rehabilitation program following serious knee injuries can speed recovery and prevent re-injury in the future due to weakened muscles. Contact your doctor or a physical therapist for more information on knee rehabilitation.  ° °SKILLED REHAB INSTRUCTIONS: °If the patient is transferred to a skilled rehab facility following release from the hospital, a list of the current medications will be sent to the facility for the patient to continue.  When discharged from the skilled rehab facility, please have the facility set up the patient's Home Health Physical Therapy prior to being released. Also, the skilled facility will be responsible for providing the patient with their medications at time of release from the facility to include their pain medication, the muscle relaxants, and their blood thinner medication. If the patient is still at the rehab facility at time of the two week follow up appointment, the skilled rehab facility will also need to assist the patient in arranging follow up appointment in our office and any transportation needs. ° °MAKE SURE YOU:  °Understand these instructions.  °Will watch your condition.  °Will get help right away if you are not doing well or get worse.   ° ° °Pick up stool softner and laxative for home use following surgery while on pain medications. °Do NOT remove your dressing. You may shower.  °Do not take tub baths or submerge incision under water. °May shower starting three days after surgery. °Please use a clean towel to pat the incision dry following showers. °Continue to use ice for pain and swelling after surgery. °Do not use any lotions or creams on the incision until instructed by your surgeon. ° °

## 2021-01-14 NOTE — Progress Notes (Signed)
[facility] Progress Note  Patient: [name]    SFK:[id number]  DOA: 01/08/2021     Date of Service: the patient was seen and examined on 01/14/2021  Brief hospital course: Past medical history of anxiety, hypothyroidism, PVD.  Presents with complaints of leg pain after a fall.  Found to have acute impacted fracture of left distal femur.  Orthopedic consulted.  Currently scheduled for surgery on Sunday.  Also have anemia and B12 deficiency.   Assessment and Plan:  Periprosthetic distal femur fracture Orthopedic consult appreciated Fracture occurred after mechanical fall. Initially admitted to [facility] transfer to [facility] for surgery Pain control per orthopedic. Low to moderate risk preoperatively for cardiac events only because of her old age. Per Lyndel Safe score: 1.9 % Risk of myocardial infarction or cardiac arrest, intraoperatively or up to 30 days post-op.  Chest x-ray and EKG unremarkable. -going to the OR this AM   Hypothyroidism TSH significantly low. Dose decreased from 100 to 88 MCG.  Essential hypertension Continue metoprolol.   PVD Patient on aspirin and cilostazol.  Currently on hold in the setting of anemia.   Iron deficiency as well as B12 deficiency anemia. Hemoglobin significantly low on 2/23.  On recheck hemoglobin stable. Going down throughout this hospitalization likely in the setting of dilution. Iron level significantly low.  B12 significantly low.  Will replace parenterally for now.  Goal hemoglobin more than 10 per surgery. -Hgb 12 today   AKI Mild. Monitor.  Hypokalemia -repleted  Hyponatremia -resolved   Due to poor dentition family is providing patient's pured diet at home - dysphagia 2 diet.  Also add Ensure.  Suspected Dementia -delirium precautions -B12 replacement    DVT Prophylaxis:   SCDs Start: 01/08/21 0310   Advance goals of care discussion: Full code  Family Communication: no family was  present at bedside-- called daughters to update .  Disposition:  Status is: Inpatient  Remains inpatient appropriate because:Ongoing diagnostic testing needed not appropriate for outpatient work up  Dispo: The patient is from: Home              Anticipated d/c is to: SNF              Anticipated d/c date is: 3 days              Patient currently is not medically stable to d/c.     Subjective: on the way to the OR  Physical Exam:   General: Appearance:    elderly female in no acute distress     Lungs:     respirations unlabored              Vitals:   01/13/21 1300 01/13/21 1929 01/14/21 0500 01/14/21 0507  BP: 136/60 (!) 148/87  (!) 148/88  Pulse: 80 (!) 107  78  Resp: 18 17  18   Temp: 98 F (36.7 C) 99 F (37.2 C)  98.2 F (36.8 C)  TempSrc: Axillary     SpO2: 93% 93%  98%  Weight:   58.9 kg   Height:        Intake/Output Summary (Last 24 hours) at 01/14/2021 1214 Last data filed at 01/14/2021 1145 Gross per 24 hour  Intake 1300 ml  Output 1225 ml  Net 75 ml   Filed Weights   01/08/21 2336 01/13/21 0500 01/14/21 0500  Weight: 65.4 kg 58.8 kg 58.9 kg     CBC: Recent Labs  Lab 01/08/21 0811 01/10/21 0305  01/10/21 1036 01/11/21 0753 01/12/21 0047 01/13/21 0207 01/14/21 0113  WBC 10.3   < > 7.4 9.2 9.1 10.8* 11.7*  NEUTROABS 9.0*  --  5.7  --   --   --   --   HGB 9.7*   < > 7.9* 8.4* 11.4* 12.3 12.2  HCT 29.4*   < > 24.2* 25.2* 32.8* 36.9 35.9*  MCV 88.3   < > 87.7 86.9 87.7 88.9 90.9  PLT 244   < > 224 242 228 296 298   < > = values in this interval not displayed.   Basic Metabolic Panel: Recent Labs  Lab 01/08/21 0811 01/10/21 0305 01/12/21 0047 01/13/21 0207 01/14/21 0113  NA 133* 131* 134* 135 139  K 4.0 3.8 3.6 3.0* 4.0  CL 95* 95* 97* 97* 104  CO2 26 27 25 28  21*  GLUCOSE 133* 98 76 110* 87  BUN 17 12 11 18 14   CREATININE 1.01* 0.93 0.77 1.01* 0.88  CALCIUM 9.3 8.7* 9.2 9.4 9.4  MG 2.0  --  2.1 2.2  --     Studies: No  results found.  Scheduled Meds: . [MAR Hold] cyanocobalamin  1,000 mcg Subcutaneous Q1200   Followed by  . [MAR Hold] vitamin B-12  1,000 mcg Oral Daily  . [MAR Hold] feeding supplement  237 mL Oral TID BM  . [MAR Hold] levothyroxine  88 mcg Oral QAC breakfast  . [MAR Hold] metoprolol tartrate  100 mg Oral BID  . [MAR Hold] pantoprazole  40 mg Oral Daily   Continuous Infusions:  PRN Meds: 0.9 % irrigation (POUR BTL), [MAR Hold] acetaminophen **OR** [MAR Hold] acetaminophen, bupivacaine, EPINEPHrine, fentaNYL (SUBLIMAZE) injection, [MAR Hold] haloperidol lactate, [MAR Hold] HYDROcodone-acetaminophen, IRRISEPT - 0.05% chlorhexedine in sterile water for irrigation, ketorolac, [MAR Hold] LORazepam, [MAR Hold]  morphine injection, [MAR Hold] ondansetron **OR** [MAR Hold] ondansetron (ZOFRAN) IV, [MAR Hold] oxyCODONE, sodium chloride (PF), sodium chloride irrigation, sodium chloride irrigation  Time spent: 25 minutes  Author: Eulogio Bear DO [facility] 01/14/2021 12:14 PM  To reach On-call, see care teams to locate the attending and reach out via [email or web address]. Between 7PM-7AM, please contact night-coverage If you still have difficulty reaching the attending provider, please page the [facility] (Director on Call) for [facility] on amion for assistance.

## 2021-01-14 NOTE — Op Note (Signed)
01/14/2021  12:15 PM  PATIENT:  [name]    PRE-OPERATIVE DIAGNOSIS: Comminuted periprosthetic left distal femur fracture.  POST-OPERATIVE DIAGNOSIS:  Same.  PROCEDURE: Revision left total knee arthroplasty, femoral and tibial components, conversion to hinged distal femur replacement.  SURGEON:  Bertram Savin, MD  PHYSICIAN ASSISTANT: Cherlynn June, PA-C, present and scrubbed throughout the case, critical for completion in a timely fashion, and for retraction, instrumentation, and closure.  ANESTHESIA:   General  ESTIMATED BLOOD LOSS: 100 cc.  TUBES AND DRAINS: Medium Hemovac x1.  SPECIMENS: None.  EXPLANTS: Stryker Scorpio CR femur, tibia, and polyinsert.  IMPLANTS: [employer] orthopedics salvage system segmented elliptical femur size 7 cm with 9 x 150 mm straight IM stem. Orthopedic salvage system nonmodular long tibial baseplate, size 71 mm tray with 160 x 10 mm stem. 12 mm ArCom tibial bearing with yolk, axial, femoral bushings, tibial bearing, and locking pin.  TOURNIQUET TIME: 118 minutes at 300 mmHg.  COMPLICATIONS: None.  DISPOSITION: Stable to PACU.  PREOPERATIVE INDICATIONS:  [name] is a  87 y.o. female with a history of previous cemented left total knee arthroplasty.  She had a ground-level fall with left knee pain and inability to weight-bear.  She was brought to the emergency department at [facility], where x-rays and CT scan of the left knee revealed a severely comminuted, nonreconstructable periprosthetic distal femur fracture.  She was therefore indicated for revision left total knee arthroplasty to hinged distal femur replacement.  The risks, benefits, and alternatives were discussed with the patient. There are risks associated with the surgery including, but not limited to, problems with anesthesia (death), infection, instability (giving out of the joint), dislocation, differences in leg length/angulation/rotation, fracture of bones,  loosening or failure of implants, hematoma (blood accumulation) which may require surgical drainage, blood clots, pulmonary embolism, nerve injury (foot drop and lateral thigh numbness), and blood vessel injury. The patient understands these risks and elects to proceed.  OPERATIVE PROCEDURE: The patient was identified in the preop holding area using 2 identifiers.  The surgical site was marked by myself.  She was taken to the operating room, and general anesthesia was induced on her bed.  A Foley catheter was placed.  She was then transferred to the operating table in supine position.  All bony prominences were well-padded.  A bump was placed under the left hip.  Nonsterile tourniquet was applied to the left upper thigh.  The left lower extremity was prepped and draped in the normal sterile surgical fashion.  Timeout was called, verifying site and site of surgery.  Preop antibiotics were given within 60 minutes of beginning the procedure.  Esmarch exsanguination was utilized, and the tourniquet was elevated to 300 mmHg.  Her previous anterior incision was sharply excised with a #10 blade.  Full-thickness skin flaps were created.  A standard medial peripatellar arthrotomy was made with Bovie electrocautery.  I turned my attention to the distal femur.  Retractors were placed medially and laterally.  The fracture was identified.  There was a severely comminuted fracture of the distal femur distal to the anterior flange.  The medial and lateral epicondyles were entirely blown out from the femur.  The fracture is not reconstructable.  Using a bone hook, the femoral component was retracted anteriorly and distally.  Careful subperiosteal dissection was performed with Bovie electrocautery to remove the distal femur.  Comminuted fracture fragments were carefully removed from the deep wound bed.  The medial and lateral metaphyseal bone  containing the epicondyles was carefully dissected subperiosteally with Bovie  electrocautery.  I then turned my attention to the tibial component.  The tibial tray was grossly loose from the cement mantle and I was able to remove it by hand.  Cement was carefully removed from the proximal tibia using osteotomes.  Bone quality of the proximal tibia was extremely compromised.  While removing cement from the proximal tibia, an anteromedial perforation was noted.  Cement was removed from the canal with reverse curettes.  I then brought in fluoroscopy and sequentially reamed up to a 12 mm reamer with excellent chatter.  Intramedullary reamer was visualized using AP and lateral fluoroscopy views.  Proximal tibial cleanup cut was performed using intramedullary alignment, taking a small amount of bone.  I then sequentially reamed up on the femur to 13 mm with excellent chatter.  Proximal chamfer reamer was used followed by planar.  Trial tibial and femoral implants were assembled on the back table and inserted into the bone.  Trial polyliner was placed.  I rechecked AP fluoroscopy views to confirm hardware placement.  I was able to bypass the perforation of the proximal tibia without any difficulty.  The trial components were removed.  The real implants were opened and assembled on the back table.  The intramedullary canals were cleaned with pulsatile lavage and dried with lap sponges.  Cement plugs were placed in the femur and tibia.  Cement was mixed on the back table and pressurized into the tibial and femoral canals.  The real tibial and femoral components were inserted, taking care to maintain appropriate external rotation.  Final trialing with a 12 mm bearing was performed confirming adequate soft tissue tension.  The appropriate bushings were inserted and the real 12 mm bearing was placed.  The axle was placed followed by the locking pin.  There is excellent range of motion and stability.  The patella tracked centrally with no thumbs technique.  The patellar component was inspected and found  to be well fixed.  The tourniquet was then let down, and meticulous hemostasis was achieved with aqua mantis and Bovie electrocautery.  The arthrotomy was closed over a medium Hemovac drain using #1 Vicryl and #0 V lock.  Deep dermal layer was closed with 2-0 interrupted Monocryl sutures.  Skin was reapproximated with staples.  Dermabond was applied.  Once the glue was fully dried, an Aquacel dressing was applied.  Bulky sterile dressing with cast padding and Ace wrap was applied.  Pulse examination of the lower extremity revealed intact palpable pedal pulses, symmetric to preop.  She was then awakened from anesthesia and taken to the PACU in stable condition.  Sponge, needle, and instrument counts were correct at the end of the case x2.  There were no known complications.  POSTOPERATIVE PLAN: The patient will be readmitted to the hospitalist service.  She may weight-bear as tolerated with a walker.  Mobilize out of bed with physical therapy and Occupational Therapy.  Tomorrow morning, begin Lovenox for DVT prophylaxis.  We will discontinue the Hemovac drain when the output is less than 30 cc per shift.  She will undergo disposition planning.  Return to the office in 2 weeks for routine postoperative care.

## 2021-01-15 DIAGNOSIS — S72401A Unspecified fracture of lower end of right femur, initial encounter for closed fracture: Secondary | ICD-10-CM | POA: Diagnosis not present

## 2021-01-15 DIAGNOSIS — E039 Hypothyroidism, unspecified: Secondary | ICD-10-CM | POA: Diagnosis not present

## 2021-01-15 LAB — TYPE AND SCREEN
ABO/RH(D): O NEG
Antibody Screen: NEGATIVE
Unit division: 0
Unit division: 0
Unit division: 0
Unit division: 0

## 2021-01-15 LAB — LACTIC ACID, PLASMA
Lactic Acid, Venous: 1.6 mmol/L (ref 0.5–1.9)
Lactic Acid, Venous: 1.3 mmol/L (ref 0.5–1.9)

## 2021-01-15 LAB — CBC
HCT: 32 % — ABNORMAL LOW (ref 36.0–46.0)
Hemoglobin: 10.6 g/dL — ABNORMAL LOW (ref 12.0–15.0)
MCH: 31.2 pg (ref 26.0–34.0)
MCHC: 33.1 g/dL (ref 30.0–36.0)
MCV: 94.1 fL (ref 80.0–100.0)
Platelets: 282 10*3/uL (ref 150–400)
RBC: 3.4 MIL/uL — ABNORMAL LOW (ref 3.87–5.11)
RDW: 15.7 % — ABNORMAL HIGH (ref 11.5–15.5)
WBC: 15.6 10*3/uL — ABNORMAL HIGH (ref 4.0–10.5)
nRBC: 0.1 % (ref 0.0–0.2)

## 2021-01-15 LAB — BASIC METABOLIC PANEL
Anion gap: 15 (ref 5–15)
BUN: 34 mg/dL — ABNORMAL HIGH (ref 8–23)
CO2: 21 mmol/L — ABNORMAL LOW (ref 22–32)
Calcium: 8.6 mg/dL — ABNORMAL LOW (ref 8.9–10.3)
Chloride: 103 mmol/L (ref 98–111)
Creatinine, Ser: 2.01 mg/dL — ABNORMAL HIGH (ref 0.44–1.00)
GFR, Estimated: 23 mL/min — ABNORMAL LOW (ref >60)
Glucose, Bld: 134 mg/dL — ABNORMAL HIGH (ref 70–99)
Potassium: 4.6 mmol/L (ref 3.5–5.1)
Sodium: 139 mmol/L (ref 135–145)

## 2021-01-15 LAB — BPAM RBC
Blood Product Expiration Date: 202203232359
Blood Product Expiration Date: 202203232359
Blood Product Expiration Date: 202203232359
Blood Product Expiration Date: 202203232359
ISSUE DATE / TIME: 202202241147
ISSUE DATE / TIME: 202202241601
Unit Type and Rh: 5100
Unit Type and Rh: 5100
Unit Type and Rh: 5100
Unit Type and Rh: 5100

## 2021-01-15 MED ORDER — SODIUM CHLORIDE 0.9 % IV BOLUS
1000.0000 mL | Freq: Once | INTRAVENOUS | Status: AC
  Administered 2021-01-15: 1000 mL via INTRAVENOUS

## 2021-01-15 MED ORDER — METOPROLOL TARTRATE 25 MG PO TABS
25.0000 mg | ORAL_TABLET | Freq: Two times a day (BID) | ORAL | Status: DC
  Administered 2021-01-15 – 2021-01-17 (×4): 25 mg via ORAL
  Filled 2021-01-15 (×4): qty 1

## 2021-01-15 MED ORDER — MORPHINE SULFATE (PF) 2 MG/ML IV SOLN: 1.0000 mg | INTRAVENOUS | Status: DC | PRN

## 2021-01-15 MED ORDER — SODIUM CHLORIDE 0.9 % IV BOLUS
500.0000 mL | Freq: Once | INTRAVENOUS | Status: AC
  Administered 2021-01-15: 500 mL via INTRAVENOUS

## 2021-01-15 NOTE — NC FL2 (Signed)
[place] MEDICAID FL2 LEVEL OF CARE SCREENING TOOL     IDENTIFICATION  Patient Name: [name] Birthdate: [birth year] Sex: female Admission Date (Current Location): 01/08/2021  [facility] and [place] Number:  Herbalist and Address:  [facility] [place], [place] [address] [place], [place] [address]      Provider Number: [id number]  Attending Physician Name and Address:  Geradine Girt, DO  Relative Name and Phone Number:       Current Level of Care: Hospital Recommended Level of Care: [place] Prior Approval Number:    Date Approved/Denied:   PASRR Number: [id number]  Discharge Plan: SNF    Current Diagnoses: Patient Active Problem List   Diagnosis Date Noted  . 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 Diarrhea 05/23/2013  . Hypothyroidism 05/10/2013  . HYPERTHYROIDISM 12/23/2006  . HYPERLIPIDEMIA 12/23/2006  . MIGRAINE HEADACHE 12/23/2006  . CATARACT NOS 12/23/2006  . Essential hypertension 12/23/2006  . PERIPHERAL VASCULAR DISEASE 12/23/2006  . ALLERGIC RHINITIS 12/23/2006  . COPD 12/23/2006  . GERD 12/23/2006  . LOW BACK PAIN 12/23/2006  . CARDIAC MURMUR 12/23/2006  . HYPERGLYCEMIA 12/23/2006  . FRACTURE, WRIST 12/23/2006    Orientation RESPIRATION BLADDER Height & Weight     Self  Normal Incontinent Weight: 136 lb 11 oz (62 kg) Height:  5\' 4"  (162.6 cm)  BEHAVIORAL SYMPTOMS/MOOD NEUROLOGICAL BOWEL NUTRITION STATUS      Incontinent Diet (See discharge summary)  AMBULATORY STATUS COMMUNICATION OF NEEDS Skin   Extensive Assist Verbally Normal                       Personal Care Assistance Level of Assistance  Bathing,Feeding,Dressing Bathing Assistance: Maximum assistance Feeding assistance: Independent Dressing Assistance: Maximum assistance     Functional Limitations Info  Sight,Hearing,Speech Sight Info: Adequate Hearing Info: Adequate Speech Info: Adequate     SPECIAL CARE FACTORS FREQUENCY  PT (By licensed PT),OT (By licensed OT)     PT Frequency: 5x a week OT Frequency: 5x a week            Contractures Contractures Info: Not present    Additional Factors Info  Code Status,Allergies Code Status Info: Full Allergies Info: Amoxicillin   Penicillins           Current Medications (01/15/2021):  This is the current hospital active medication list Current Facility-Administered Medications  Medication Dose Route Frequency Provider Last Rate Last Admin  . acetaminophen (TYLENOL) tablet 650 mg  650 mg Oral Q6H PRN Rod Can, MD   650 mg at 01/09/21 9629   Or  . acetaminophen (TYLENOL) suppository 650 mg  650 mg Rectal Q6H PRN Swinteck, Aaron Edelman, MD      . cyanocobalamin ((VITAMIN B-12)) injection 1,000 mcg  1,000 mcg Subcutaneous Q1200 Rod Can, MD   1,000 mcg at 01/15/21 1241   Followed by  . [START ON 01/17/2021] vitamin B-12 (CYANOCOBALAMIN) tablet 1,000 mcg  1,000 mcg Oral Daily Swinteck, Aaron Edelman, MD      . docusate sodium (COLACE) capsule 100 mg  100 mg Oral BID Rod Can, MD   100 mg at 01/14/21 2108  . enoxaparin (LOVENOX) injection 40 mg  40 mg Subcutaneous Q24H Rod Can, MD   40 mg at 01/15/21 0935  . feeding supplement (ENSURE ENLIVE / ENSURE PLUS) liquid 237 mL  237 mL Oral TID BM Rod Can, MD   237 mL at 01/14/21 2110  . haloperidol  lactate (HALDOL) injection 2 mg  2 mg Intravenous Q6H PRN Swinteck, Aaron Edelman, MD      . HYDROcodone-acetaminophen (NORCO/VICODIN) 5-325 MG per tablet 10 tablet  10 tablet Oral Q6H PRN Swinteck, Aaron Edelman, MD      . levothyroxine (SYNTHROID) tablet 88 mcg  88 mcg Oral QAC breakfast Rod Can, MD   88 mcg at 01/15/21 0555  . LORazepam (ATIVAN) tablet 0.5 mg  0.5 mg Oral Q6H PRN Rod Can, MD   0.5 mg at 01/14/21 0306  . menthol-cetylpyridinium (CEPACOL) lozenge 3 mg  1 lozenge Oral PRN Swinteck, Aaron Edelman, MD       Or  . phenol (CHLORASEPTIC) mouth spray 1 spray  1  spray Mouth/Throat PRN Swinteck, Aaron Edelman, MD      . metoCLOPramide (REGLAN) tablet 5-10 mg  5-10 mg Oral Q8H PRN Swinteck, Aaron Edelman, MD       Or  . metoCLOPramide (REGLAN) injection 5-10 mg  5-10 mg Intravenous Q8H PRN Swinteck, Aaron Edelman, MD      . metoprolol tartrate (LOPRESSOR) tablet 25 mg  25 mg Oral BID Vann, Jessica U, DO      . morphine 2 MG/ML injection 1 mg  1 mg Intravenous Q2H PRN Vann, Jessica U, DO      . ondansetron (ZOFRAN) tablet 4 mg  4 mg Oral Q6H PRN Swinteck, Aaron Edelman, MD       Or  . ondansetron (ZOFRAN) injection 4 mg  4 mg Intravenous Q6H PRN Swinteck, Aaron Edelman, MD      . pantoprazole (PROTONIX) EC tablet 40 mg  40 mg Oral Daily Rod Can, MD   40 mg at 01/15/21 4854     Discharge Medications: Please see discharge summary for a list of discharge medications.  Relevant Imaging Results:  Relevant Lab Results:   Additional Information SSN [id number]  Emeterio Reeve, [place]

## 2021-01-15 NOTE — TOC Initial Note (Signed)
Transition of Care [facility]) - Initial/Assessment Note    Patient Details  Name: [name] MRN: [id number] Date of Birth: [birth year]  Transition of Care [facility]) CM/SW Contact:    Coralee Pesa, [place] Phone Number: 01/15/2021, 3:52 PM  Clinical Narrative:                  CSW spoke with pt and daughters at bedside to discuss PT rec of short term rehab. Pt and daughters are in agreement and note the [facility] in [place] would be their first choice. Pt has NOT been vaccinated for covid. Daughters note that [place] or [facility] facilities would also be preferred. Pt lives with her daughter and has been fairly independent. She plans to return to her home after rehab. CSW will complete workup and faxout. SW will continue to follow for DC planning.  Expected Discharge Plan: Skilled Nursing Facility Barriers to Discharge: Insurance Authorization,SNF Pending bed offer,Continued Medical Work up   Patient Goals and CMS Choice Patient states their goals for this hospitalization and ongoing recovery are:: Pt states that she plans to get well enough to go back home with her daughter. CMS Medicare.gov Compare Post Acute Care list provided to:: Patient Represenative (must comment) (Daughters) Choice offered to / list presented to : Adult Children  Expected Discharge Plan and Services Expected Discharge Plan: [place] Acute Care Choice: [place] Living arrangements for the past 2 months: Single Family Home                                      Prior Living Arrangements/Services Living arrangements for the past 2 months: Single Family Home Lives with:: Adult Children Patient language and need for interpreter reviewed:: Yes Do you feel safe going back to the place where you live?: Yes      Need for Family Participation in Patient Care: Yes (Comment) Care giver support system in place?: Yes (comment)   Criminal Activity/Legal Involvement  Pertinent to Current Situation/Hospitalization: No - Comment as needed  Activities of Daily Living Home Assistive Devices/Equipment: Walker (specify type),Cane (specify quad or straight) ADL Screening (condition at time of admission) Patient's cognitive ability adequate to safely complete daily activities?: Yes Is the patient deaf or have difficulty hearing?: No Does the patient have difficulty seeing, even when wearing glasses/contacts?: No Does the patient have difficulty concentrating, remembering, or making decisions?: No Patient able to express need for assistance with ADLs?: Yes Does the patient have difficulty dressing or bathing?: No Independently performs ADLs?: Yes (appropriate for developmental age) Does the patient have difficulty walking or climbing stairs?: Yes Weakness of Legs: Both Weakness of Arms/Hands: None  Permission Sought/Granted Permission sought to share information with : Family Supports Permission granted to share information with : Yes, Verbal Permission Granted  Share Information with NAME: Corliss Skains     Permission granted to share info w Relationship: daughter  Permission granted to share info w Contact Information: [phone]  Emotional Assessment Appearance:: Appears stated age Attitude/Demeanor/Rapport: Engaged Affect (typically observed): Pleasant Orientation: : Oriented to Self Alcohol / Substance Use: Not Applicable Psych Involvement: No (comment)  Admission diagnosis:  Closed fracture of distal end of right femur, unspecified fracture morphology, initial encounter ([place]) [S72.401A] 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 Periprosthetic fracture around internal prosthetic  knee joint 01/08/2021  . Diarrhea 05/23/2013  . Hypothyroidism 05/10/2013  . HYPERTHYROIDISM 12/23/2006  . HYPERLIPIDEMIA 12/23/2006  . MIGRAINE HEADACHE 12/23/2006  .  CATARACT NOS 12/23/2006  . Essential hypertension 12/23/2006  . PERIPHERAL VASCULAR DISEASE 12/23/2006  . ALLERGIC RHINITIS 12/23/2006  . COPD 12/23/2006  . GERD 12/23/2006  . LOW BACK PAIN 12/23/2006  . CARDIAC MURMUR 12/23/2006  . HYPERGLYCEMIA 12/23/2006  . FRACTURE, WRIST 12/23/2006   PCP:  Rosita Fire, MD Pharmacy:   [facility] [address] [place] [address] Phone: [phone] Fax: [phone]     Social Determinants of Health (SDOH) Interventions    Readmission Risk Interventions No flowsheet data found.

## 2021-01-15 NOTE — Plan of Care (Signed)
  Problem: Education: Goal: Knowledge of General Education information will improve Description: Including pain rating scale, medication(s)/side effects and non-pharmacologic comfort measures Outcome: Progressing   Problem: Clinical Measurements: Goal: Respiratory complications will improve Outcome: Progressing   Problem: Activity: Goal: Risk for activity intolerance will decrease Outcome: Progressing   Problem: Nutrition: Goal: Adequate nutrition will be maintained Outcome: Progressing   Problem: Coping: Goal: Level of anxiety will decrease Outcome: Progressing   Problem: Elimination: Goal: Will not experience complications related to bowel motility Outcome: Progressing Goal: Will not experience complications related to urinary retention Outcome: Progressing   Problem: Pain Managment: Goal: General experience of comfort will improve Outcome: Progressing   Problem: Safety: Goal: Ability to remain free from injury will improve Outcome: Progressing

## 2021-01-15 NOTE — Progress Notes (Signed)
    Subjective:  Patient reports pain as mild to moderate.  Denies N/V/CP/SOB.   Objective:   VITALS:   Vitals:   01/15/21 0601 01/15/21 0730 01/15/21 1034 01/15/21 1113  BP: (!) 95/59 (!) 87/49 (!) 83/44 (!) 87/52  Pulse: 84 81 84 86  Resp: 20 18 20    Temp: 98.9 F (37.2 C) 97.6 F (36.4 C) 97.7 F (36.5 C)   TempSrc: Oral Oral Oral   SpO2: 100% 98% 100% 97%  Weight:      Height:        NAD ABD soft Neurovascular intact Sensation intact distally Intact pulses distally Dorsiflexion/Plantar flexion intact Incision: dressing C/D/I Hemovac in place: Draining more than 30cc per shift  Lab Results  Component Value Date   WBC 15.6 (H) 01/15/2021   HGB 10.6 (L) 01/15/2021   HCT 32.0 (L) 01/15/2021   MCV 94.1 01/15/2021   PLT 282 01/15/2021   BMET    Component Value Date/Time   NA 139 01/15/2021 0125   K 4.6 01/15/2021 0125   CL 103 01/15/2021 0125   CO2 21 (L) 01/15/2021 0125   GLUCOSE 134 (H) 01/15/2021 0125   BUN 34 (H) 01/15/2021 0125   CREATININE 2.01 (H) 01/15/2021 0125   CALCIUM 8.6 (L) 01/15/2021 0125   GFRNONAA 23 (L) 01/15/2021 0125   GFRAA >90 05/27/2013 9407     Assessment/Plan: 1 Day Post-Op   Active Problems:   Essential hypertension   PERIPHERAL VASCULAR DISEASE   Hypothyroidism   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

## 2021-01-15 NOTE — Evaluation (Signed)
Physical Therapy Evaluation Patient Details Name: [name] MRN: [id number] DOB: 08-22-1929 Today's Date: 01/15/2021   History of Present Illness  [name] is an 87yo female with Past medical history of anxiety, hypothyroidism, PVD.  Presents with complaints of leg pain after a fall; sustained a Comminuted periprosthetic left distal femur fracture, now s/p  Revision left total knee arthroplasty, femoral and tibial components, conversion to hinged distal femur replacement, WBAT  Clinical Impression   Patient is s/p above surgery resulting in functional limitations due to the deficits listed below (see PT Problem List). Comes from home where she lives with family; independent ambulator at baseline, occasionally uses an assistive device; presents to PT with decr functional mobility, pain with movement, and requires 2 person assist for safe transfers;  Patient will benefit from skilled PT to increase their independence and safety with mobility to allow discharge to the venue listed below.       Follow Up Recommendations SNF    Equipment Recommendations  Rolling walker with 5" wheels;3in1 (PT)    Recommendations for Other Services       Precautions / Restrictions Precautions Precautions: Fall Restrictions Weight Bearing Restrictions: No LLE Weight Bearing: Weight bearing as tolerated      Mobility  Bed Mobility Overal bed mobility: Needs Assistance Bed Mobility: Supine to Sit     Supine to sit: +2 for physical assistance;Mod assist     General bed mobility comments: Cues for technique, with mod assist to help scoot to EOB and elevate trunk to sit    Transfers Overall transfer level: Needs assistance Equipment used: 2 person hand held assist Transfers: Sit to/from Stand;Stand Pivot Transfers Sit to Stand: Mod assist;+2 physical assistance Stand pivot transfers: Mod assist;+2 physical assistance       General transfer comment: notable good anterior wight shift and  push through LEs for initial sit to stand; Heavy mod assist to power up and turn; attmepted sit to stand from recliner to stedy, and pt showed decr ability to lean forward and reachfor the bar to pull up on  Ambulation/Gait                Stairs            Wheelchair Mobility    Modified Rankin (Stroke Patients Only)       Balance Overall balance assessment: Needs assistance   Sitting balance-Leahy Scale: Poor Sitting balance - Comments: occasional posterior lean     Standing balance-Leahy Scale: Zero                               Pertinent Vitals/Pain Pain Assessment: Faces Faces Pain Scale: Hurts even more Pain Descriptors / Indicators: Aching;Grimacing;Guarding Pain Intervention(s): Limited activity within patient's tolerance;Repositioned    Home Living Family/patient expects to be discharged to:: Private residence Living Arrangements: Children Available Help at Discharge: Family;Available 24 hours/day (at or near 24 hours) Type of Home: House Home Access: Ramped entrance (need to confirm)     Home Layout: One level   Additional Comments: will get more home info over ensuing sessions    Prior Function Level of Independence: Needs assistance   Gait / Transfers Assistance Needed: uses cane occasionally           Hand Dominance        Extremity/Trunk Assessment   Upper Extremity Assessment Upper Extremity Assessment: Overall WFL for tasks assessed (pt describes previous shoulder injuries)  Lower Extremity Assessment Lower Extremity Assessment: LLE deficits/detail LLE Deficits / Details: Grossly decr AROM and stength, limited by pain LLE: Unable to fully assess due to pain       Communication   Communication: No difficulties;Other (comment) (though quie tqngetnial at times)  Cognition Arousal/Alertness: Awake/alert Behavior During Therapy: WFL for tasks assessed/performed Overall Cognitive Status: Within Functional  Limits for tasks assessed (for simple mobility tasks)                                 General Comments: Noted quite hesitant with novel situations, like the stedy; at times, self-limiting      General Comments General comments (skin integrity, edema, etc.): session conducted on Room Air and o2 sats remained at or above 90%    Exercises     Assessment/Plan    PT Assessment Patient needs continued PT services  PT Problem List Decreased strength;Decreased range of motion;Decreased activity tolerance;Decreased balance;Decreased mobility;Decreased coordination;Decreased cognition;Decreased knowledge of use of DME;Decreased safety awareness;Decreased knowledge of precautions       PT Treatment Interventions DME instruction;Gait training;Functional mobility training;Therapeutic activities;Therapeutic exercise;Balance training;Patient/family education;Cognitive remediation    PT Goals (Current goals can be found in the Care Plan section)  Acute Rehab PT Goals Patient Stated Goal: agreeable to getting up to the cahir PT Goal Formulation: With patient Time For Goal Achievement: 01/29/21 Potential to Achieve Goals: Fair    Frequency Min 2X/week   Barriers to discharge        Co-evaluation               AM-PAC PT "6 Clicks" Mobility  Outcome Measure Help needed turning from your back to your side while in a flat bed without using bedrails?: A Lot Help needed moving from lying on your back to sitting on the side of a flat bed without using bedrails?: A Lot Help needed moving to and from a bed to a chair (including a wheelchair)?: A Lot Help needed standing up from a chair using your arms (e.g., wheelchair or bedside chair)?: A Lot Help needed to walk in hospital room?: Total Help needed climbing 3-5 steps with a railing? : Total 6 Click Score: 10    End of Session Equipment Utilized During Treatment: Gait belt Activity Tolerance: Patient tolerated treatment  well Patient left: in chair;with call bell/phone within reach;with family/visitor present Nurse Communication: Mobility status PT Visit Diagnosis: Unsteadiness on feet (R26.81);Other abnormalities of gait and mobility (R26.89)    Time: 0867-6195 PT Time Calculation (min) (ACUTE ONLY): 27 min   Charges:   PT Evaluation $PT Eval Moderate Complexity: 1 Mod PT Treatments $Therapeutic Activity: 8-22 mins        Roney Marion, PT  Acute Rehabilitation Services Pager [phone] Office [phone]   Colletta [place] 01/15/2021, 6:52 PM

## 2021-01-15 NOTE — Progress Notes (Signed)
[facility] Progress Note  Patient: [name]    HYI:[id number]  DOA: 01/08/2021     Date of Service: the patient was seen and examined on 01/15/2021  Brief hospital course: Past medical history of anxiety, hypothyroidism, PVD.  Presents with complaints of leg pain after a fall.  Found to have acute impacted fracture of left distal femur.  Orthopedic consulted.  s/p surgery on Sunday.  Also have anemia and B12 deficiency.  PT Eval pending   Assessment and Plan:  Periprosthetic distal femur fracture Orthopedic consult appreciated Fracture occurred after mechanical fall. Initially admitted to [facility] transfer to [facility] for surgery Pain control per orthopedic. Chest x-ray and EKG unremarkable. -s/p OR on 2/27   Hypothyroidism TSH significantly low. Dose decreased from 100 to 88 MCG.  Leukocytosis -monitor for fever -denies painful urination -no cough or hypoxia  Essential hypertension -now hypotensive -hold BP meds -IVF and close monitoring -lactic acid normal   PVD Patient on aspirin and cilostazol.  Currently on hold in the setting of anemia.   Iron deficiency as well as B12 deficiency anemia. Hemoglobin significantly low on 2/23.  On recheck hemoglobin stable. Going down throughout this hospitalization likely in the setting of dilution. Iron level significantly low.  B12 significantly low.  Will replace parenterally for now.  Goal hemoglobin more than 10 per surgery. -Hgb 12 today   AKI -IVF and recheck in AM  Hypokalemia -repleted  Hyponatremia -resolved   Due to poor dentition family is providing patient's pured diet at home - dysphagia 2 diet.  Also add Ensure.  Suspected Dementia -delirium precautions -B12 replacement -outpatient follow up    DVT Prophylaxis:   enoxaparin (LOVENOX) injection 40 mg Start: 01/15/21 0800 SCDs Start: 01/14/21 1317   Advance goals of care discussion: Full code  Family  Communication: no family was present at bedside-- called daughters to update .  Disposition:  Status is: Inpatient  Remains inpatient appropriate because:Ongoing diagnostic testing needed not appropriate for outpatient work up  Dispo: The patient is from: Home              Anticipated d/c is to: SNF              Anticipated d/c date is: 1-2 days              Patient currently is not medically stable to d/c.     Subjective: wants to get out of bed with PT today  Physical Exam:   General: Appearance:    elderly female in no acute distress     Lungs:     respirations unlabored  Heart:    Normal heart rate. Normal rhythm.    MS:   All extremities are intact.   Neurologic:   Awake, alert, pleasant and cooperative     Vitals:   01/15/21 0601 01/15/21 0730 01/15/21 1034 01/15/21 1113  BP: (!) 95/59 (!) 87/49 (!) 83/44 (!) 87/52  Pulse: 84 81 84 86  Resp: 20 18 20    Temp: 98.9 F (37.2 C) 97.6 F (36.4 C) 97.7 F (36.5 C)   TempSrc: Oral Oral Oral   SpO2: 100% 98% 100% 97%  Weight:      Height:        Intake/Output Summary (Last 24 hours) at 01/15/2021 1306 Last data filed at 01/15/2021 1100 Gross per 24 hour  Intake 937 ml  Output 310 ml  Net 627 ml   [employer]   01/13/21  0500 01/14/21 0500 01/15/21 0500  Weight: 58.8 kg 58.9 kg 62 kg     CBC: Recent Labs  Lab 01/10/21 1036 01/11/21 0753 01/12/21 0047 01/13/21 0207 01/14/21 0113 01/15/21 0125  WBC 7.4 9.2 9.1 10.8* 11.7* 15.6*  NEUTROABS 5.7  --   --   --   --   --   HGB 7.9* 8.4* 11.4* 12.3 12.2 10.6*  HCT 24.2* 25.2* 32.8* 36.9 35.9* 32.0*  MCV 87.7 86.9 87.7 88.9 90.9 94.1  PLT 224 242 228 296 298 366   Basic Metabolic Panel: Recent Labs  Lab 01/10/21 0305 01/12/21 0047 01/13/21 0207 01/14/21 0113 01/15/21 0125  NA 131* 134* 135 139 139  K 3.8 3.6 3.0* 4.0 4.6  CL 95* 97* 97* 104 103  CO2 27 25 28  21* 21*  GLUCOSE 98 76 110* 87 134*  BUN 12 11 18 14  34*  CREATININE 0.93 0.77 1.01*  0.88 2.01*  CALCIUM 8.7* 9.2 9.4 9.4 8.6*  MG  --  2.1 2.2  --   --     Studies: No results found.  Scheduled Meds: . cyanocobalamin  1,000 mcg Subcutaneous Q1200   Followed by  . [START ON 01/17/2021] vitamin B-12  1,000 mcg Oral Daily  . docusate sodium  100 mg Oral BID  . enoxaparin (LOVENOX) injection  40 mg Subcutaneous Q24H  . feeding supplement  237 mL Oral TID BM  . levothyroxine  88 mcg Oral QAC breakfast  . metoprolol tartrate  25 mg Oral BID  . pantoprazole  40 mg Oral Daily   Continuous Infusions:  PRN Meds: acetaminophen **OR** acetaminophen, haloperidol lactate, HYDROcodone-acetaminophen, LORazepam, menthol-cetylpyridinium **OR** phenol, metoCLOPramide **OR** metoCLOPramide (REGLAN) injection, morphine injection, ondansetron **OR** ondansetron (ZOFRAN) IV  Time spent: 25 minutes  Author: Eulogio Bear DO [facility] 01/15/2021 1:06 PM  To reach On-call, see care teams to locate the attending and reach out via [email or web address]. Between 7PM-7AM, please contact night-coverage If you still have difficulty reaching the attending provider, please page the [facility] (Director on Call) for [facility] on amion for assistance.

## 2021-01-15 NOTE — Plan of Care (Signed)

## 2021-01-16 ENCOUNTER — Encounter (HOSPITAL_COMMUNITY): Payer: Self-pay | Admitting: Orthopedic Surgery

## 2021-01-16 DIAGNOSIS — I10 Essential (primary) hypertension: Secondary | ICD-10-CM | POA: Diagnosis not present

## 2021-01-16 DIAGNOSIS — S72401A Unspecified fracture of lower end of right femur, initial encounter for closed fracture: Secondary | ICD-10-CM | POA: Diagnosis not present

## 2021-01-16 LAB — CBC
HCT: 26.1 % — ABNORMAL LOW (ref 36.0–46.0)
Hemoglobin: 8.4 g/dL — ABNORMAL LOW (ref 12.0–15.0)
MCH: 30.2 pg (ref 26.0–34.0)
MCHC: 32.2 g/dL (ref 30.0–36.0)
MCV: 93.9 fL (ref 80.0–100.0)
Platelets: 210 10*3/uL (ref 150–400)
RBC: 2.78 MIL/uL — ABNORMAL LOW (ref 3.87–5.11)
RDW: 15.5 % (ref 11.5–15.5)
WBC: 13.1 10*3/uL — ABNORMAL HIGH (ref 4.0–10.5)
nRBC: 0 % (ref 0.0–0.2)

## 2021-01-16 LAB — BASIC METABOLIC PANEL
Anion gap: 9 (ref 5–15)
BUN: 41 mg/dL — ABNORMAL HIGH (ref 8–23)
CO2: 22 mmol/L (ref 22–32)
Calcium: 7.9 mg/dL — ABNORMAL LOW (ref 8.9–10.3)
Chloride: 105 mmol/L (ref 98–111)
Creatinine, Ser: 1.71 mg/dL — ABNORMAL HIGH (ref 0.44–1.00)
GFR, Estimated: 28 mL/min — ABNORMAL LOW (ref >60)
Glucose, Bld: 95 mg/dL (ref 70–99)
Potassium: 3.5 mmol/L (ref 3.5–5.1)
Sodium: 136 mmol/L (ref 135–145)

## 2021-01-16 LAB — SARS CORONAVIRUS 2 (TAT 6-24 HRS): SARS Coronavirus 2: NEGATIVE

## 2021-01-16 MED ORDER — ASPIRIN 81 MG PO CHEW: 81.0000 mg | CHEWABLE_TABLET | Freq: Two times a day (BID) | ORAL | 0 refills | Status: DC

## 2021-01-16 MED ORDER — HYDROCODONE-ACETAMINOPHEN 5-325 MG PO TABS: 1.0000 | ORAL_TABLET | ORAL | 0 refills | Status: DC | PRN

## 2021-01-16 MED ORDER — SODIUM CHLORIDE 0.9 % IV BOLUS
500.0000 mL | Freq: Once | INTRAVENOUS | Status: AC
  Administered 2021-01-16: 500 mL via INTRAVENOUS

## 2021-01-16 NOTE — Progress Notes (Addendum)
    Subjective:  Patient reports pain as mild to moderate.  Denies N/V/CP/SOB.   Objective:   VITALS:   Vitals:   01/15/21 1304 01/15/21 1602 01/15/21 2152 01/16/21 0600  BP: (!) 100/54 (!) 118/51 (!) 105/46 (!) 120/52  Pulse: 81 93 72 92  Resp: 17 18    Temp: 98 F (36.7 C) 98.3 F (36.8 C)  98.1 F (36.7 C)  TempSrc: Oral Oral  Oral  SpO2: 98% 97% 95% 95%  Weight:      Height:        NAD ABD soft Neurovascular intact Sensation intact distally Intact pulses distally Dorsiflexion/Plantar flexion intact Incision: dressing C/D/I Hemovac in place: Still draining more than 30cc per shift  Lab Results  Component Value Date   WBC 13.1 (H) 01/16/2021   HGB 8.4 (L) 01/16/2021   HCT 26.1 (L) 01/16/2021   MCV 93.9 01/16/2021   PLT 210 01/16/2021   BMET    Component Value Date/Time   NA 136 01/16/2021 0151   K 3.5 01/16/2021 0151   CL 105 01/16/2021 0151   CO2 22 01/16/2021 0151   GLUCOSE 95 01/16/2021 0151   BUN 41 (H) 01/16/2021 0151   CREATININE 1.71 (H) 01/16/2021 0151   CALCIUM 7.9 (L) 01/16/2021 0151   GFRNONAA 28 (L) 01/16/2021 0151   GFRAA >90 05/27/2013 2706     Assessment/Plan: 2 Days Post-Op   Active Problems:   Essential hypertension   PERIPHERAL VASCULAR DISEASE   Hypothyroidism   Periprosthetic fracture around internal prosthetic knee joint   WBAT with walker DVT ppx: Lovenox, SCDs, TEDS   Plan for ASA 81mg  BID at D/C PO pain control Hemovac: D/C once output drops below 30cc per shift ABLA: HgB 8.4 this AM.  Continue to monitor Elevated creatinine: 1.71 today, treat per hospitalist recommendations PT/OT Dispo: D/C planning     Dorothyann Peng 01/16/2021, 8:18 AM [facility] is now [address] [place], [place], [place] [address] Phone: [phone] [email or web address]  [employer]

## 2021-01-16 NOTE — Care Management Important Message (Signed)
Important Message  Patient Details  Name: [name] MRN: [id number] Date of Birth: Apr 13, 1929   Medicare Important Message Given:  Yes     [name] P [name] 01/16/2021, 2:29 PM

## 2021-01-16 NOTE — TOC Progression Note (Signed)
Transition of Care [facility]) - Progression Note    Patient Details  Name: [name] MRN: [id number] Date of Birth: 07-21-1929  Transition of Care [facility]) CM/SW [place], [place] Phone Number: 01/16/2021, 4:07 PM  Clinical Narrative:    CSW met with daughters at bedside and gave bed offers. Pt's dtr initially stated she would like [place], but when she spoke to CSW in the hall she then said she wanted [facility]. CSW called to confirm w/ [facility] and they stated that unvaccinated pt's had to go to the [place] facility, as there was a covid outbreak at [facility] so they are not accepting unvaccinated people. CSW attempted to contact both daughters x2 to update them, VM were left.   5:25 CSW received call back from dtr [name] who was advised [facility] of [place] could not accept. She noted that she would need to talk to her sister to decide on a different facility. CSW noted that SW would follow up with them in the morning for a bed choice. A new insurance Josem Kaufmann will need to be started, They were advised this may delay DC until the afternoon. SW will continue to follow for dc planning. Expected Discharge Plan: Skilled Nursing Facility Barriers to Discharge: Insurance Authorization,SNF Pending bed offer,Continued Medical Work up  Expected Discharge Plan and Services Expected Discharge Plan: [place] Choice: [place] arrangements for the past 2 months: Single Family Home                                       Social Determinants of Health (SDOH) Interventions    Readmission Risk Interventions No flowsheet data found.

## 2021-01-16 NOTE — Plan of Care (Signed)
  Problem: Education: Goal: Knowledge of General Education information will improve Description: Including pain rating scale, medication(s)/side effects and non-pharmacologic comfort measures Outcome: Progressing   Problem: Clinical Measurements: Goal: Respiratory complications will improve Outcome: Progressing Goal: Cardiovascular complication will be avoided Outcome: Progressing   Problem: Nutrition: Goal: Adequate nutrition will be maintained Outcome: Progressing   Problem: Coping: Goal: Level of anxiety will decrease Outcome: Progressing   Problem: Elimination: Goal: Will not experience complications related to bowel motility Outcome: Progressing   Problem: Pain Managment: Goal: General experience of comfort will improve Outcome: Progressing   Problem: Safety: Goal: Ability to remain free from injury will improve Outcome: Progressing

## 2021-01-16 NOTE — Progress Notes (Signed)
Appropriate Use Committee Chart Review  Chart reviewed by the physician advisor with input from [name] and the attending MD as needed for review of the appropriateness for SNF referral.  TOC notes, PT/OT/ST notes, nursing notes and physician notes reviewed for medical necessity to determine if the patient's needs are appropriate for short-term rehab to return to a prior level of function versus the likely need for custodial care.  At this time, the patient meets Medicare criteria for SNF placement.   Jacquelynn Cree, MD Chief Physician Advisor  01/16/2021 9:59 AM

## 2021-01-16 NOTE — Progress Notes (Signed)
[facility] Progress Note  Patient: [name]    PIR:[id number]  DOA: 01/08/2021     Date of Service: the patient was seen and examined on 01/16/2021  Brief hospital course: Past medical history of anxiety, hypothyroidism, PVD.  Presents with complaints of leg pain after a fall.  Found to have acute impacted fracture of left distal femur.  Orthopedic consulted.  s/p surgery on Sunday.  Also have anemia and B12 deficiency.  PT Eval pending   Assessment and Plan:  Periprosthetic distal femur fracture Orthopedic consult appreciated Fracture occurred after mechanical fall. Initially admitted to [facility] transfer to [facility] for surgery Pain control per orthopedic. Chest x-ray and EKG unremarkable. -s/p OR on 2/27   Hypothyroidism TSH significantly low. Dose decreased from 100 to 88 MCG.  Leukocytosis -monitor for fever -denies painful urination -no cough or hypoxia  Essential hypertension -episode of hypotension -holding parameters on BP meds -IVF and close monitoring -lactic acid normal   PVD Patient on aspirin and cilostazol.  Currently on hold in the setting of anemia.   Iron deficiency as well as B12 deficiency anemia. Hemoglobin significantly low on 2/23.  On recheck hemoglobin stable. Going down throughout this hospitalization likely in the setting of dilution. Iron level significantly low.  B12 significantly low.  Will replace parenterally for now.  Transfuse for <8   AKI -IVF and recheck in AM  Hypokalemia -repleted  Hyponatremia -resolved   Due to poor dentition family is providing patient's pured diet at home - dysphagia 3 diet.  Also add Ensure.  Suspected Dementia -delirium precautions -B12 replacement -outpatient follow up    DVT Prophylaxis:   enoxaparin (LOVENOX) injection 40 mg Start: 01/15/21 0800 SCDs Start: 01/14/21 1317   Advance goals of care discussion: Full code  Family Communication: no family was  present at bedside-- called daughters to update 2/28 .  Disposition:  Status is: Inpatient  Remains inpatient appropriate because:Ongoing diagnostic testing needed not appropriate for outpatient work up  Dispo: The patient is from: Home              Anticipated d/c is to: SNF              Anticipated d/c date is: 1-2 days              Patient currently is not medically stable to d/c.- await improvement of CR and stable H/H     Subjective: says she does not like being in bed all day and wants to get up  Physical Exam:   General: Appearance:    elderly female in no acute distress     Lungs:     respirations unlabored  Heart:    Normal heart rate.   MS:   All extremities are intact.   Neurologic:   Awake, alert, pleasant      Vitals:   01/15/21 1602 01/15/21 2152 01/16/21 0600 01/16/21 0826  BP: (!) 118/51 (!) 105/46 (!) 120/52 (!) 110/56  Pulse: 93 72 92 74  Resp: 18   18  Temp: 98.3 F (36.8 C)  98.1 F (36.7 C) 98.9 F (37.2 C)  TempSrc: Oral  Oral Oral  SpO2: 97% 95% 95% 99%  Weight:      Height:        Intake/Output Summary (Last 24 hours) at 01/16/2021 1133 Last data filed at 01/16/2021 1100 Gross per 24 hour  Intake 1217 ml  Output 150 ml  Net 1067 ml  Filed Weights   01/13/21 0500 01/14/21 0500 01/15/21 0500  Weight: 58.8 kg 58.9 kg 62 kg     CBC: Recent Labs  Lab 01/10/21 1036 01/11/21 0753 01/12/21 0047 01/13/21 0207 01/14/21 0113 01/15/21 0125 01/16/21 0151  WBC 7.4   < > 9.1 10.8* 11.7* 15.6* 13.1*  NEUTROABS 5.7  --   --   --   --   --   --   HGB 7.9*   < > 11.4* 12.3 12.2 10.6* 8.4*  HCT 24.2*   < > 32.8* 36.9 35.9* 32.0* 26.1*  MCV 87.7   < > 87.7 88.9 90.9 94.1 93.9  PLT 224   < > 228 296 298 282 210   < > = values in this interval not displayed.   Basic Metabolic Panel: Recent Labs  Lab 01/12/21 0047 01/13/21 0207 01/14/21 0113 01/15/21 0125 01/16/21 0151  NA 134* 135 139 139 136  K 3.6 3.0* 4.0 4.6 3.5  CL 97* 97* 104  103 105  CO2 25 28 21* 21* 22  GLUCOSE 76 110* 87 134* 95  BUN 11 18 14  34* 41*  CREATININE 0.77 1.01* 0.88 2.01* 1.71*  CALCIUM 9.2 9.4 9.4 8.6* 7.9*  MG 2.1 2.2  --   --   --     Studies: No results found.  Scheduled Meds: . cyanocobalamin  1,000 mcg Subcutaneous Q1200   Followed by  . [START ON 01/17/2021] vitamin B-12  1,000 mcg Oral Daily  . docusate sodium  100 mg Oral BID  . enoxaparin (LOVENOX) injection  40 mg Subcutaneous Q24H  . feeding supplement  237 mL Oral TID BM  . levothyroxine  88 mcg Oral QAC breakfast  . metoprolol tartrate  25 mg Oral BID  . pantoprazole  40 mg Oral Daily   Continuous Infusions:  PRN Meds: acetaminophen **OR** acetaminophen, haloperidol lactate, HYDROcodone-acetaminophen, LORazepam, menthol-cetylpyridinium **OR** phenol, metoCLOPramide **OR** metoCLOPramide (REGLAN) injection, morphine injection, ondansetron **OR** ondansetron (ZOFRAN) IV  Time spent: 25 minutes  Author: Eulogio Bear DO [facility] 01/16/2021 11:33 AM  To reach On-call, see care teams to locate the attending and reach out via [email or web address]. Between 7PM-7AM, please contact night-coverage If you still have difficulty reaching the attending provider, please page the [facility] (Director on Call) for [facility] on amion for assistance.

## 2021-01-17 DIAGNOSIS — I10 Essential (primary) hypertension: Secondary | ICD-10-CM | POA: Diagnosis not present

## 2021-01-17 DIAGNOSIS — E039 Hypothyroidism, unspecified: Secondary | ICD-10-CM | POA: Diagnosis not present

## 2021-01-17 DIAGNOSIS — S72401A Unspecified fracture of lower end of right femur, initial encounter for closed fracture: Secondary | ICD-10-CM | POA: Diagnosis not present

## 2021-01-17 LAB — BASIC METABOLIC PANEL
Anion gap: 8 (ref 5–15)
BUN: 32 mg/dL — ABNORMAL HIGH (ref 8–23)
CO2: 22 mmol/L (ref 22–32)
Calcium: 8.3 mg/dL — ABNORMAL LOW (ref 8.9–10.3)
Chloride: 108 mmol/L (ref 98–111)
Creatinine, Ser: 1.18 mg/dL — ABNORMAL HIGH (ref 0.44–1.00)
GFR, Estimated: 44 mL/min — ABNORMAL LOW (ref >60)
Glucose, Bld: 92 mg/dL (ref 70–99)
Potassium: 3.5 mmol/L (ref 3.5–5.1)
Sodium: 138 mmol/L (ref 135–145)

## 2021-01-17 LAB — CBC
HCT: 26.1 % — ABNORMAL LOW (ref 36.0–46.0)
Hemoglobin: 8.3 g/dL — ABNORMAL LOW (ref 12.0–15.0)
MCH: 30.3 pg (ref 26.0–34.0)
MCHC: 31.8 g/dL (ref 30.0–36.0)
MCV: 95.3 fL (ref 80.0–100.0)
Platelets: 225 10*3/uL (ref 150–400)
RBC: 2.74 MIL/uL — ABNORMAL LOW (ref 3.87–5.11)
RDW: 16.3 % — ABNORMAL HIGH (ref 11.5–15.5)
WBC: 13.4 10*3/uL — ABNORMAL HIGH (ref 4.0–10.5)
nRBC: 0 % (ref 0.0–0.2)

## 2021-01-17 MED ORDER — LEVOTHYROXINE SODIUM 88 MCG PO TABS: 88.0000 ug | ORAL_TABLET | Freq: Every day | ORAL | Status: DC

## 2021-01-17 MED ORDER — ACETAMINOPHEN 325 MG PO TABS: 650.0000 mg | ORAL_TABLET | Freq: Four times a day (QID) | ORAL | Status: AC | PRN

## 2021-01-17 MED ORDER — DOCUSATE SODIUM 100 MG PO CAPS: 100.0000 mg | ORAL_CAPSULE | Freq: Two times a day (BID) | ORAL | 0 refills | Status: DC

## 2021-01-17 MED ORDER — METOPROLOL TARTRATE 25 MG PO TABS: 25.0000 mg | ORAL_TABLET | Freq: Two times a day (BID) | ORAL | Status: AC

## 2021-01-17 MED ORDER — CYANOCOBALAMIN 1000 MCG PO TABS: 1000.0000 ug | ORAL_TABLET | Freq: Every day | ORAL | Status: DC

## 2021-01-17 MED ORDER — ENSURE ENLIVE PO LIQD: 237.0000 mL | Freq: Three times a day (TID) | ORAL | 12 refills | Status: DC

## 2021-01-17 MED ORDER — LORAZEPAM 0.5 MG PO TABS: 0.5000 mg | ORAL_TABLET | Freq: Four times a day (QID) | ORAL | 0 refills | Status: DC | PRN

## 2021-01-17 NOTE — TOC Transition Note (Signed)
Transition of Care [facility]) - CM/SW Discharge Note   Patient Details  Name: [name] MRN: [id number] Date of Birth: 09/21/1929  Transition of Care [facility]) CM/SW Contact:  Amador Cun[place], [place] Phone Number: 01/17/2021, 10:52 AM   Clinical Narrative:  Pt for dc to [facility] today. Spoke to both pt's dtrs [name] and [place]) who are agreeable to [facility] instead of [place]. Confirmed with Ebony Hail in [facility] admissions they are able to accept pt to [facility] facility today. Navi/[employer] auth received [id number]. PTAR arranged for 2pm and RN provided with number for report. SW signing off at dc.   Wandra Feinstein, MSW, LCSW [phone] (coverage)        Final next level of care: Skilled Nursing Facility Barriers to Discharge: No Barriers Identified   Patient Goals and CMS Choice Patient states their goals for this hospitalization and ongoing recovery are:: Pt states that she plans to get well enough to go back home with her daughter. CMS Medicare.gov Compare Post Acute Care list provided to:: Patient Represenative (must comment) (Daughters) Choice offered to / list presented to : Adult Children  Discharge Placement              Patient chooses bed at: [facility] Patient to be transferred to facility by: [place] Name of family member notified: [name]/dtr Patient and family notified of of transfer: 01/17/21  Discharge Plan and Services     Post Acute Care Choice: [place]                               Social Determinants of Health (SDOH) Interventions     Readmission Risk Interventions No flowsheet data found.

## 2021-01-17 NOTE — Progress Notes (Signed)
Physical Therapy Treatment Patient Details Name: [name] MRN: [id number] DOB: [birth year] Today's Date: 01/17/2021    History of Present Illness Ms. Mccorkel is an 87yo female with Past medical history of anxiety, hypothyroidism, PVD.  Presents with complaints of leg pain after a fall; sustained a Comminuted periprosthetic left distal femur fracture, now s/p  Revision left total knee arthroplasty, femoral and tibial components, conversion to hinged distal femur replacement, WBAT    PT Comments    Continuing work on functional mobility and activity tolerance;  Session focused on bed mobility, sitting posture, and especially sit<>stand training including work on standing posture and tolerance; Overall moderate assist for supine to sit and Mod assist of 2 (bilateral supoprt at elbow/shoulder girdle and gait belt) for sit to stand transfers into the [place]; Notably improved standing tolerance, stood 3 times and able to achieve fully upright posture with full trunk and hip extension; pt seemed pleased with being able to stand; Overall progressing well; Anticipate continuing good progress at post-acute rehabilitation.   Follow Up Recommendations  SNF     Equipment Recommendations  Rolling walker with 5" wheels;3in1 (PT)    Recommendations for Other Services       Precautions / Restrictions Precautions Precautions: Fall Restrictions LLE Weight Bearing: Weight bearing as tolerated    Mobility  Bed Mobility Overal bed mobility: Needs Assistance Bed Mobility: Supine to Sit     Supine to sit: +2 for physical assistance;Mod assist     General bed mobility comments: Cues for technique, with mod assist to help scoot to EOB and elevate trunk to sit; notable improved effort with moving her feet to EOB and reaching for rails    Transfers Overall transfer level: Needs assistance Equipment used: Ambulation equipment used Transfers: Sit to/from Stand Sit to Stand: Mod assist;+2 physical  assistance         General transfer comment: Stood twice to stedy from semi-raised bed; Heavy mod assist to power up; Good anterior weight shift; stood once from high seat of stedy with minguard assist for safety, but no physical assist to rise; once up on feet heavy cueing and facilitation for fully extending hips and keeping upper trunk extended  Ambulation/Gait                 Stairs             Wheelchair Mobility    Modified Rankin (Stroke Patients Only)       Balance     Sitting balance-Leahy Scale: Poor (approaching Fair) Sitting balance - Comments: occasional posterior lean     Standing balance-Leahy Scale: Poor Standing balance comment: Stood 3 times within stedy with ceing and facilitation of fully upright standing, including forward lean to hug her daughter                            Cognition Arousal/Alertness: Awake/alert Behavior During Therapy: WFL for tasks assessed/performed Overall Cognitive Status: Within Functional Limits for tasks assessed (for simple mobility tasks)                                 General Comments: Better response to using stedy with simple introduction that it is a Licensed conveyancer      Exercises General Exercises - Lower Extremity Ankle Circles/Pumps: AROM;Both;5 reps Quad Sets: AROM;Both;5 reps (needing tactile cueing) Heel Slides: AAROM;Left;5 reps    General  Comments General comments (skin integrity, edema, etc.): noted pt's hemovac drain tubing got disconnected while positioning her; reconnected, recharged neg pressure on hemovac, and notified RN      Pertinent Vitals/Pain Pain Assessment: Faces Faces Pain Scale: Hurts even more Pain Location: L knee, in particular with use of the Stedy Pain Descriptors / Indicators: Grimacing Pain Intervention(s): Monitored during session;Other (comment) (Added pillow to knee block on stedy)    Home Living                      Prior Function             PT Goals (current goals can now be found in the care plan section) Acute Rehab PT Goals Patient Stated Goal: agreeable to getting up to the cahir PT Goal Formulation: With patient Time For Goal Achievement: 01/29/21 Potential to Achieve Goals: Fair Progress towards PT goals: Progressing toward goals    Frequency    Min 2X/week      PT Plan Current plan remains appropriate    Co-evaluation              AM-PAC PT "6 Clicks" Mobility   Outcome Measure  Help needed turning from your back to your side while in a flat bed without using bedrails?: A Lot Help needed moving from lying on your back to sitting on the side of a flat bed without using bedrails?: A Lot Help needed moving to and from a bed to a chair (including a wheelchair)?: A Lot Help needed standing up from a chair using your arms (e.g., wheelchair or bedside chair)?: A Lot Help needed to walk in hospital room?: Total Help needed climbing 3-5 steps with a railing? : Total 6 Click Score: 10    End of Session Equipment Utilized During Treatment: Gait belt Activity Tolerance: Patient tolerated treatment well Patient left: in bed;with call bell/phone within reach;with bed alarm set (bed in semi-cahir position) Nurse Communication: Mobility status PT Visit Diagnosis: Unsteadiness on feet (R26.81);Other abnormalities of gait and mobility (R26.89)     Time: 0051-1021 PT Time Calculation (min) (ACUTE ONLY): 31 min  Charges:  $Therapeutic Activity: 23-37 mins                     Roney Marion, PT  Acute Rehabilitation Services Pager [phone] Office [phone]    Colletta [place] 01/17/2021, 12:42 PM

## 2021-01-17 NOTE — Discharge Summary (Signed)
Physician Discharge Summary  [name] WUJ:[id number] DOB: [birth year] DOA: 01/08/2021  PCP: Rosita Fire, MD  Admit date: 01/08/2021 Discharge date: 01/17/2021  Admitted From: home Discharge disposition: SNF   Recommendations for Outpatient Follow-Up:   1. Resume lasix if signs of fluid overload and when patient eating better 2. Cbc/bmp 1 week 3. Monitor B12 and Fe levels 4. TSH 6 weeks   Discharge Diagnosis:   Active Problems:   Essential hypertension   PERIPHERAL VASCULAR DISEASE   Hypothyroidism   Periprosthetic fracture around internal prosthetic knee joint    Discharge Condition: Improved.  Diet recommendation: soft diet  Wound care: None.  Code status: Full.   History of Present Illness:     [name]  is a 87 y.o. female, with history of anxiety, hypothyroidism, PVD, presents to the ED with a chief complaint of leg pain.  Patient reports that she got up from sleep to go to the bathroom with the assistance of her cane and her daughter, she reached out to grab a hold of the back of her recliner, missed, and fell down when she lost her balance.  She reports no chest pain, no palpitations, no shortness of breath, no dizziness prior to fall.  She reports that she has been eating and drinking as normal.  Daughter was with her and reports that she did not hit her head, she did not lose consciousness.  Daughter reports that she was able to catch patient on the right side, so she took more of a hit on her left.  Daughter reports the patient is otherwise been in her normal state of health.  She did have a cold but that was several months ago, that was last time she was anything other than her baseline health.  Patient does not smoke, does not drink.  She is not vaccinated for Covid and does not wish to be.  CODE STATUS was discussed and patient and her daughter agree that they would like her to remain full code.   Hospital Course by Problem:    Periprosthetic distal femur fracture Orthopedic consult appreciated Fracture occurred after mechanical fall. Initially admitted to [facility] transfer to [facility] for surgery Pain control per orthopedic. Chest x-ray and EKG unremarkable. -s/p OR on 2/27 -drain to be removed prior to d/c   Hypothyroidism TSH significantly low. Dose decreased from 100 to 88 MCG.  Leukocytosis -trending down, no sign of fever  Essential hypertension -lower dose of BP meds due to hypotension   PVD -asa per ortho   Iron deficiency as well as B12 deficiency anemia. Hemoglobin significantly low on 2/23.  On recheck hemoglobin stable. Going down throughout this hospitalization likely in the setting of dilution. Iron level significantly low.  B12 significantly low.  Will replace parenterally for now.  Transfuse for <8   AKI -resolved with IVF -encourage PO intake  Hypokalemia -repleted  Hyponatremia -resolved   Due to poor dentition family is providing patient's pured diet at home - dysphagia 3 diet.  Also add Ensure.  Suspected Dementia -delirium precautions -B12 replacement -outpatient follow up    Medical Consultants:    ortho  Discharge Exam:   Vitals:   01/16/21 2034 01/17/21 0800  BP: (!) 114/55 (!) 125/58  Pulse: 64 77  Resp:  17  Temp: 98 F (36.7 C) 98.2 F (36.8 C)  SpO2: 100% 100%   Vitals:   01/16/21 0826 01/16/21 2034 01/17/21 0500 01/17/21 0800  BP: (!) 110/56 (!) 114/55  (!) 125/58  Pulse: 74 64  77  Resp: 18   17  Temp: 98.9 F (37.2 C) 98 F (36.7 C)  98.2 F (36.8 C)  TempSrc: Oral Oral  Oral  SpO2: 99% 100%  100%  Weight:   63.6 kg   Height:        General exam: Appears calm and comfortable.    The results of significant diagnostics from this hospitalization (including imaging, microbiology, ancillary and laboratory) are listed below for reference.     Procedures and Diagnostic Studies:   DG Ankle  Complete Right  Result Date: 01/08/2021 CLINICAL DATA:  Status post fall. EXAM: RIGHT ANKLE - COMPLETE 3+ VIEW COMPARISON:  None. FINDINGS: There is no evidence of fracture, dislocation, or joint effusion. There is a moderate-sized plantar calcaneal spur. Degenerative changes are seen within the visualized portion of the mid right foot. Mild diffuse soft tissue swelling is seen. IMPRESSION: 1. Diffuse soft tissue swelling without an acute osseous abnormality. Electronically Signed   By: Virgina Norfolk M.D.   On: 01/08/2021 01:18   CT KNEE LEFT WO CONTRAST  Result Date: 01/09/2021 CLINICAL DATA:  Distal femur periprosthetic fracture after fall. EXAM: CT OF THE LEFT KNEE WITHOUT CONTRAST TECHNIQUE: Multidetector CT imaging of the left knee was performed according to the standard protocol. Multiplanar CT image reconstructions were also generated. COMPARISON:  Left knee x-rays from yesterday. FINDINGS: Bones/Joint/Cartilage Again seen is an acute impacted and displaced fracture of the distal femoral metaphysis there is up to 1.9 cm of medial displacement and 4.5 cm of impaction. No dislocation. Prior left total knee arthroplasty. No evidence of hardware failure or loosening. Lipohemarthrosis. Osteopenia. Ligaments Ligaments are suboptimally evaluated by CT. Muscles and Tendons Grossly intact. Soft tissue Soft tissue hematoma around the fracture site. IMPRESSION: 1. Acute impacted and displaced distal femoral periprosthetic fracture as described above. Electronically Signed   By: Titus Dubin M.D.   On: 01/09/2021 18:41   DG Knee Complete 4 Views Left  Result Date: 01/08/2021 CLINICAL DATA:  Status post fall. EXAM: LEFT KNEE - COMPLETE 4+ VIEW COMPARISON:  None. FINDINGS: A left knee replacement is seen. An acute, impacted fracture deformity of the distal left femur is also noted. This extends to the level of the distal femoral prosthesis. There is no evidence of dislocation. There is a moderate sized  joint effusion. Moderate severity soft tissue swelling is seen adjacent to the previously noted fracture site. IMPRESSION: 1. Left knee replacement with an acute, impacted fracture of the distal left femur extending to the level of the distal femoral prosthesis. 2. Moderate sized joint effusion with moderate severity soft tissue swelling. Electronically Signed   By: Virgina Norfolk M.D.   On: 01/08/2021 01:16   DG HIP PORT UNILAT WITH PELVIS 1V LEFT  Result Date: 01/09/2021 CLINICAL DATA:  Femur fracture EXAM: DG HIP (WITH OR WITHOUT PELVIS) 1V PORT LEFT COMPARISON:  CT 01/09/2021, radiograph 01/08/2021 FINDINGS: Pubic symphysis and rami appear intact. There are degenerative changes of both hips. No fracture or malalignment of the proximal femurs. IMPRESSION: No acute osseous abnormality. Electronically Signed   By: Donavan Foil M.D.   On: 01/09/2021 23:38     Labs:   Basic Metabolic Panel: Recent Labs  Lab 01/12/21 0047 01/13/21 0207 01/14/21 0113 01/15/21 0125 01/16/21 0151 01/17/21 0500  NA 134* 135 139 139 136 138  K 3.6 3.0* 4.0 4.6 3.5 3.5  CL 97* 97* 104 103 105  108  CO2 25 28 21* 21* 22 22  GLUCOSE 76 110* 87 134* 95 92  BUN 11 18 14  34* 41* 32*  CREATININE 0.77 1.01* 0.88 2.01* 1.71* 1.18*  CALCIUM 9.2 9.4 9.4 8.6* 7.9* 8.3*  MG 2.1 2.2  --   --   --   --    GFR Estimated Creatinine Clearance: 27.9 mL/min (A) (by C-G formula based on SCr of 1.18 mg/dL (H)). Liver Function Tests: No results for input(s): AST, ALT, ALKPHOS, BILITOT, PROT, ALBUMIN in the last 168 hours. No results for input(s): LIPASE, AMYLASE in the last 168 hours. No results for input(s): AMMONIA in the last 168 hours. Coagulation profile Recent Labs  Lab 01/10/21 1036  INR 1.2    CBC: Recent Labs  Lab 01/10/21 1036 01/11/21 0753 01/13/21 0207 01/14/21 0113 01/15/21 0125 01/16/21 0151 01/17/21 0500  WBC 7.4   < > 10.8* 11.7* 15.6* 13.1* 13.4*  NEUTROABS 5.7  --   --   --   --   --   --    HGB 7.9*   < > 12.3 12.2 10.6* 8.4* 8.3*  HCT 24.2*   < > 36.9 35.9* 32.0* 26.1* 26.1*  MCV 87.7   < > 88.9 90.9 94.1 93.9 95.3  PLT 224   < > 296 298 282 210 225   < > = values in this interval not displayed.   Cardiac Enzymes: No results for input(s): CKTOTAL, CKMB, CKMBINDEX, TROPONINI in the last 168 hours. BNP: Invalid input(s): POCBNP CBG: No results for input(s): GLUCAP in the last 168 hours. D-Dimer No results for input(s): DDIMER in the last 72 hours. Hgb A1c No results for input(s): HGBA1C in the last 72 hours. Lipid Profile No results for input(s): CHOL, HDL, LDLCALC, TRIG, CHOLHDL, LDLDIRECT in the last 72 hours. Thyroid function studies No results for input(s): TSH, T4TOTAL, T3FREE, THYROIDAB in the last 72 hours.  Invalid input(s): FREET3 Anemia work up No results for input(s): VITAMINB12, FOLATE, FERRITIN, TIBC, IRON, RETICCTPCT in the last 72 hours. Microbiology Recent Results (from the past 240 hour(s))  Resp Panel by RT-PCR (Flu A&B, Covid) Nasopharyngeal Swab     Status: None   Collection Time: 01/08/21  2:27 AM   Specimen: Nasopharyngeal Swab; Nasopharyngeal(NP) swabs in vial transport medium  Result Value Ref Range Status   SARS Coronavirus 2 by RT PCR NEGATIVE NEGATIVE Final    Comment: (NOTE) SARS-CoV-2 target nucleic acids are NOT DETECTED.  The SARS-CoV-2 RNA is generally detectable in upper respiratory specimens during the acute phase of infection. The lowest concentration of SARS-CoV-2 viral copies this assay can detect is 138 copies/mL. A negative result does not preclude SARS-Cov-2 infection and should not be used as the sole basis for treatment or other patient management decisions. A negative result may occur with  improper specimen collection/handling, submission of specimen other than nasopharyngeal swab, presence of viral mutation(s) within the areas targeted by this assay, and inadequate number of viral copies(<138 copies/mL). A negative  result must be combined with clinical observations, patient history, and epidemiological information. The expected result is Negative.  Fact Sheet for Patients:  EntrepreneurPulse.com.au  Fact Sheet for Healthcare Providers:  IncredibleEmployment.be  This test is no t yet approved or cleared by the [place] FDA and  has been authorized for detection and/or diagnosis of SARS-CoV-2 by FDA under an Emergency Use Authorization (EUA). This EUA will remain  in effect (meaning this test can be used) for the duration of the  COVID-19 declaration under Section 564(b)(1) of the Act, 21 U.S.C.section 360bbb-3(b)(1), unless the authorization is terminated  or revoked sooner.       Influenza A by PCR NEGATIVE NEGATIVE Final   Influenza B by PCR NEGATIVE NEGATIVE Final    Comment: (NOTE) The Xpert Xpress SARS-CoV-2/FLU/RSV plus assay is intended as an aid in the diagnosis of influenza from Nasopharyngeal swab specimens and should not be used as a sole basis for treatment. Nasal washings and aspirates are unacceptable for Xpert Xpress SARS-CoV-2/FLU/RSV testing.  Fact Sheet for Patients: EntrepreneurPulse.com.au  Fact Sheet for Healthcare Providers: IncredibleEmployment.be  This test is not yet approved or cleared by the [place] FDA and has been authorized for detection and/or diagnosis of SARS-CoV-2 by FDA under an Emergency Use Authorization (EUA). This EUA will remain in effect (meaning this test can be used) for the duration of the COVID-19 declaration under Section 564(b)(1) of the Act, 21 U.S.C. section 360bbb-3(b)(1), unless the authorization is terminated or revoked.  Performed at [facility], [address] [place], [place] [address]   Surgical pcr screen     Status: None   Collection Time: 01/11/21 11:18 AM   Specimen: Nasal Mucosa; Nasal Swab  Result Value Ref Range Status   MRSA, PCR  NEGATIVE NEGATIVE Final   Staphylococcus aureus NEGATIVE NEGATIVE Final    Comment: (NOTE) The Xpert SA Assay (FDA approved for NASAL specimens in patients 23 years of age and older), is one component of a comprehensive surveillance program. It is not intended to diagnose infection nor to guide or monitor treatment. Performed at [facility], [place] [address] [place], [place] [address]   SARS CORONAVIRUS 2 (TAT 6-24 HRS) Nasopharyngeal Nasopharyngeal Swab     Status: None   Collection Time: 01/16/21  2:50 PM   Specimen: Nasopharyngeal Swab  Result Value Ref Range Status   SARS Coronavirus 2 NEGATIVE NEGATIVE Final    Comment: (NOTE) SARS-CoV-2 target nucleic acids are NOT DETECTED.  The SARS-CoV-2 RNA is generally detectable in upper and lower respiratory specimens during the acute phase of infection. Negative results do not preclude SARS-CoV-2 infection, do not rule out co-infections with other pathogens, and should not be used as the sole basis for treatment or other patient management decisions. Negative results must be combined with clinical observations, patient history, and epidemiological information. The expected result is Negative.  Fact Sheet for Patients: SugarRoll.be  Fact Sheet for Healthcare Providers: https://www.woods-mathews.com/  This test is not yet approved or cleared by the [place] FDA and  has been authorized for detection and/or diagnosis of SARS-CoV-2 by FDA under an Emergency Use Authorization (EUA). This EUA will remain  in effect (meaning this test can be used) for the duration of the COVID-19 declaration under Se ction 564(b)(1) of the Act, 21 U.S.C. section 360bbb-3(b)(1), unless the authorization is terminated or revoked sooner.  Performed at [facility], [place] [address] [place],  [address]      Discharge Instructions:   Discharge Instructions    Discharge wound care:    Complete by: As directed    Reinforce dressing per ortho   Increase activity slowly   Complete by: As directed      Allergies as of 01/17/2021      Reactions   Amoxicillin Other (See Comments)   Burning sensation.   Penicillins Other (See Comments)   Burning sensation.      Medication List    STOP taking these medications  ALPRAZolam 0.5 MG tablet Commonly known as: XANAX   cilostazol 100 MG tablet Commonly known as: PLETAL   felodipine 10 MG 24 hr tablet Commonly known as: PLENDIL   furosemide 40 MG tablet Commonly known as: LASIX   ibuprofen 400 MG tablet Commonly known as: ADVIL   metroNIDAZOLE 500 MG tablet Commonly known as: FLAGYL   potassium chloride SA 20 MEQ tablet Commonly known as: KLOR-CON   saccharomyces boulardii 250 MG capsule Commonly known as: FLORASTOR     TAKE these medications   acetaminophen 325 MG tablet Commonly known as: TYLENOL Take 2 tablets (650 mg total) by mouth every 6 (six) hours as needed for mild pain (or Fever >/= 101).   aspirin 81 MG chewable tablet Chew 1 tablet (81 mg total) by mouth 2 (two) times daily with a meal. What changed: when to take this   cyanocobalamin 1000 MCG tablet Take 1 tablet (1,000 mcg total) by mouth daily.   docusate sodium 100 MG capsule Commonly known as: COLACE Take 1 capsule (100 mg total) by mouth 2 (two) times daily.   feeding supplement Liqd Take 237 mLs by mouth 3 (three) times daily between meals.   HYDROcodone-acetaminophen 5-325 MG tablet Commonly known as: NORCO/VICODIN Take 1 tablet by mouth every 4 (four) hours as needed for moderate pain. What changed:   how much to take  when to take this  reasons to take this   lansoprazole 30 MG capsule Commonly known as: PREVACID Take 30 mg by mouth daily.   levothyroxine 88 MCG tablet Commonly known as: SYNTHROID Take 1 tablet (88 mcg total) by mouth daily before breakfast. Start taking on: January 18, 2021 What changed:    medication strength  how much to take   LORazepam 0.5 MG tablet Commonly known as: ATIVAN Take 1 tablet (0.5 mg total) by mouth every 6 (six) hours as needed for anxiety. What changed:   when to take this  reasons to take this   metoprolol tartrate 25 MG tablet Commonly known as: LOPRESSOR Take 1 tablet (25 mg total) by mouth 2 (two) times daily. What changed:   medication strength  how much to take            Discharge Care Instructions  (From admission, onward)         Start     Ordered   01/17/21 0000  Discharge wound care:       Comments: Reinforce dressing per ortho   01/17/21 0837          Contact information for follow-up providers    Swinteck, Aaron Edelman, MD. Schedule an appointment as soon as possible for a visit in 2 weeks.   Specialty: Orthopedic Surgery Why: For wound re-check, For suture removal Contact information: [address] [place] [address] [phone]        Rosita Fire, MD Follow up in 1 week(s).   Specialty: Internal Medicine Contact information: [place] [address] [phone]            Contact information for after-discharge care    [facility] .   Service: Skilled Nursing Contact information: [address] [place] [phone]                   Time coordinating discharge: 35 min  Signed:  Geradine Girt DO  [facility] 01/17/2021, 8:37 AM

## 2021-01-17 NOTE — Progress Notes (Addendum)
I tried giving report to [facility] at [place], did not answer. Left knee incision with Aquacel dressing dry and intact. Hemovac was removed by the ortho PA.  Waiting for PTAR. Discharged pt via [place], pt's daughter was here.

## 2021-02-02 ENCOUNTER — Encounter (HOSPITAL_COMMUNITY): Payer: Self-pay | Admitting: Orthopedic Surgery

## 2021-02-25 ENCOUNTER — Emergency Department (HOSPITAL_COMMUNITY): Payer: Medicare Other

## 2021-02-25 ENCOUNTER — Inpatient Hospital Stay (HOSPITAL_COMMUNITY)
Admission: EM | Admit: 2021-02-25 | Discharge: 2021-04-18 | DRG: 485 | Disposition: E | Payer: Medicare Other | Attending: Emergency Medicine | Admitting: Emergency Medicine

## 2021-02-25 ENCOUNTER — Other Ambulatory Visit: Payer: Self-pay

## 2021-02-25 DIAGNOSIS — B952 Enterococcus as the cause of diseases classified elsewhere: Secondary | ICD-10-CM | POA: Diagnosis not present

## 2021-02-25 DIAGNOSIS — Z7401 Bed confinement status: Secondary | ICD-10-CM

## 2021-02-25 DIAGNOSIS — T8459XS Infection and inflammatory reaction due to other internal joint prosthesis, sequela: Secondary | ICD-10-CM | POA: Diagnosis not present

## 2021-02-25 DIAGNOSIS — J9621 Acute and chronic respiratory failure with hypoxia: Secondary | ICD-10-CM | POA: Diagnosis not present

## 2021-02-25 DIAGNOSIS — D6489 Other specified anemias: Secondary | ICD-10-CM | POA: Diagnosis present

## 2021-02-25 DIAGNOSIS — I132 Hypertensive heart and chronic kidney disease with heart failure and with stage 5 chronic kidney disease, or end stage renal disease: Secondary | ICD-10-CM | POA: Diagnosis present

## 2021-02-25 DIAGNOSIS — A419 Sepsis, unspecified organism: Secondary | ICD-10-CM | POA: Diagnosis present

## 2021-02-25 DIAGNOSIS — J9622 Acute and chronic respiratory failure with hypercapnia: Secondary | ICD-10-CM | POA: Diagnosis not present

## 2021-02-25 DIAGNOSIS — L89154 Pressure ulcer of sacral region, stage 4: Secondary | ICD-10-CM | POA: Diagnosis present

## 2021-02-25 DIAGNOSIS — G9341 Metabolic encephalopathy: Secondary | ICD-10-CM | POA: Diagnosis present

## 2021-02-25 DIAGNOSIS — G931 Anoxic brain damage, not elsewhere classified: Secondary | ICD-10-CM | POA: Diagnosis not present

## 2021-02-25 DIAGNOSIS — J9600 Acute respiratory failure, unspecified whether with hypoxia or hypercapnia: Secondary | ICD-10-CM

## 2021-02-25 DIAGNOSIS — M4628 Osteomyelitis of vertebra, sacral and sacrococcygeal region: Secondary | ICD-10-CM | POA: Diagnosis present

## 2021-02-25 DIAGNOSIS — R6521 Severe sepsis with septic shock: Secondary | ICD-10-CM | POA: Diagnosis present

## 2021-02-25 DIAGNOSIS — I700 Atherosclerosis of aorta: Secondary | ICD-10-CM | POA: Diagnosis present

## 2021-02-25 DIAGNOSIS — R6520 Severe sepsis without septic shock: Secondary | ICD-10-CM | POA: Diagnosis not present

## 2021-02-25 DIAGNOSIS — Z85828 Personal history of other malignant neoplasm of skin: Secondary | ICD-10-CM

## 2021-02-25 DIAGNOSIS — K2970 Gastritis, unspecified, without bleeding: Secondary | ICD-10-CM | POA: Diagnosis present

## 2021-02-25 DIAGNOSIS — T8454XA Infection and inflammatory reaction due to internal left knee prosthesis, initial encounter: Secondary | ICD-10-CM | POA: Diagnosis present

## 2021-02-25 DIAGNOSIS — N390 Urinary tract infection, site not specified: Secondary | ICD-10-CM | POA: Diagnosis present

## 2021-02-25 DIAGNOSIS — E039 Hypothyroidism, unspecified: Secondary | ICD-10-CM | POA: Diagnosis present

## 2021-02-25 DIAGNOSIS — E860 Dehydration: Secondary | ICD-10-CM | POA: Diagnosis not present

## 2021-02-25 DIAGNOSIS — Z5181 Encounter for therapeutic drug level monitoring: Secondary | ICD-10-CM

## 2021-02-25 DIAGNOSIS — E870 Hyperosmolality and hypernatremia: Secondary | ICD-10-CM | POA: Diagnosis not present

## 2021-02-25 DIAGNOSIS — E1122 Type 2 diabetes mellitus with diabetic chronic kidney disease: Secondary | ICD-10-CM | POA: Diagnosis present

## 2021-02-25 DIAGNOSIS — E8809 Other disorders of plasma-protein metabolism, not elsewhere classified: Secondary | ICD-10-CM | POA: Diagnosis not present

## 2021-02-25 DIAGNOSIS — E43 Unspecified severe protein-calorie malnutrition: Secondary | ICD-10-CM | POA: Diagnosis present

## 2021-02-25 DIAGNOSIS — R7989 Other specified abnormal findings of blood chemistry: Secondary | ICD-10-CM | POA: Diagnosis present

## 2021-02-25 DIAGNOSIS — D72829 Elevated white blood cell count, unspecified: Secondary | ICD-10-CM | POA: Diagnosis not present

## 2021-02-25 DIAGNOSIS — T368X5A Adverse effect of other systemic antibiotics, initial encounter: Secondary | ICD-10-CM | POA: Diagnosis present

## 2021-02-25 DIAGNOSIS — Z79899 Other long term (current) drug therapy: Secondary | ICD-10-CM

## 2021-02-25 DIAGNOSIS — J9811 Atelectasis: Secondary | ICD-10-CM | POA: Diagnosis not present

## 2021-02-25 DIAGNOSIS — R410 Disorientation, unspecified: Secondary | ICD-10-CM | POA: Diagnosis not present

## 2021-02-25 DIAGNOSIS — D62 Acute posthemorrhagic anemia: Secondary | ICD-10-CM | POA: Diagnosis present

## 2021-02-25 DIAGNOSIS — Z9049 Acquired absence of other specified parts of digestive tract: Secondary | ICD-10-CM

## 2021-02-25 DIAGNOSIS — Z0189 Encounter for other specified special examinations: Secondary | ICD-10-CM

## 2021-02-25 DIAGNOSIS — F0390 Unspecified dementia without behavioral disturbance: Secondary | ICD-10-CM | POA: Diagnosis present

## 2021-02-25 DIAGNOSIS — L89529 Pressure ulcer of left ankle, unspecified stage: Secondary | ICD-10-CM | POA: Diagnosis present

## 2021-02-25 DIAGNOSIS — R531 Weakness: Secondary | ICD-10-CM

## 2021-02-25 DIAGNOSIS — R4182 Altered mental status, unspecified: Secondary | ICD-10-CM | POA: Diagnosis not present

## 2021-02-25 DIAGNOSIS — F05 Delirium due to known physiological condition: Secondary | ICD-10-CM | POA: Diagnosis present

## 2021-02-25 DIAGNOSIS — Z880 Allergy status to penicillin: Secondary | ICD-10-CM

## 2021-02-25 DIAGNOSIS — Y831 Surgical operation with implant of artificial internal device as the cause of abnormal reaction of the patient, or of later complication, without mention of misadventure at the time of the procedure: Secondary | ICD-10-CM | POA: Diagnosis present

## 2021-02-25 DIAGNOSIS — L8990 Pressure ulcer of unspecified site, unspecified stage: Secondary | ICD-10-CM | POA: Diagnosis present

## 2021-02-25 DIAGNOSIS — K219 Gastro-esophageal reflux disease without esophagitis: Secondary | ICD-10-CM | POA: Diagnosis not present

## 2021-02-25 DIAGNOSIS — D649 Anemia, unspecified: Secondary | ICD-10-CM | POA: Diagnosis not present

## 2021-02-25 DIAGNOSIS — E875 Hyperkalemia: Secondary | ICD-10-CM | POA: Diagnosis not present

## 2021-02-25 DIAGNOSIS — Z515 Encounter for palliative care: Secondary | ICD-10-CM

## 2021-02-25 DIAGNOSIS — E538 Deficiency of other specified B group vitamins: Secondary | ICD-10-CM | POA: Diagnosis present

## 2021-02-25 DIAGNOSIS — R1319 Other dysphagia: Secondary | ICD-10-CM | POA: Diagnosis not present

## 2021-02-25 DIAGNOSIS — E871 Hypo-osmolality and hyponatremia: Secondary | ICD-10-CM | POA: Diagnosis present

## 2021-02-25 DIAGNOSIS — L89514 Pressure ulcer of right ankle, stage 4: Secondary | ICD-10-CM | POA: Diagnosis present

## 2021-02-25 DIAGNOSIS — I5032 Chronic diastolic (congestive) heart failure: Secondary | ICD-10-CM | POA: Diagnosis present

## 2021-02-25 DIAGNOSIS — Z981 Arthrodesis status: Secondary | ICD-10-CM

## 2021-02-25 DIAGNOSIS — Z1611 Resistance to penicillins: Secondary | ICD-10-CM | POA: Diagnosis present

## 2021-02-25 DIAGNOSIS — G928 Other toxic encephalopathy: Secondary | ICD-10-CM | POA: Diagnosis present

## 2021-02-25 DIAGNOSIS — I2699 Other pulmonary embolism without acute cor pulmonale: Secondary | ICD-10-CM

## 2021-02-25 DIAGNOSIS — Z4659 Encounter for fitting and adjustment of other gastrointestinal appliance and device: Secondary | ICD-10-CM

## 2021-02-25 DIAGNOSIS — I5031 Acute diastolic (congestive) heart failure: Secondary | ICD-10-CM | POA: Diagnosis not present

## 2021-02-25 DIAGNOSIS — Z20822 Contact with and (suspected) exposure to covid-19: Secondary | ICD-10-CM | POA: Diagnosis present

## 2021-02-25 DIAGNOSIS — B9689 Other specified bacterial agents as the cause of diseases classified elsewhere: Secondary | ICD-10-CM | POA: Diagnosis not present

## 2021-02-25 DIAGNOSIS — L03116 Cellulitis of left lower limb: Secondary | ICD-10-CM | POA: Diagnosis present

## 2021-02-25 DIAGNOSIS — I480 Paroxysmal atrial fibrillation: Secondary | ICD-10-CM | POA: Diagnosis not present

## 2021-02-25 DIAGNOSIS — E1169 Type 2 diabetes mellitus with other specified complication: Secondary | ICD-10-CM | POA: Diagnosis present

## 2021-02-25 DIAGNOSIS — T8459XA Infection and inflammatory reaction due to other internal joint prosthesis, initial encounter: Secondary | ICD-10-CM

## 2021-02-25 DIAGNOSIS — Z96659 Presence of unspecified artificial knee joint: Secondary | ICD-10-CM

## 2021-02-25 DIAGNOSIS — I739 Peripheral vascular disease, unspecified: Secondary | ICD-10-CM | POA: Diagnosis present

## 2021-02-25 DIAGNOSIS — E785 Hyperlipidemia, unspecified: Secondary | ICD-10-CM | POA: Diagnosis present

## 2021-02-25 DIAGNOSIS — R627 Adult failure to thrive: Secondary | ICD-10-CM | POA: Diagnosis present

## 2021-02-25 DIAGNOSIS — M479 Spondylosis, unspecified: Secondary | ICD-10-CM | POA: Diagnosis present

## 2021-02-25 DIAGNOSIS — M71162 Other infective bursitis, left knee: Secondary | ICD-10-CM | POA: Diagnosis not present

## 2021-02-25 DIAGNOSIS — E1151 Type 2 diabetes mellitus with diabetic peripheral angiopathy without gangrene: Secondary | ICD-10-CM | POA: Diagnosis present

## 2021-02-25 DIAGNOSIS — R1310 Dysphagia, unspecified: Secondary | ICD-10-CM | POA: Diagnosis not present

## 2021-02-25 DIAGNOSIS — Z66 Do not resuscitate: Secondary | ICD-10-CM | POA: Diagnosis not present

## 2021-02-25 DIAGNOSIS — N179 Acute kidney failure, unspecified: Secondary | ICD-10-CM | POA: Diagnosis present

## 2021-02-25 DIAGNOSIS — Z90710 Acquired absence of both cervix and uterus: Secondary | ICD-10-CM

## 2021-02-25 DIAGNOSIS — N189 Chronic kidney disease, unspecified: Secondary | ICD-10-CM | POA: Diagnosis present

## 2021-02-25 DIAGNOSIS — Z7902 Long term (current) use of antithrombotics/antiplatelets: Secondary | ICD-10-CM

## 2021-02-25 DIAGNOSIS — R4701 Aphasia: Secondary | ICD-10-CM | POA: Diagnosis present

## 2021-02-25 DIAGNOSIS — D509 Iron deficiency anemia, unspecified: Secondary | ICD-10-CM | POA: Diagnosis present

## 2021-02-25 DIAGNOSIS — M7989 Other specified soft tissue disorders: Secondary | ICD-10-CM

## 2021-02-25 DIAGNOSIS — E876 Hypokalemia: Secondary | ICD-10-CM | POA: Diagnosis present

## 2021-02-25 DIAGNOSIS — Z7189 Other specified counseling: Secondary | ICD-10-CM | POA: Diagnosis not present

## 2021-02-25 DIAGNOSIS — I469 Cardiac arrest, cause unspecified: Secondary | ICD-10-CM | POA: Diagnosis not present

## 2021-02-25 DIAGNOSIS — R52 Pain, unspecified: Secondary | ICD-10-CM

## 2021-02-25 DIAGNOSIS — I10 Essential (primary) hypertension: Secondary | ICD-10-CM | POA: Diagnosis present

## 2021-02-25 DIAGNOSIS — Z79890 Hormone replacement therapy (postmenopausal): Secondary | ICD-10-CM

## 2021-02-25 DIAGNOSIS — J9601 Acute respiratory failure with hypoxia: Secondary | ICD-10-CM | POA: Diagnosis not present

## 2021-02-25 DIAGNOSIS — Z01818 Encounter for other preprocedural examination: Secondary | ICD-10-CM

## 2021-02-25 DIAGNOSIS — I2510 Atherosclerotic heart disease of native coronary artery without angina pectoris: Secondary | ICD-10-CM | POA: Diagnosis present

## 2021-02-25 DIAGNOSIS — D631 Anemia in chronic kidney disease: Secondary | ICD-10-CM | POA: Diagnosis present

## 2021-02-25 DIAGNOSIS — R1312 Dysphagia, oropharyngeal phase: Secondary | ICD-10-CM | POA: Diagnosis not present

## 2021-02-25 LAB — CBC WITH DIFFERENTIAL/PLATELET
Abs Immature Granulocytes: 0.45 10*3/uL — ABNORMAL HIGH (ref 0.00–0.07)
Basophils Absolute: 0 10*3/uL (ref 0.0–0.1)
Basophils Relative: 0 %
Eosinophils Absolute: 0.1 10*3/uL (ref 0.0–0.5)
Eosinophils Relative: 0 %
HCT: 31.9 % — ABNORMAL LOW (ref 36.0–46.0)
Hemoglobin: 10.2 g/dL — ABNORMAL LOW (ref 12.0–15.0)
Immature Granulocytes: 3 %
Lymphocytes Relative: 5 %
Lymphs Abs: 0.9 10*3/uL (ref 0.7–4.0)
MCH: 30.3 pg (ref 26.0–34.0)
MCHC: 32 g/dL (ref 30.0–36.0)
MCV: 94.7 fL (ref 80.0–100.0)
Monocytes Absolute: 0.7 10*3/uL (ref 0.1–1.0)
Monocytes Relative: 4 %
Neutro Abs: 15.1 10*3/uL — ABNORMAL HIGH (ref 1.7–7.7)
Neutrophils Relative %: 88 %
Platelets: 375 10*3/uL (ref 150–400)
RBC: 3.37 MIL/uL — ABNORMAL LOW (ref 3.87–5.11)
RDW: 15 % (ref 11.5–15.5)
WBC: 17.2 10*3/uL — ABNORMAL HIGH (ref 4.0–10.5)
nRBC: 0 % (ref 0.0–0.2)

## 2021-02-25 LAB — BLOOD GAS, VENOUS
Acid-Base Excess: 3.3 mmol/L — ABNORMAL HIGH (ref 0.0–2.0)
Bicarbonate: 26.2 mmol/L (ref 20.0–28.0)
FIO2: 21
O2 Saturation: 55.4 %
Patient temperature: 36.6
pCO2, Ven: 47.9 mmHg (ref 44.0–60.0)
pH, Ven: 7.384 (ref 7.250–7.430)
pO2, Ven: 34.3 mmHg (ref 32.0–45.0)

## 2021-02-25 LAB — HEPATIC FUNCTION PANEL
ALT: 9 U/L (ref 0–44)
AST: 14 U/L — ABNORMAL LOW (ref 15–41)
Albumin: 2.3 g/dL — ABNORMAL LOW (ref 3.5–5.0)
Alkaline Phosphatase: 80 U/L (ref 38–126)
Bilirubin, Direct: 0.2 mg/dL (ref 0.0–0.2)
Indirect Bilirubin: 0.5 mg/dL (ref 0.3–0.9)
Total Bilirubin: 0.7 mg/dL (ref 0.3–1.2)
Total Protein: 7.1 g/dL (ref 6.5–8.1)

## 2021-02-25 LAB — BASIC METABOLIC PANEL
Anion gap: 12 (ref 5–15)
BUN: 72 mg/dL — ABNORMAL HIGH (ref 8–23)
CO2: 25 mmol/L (ref 22–32)
Calcium: 8.6 mg/dL — ABNORMAL LOW (ref 8.9–10.3)
Chloride: 92 mmol/L — ABNORMAL LOW (ref 98–111)
Creatinine, Ser: 2.53 mg/dL — ABNORMAL HIGH (ref 0.44–1.00)
GFR, Estimated: 18 mL/min — ABNORMAL LOW (ref >60)
Glucose, Bld: 89 mg/dL (ref 70–99)
Potassium: 4.5 mmol/L (ref 3.5–5.1)
Sodium: 129 mmol/L — ABNORMAL LOW (ref 135–145)

## 2021-02-25 LAB — RESP PANEL BY RT-PCR (FLU A&B, COVID) ARPGX2
Influenza A by PCR: NEGATIVE
Influenza B by PCR: NEGATIVE
SARS Coronavirus 2 by RT PCR: NEGATIVE

## 2021-02-25 LAB — PROTIME-INR
INR: 1.2 (ref 0.8–1.2)
Prothrombin Time: 14.9 seconds (ref 11.4–15.2)

## 2021-02-25 LAB — TROPONIN I (HIGH SENSITIVITY)
Troponin I (High Sensitivity): 9 ng/L (ref <18)
Troponin I (High Sensitivity): 8 ng/L (ref <18)

## 2021-02-25 LAB — TSH: TSH: 4.023 u[IU]/mL (ref 0.350–4.500)

## 2021-02-25 LAB — LACTIC ACID, PLASMA
Lactic Acid, Venous: 1.4 mmol/L (ref 0.5–1.9)
Lactic Acid, Venous: 0.9 mmol/L (ref 0.5–1.9)

## 2021-02-25 LAB — APTT: aPTT: 29 seconds (ref 24–36)

## 2021-02-25 LAB — CBG MONITORING, ED: Glucose-Capillary: 84 mg/dL (ref 70–99)

## 2021-02-25 LAB — BRAIN NATRIURETIC PEPTIDE: B Natriuretic Peptide: 400 pg/mL — ABNORMAL HIGH (ref 0.0–100.0)

## 2021-02-25 MED ORDER — SODIUM CHLORIDE 0.9 % IV BOLUS
1000.0000 mL | Freq: Once | INTRAVENOUS | Status: AC
  Administered 2021-02-25: 1000 mL via INTRAVENOUS

## 2021-02-25 MED ORDER — VANCOMYCIN HCL IN DEXTROSE 1-5 GM/200ML-% IV SOLN
1000.0000 mg | Freq: Once | INTRAVENOUS | Status: AC
  Administered 2021-02-26: 1000 mg via INTRAVENOUS
  Filled 2021-02-25: qty 200

## 2021-02-25 MED ORDER — VANCOMYCIN HCL 750 MG/150ML IV SOLN
750.0000 mg | INTRAVENOUS | Status: DC
  Administered 2021-02-27 – 2021-03-01 (×2): 750 mg via INTRAVENOUS
  Filled 2021-02-25 (×2): qty 150

## 2021-02-25 MED ORDER — SODIUM CHLORIDE 0.9 % IV SOLN
2.0000 g | INTRAVENOUS | Status: DC
  Administered 2021-02-26 – 2021-03-01 (×5): 2 g via INTRAVENOUS
  Filled 2021-02-25 (×5): qty 2

## 2021-02-25 NOTE — ED Triage Notes (Addendum)
ccems from home with family complaints of increased weakness, decreased appetite since Wednesday. Recent knee replacement  20g r ac started by ems

## 2021-02-25 NOTE — ED Notes (Signed)
X-Ray at bedside.

## 2021-02-25 NOTE — ED Provider Notes (Incomplete)
[facility] EMERGENCY DEPARTMENT Provider Note   CSN: [id number] Arrival date & time: 03/15/2021  1921     History Chief Complaint  Patient presents with  . Weakness    [name] is a 87 y.o. female    HPI     Past Medical History:  Diagnosis Date  . Arthritis   . Gastritis, Helicobacter pylori    Treated with Pylera, 2010  . Hypertension   . Hypothyroidism     Patient Active Problem List   Diagnosis Date Noted  . 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 Diarrhea 05/23/2013  . Hypothyroidism 05/10/2013  . HYPERTHYROIDISM 12/23/2006  . HYPERLIPIDEMIA 12/23/2006  . MIGRAINE HEADACHE 12/23/2006  . CATARACT NOS 12/23/2006  . Essential hypertension 12/23/2006  . PERIPHERAL VASCULAR DISEASE 12/23/2006  . ALLERGIC RHINITIS 12/23/2006  . COPD 12/23/2006  . GERD 12/23/2006  . LOW BACK PAIN 12/23/2006  . CARDIAC MURMUR 12/23/2006  . HYPERGLYCEMIA 12/23/2006  . FRACTURE, WRIST 12/23/2006    Past Surgical History:  Procedure Laterality Date  . ABDOMINAL HYSTERECTOMY    . CARPAL TUNNEL RELEASE    . CERVICAL FUSION    . COLON SURGERY  2010   Dr. Georgette Dover: secondary to cecal mass. Negative for malignancy  . COLONOSCOPY  May 2010   Dr. Oneida Alar: large cecal mass, referred to Dr. Georgette Dover  . ESOPHAGOGASTRODUODENOSCOPY  May 2010   normal esophagus, no Barrett's, moderate gastritis, +H.PYLORI  . JOINT REPLACEMENT     bilateral knee replacement, left shoulder surgery  . TOTAL KNEE REVISION Left 01/14/2021   Procedure: LEFT DISTAL FEMUR REPLACEMENT;  Surgeon: Rod Can, MD;  Location: [facility];  Service: Orthopedics;  Laterality: Left;     OB History   No obstetric history on file.     Family History  Problem Relation Age of Onset  . Colon cancer Neg Hx     Social History   Tobacco Use  . Smoking status: Never Smoker  . Smokeless tobacco: Never Used  Substance Use Topics  . Alcohol use: No  . Drug use: No    Home  Medications Prior to Admission medications   Medication Sig Start Date End Date Taking? Authorizing Provider  acetaminophen (TYLENOL) 325 MG tablet Take 2 tablets (650 mg total) by mouth every 6 (six) hours as needed for mild pain (or Fever >/= 101). 01/17/21   Geradine Girt, DO  aspirin 81 MG chewable tablet Chew 1 tablet (81 mg total) by mouth 2 (two) times daily with a meal. 01/16/21 03/02/21  Cherlynn June B, PA  docusate sodium (COLACE) 100 MG capsule Take 1 capsule (100 mg total) by mouth 2 (two) times daily. 01/17/21   Geradine Girt, DO  feeding supplement (ENSURE ENLIVE / ENSURE PLUS) LIQD Take 237 mLs by mouth 3 (three) times daily between meals. 01/17/21   Geradine Girt, DO  HYDROcodone-acetaminophen (NORCO/VICODIN) 5-325 MG tablet Take 1 tablet by mouth every 4 (four) hours as needed for moderate pain. 01/16/21   Dorothyann Peng, PA  lansoprazole (PREVACID) 30 MG capsule Take 30 mg by mouth daily.    [provider]  levothyroxine (SYNTHROID) 88 MCG tablet Take 1 tablet (88 mcg total) by mouth daily before breakfast. 01/18/21   Geradine Girt, DO  LORazepam (ATIVAN) 0.5 MG tablet Take 1 tablet (0.5 mg total) by mouth every 6 (six) hours as needed for anxiety. 01/17/21   Geradine Girt, DO  metoprolol tartrate (LOPRESSOR) 25 MG  tablet Take 1 tablet (25 mg total) by mouth 2 (two) times daily. 01/17/21   Geradine Girt, DO  vitamin B-12 1000 MCG tablet Take 1 tablet (1,000 mcg total) by mouth daily. 01/17/21   Geradine Girt, DO    Allergies    Amoxicillin and Penicillins  Review of Systems   Review of Systems  Physical Exam Updated Vital Signs BP (!) 86/51   Pulse 86   Temp 98.1 F (36.7 C)   Resp (!) 28   Ht 5\' 4"  (1.626 m)   Wt 63.6 kg   SpO2 99%   BMI 24.07 kg/m   Physical Exam  ED Results / Procedures / Treatments   Labs (all labs ordered are listed, but only abnormal results are displayed) Labs Reviewed  CBG MONITORING, ED    EKG None  Radiology No  results found.  Procedures Procedures {Remember to document critical care time when appropriate:1}  Medications Ordered in ED Medications - No data to display  ED Course  I have reviewed the triage vital signs and the nursing notes.  Pertinent labs & imaging results that were available during my care of the patient were reviewed by me and considered in my medical decision making (see chart for details).    MDM Rules/Calculators/A&P                          *** Final Clinical Impression(s) / ED Diagnoses Final diagnoses:  None    Rx / DC Orders ED Discharge Orders    None

## 2021-02-25 NOTE — H&P (Signed)
History and Physical  [name] OZH:[id number] DOB: [birth year] DOA: 03/14/2021  Referring physician: Wyvonnia Dusky, MD PCP: Rosita Fire, MD  Patient coming from: Home  Chief Complaint: Generalized weakness and altered mental status  HPI: [name] is a 87 y.o. female with medical history significant for hypothyroidism, hypertension, PVD and arthritis who presents to the emergency department via EMS due to 4-day onset of confusion and weakness.  Patient was unable to provide a history, history was obtained from ED physician and ED medical record.  Per report, patient was unable to swallow within past 3 to 4 days, she presented with gagging and choking sensation on eating.  Apparently, patient was admitted to the hospital from 2/21-3/2 due to left distal femur fracture s/p left distal femoral replacement by Dr. Lyla Glassing, she was discharged to a SNF and was recently discharged home from the SNF.  Patient has not been getting out of bed because of her knee (that was just replaced).  Patient was said to have dementia which waxes and wane from day to day at baseline, but she can talk and describe her wants and needs.  ED Course: In the emergency department, she was intermittently tachypneic and BP was soft at 90/49.  Other vital signs were within normal range.  Work-up in the ED showed leukocytosis, BMP showed hyponatremia, BUN/creatinine 72/2.53 (baseline creatinine at 0.8-1.2).  Albumin 2.3, BNP 400.  SARS coronavirus 2, influenza A and B were negative. CT of head without contrast showed no acute abnormality Chest x-ray showed no acute abnormality She was provided with IV hydration and patient was started on IV Vanco and cefepime.  Hospitalist was asked to admit patient for further evaluation and management.  Review of Systems: This cannot be obtained at this time due to patient's current condition/underlying dementia  Past Medical History:  Diagnosis Date  . Arthritis   .  Gastritis, Helicobacter pylori    Treated with Pylera, 2010  . Hypertension   . Hypothyroidism    Past Surgical History:  Procedure Laterality Date  . ABDOMINAL HYSTERECTOMY    . CARPAL TUNNEL RELEASE    . CERVICAL FUSION    . COLON SURGERY  2010   Dr. Georgette Dover: secondary to cecal mass. Negative for malignancy  . COLONOSCOPY  May 2010   Dr. [place] Alar: large cecal mass, referred to Dr. Georgette Dover  . ESOPHAGOGASTRODUODENOSCOPY  May 2010   normal esophagus, no Barrett's, moderate gastritis, +H.PYLORI  . JOINT REPLACEMENT     bilateral knee replacement, left shoulder surgery  . TOTAL KNEE REVISION Left 01/14/2021   Procedure: LEFT DISTAL FEMUR REPLACEMENT;  Surgeon: Rod Can, MD;  Location: [facility];  Service: Orthopedics;  Laterality: Left;    Social History:  reports that she has never smoked. She has never used smokeless tobacco. She reports that she does not drink alcohol and does not use drugs.   Allergies  Allergen Reactions  . Amoxicillin Other (See Comments)    Burning sensation.  Marland Kitchen Penicillins Other (See Comments)    Burning sensation.    Family History  Problem Relation Age of Onset  . Colon cancer Neg Hx    Prior to Admission medications   Medication Sig Start Date End Date Taking? Authorizing Provider  acetaminophen (TYLENOL) 325 MG tablet Take 2 tablets (650 mg total) by mouth every 6 (six) hours as needed for mild pain (or Fever >/= 101). 01/17/21   Geradine Girt, DO  aspirin 81 MG chewable tablet Chew 1 tablet (  81 mg total) by mouth 2 (two) times daily with a meal. 01/16/21 03/02/21  Cherlynn June B, PA  docusate sodium (COLACE) 100 MG capsule Take 1 capsule (100 mg total) by mouth 2 (two) times daily. 01/17/21   Geradine Girt, DO  feeding supplement (ENSURE ENLIVE / ENSURE PLUS) LIQD Take 237 mLs by mouth 3 (three) times daily between meals. 01/17/21   Geradine Girt, DO  HYDROcodone-acetaminophen (NORCO/VICODIN) 5-325 MG tablet Take 1 tablet by mouth every 4 (four) hours  as needed for moderate pain. 01/16/21   Dorothyann Peng, PA  lansoprazole (PREVACID) 30 MG capsule Take 30 mg by mouth daily.    [provider]  levothyroxine (SYNTHROID) 88 MCG tablet Take 1 tablet (88 mcg total) by mouth daily before breakfast. 01/18/21   Geradine Girt, DO  LORazepam (ATIVAN) 0.5 MG tablet Take 1 tablet (0.5 mg total) by mouth every 6 (six) hours as needed for anxiety. 01/17/21   Geradine Girt, DO  metoprolol tartrate (LOPRESSOR) 25 MG tablet Take 1 tablet (25 mg total) by mouth 2 (two) times daily. 01/17/21   Geradine Girt, DO  vitamin B-12 1000 MCG tablet Take 1 tablet (1,000 mcg total) by mouth daily. 01/17/21   Geradine Girt, DO    Physical Exam: BP (!) 109/57 (BP Location: Left Arm)   Pulse 93   Temp 98.7 F (37.1 C) (Oral)   Resp 18   Ht 5' 4.02" (1.626 m)   Wt 61.2 kg   SpO2 94%   BMI 23.15 kg/m   . General: 87 y.o. year-old female ill-appearing but in no acute distress.  Alert and oriented x 1 (person). Marland Kitchen HEENT: Dry mucous membrane.  NCAT, EOMI . Neck: Supple, trachea medial . Cardiovascular: Regular rate and rhythm with no rubs or gallops.  No thyromegaly or JVD noted.  No lower extremity edema. 2/4 pulses in all 4 extremities. Marland Kitchen Respiratory: Tachypnea.  Clear to auscultation with no wheezes or rales. . Abdomen: Soft, nontender nondistended with normal bowel sounds x4 quadrants. . Muskuloskeletal: No cyanosis, clubbing or edema noted bilaterally . Neuro: CN II-XII intact, strength, sensation, reflexes . Skin: Decubitus ulcer with mild draining purulence noted. Marland Kitchen Psychiatry: Mood is appropriate for condition and setting          Labs on Admission:  Basic Metabolic Panel: Recent Labs  Lab 02/19/2021 2005  NA 129*  K 4.5  CL 92*  CO2 25  GLUCOSE 89  BUN 72*  CREATININE 2.53*  CALCIUM 8.6*   Liver Function Tests: Recent Labs  Lab 03/11/2021 2005  AST 14*  ALT 9  ALKPHOS 80  BILITOT 0.7  PROT 7.1  ALBUMIN 2.3*   No results for  input(s): LIPASE, AMYLASE in the last 168 hours. No results for input(s): AMMONIA in the last 168 hours. CBC: Recent Labs  Lab 03/01/2021 2005  WBC 17.2*  NEUTROABS 15.1*  HGB 10.2*  HCT 31.9*  MCV 94.7  PLT 375   Cardiac Enzymes: No results for input(s): CKTOTAL, CKMB, CKMBINDEX, TROPONINI in the last 168 hours.  BNP (last 3 results) Recent Labs    02/20/2021 2005  BNP 400.0*    ProBNP (last 3 results) No results for input(s): PROBNP in the last 8760 hours.  CBG: Recent Labs  Lab 02/28/2021 1939  GLUCAP 84    Radiological Exams on Admission: CT Head Wo Contrast  Result Date: 03/07/2021 CLINICAL DATA:  Altered mental status. Increased weakness. Decreased appetite. EXAM: CT HEAD  WITHOUT CONTRAST TECHNIQUE: Contiguous axial images were obtained from the base of the skull through the vertex without intravenous contrast. COMPARISON:  None. FINDINGS: Brain: Moderately enlarged ventricles and subarachnoid spaces. Moderate to marked patchy white matter low density in both cerebral hemispheres. There is low density in the lateral aspect of the pons on the right and in the right cerebellar hemisphere. These are in areas of streak artifacts produced by the skull base and have an appearance suggesting artifacts on the reconstructed sagittal and coronal images. Otherwise, no intracranial hemorrhage, mass lesion or CT evidence of acute infarction. Vascular: No hyperdense vessel or unexpected calcification. Skull: Normal. Negative for fracture or focal lesion. Sinuses/Orbits: Status post bilateral cataract extraction. Opacified left posterior ethmoid sinus. Other: None. IMPRESSION: 1. No acute abnormality. 2. Moderate diffuse cerebral and cerebellar atrophy. 3. Moderate to marked chronic small vessel white matter ischemic changes in both cerebral hemispheres. 4. Probable artifacts in the lateral aspect of the pons on the right and in the right cerebellar hemisphere. Areas of acute infarction are  significantly less likely. 5. Chronic left posterior ethmoid sinusitis. Electronically Signed   By: Claudie Revering M.D.   On: 02/19/2021 23:15   DG Chest Port 1 View  Result Date: 02/20/2021 CLINICAL DATA:  Weakness and decreased appetite. Recent knee surgery. EXAM: PORTABLE CHEST 1 VIEW COMPARISON:  01/10/2021 FINDINGS: Stable enlarged cardiac silhouette and tortuous thoracic aorta. Clear lungs with normal vascularity. Left shoulder prosthesis. Moderate thoracolumbar scoliosis. IMPRESSION: No acute abnormality. Stable cardiomegaly. Electronically Signed   By: Claudie Revering M.D.   On: 03/01/2021 21:27    EKG: I independently viewed the EKG done and my findings are as followed: Normal sinus rhythm at a rate of 88 bpm.   Assessment/Plan Present on Admission: . Acute kidney injury ([place]) . Essential hypertension . GERD . Hypothyroidism . PERIPHERAL VASCULAR DISEASE  Principal Problem:   Sepsis ([place]) Active Problems:   Essential hypertension   PERIPHERAL VASCULAR DISEASE   GERD   Hypothyroidism   Acute kidney injury ([place])   Pressure injury of skin   Generalized weakness   Acute metabolic encephalopathy   Leukocytosis   Hyponatremia   Dehydration   Hypoalbuminemia   Elevated brain natriuretic peptide (BNP) level   Acute metabolic encephalopathy in the setting of sepsis possibly secondary to infected decubitus ulcer Patient usually could talk and describe her wants and needs, she has not been able to do this in the last few days.  She was noted to be tachypneic, WBC was elevated (met SIRS criteria).  Decubitus ulcer (stage III/IV) with draining pus in the buttocks as source of infection noted. IV hydration per sepsis protocol was provided Patient was started on vancomycin and cefepime, we shall continue with same at this time Blood culture and urine culture pending Continue wound care Continue fall precaution and neurochecks  Leukocytosis in setting of above WBC 17.2, continue  treatment as described above  Acute kidney injury/dehydration BUN/creatinine 72/2.53 (baseline creatinine at 0.8-1.2) Continue IV hydration Renally adjust medications, avoid nephrotoxic agents/dehydration/hypotension  Generalized weakness Hypoalbuminemia possibly secondary to moderate protein calorie malnutrition Patient has had poor oral intake within the last few days Protein supplement will be provided once cleared by speech therapist  Dysphagia Patient was reported to have difficulty in swallowing within last few days with gagging/choking sensation on swallowing. SLP will be consulted for eval and treat Patient will be placed n.p.o. at this time  Elevated BNP BNP 400; patient appears dehydrated and not fluid overloaded  This may be due to patient's worsening kidney function Continue total input/output, daily weights  Echocardiogram in the morning   GERD Continue Protonix  Hypothyroidism Continue home Synthroid when patient is cleared by SLP  DVT Aspirin will be resumed once patient is cleared for oral intake   DVT prophylaxis: Lovenox  Code Status: Full code  Family Communication: None at bedside  Disposition Plan:  Patient is from:                        home Anticipated DC to:                   SNF or family members home Anticipated DC date:               2-3 days Anticipated DC barriers:           Patient is unstable to be discharged at this time due to sepsis secondary to infected decubitus ulcer   Consults called: None  Admission status: Inpatient    Bernadette Hoit MD [facility] Pager [phone]  If 7PM-7AM, please contact night-coverage [email or web address] Password [email or web address] [facility]  02/26/2021, 3:34 AM

## 2021-02-25 NOTE — Progress Notes (Signed)
Pharmacy Antibiotic Note  [name] is a 87 y.o. female admitted on 02/23/2021 with sepsis.  Pharmacy has been consulted for Vancomycin/Cefepime dosing. WBC elevated. Noted renal dysfunction.   Plan: Vancomycin 750 mg IV q48h >>Estimated AUC: 535 Cefepime 2g IV q24h Trend WBC, temp, renal function  F/U infectious work-up Drug levels as indicated   Height: 5\' 4"  (162.6 cm) Weight: 63.6 kg (140 lb 3.4 oz) IBW/kg (Calculated) : 54.7  Temp (24hrs), Avg:98 F (36.7 C), Min:97.8 F (36.6 C), Max:98.1 F (36.7 C)  Recent Labs  Lab 03/11/2021 2005 02/22/2021 2302  WBC 17.2*  --   CREATININE 2.53*  --   LATICACIDVEN 1.4 0.9    Estimated Creatinine Clearance: 13 mL/min (A) (by C-G formula based on SCr of 2.53 mg/dL (H)).    Allergies  Allergen Reactions  . Amoxicillin Other (See Comments)    Burning sensation.  Marland Kitchen Penicillins Other (See Comments)    Burning sensation.    Narda Bonds, PharmD, BCPS Clinical Pharmacist Phone: [phone]

## 2021-02-25 NOTE — ED Provider Notes (Signed)
[facility] EMERGENCY DEPARTMENT Provider Note   CSN: [id number] Arrival date & time: 03/17/2021  1921     History Chief Complaint  Patient presents with  . Weakness    [name] is a 87 y.o. female history of hypothyroidism presenting from home with family concern for confusion and weakness.  Onset of symptoms approximately 4 days ago on Wednesday.  At baseline the patient is able to answer simple questions and knows the date and time, can converse.  For the past day she is been somnolent and nonresponsive.  Her daughter [name] reports patient unable to swallow the past few days, gagging and choking.  " She's not eating very much for the past 3-4 days.  She won't get out of bed because of her knee, which was replaced last month."  At baseline she has some dementia, which waxes and wanes from day to day, but she can talk and describe her wants and needs.    She has sacral decubitus wounds.  Per her daughters' report, she is FULL CODE  Left distal femur fx repair on 01/14/21 with Dr Lyla Glassing  HPI     Past Medical History:  Diagnosis Date  . Arthritis   . Gastritis, Helicobacter pylori    Treated with Pylera, 2010  . Hypertension   . Hypothyroidism     Patient Active Problem List   Diagnosis Date Noted  . Pressure injury of skin 02/26/2021  . Sepsis ([place]) 02/26/2021  . Generalized weakness 02/26/2021  . Acute metabolic encephalopathy 72/53/6644  . Leukocytosis 02/26/2021  . Hyponatremia 02/26/2021  . Dehydration 02/26/2021  . Hypoalbuminemia 02/26/2021  . Elevated brain natriuretic peptide (BNP) level 02/26/2021  . Acute kidney injury ([place]) 03/14/2021  . 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 Diarrhea 05/23/2013  . Hypothyroidism 05/10/2013  . HYPERTHYROIDISM 12/23/2006  . HYPERLIPIDEMIA 12/23/2006  . MIGRAINE HEADACHE 12/23/2006  . CATARACT NOS 12/23/2006  . Essential hypertension 12/23/2006  . PERIPHERAL VASCULAR  DISEASE 12/23/2006  . ALLERGIC RHINITIS 12/23/2006  . COPD 12/23/2006  . GERD 12/23/2006  . LOW BACK PAIN 12/23/2006  . CARDIAC MURMUR 12/23/2006  . HYPERGLYCEMIA 12/23/2006  . FRACTURE, WRIST 12/23/2006    Past Surgical History:  Procedure Laterality Date  . ABDOMINAL HYSTERECTOMY    . CARPAL TUNNEL RELEASE    . CERVICAL FUSION    . COLON SURGERY  2010   Dr. Georgette Dover: secondary to cecal mass. Negative for malignancy  . COLONOSCOPY  May 2010   Dr. Oneida Alar: large cecal mass, referred to Dr. Georgette Dover  . ESOPHAGOGASTRODUODENOSCOPY  May 2010   normal esophagus, no Barrett's, moderate gastritis, +H.PYLORI  . JOINT REPLACEMENT     bilateral knee replacement, left shoulder surgery  . TOTAL KNEE REVISION Left 01/14/2021   Procedure: LEFT DISTAL FEMUR REPLACEMENT;  Surgeon: Rod Can, MD;  Location: [facility];  Service: Orthopedics;  Laterality: Left;     OB History   No obstetric history on file.     Family History  Problem Relation Age of Onset  . Colon cancer Neg Hx     Social History   Tobacco Use  . Smoking status: Never Smoker  . Smokeless tobacco: Never Used  Substance Use Topics  . Alcohol use: No  . Drug use: No    Home Medications Prior to Admission medications   Medication Sig Start Date End Date Taking? Authorizing Provider  acetaminophen (TYLENOL) 325 MG tablet Take 2 tablets (650 mg total) by mouth every  6 (six) hours as needed for mild pain (or Fever >/= 101). 01/17/21  Yes Geradine Girt, DO  ALPRAZolam (XANAX) 0.5 MG tablet Take 0.5 mg by mouth daily as needed. 02/14/21  Yes [provider]  cilostazol (PLETAL) 100 MG tablet Take 100 mg by mouth 2 (two) times daily.   Yes [provider]  felodipine (PLENDIL) 10 MG 24 hr tablet Take 10 mg by mouth daily.   Yes [provider]  furosemide (LASIX) 40 MG tablet Take 40 mg by mouth daily. 01/29/21  Yes [provider]  lansoprazole (PREVACID) 30 MG capsule Take 30 mg by mouth  daily.   Yes [provider]  metoprolol tartrate (LOPRESSOR) 25 MG tablet Take 1 tablet (25 mg total) by mouth 2 (two) times daily. 01/17/21  Yes Eulogio Bear U, DO  potassium chloride SA (KLOR-CON) 20 MEQ tablet Take 20 mEq by mouth daily.   Yes [provider]  SYNTHROID 100 MCG tablet Take 100 mcg by mouth every morning. 01/29/21  Yes [provider]  aspirin 81 MG chewable tablet Chew 1 tablet (81 mg total) by mouth 2 (two) times daily with a meal. Patient not taking: Reported on 02/26/2021 01/16/21 03/02/21  Cherlynn June B, PA  docusate sodium (COLACE) 100 MG capsule Take 1 capsule (100 mg total) by mouth 2 (two) times daily. Patient not taking: Reported on 02/26/2021 01/17/21   Geradine Girt, DO  feeding supplement (ENSURE ENLIVE / ENSURE PLUS) LIQD Take 237 mLs by mouth 3 (three) times daily between meals. Patient not taking: Reported on 02/26/2021 01/17/21   Geradine Girt, DO  HYDROcodone-acetaminophen (NORCO/VICODIN) 5-325 MG tablet Take 1 tablet by mouth every 4 (four) hours as needed for moderate pain. Patient not taking: Reported on 02/26/2021 01/16/21   Cherlynn June B, PA  LORazepam (ATIVAN) 0.5 MG tablet Take 1 tablet (0.5 mg total) by mouth every 6 (six) hours as needed for anxiety. Patient not taking: Reported on 02/26/2021 01/17/21   Geradine Girt, DO  vitamin B-12 1000 MCG tablet Take 1 tablet (1,000 mcg total) by mouth daily. Patient not taking: Reported on 02/26/2021 01/17/21   Geradine Girt, DO    Allergies    Amoxicillin and Penicillins  Review of Systems   Review of Systems  Unable to perform ROS: Mental status change (level 5 caveat)    Physical Exam Updated Vital Signs BP 101/64 (BP Location: Right Arm)   Pulse 98   Temp 97.6 F (36.4 C) (Oral)   Resp 18   Ht 5' 4.02" (1.626 m)   Wt 61.2 kg   SpO2 97%   BMI 23.15 kg/m   Physical Exam Constitutional:      General: She is not in acute distress. HENT:     Head: Normocephalic and  atraumatic.  Eyes:     Conjunctiva/sclera: Conjunctivae normal.     Pupils: Pupils are equal, round, and reactive to light.  Cardiovascular:     Rate and Rhythm: Normal rate and regular rhythm.  Pulmonary:     Effort: Pulmonary effort is normal. No respiratory distress.  Abdominal:     General: There is no distension.     Tenderness: There is no abdominal tenderness.  Skin:    General: Skin is warm and dry.     Comments: Multiple stage III sacral decubitus ulcers  Neurological:     General: No focal deficit present.     Mental Status: She is alert. Mental status is  at baseline.     GCS: GCS eye subscore is 3. GCS verbal subscore is 1. GCS motor subscore is 5.     Comments: Does not verbalize or follow commands     ED Results / Procedures / Treatments   Labs (all labs ordered are listed, but only abnormal results are displayed) Labs Reviewed  BASIC METABOLIC PANEL - Abnormal; Notable for the following components:      Result Value   Sodium 129 (*)    Chloride 92 (*)    BUN 72 (*)    Creatinine, Ser 2.53 (*)    Calcium 8.6 (*)    GFR, Estimated 18 (*)    All other components within normal limits  CBC WITH DIFFERENTIAL/PLATELET - Abnormal; Notable for the following components:   WBC 17.2 (*)    RBC 3.37 (*)    Hemoglobin 10.2 (*)    HCT 31.9 (*)    Neutro Abs 15.1 (*)    Abs Immature Granulocytes 0.45 (*)    All other components within normal limits  URINALYSIS, ROUTINE W REFLEX MICROSCOPIC - Abnormal; Notable for the following components:   APPearance HAZY (*)    Hgb urine dipstick LARGE (*)    All other components within normal limits  T4, FREE - Abnormal; Notable for the following components:   Free T4 2.24 (*)    All other components within normal limits  HEPATIC FUNCTION PANEL - Abnormal; Notable for the following components:   Albumin 2.3 (*)    AST 14 (*)    All other components within normal limits  BRAIN NATRIURETIC PEPTIDE - Abnormal; Notable for the  following components:   B Natriuretic Peptide 400.0 (*)    All other components within normal limits  BLOOD GAS, VENOUS - Abnormal; Notable for the following components:   Acid-Base Excess 3.3 (*)    All other components within normal limits  URINALYSIS, MICROSCOPIC (REFLEX) - Abnormal; Notable for the following components:   Bacteria, UA RARE (*)    All other components within normal limits  COMPREHENSIVE METABOLIC PANEL - Abnormal; Notable for the following components:   Sodium 134 (*)    BUN 70 (*)    Creatinine, Ser 2.17 (*)    Calcium 8.2 (*)    Total Protein 6.4 (*)    Albumin 2.0 (*)    AST 13 (*)    GFR, Estimated 21 (*)    All other components within normal limits  CBC - Abnormal; Notable for the following components:   WBC 15.1 (*)    RBC 3.16 (*)    Hemoglobin 9.5 (*)    HCT 30.3 (*)    All other components within normal limits  PROTIME-INR - Abnormal; Notable for the following components:   Prothrombin Time 15.7 (*)    INR 1.3 (*)    All other components within normal limits  RESP PANEL BY RT-PCR (FLU A&B, COVID) ARPGX2  CULTURE, BLOOD (SINGLE)  URINE CULTURE  TSH  LACTIC ACID, PLASMA  LACTIC ACID, PLASMA  PROTIME-INR  APTT  APTT  MAGNESIUM  PHOSPHORUS  CBG MONITORING, ED  TROPONIN I (HIGH SENSITIVITY)  TROPONIN I (HIGH SENSITIVITY)    EKG EKG Interpretation  Date/Time:  Sunday February 25 2021 19:44:04 EDT Ventricular Rate:  88 PR Interval:  193 QRS Duration: 95 QT Interval:  371 QTC Calculation: 436 R Axis:   16 Text Interpretation: Sinus rhythm Atrial premature complexes in couplets Low voltage, precordial leads Abnormal inferior Q waves No STEMI  Confirmed by Octaviano Glow [phone]) on 03/17/2021 8:47:33 PM   Radiology CT Head Wo Contrast  Result Date: 03/12/2021 CLINICAL DATA:  Altered mental status. Increased weakness. Decreased appetite. EXAM: CT HEAD WITHOUT CONTRAST TECHNIQUE: Contiguous axial images were obtained from the base of the skull  through the vertex without intravenous contrast. COMPARISON:  None. FINDINGS: Brain: Moderately enlarged ventricles and subarachnoid spaces. Moderate to marked patchy white matter low density in both cerebral hemispheres. There is low density in the lateral aspect of the pons on the right and in the right cerebellar hemisphere. These are in areas of streak artifacts produced by the skull base and have an appearance suggesting artifacts on the reconstructed sagittal and coronal images. Otherwise, no intracranial hemorrhage, mass lesion or CT evidence of acute infarction. Vascular: No hyperdense vessel or unexpected calcification. Skull: Normal. Negative for fracture or focal lesion. Sinuses/Orbits: Status post bilateral cataract extraction. Opacified left posterior ethmoid sinus. Other: None. IMPRESSION: 1. No acute abnormality. 2. Moderate diffuse cerebral and cerebellar atrophy. 3. Moderate to marked chronic small vessel white matter ischemic changes in both cerebral hemispheres. 4. Probable artifacts in the lateral aspect of the pons on the right and in the right cerebellar hemisphere. Areas of acute infarction are significantly less likely. 5. Chronic left posterior ethmoid sinusitis. Electronically Signed   By: Claudie Revering M.D.   On: 02/22/2021 23:15   DG Chest Port 1 View  Result Date: 03/07/2021 CLINICAL DATA:  Weakness and decreased appetite. Recent knee surgery. EXAM: PORTABLE CHEST 1 VIEW COMPARISON:  01/10/2021 FINDINGS: Stable enlarged cardiac silhouette and tortuous thoracic aorta. Clear lungs with normal vascularity. Left shoulder prosthesis. Moderate thoracolumbar scoliosis. IMPRESSION: No acute abnormality. Stable cardiomegaly. Electronically Signed   By: Claudie Revering M.D.   On: 03/14/2021 21:27    Procedures Procedures   Medications Ordered in ED Medications  ceFEPIme (MAXIPIME) 2 g in sodium chloride 0.9 % 100 mL IVPB (0 g Intravenous Stopped 02/26/21 0043)  enoxaparin (LOVENOX)  injection 30 mg (has no administration in time range)  vancomycin (VANCOREADY) IVPB 750 mg/150 mL (has no administration in time range)  pantoprazole (PROTONIX) injection 40 mg (40 mg Intravenous Given 02/26/21 0554)  Chlorhexidine Gluconate Cloth 2 % PADS 6 each (has no administration in time range)  sodium chloride 0.9 % bolus 1,000 mL (0 mLs Intravenous Stopped 03/02/2021 2202)  sodium chloride 0.9 % bolus 1,000 mL (0 mLs Intravenous Stopped 02/26/21 0115)  vancomycin (VANCOCIN) IVPB 1000 mg/200 mL premix (0 mg Intravenous Stopped 02/26/21 0115)  0.9 %  sodium chloride infusion (0 mLs Intravenous Stopped 02/26/21 7048)    ED Course  I have reviewed the triage vital signs and the nursing notes.  Pertinent labs & imaging results that were available during my care of the patient were reviewed by me and considered in my medical decision making (see chart for details).   This patient presents to the Emergency Department with complaint of altered mental status.  This involves an extensive number of treatment options, and is a complaint that carries with it a high risk of complications and morbidity.  The differential diagnosis includes hypoglycemia vs metabolic encephalopathy vs infection (including cystitis) vs ICH vs stroke vs polypharmacy vs other  I ordered, reviewed, and interpreted labs, including UA negative, Covid/flu negative, Lactate normal, WBC elevated 17K, trop 9 -> 8.  Na 129.  Cr elevated 2.53, BUN 72.  BNP 400.  Hgb 10.2. I ordered medication IV antibiotics BS, IV fluids for sepsis/infection and pre-renal AKI,  likely 2/2 dehydration I ordered imaging studies which included CTH, DG chest I independently visualized and interpreted imaging which showed no evident PNA or PTX, no likely acute CVA of the brain, noted streak artifacts on CTH, and the monitor tracing which showed regular rhythm Additional history was obtained from pt's daughters by phone I personally reviewed the patients ECG  which showed sinus rhythm with no acute ischemic findings  Patient's  BP low but stable throughout ED stay, improving with some fluids.  Vitals normal.  Plan to admit for sepsis rule out, management of AKI   Clinical Course as of 02/26/21 1006  Sun Feb 25, 2021  2250 AKI, likely prerenal.  WBC elevated.  Will cath for UA.  IV antibiotics ordered for sepsis. [MT]  2320 Admitted to hospitalist for AKI, suspected infection. [MT]    Clinical Course User Index [MT] Renn Stille, Carola Rhine, MD   Final Clinical Impression(s) / ED Diagnoses Final diagnoses:  AKI (acute kidney injury) ([place])  Altered mental status, unspecified altered mental status type    Rx / DC Orders ED Discharge Orders    None       Wyvonnia Dusky, MD 02/26/21 1006

## 2021-02-26 ENCOUNTER — Other Ambulatory Visit (HOSPITAL_COMMUNITY): Payer: Self-pay | Admitting: *Deleted

## 2021-02-26 ENCOUNTER — Inpatient Hospital Stay (HOSPITAL_COMMUNITY): Payer: Medicare Other

## 2021-02-26 ENCOUNTER — Encounter (HOSPITAL_COMMUNITY): Payer: Self-pay | Admitting: Internal Medicine

## 2021-02-26 DIAGNOSIS — A419 Sepsis, unspecified organism: Secondary | ICD-10-CM | POA: Diagnosis present

## 2021-02-26 DIAGNOSIS — L8990 Pressure ulcer of unspecified site, unspecified stage: Secondary | ICD-10-CM | POA: Diagnosis present

## 2021-02-26 DIAGNOSIS — I480 Paroxysmal atrial fibrillation: Secondary | ICD-10-CM | POA: Diagnosis present

## 2021-02-26 DIAGNOSIS — N179 Acute kidney failure, unspecified: Secondary | ICD-10-CM | POA: Diagnosis not present

## 2021-02-26 DIAGNOSIS — E860 Dehydration: Secondary | ICD-10-CM | POA: Diagnosis present

## 2021-02-26 DIAGNOSIS — E871 Hypo-osmolality and hyponatremia: Secondary | ICD-10-CM | POA: Diagnosis present

## 2021-02-26 DIAGNOSIS — D72829 Elevated white blood cell count, unspecified: Secondary | ICD-10-CM | POA: Diagnosis present

## 2021-02-26 DIAGNOSIS — E8809 Other disorders of plasma-protein metabolism, not elsewhere classified: Secondary | ICD-10-CM | POA: Diagnosis present

## 2021-02-26 DIAGNOSIS — R7989 Other specified abnormal findings of blood chemistry: Secondary | ICD-10-CM | POA: Insufficient documentation

## 2021-02-26 DIAGNOSIS — I5031 Acute diastolic (congestive) heart failure: Secondary | ICD-10-CM

## 2021-02-26 DIAGNOSIS — G9341 Metabolic encephalopathy: Secondary | ICD-10-CM | POA: Diagnosis not present

## 2021-02-26 DIAGNOSIS — R531 Weakness: Secondary | ICD-10-CM | POA: Insufficient documentation

## 2021-02-26 LAB — CBC
HCT: 30.3 % — ABNORMAL LOW (ref 36.0–46.0)
Hemoglobin: 9.5 g/dL — ABNORMAL LOW (ref 12.0–15.0)
MCH: 30.1 pg (ref 26.0–34.0)
MCHC: 31.4 g/dL (ref 30.0–36.0)
MCV: 95.9 fL (ref 80.0–100.0)
Platelets: 327 10*3/uL (ref 150–400)
RBC: 3.16 MIL/uL — ABNORMAL LOW (ref 3.87–5.11)
RDW: 15.1 % (ref 11.5–15.5)
WBC: 15.1 10*3/uL — ABNORMAL HIGH (ref 4.0–10.5)
nRBC: 0 % (ref 0.0–0.2)

## 2021-02-26 LAB — COMPREHENSIVE METABOLIC PANEL
ALT: 8 U/L (ref 0–44)
AST: 13 U/L — ABNORMAL LOW (ref 15–41)
Albumin: 2 g/dL — ABNORMAL LOW (ref 3.5–5.0)
Alkaline Phosphatase: 69 U/L (ref 38–126)
Anion gap: 12 (ref 5–15)
BUN: 70 mg/dL — ABNORMAL HIGH (ref 8–23)
CO2: 22 mmol/L (ref 22–32)
Calcium: 8.2 mg/dL — ABNORMAL LOW (ref 8.9–10.3)
Chloride: 100 mmol/L (ref 98–111)
Creatinine, Ser: 2.17 mg/dL — ABNORMAL HIGH (ref 0.44–1.00)
GFR, Estimated: 21 mL/min — ABNORMAL LOW (ref >60)
Glucose, Bld: 78 mg/dL (ref 70–99)
Potassium: 3.8 mmol/L (ref 3.5–5.1)
Sodium: 134 mmol/L — ABNORMAL LOW (ref 135–145)
Total Bilirubin: 0.7 mg/dL (ref 0.3–1.2)
Total Protein: 6.4 g/dL — ABNORMAL LOW (ref 6.5–8.1)

## 2021-02-26 LAB — URINALYSIS, MICROSCOPIC (REFLEX)

## 2021-02-26 LAB — PHOSPHORUS: Phosphorus: 4.3 mg/dL (ref 2.5–4.6)

## 2021-02-26 LAB — ECHOCARDIOGRAM COMPLETE
Area-P 1/2: 4.33 cm2
Height: 64.016 in
S' Lateral: 3 cm
Weight: 2158.74 oz

## 2021-02-26 LAB — PROTIME-INR
INR: 1.3 — ABNORMAL HIGH (ref 0.8–1.2)
Prothrombin Time: 15.7 seconds — ABNORMAL HIGH (ref 11.4–15.2)

## 2021-02-26 LAB — URINALYSIS, ROUTINE W REFLEX MICROSCOPIC
Bilirubin Urine: NEGATIVE
Glucose, UA: NEGATIVE mg/dL
Ketones, ur: NEGATIVE mg/dL
Leukocytes,Ua: NEGATIVE
Nitrite: NEGATIVE
Protein, ur: NEGATIVE mg/dL
Specific Gravity, Urine: 1.01 (ref 1.005–1.030)
pH: 5.5 (ref 5.0–8.0)

## 2021-02-26 LAB — MAGNESIUM: Magnesium: 2 mg/dL (ref 1.7–2.4)

## 2021-02-26 LAB — T4, FREE: Free T4: 2.24 ng/dL — ABNORMAL HIGH (ref 0.61–1.12)

## 2021-02-26 LAB — APTT: aPTT: 33 seconds (ref 24–36)

## 2021-02-26 MED ORDER — SODIUM CHLORIDE 0.9 % IV SOLN: INTRAVENOUS | Status: DC

## 2021-02-26 MED ORDER — METOPROLOL TARTRATE 5 MG/5ML IV SOLN
5.0000 mg | Freq: Once | INTRAVENOUS | Status: AC
  Administered 2021-02-26: 5 mg via INTRAVENOUS
  Filled 2021-02-26: qty 5

## 2021-02-26 MED ORDER — CILOSTAZOL 100 MG PO TABS
100.0000 mg | ORAL_TABLET | Freq: Two times a day (BID) | ORAL | Status: DC
  Administered 2021-02-26 – 2021-03-09 (×15): 100 mg via ORAL
  Filled 2021-02-26 (×24): qty 1

## 2021-02-26 MED ORDER — ASPIRIN EC 81 MG PO TBEC
81.0000 mg | DELAYED_RELEASE_TABLET | Freq: Every day | ORAL | Status: DC
  Administered 2021-02-27 – 2021-03-02 (×4): 81 mg via ORAL
  Filled 2021-02-26 (×5): qty 1

## 2021-02-26 MED ORDER — ENOXAPARIN SODIUM 30 MG/0.3ML ~~LOC~~ SOLN
30.0000 mg | SUBCUTANEOUS | Status: DC
  Administered 2021-02-26 – 2021-02-28 (×3): 30 mg via SUBCUTANEOUS
  Filled 2021-02-26 (×3): qty 0.3

## 2021-02-26 MED ORDER — ALPRAZOLAM 0.25 MG PO TABS
0.2500 mg | ORAL_TABLET | Freq: Two times a day (BID) | ORAL | Status: DC | PRN
  Administered 2021-03-01: 0.25 mg via ORAL
  Filled 2021-02-26: qty 1

## 2021-02-26 MED ORDER — METOPROLOL TARTRATE 12.5 MG HALF TABLET
12.5000 mg | ORAL_TABLET | Freq: Two times a day (BID) | ORAL | Status: DC
  Administered 2021-02-26 – 2021-03-07 (×13): 12.5 mg via ORAL
  Filled 2021-02-26 (×15): qty 1

## 2021-02-26 MED ORDER — HYDROCODONE-ACETAMINOPHEN 7.5-325 MG/15ML PO SOLN
10.0000 mL | ORAL | Status: DC | PRN
Start: 2021-02-26 — End: 2021-03-03
  Administered 2021-02-26 – 2021-03-01 (×3): 10 mL via ORAL
  Filled 2021-02-26 (×3): qty 15

## 2021-02-26 MED ORDER — VITAMIN B-12 1000 MCG PO TABS
1000.0000 ug | ORAL_TABLET | Freq: Every day | ORAL | Status: DC
  Administered 2021-02-26 – 2021-03-05 (×6): 1000 ug via ORAL
  Filled 2021-02-26 (×10): qty 1

## 2021-02-26 MED ORDER — SODIUM CHLORIDE 0.9 % IV SOLN: Freq: Once | INTRAVENOUS | Status: AC

## 2021-02-26 MED ORDER — CHLORHEXIDINE GLUCONATE CLOTH 2 % EX PADS
6.0000 | MEDICATED_PAD | Freq: Every day | CUTANEOUS | Status: DC
  Administered 2021-02-26 – 2021-03-02 (×5): 6 via TOPICAL

## 2021-02-26 MED ORDER — PANTOPRAZOLE SODIUM 40 MG IV SOLR
40.0000 mg | INTRAVENOUS | Status: DC
  Administered 2021-02-26 – 2021-02-28 (×3): 40 mg via INTRAVENOUS
  Filled 2021-02-26 (×3): qty 40

## 2021-02-26 NOTE — Consult Note (Addendum)
[facility] Nurse Consult Note: Reason for Consult: Consult requested for multiple wounds.  Performed remotely after review of progress notes and photos in the EMR. Refer to nursing wound assessment flow sheet for measurements performed by the bedside nurse. Wound type: Sacrum with 2 separate chronic Stage 4 pressure injuries; seperated at the skin level by a narrow bridge of intact skin.  Red and moist, they were noted to have mod amt tan drainage. They are surrounded by pink dry scar tissue.  Right ankle with chronic Stage 4 pressure injury; red and moist, surrounded by pink dry scar tissue Left heel with dark red-purple deep tissue pressure injury Knee where previous surgery was perfromed with intact surgical scar tissue with erythremia surrounding.  Pressure Injury POA: Yes Dressing procedure/placement/frequency: Air mattress ordered to reduce pressure.  Float heels to reduce pressure. Topical treatment orders provided for the bedside nurse to perform as follows to absorb drainage and provide antimicrobial benifits: Pack sacrum and right ankle wounds with Aquacel Kellie Simmering # [phone]) Q day, using swab to fill, then cover with foam dressing.  Moisten with saline each time to remove dressings. (Change foam dressings Q 3 days or PRN soiling.) Please re-consult if further assistance is needed.  Thank-you,  Julien Girt MSN, [place], [place], [place], [place]

## 2021-02-26 NOTE — Plan of Care (Signed)
  Problem: SLP Dysphagia Goals Goal: Patient will utilize recommended strategies Description: Patient will utilize recommended strategies during swallow to increase swallowing safety with Flowsheets (Taken 02/26/2021 1609) Patient will utilize recommended strategies during swallow to increase swallowing safety with: min assist  Thank you,  Genene Churn, CCC-SLP [phone]

## 2021-02-26 NOTE — Progress Notes (Signed)
*  PRELIMINARY RESULTS* Echocardiogram 2D Echocardiogram has been performed.  [name] 02/26/2021, 2:47 PM

## 2021-02-26 NOTE — Evaluation (Signed)
Clinical/Bedside Swallow Evaluation Patient Details  Name: [name] MRN: [id number] Date of Birth: 17-Jun-1929  Today's Date: 02/26/2021 Time: SLP Start Time (ACUTE ONLY): 1330 SLP Stop Time (ACUTE ONLY): 1356 SLP Time Calculation (min) (ACUTE ONLY): 26 min  Past Medical History:  Past Medical History:  Diagnosis Date  . Arthritis   . Gastritis, Helicobacter pylori    Treated with Pylera, 2010  . Hypertension   . Hypothyroidism    Past Surgical History:  Past Surgical History:  Procedure Laterality Date  . ABDOMINAL HYSTERECTOMY    . CARPAL TUNNEL RELEASE    . CERVICAL FUSION    . COLON SURGERY  2010   Dr. Georgette Dover: secondary to cecal mass. Negative for malignancy  . COLONOSCOPY  May 2010   Dr. Oneida Alar: large cecal mass, referred to Dr. Georgette Dover  . ESOPHAGOGASTRODUODENOSCOPY  May 2010   normal esophagus, no Barrett's, moderate gastritis, +H.PYLORI  . JOINT REPLACEMENT     bilateral knee replacement, left shoulder surgery  . TOTAL KNEE REVISION Left 01/14/2021   Procedure: LEFT DISTAL FEMUR REPLACEMENT;  Surgeon: Rod Can, MD;  Location: [facility];  Service: Orthopedics;  Laterality: Left;   HPI:  [name] is a 87 y.o. female with medical history significant for hypothyroidism, hypertension, PVD and arthritis who presents to the emergency department via EMS due to 4-day onset of confusion and weakness.  Patient was unable to provide a history, history was obtained from ED physician and ED medical record.  Per report, patient was unable to swallow within past 3 to 4 days, she presented with gagging and choking sensation on eating.  Apparently, patient was admitted to the hospital from 2/21-3/2 due to left distal femur fracture s/p left distal femoral replacement by Dr. Lyla Glassing, she was discharged to a SNF and was recently discharged home from the SNF.  Patient has not been getting out of bed because of her knee (that was just replaced).  Patient was said to have dementia which  waxes and wane from day to day at baseline, but she can talk and describe her wants and needs.BSE requested.   Assessment / Plan / Recommendation Clinical Impression  Pt presents with mild oral phase dysphagia characterized by impaired mastication negatively impacted by edentulous status and weak lingual manipulation resulting in prologed oral transit and oral scatter of puree and chopped textures post swallow. Pt does not present with signs or symptoms of aspiration, however she is at risk due to bed bound status, difficulty with self feeding, and cognitive deficits. Recommend D2/fine chop and thin liquids with aspiration and reflux precautions; feeder assist and assist with oral care. SLP will follow for diet tolerance and Pt/family education during acute stay. SLP Visit Diagnosis: Dysphagia, unspecified (R13.10)    Aspiration Risk  Mild aspiration risk;Risk for inadequate nutrition/hydration    Diet Recommendation Dysphagia 2 (Fine chop);Thin liquid   Liquid Administration via: Cup;Straw Medication Administration: Whole meds with liquid (or whole in puree) Supervision: Staff to assist with self feeding;Full supervision/cueing for compensatory strategies Compensations: Slow rate;Small sips/bites;Follow solids with liquid Postural Changes: Seated upright at 90 degrees;Remain upright for at least 30 minutes after po intake    Other  Recommendations Oral Care Recommendations: Oral care BID;Staff/trained caregiver to provide oral care Other Recommendations: Clarify dietary restrictions   Follow up Recommendations 24 hour supervision/assistance      Frequency and Duration min 2x/week  1 week       Prognosis Prognosis for Safe Diet Advancement: Fair Barriers  to Reach Goals: Cognitive deficits      Swallow Study   General Date of Onset: 02/28/2021 HPI: ADAYA GARMANY is a 87 y.o. female with medical history significant for hypothyroidism, hypertension, PVD and arthritis who presents to  the emergency department via EMS due to 4-day onset of confusion and weakness.  Patient was unable to provide a history, history was obtained from ED physician and ED medical record.  Per report, patient was unable to swallow within past 3 to 4 days, she presented with gagging and choking sensation on eating.  Apparently, patient was admitted to the hospital from 2/21-3/2 due to left distal femur fracture s/p left distal femoral replacement by Dr. Lyla Glassing, she was discharged to a SNF and was recently discharged home from the SNF.  Patient has not been getting out of bed because of her knee (that was just replaced).  Patient was said to have dementia which waxes and wane from day to day at baseline, but she can talk and describe her wants and needs.BSE requested. Type of Study: Bedside Swallow Evaluation Previous Swallow Assessment: None on record Diet Prior to this Study: Dysphagia 2 (chopped);Thin liquids Temperature Spikes Noted: No Respiratory Status: Room air History of Recent Intubation: No Behavior/Cognition: Alert;Cooperative;Requires cueing Oral Cavity Assessment: Within Functional Limits Oral Care Completed by SLP: Recent completion by staff Oral Cavity - Dentition: Edentulous Vision: Impaired for self-feeding Self-Feeding Abilities: Needs assist Patient Positioning: Upright in bed Baseline Vocal Quality: Normal;Low vocal intensity Volitional Cough: Strong Volitional Swallow: Able to elicit    Oral/Motor/Sensory Function Overall Oral Motor/Sensory Function: Within functional limits   Ice Chips Ice chips: Within functional limits Presentation: Spoon   Thin Liquid Thin Liquid: Within functional limits Presentation: Cup;Straw;Self Fed (hand over hand assist)    Nectar Thick Nectar Thick Liquid: Not tested   Honey Thick Honey Thick Liquid: Not tested   Puree Puree: Within functional limits Presentation: Spoon   Solid     Solid: Impaired Presentation: Spoon Oral Phase  Impairments: Reduced lingual movement/coordination;Impaired mastication Oral Phase Functional Implications: Oral residue     Thank you,  [name], [place]  [name] 02/26/2021,4:08 PM

## 2021-02-26 NOTE — Progress Notes (Addendum)
Patient Demographics:    [name], is a 87 y.o. female, DOB - Apr 18, 1929, OXB:[id number]  Admit date - 03/04/2021   Admitting Physician Bernadette Hoit, DO  Outpatient Primary MD for the patient is Rosita Fire, MD  LOS - 1   Chief Complaint  Patient presents with  . Weakness        Subjective:    [name] today has no fevers, no emesis,  No chest pain,   ---daughter [name] at bedside -Central telemetry called that patient is having episodes of A. fib with RVR with heart rate up to the 150s to 160s -Give patient IV metoprolol and restarted oral metoprolol as BP allows  Assessment  & Plan :    Principal Problem:   Sepsis due to left leg cellulitis and possible UTI Active Problems:   Acute metabolic encephalopathy   Paroxysmal A-fib (HCC)   Essential hypertension   PERIPHERAL VASCULAR DISEASE   GERD   Hypothyroidism   Acute kidney injury (HCC)   Pressure injury of skin   Generalized weakness   Leukocytosis   Hyponatremia   Dehydration   Hypoalbuminemia   Elevated brain natriuretic peptide (BNP) level   Brief Summary:-  87 y.o. female with medical history significant for hypothyroidism, hypertension, PVD and arthritis admitted on 03/15/2021 with increasing confusion, generalized weakness and found to have sepsis secondary to left lower extremity cellulitis and possible UTI   A/p 1) sepsis secondary to left leg cellulitis and possible UTI--- get x-rays of the left knee -Please see photos in epic -Leukocytosis, tachycardia and tachypnea noted,   -BP improved with IV fluid -Continue IV Vanco and cefepime pending further culture data  2)PAFib-- -Central telemetry called that patient is having episodes of A. fib with RVR with heart rate up to the 150s to 160s -Attempted to do a twelve-lead EKG but patient was back in sinus rhythm at the time of the EKG -Give patient IV metoprolol  and restarted oral metoprolol as BP allows -Bedside benefit of Full anticoagulation discussed with patient's daughter [name] at bedside -She declined full anticoagulation at this time  3) acute metabolic encephalopathy--- superimposed on underlying dementia -As per patient's daughter this is different from patient's baseline -This is most likely due to #1 above  4)PAD--continue aspirin and Pletal  5) history of B12 and iron deficiency anemia--- give B12 tablets and  iron sulfate  6)Dementia with baseline cognitive and memory deficits--- Xanax as needed  7)Lt Knee-Pain--status post periprosthetic distal femur fracture--status post operative treatment on 01/14/2021---  by Dr. Lyla Glassing, -Left knee x-rays pending  8)AKI----acute kidney injury   -Creatinine is down to 2.1 from 2.5 on admission with hydration --renally adjust medications, avoid nephrotoxic agents / dehydration  / hypotension  Disposition/Need for in-Hospital Stay- patient unable to be discharged at this time due to sepsis with hemodynamic concerns requiring IV antibiotics and IV fluids  Status is: Inpatient  Remains inpatient appropriate because:Please see above   Disposition: The patient is from: Home              Anticipated d/c is to: Home              Anticipated d/c date is: 2 days  Patient currently is not medically stable to d/c. Barriers: Not Clinically Stable-   Code Status :  -  Code Status: Full Code   Family Communication:   Discussed with daughter [name] at bedside  Consults  :    DVT Prophylaxis  :   - SCDs \  enoxaparin (LOVENOX) injection 30 mg Start: 02/26/21 1000 SCDs Start: 02/26/21 0147    Lab Results  Component Value Date   PLT 327 02/26/2021    Inpatient Medications  Scheduled Meds: . Chlorhexidine Gluconate Cloth  6 each Topical Daily  . enoxaparin (LOVENOX) injection  30 mg Subcutaneous Q24H  . metoprolol tartrate  12.5 mg Oral BID  . pantoprazole (PROTONIX) IV  40 mg  Intravenous Q24H   Continuous Infusions: . ceFEPime (MAXIPIME) IV Stopped (02/26/21 0043)  . [START ON 02/27/2021] vancomycin     PRN Meds:.HYDROcodone-acetaminophen    Anti-infectives (From admission, onward)   Start     Dose/Rate Route Frequency Ordered Stop   02/27/21 2200  vancomycin (VANCOREADY) IVPB 750 mg/150 mL        750 mg 150 mL/hr over 60 Minutes Intravenous Every 48 hours 03/05/2021 2342     03/10/2021 2315  vancomycin (VANCOCIN) IVPB 1000 mg/200 mL premix        1,000 mg 200 mL/hr over 60 Minutes Intravenous  Once 03/05/2021 2313 02/26/21 0115   03/12/2021 2315  ceFEPIme (MAXIPIME) 2 g in sodium chloride 0.9 % 100 mL IVPB        2 g 200 mL/hr over 30 Minutes Intravenous Every 24 hours 02/24/2021 2313          Objective:   Vitals:   02/26/21 0149 02/26/21 0558 02/26/21 1359 02/26/21 1612  BP: (!) 109/57 101/64 (!) 98/52 107/74  Pulse: 93 98 96   Resp: 18 18 18    Temp: 98.7 F (37.1 C) 97.6 F (36.4 C) 99.6 F (37.6 C)   TempSrc: Oral Oral Oral   SpO2: 94% 97% 100%   Weight:      Height:        Wt Readings from Last 3 Encounters:  02/26/21 61.2 kg  01/17/21 63.6 kg  04/29/14 84.4 kg     Intake/Output Summary (Last 24 hours) at 02/26/2021 1650 Last data filed at 02/26/2021 1345 Gross per 24 hour  Intake 2506.4 ml  Output --  Net 2506.4 ml     Physical Exam  Gen:- Awake Alert,  In no apparent distress  HEENT:- [place].AT, No sclera icterus Neck-Supple Neck,No JVD,.  Lungs-  CTAB , fair symmetrical air movement CV- S1, S2 normal, irregular  abd-  +ve B.Sounds, Abd Soft, No tenderness,    Extremity-- pedal pulses present  Psych-affect is flat,, baseline cognitive and memory deficits noted Neuro-generalized weakness, no new focal deficits, no tremors Lt Leg/Knee --with swelling, erythema and warmth consistent with cellulitis, please see photos in epic -Skin-sacral decubitus ulcers as well as right lateral malleolus ulcer without superimposed cellulitis    Data Review:   Micro Results Recent Results (from the past 240 hour(s))  Blood culture (routine single)     Status: None (Preliminary result)   Collection Time: 02/19/2021  8:57 PM   Specimen: Right Antecubital; Blood  Result Value Ref Range Status   Specimen Description RIGHT ANTECUBITAL  Final   Special Requests   Final    BOTTLES DRAWN AEROBIC AND ANAEROBIC Blood Culture adequate volume   Culture   Final    NO GROWTH < 24 HOURS Performed at [facility]  [facility], [address], [place], [place] [address]    Report Status PENDING  Incomplete  Resp Panel by RT-PCR (Flu A&B, Covid) Nasopharyngeal Swab     Status: None   Collection Time: 03/16/2021  9:00 PM   Specimen: Nasopharyngeal Swab; Nasopharyngeal(NP) swabs in vial transport medium  Result Value Ref Range Status   SARS Coronavirus 2 by RT PCR NEGATIVE NEGATIVE Final    Comment: (NOTE) SARS-CoV-2 target nucleic acids are NOT DETECTED.  The SARS-CoV-2 RNA is generally detectable in upper respiratory specimens during the acute phase of infection. The lowest concentration of SARS-CoV-2 viral copies this assay can detect is 138 copies/mL. A negative result does not preclude SARS-Cov-2 infection and should not be used as the sole basis for treatment or other patient management decisions. A negative result may occur with  improper specimen collection/handling, submission of specimen other than nasopharyngeal swab, presence of viral mutation(s) within the areas targeted by this assay, and inadequate number of viral copies(<138 copies/mL). A negative result must be combined with clinical observations, patient history, and epidemiological information. The expected result is Negative.  Fact Sheet for Patients:  EntrepreneurPulse.com.au  Fact Sheet for Healthcare Providers:  IncredibleEmployment.be  This test is no t yet approved or cleared by the [place] FDA and  has been authorized for detection  and/or diagnosis of SARS-CoV-2 by FDA under an Emergency Use Authorization (EUA). This EUA will remain  in effect (meaning this test can be used) for the duration of the COVID-19 declaration under Section 564(b)(1) of the Act, 21 U.S.C.section 360bbb-3(b)(1), unless the authorization is terminated  or revoked sooner.       Influenza A by PCR NEGATIVE NEGATIVE Final   Influenza B by PCR NEGATIVE NEGATIVE Final    Comment: (NOTE) The Xpert Xpress SARS-CoV-2/FLU/RSV plus assay is intended as an aid in the diagnosis of influenza from Nasopharyngeal swab specimens and should not be used as a sole basis for treatment. Nasal washings and aspirates are unacceptable for Xpert Xpress SARS-CoV-2/FLU/RSV testing.  Fact Sheet for Patients: EntrepreneurPulse.com.au  Fact Sheet for Healthcare Providers: IncredibleEmployment.be  This test is not yet approved or cleared by the [place] FDA and has been authorized for detection and/or diagnosis of SARS-CoV-2 by FDA under an Emergency Use Authorization (EUA). This EUA will remain in effect (meaning this test can be used) for the duration of the COVID-19 declaration under Section 564(b)(1) of the Act, 21 U.S.C. section 360bbb-3(b)(1), unless the authorization is terminated or revoked.  Performed at [facility], [address] [place], [place] [address]     Radiology Reports CT Head Wo Contrast  Result Date: 03/01/2021 CLINICAL DATA:  Altered mental status. Increased weakness. Decreased appetite. EXAM: CT HEAD WITHOUT CONTRAST TECHNIQUE: Contiguous axial images were obtained from the base of the skull through the vertex without intravenous contrast. COMPARISON:  None. FINDINGS: Brain: Moderately enlarged ventricles and subarachnoid spaces. Moderate to marked patchy white matter low density in both cerebral hemispheres. There is low density in the lateral aspect of the pons on the right and in the right  cerebellar hemisphere. These are in areas of streak artifacts produced by the skull base and have an appearance suggesting artifacts on the reconstructed sagittal and coronal images. Otherwise, no intracranial hemorrhage, mass lesion or CT evidence of acute infarction. Vascular: No hyperdense vessel or unexpected calcification. Skull: Normal. Negative for fracture or focal lesion. Sinuses/Orbits: Status post bilateral cataract extraction. Opacified left posterior ethmoid sinus. Other: None. IMPRESSION: 1. No acute abnormality.  2. Moderate diffuse cerebral and cerebellar atrophy. 3. Moderate to marked chronic small vessel white matter ischemic changes in both cerebral hemispheres. 4. Probable artifacts in the lateral aspect of the pons on the right and in the right cerebellar hemisphere. Areas of acute infarction are significantly less likely. 5. Chronic left posterior ethmoid sinusitis. Electronically Signed   By: Claudie Revering M.D.   On: 02/22/2021 23:15   DG Chest Port 1 View  Result Date: 03/05/2021 CLINICAL DATA:  Weakness and decreased appetite. Recent knee surgery. EXAM: PORTABLE CHEST 1 VIEW COMPARISON:  01/10/2021 FINDINGS: Stable enlarged cardiac silhouette and tortuous thoracic aorta. Clear lungs with normal vascularity. Left shoulder prosthesis. Moderate thoracolumbar scoliosis. IMPRESSION: No acute abnormality. Stable cardiomegaly. Electronically Signed   By: Claudie Revering M.D.   On: 02/27/2021 21:27   ECHOCARDIOGRAM COMPLETE  Result Date: 02/26/2021    ECHOCARDIOGRAM REPORT   Patient Name:   [name] Date of Exam: 02/26/2021 Medical Rec #:  [id number]      Height:       64.0 in Accession #:    [id number]     Weight:       134.9 lb Date of Birth:  [birth year]     BSA:          1.655 m Patient Age:    66 years       BP:           101/64 mmHg Patient Gender: F              HR:           98 bpm. Exam Location:  [facility] Procedure: 2D Echo, Cardiac Doppler and Color Doppler Indications:     CHF-Acute Diastolic V25.36  History:        Patient has no prior history of Echocardiogram examinations.                 COPD; Risk Factors:Hypertension and Dyslipidemia. Elevated BNP,                 Sepsis.  Sonographer:    Alvino Chapel RCS Referring Phys: [id number] OLADAPO ADEFESO IMPRESSIONS  1. Small LV cavity with very hyperdynamic function consider volume or beta blockade if clinically indicated . Left ventricular ejection fraction, by estimation, is 60 to 65%. The left ventricle has normal function. The left ventricle has no regional wall motion abnormalities. Left ventricular diastolic parameters are consistent with Grade I diastolic dysfunction (impaired relaxation).  2. Right ventricular systolic function is normal. The right ventricular size is normal.  3. The mitral valve is degenerative. No evidence of mitral valve regurgitation. No evidence of mitral stenosis. Moderate mitral annular calcification.  4. The aortic valve was not well visualized. There is moderate calcification of the aortic valve. Aortic valve regurgitation is not visualized. Mild to moderate aortic valve sclerosis/calcification is present, without any evidence of aortic stenosis.  5. The inferior vena cava is dilated in size with >50% respiratory variability, suggesting right atrial pressure of 8 mmHg. FINDINGS  Left Ventricle: Small LV cavity with very hyperdynamic function consider volume or beta blockade if clinically indicated. Left ventricular ejection fraction, by estimation, is 60 to 65%. The left ventricle has normal function. The left ventricle has no regional wall motion abnormalities. The left ventricular internal cavity size was normal in size. There is no left ventricular hypertrophy. Left ventricular diastolic parameters are consistent with Grade I diastolic dysfunction (impaired relaxation). Right Ventricle: The right ventricular size is  normal. No increase in right ventricular wall thickness. Right ventricular systolic  function is normal. Left Atrium: Left atrial size was normal in size. Right Atrium: Right atrial size was normal in size. Pericardium: There is no evidence of pericardial effusion. Mitral Valve: The mitral valve is degenerative in appearance. There is mild thickening of the mitral valve leaflet(s). There is mild calcification of the mitral valve leaflet(s). Moderate mitral annular calcification. No evidence of mitral valve regurgitation. No evidence of mitral valve stenosis. Tricuspid Valve: The tricuspid valve is normal in structure. Tricuspid valve regurgitation is not demonstrated. No evidence of tricuspid stenosis. Aortic Valve: The aortic valve was not well visualized. There is moderate calcification of the aortic valve. Aortic valve regurgitation is not visualized. Mild to moderate aortic valve sclerosis/calcification is present, without any evidence of aortic stenosis. Pulmonic Valve: The pulmonic valve was normal in structure. Pulmonic valve regurgitation is not visualized. No evidence of pulmonic stenosis. Aorta: The aortic root is normal in size and structure. Venous: The inferior vena cava is dilated in size with greater than 50% respiratory variability, suggesting right atrial pressure of 8 mmHg. IAS/Shunts: The interatrial septum was not well visualized.  LEFT VENTRICLE PLAX 2D LVIDd:         4.00 cm  Diastology LVIDs:         3.00 cm  LV e' medial:    5.11 cm/s LV PW:         1.10 cm  LV E/e' medial:  16.9 LV IVS:        1.10 cm  LV e' lateral:   6.20 cm/s LVOT diam:     1.40 cm  LV E/e' lateral: 13.9 LV SV:         48 LV SV Index:   29 LVOT Area:     1.54 cm  RIGHT VENTRICLE RV S prime:     22.80 cm/s TAPSE (M-mode): 2.1 cm LEFT ATRIUM             Index       RIGHT ATRIUM           Index LA diam:        2.70 cm 1.63 cm/m  RA Area:     17.20 cm LA Vol (A2C):   35.9 ml 21.69 ml/m RA Volume:   42.00 ml  25.38 ml/m LA Vol (A4C):   29.9 ml 18.07 ml/m LA Biplane Vol: 34.6 ml 20.91 ml/m  AORTIC VALVE  LVOT Vmax:   171.00 cm/s LVOT Vmean:  126.333 cm/s LVOT VTI:    0.311 m  AORTA Ao Root diam: 3.40 cm MITRAL VALVE MV Area (PHT): 4.33 cm     SHUNTS MV Decel Time: 175 msec     Systemic VTI:  0.31 m MV E velocity: 86.40 cm/s   Systemic Diam: 1.40 cm MV A velocity: 144.00 cm/s MV E/A ratio:  0.60 Jenkins Rouge MD Electronically signed by Jenkins Rouge MD Signature Date/Time: 02/26/2021/4:34:15 PM    Final      CBC Recent Labs  Lab 03/12/2021 2005 02/26/21 0534  WBC 17.2* 15.1*  HGB 10.2* 9.5*  HCT 31.9* 30.3*  PLT 375 327  MCV 94.7 95.9  MCH 30.3 30.1  MCHC 32.0 31.4  RDW 15.0 15.1  LYMPHSABS 0.9  --   MONOABS 0.7  --   EOSABS 0.1  --   BASOSABS 0.0  --     Chemistries  Recent Labs  Lab 02/23/2021 2005 02/26/21 0534  NA 129* 134*  K 4.5 3.8  CL 92* 100  CO2 25 22  GLUCOSE 89 78  BUN 72* 70*  CREATININE 2.53* 2.17*  CALCIUM 8.6* 8.2*  MG  --  2.0  AST 14* 13*  ALT 9 8  ALKPHOS 80 69  BILITOT 0.7 0.7   ------------------------------------------------------------------------------------------------------------------ No results for input(s): CHOL, HDL, LDLCALC, TRIG, CHOLHDL, LDLDIRECT in the last 72 hours.  No results found for: HGBA1C ------------------------------------------------------------------------------------------------------------------ Recent Labs    02/22/2021 2005  TSH 4.023   ------------------------------------------------------------------------------------------------------------------ No results for input(s): VITAMINB12, FOLATE, FERRITIN, TIBC, IRON, RETICCTPCT in the last 72 hours.  Coagulation profile Recent Labs  Lab 03/09/2021 2005 02/26/21 0534  INR 1.2 1.3*    No results for input(s): DDIMER in the last 72 hours.  Cardiac Enzymes No results for input(s): CKMB, TROPONINI, MYOGLOBIN in the last 168 hours.  Invalid input(s):  CK ------------------------------------------------------------------------------------------------------------------    Component Value Date/Time   BNP 400.0 (H) 03/04/2021 2005     Roxan Hockey M.D on 02/26/2021 at 4:50 PM  Go to [email or web address] - for contact info  [facility] - Office  [phone]

## 2021-02-26 NOTE — Plan of Care (Signed)

## 2021-02-26 NOTE — Progress Notes (Signed)
Admitted from ED. Skin check done by 2 RNs ,several wounds noted. Dependent on staff for adls. Foley in place.Speeck not very clear. Able to make some needs known though but not some. Will continue to monitor.

## 2021-02-27 ENCOUNTER — Inpatient Hospital Stay (HOSPITAL_COMMUNITY): Payer: Medicare Other

## 2021-02-27 DIAGNOSIS — G9341 Metabolic encephalopathy: Secondary | ICD-10-CM | POA: Diagnosis not present

## 2021-02-27 DIAGNOSIS — I10 Essential (primary) hypertension: Secondary | ICD-10-CM | POA: Diagnosis not present

## 2021-02-27 DIAGNOSIS — N179 Acute kidney failure, unspecified: Secondary | ICD-10-CM | POA: Diagnosis not present

## 2021-02-27 DIAGNOSIS — A419 Sepsis, unspecified organism: Secondary | ICD-10-CM | POA: Diagnosis not present

## 2021-02-27 MED ORDER — ADULT MULTIVITAMIN W/MINERALS CH
1.0000 | ORAL_TABLET | Freq: Every day | ORAL | Status: DC
  Administered 2021-02-27 – 2021-03-05 (×5): 1 via ORAL
  Filled 2021-02-27 (×9): qty 1

## 2021-02-27 MED ORDER — JUVEN PO PACK
1.0000 | PACK | Freq: Two times a day (BID) | ORAL | Status: DC
  Administered 2021-02-28 – 2021-03-05 (×7): 1 via ORAL
  Filled 2021-02-27 (×9): qty 1

## 2021-02-27 MED ORDER — ENSURE ENLIVE PO LIQD
237.0000 mL | Freq: Three times a day (TID) | ORAL | Status: DC
  Administered 2021-02-27 – 2021-03-05 (×10): 237 mL via ORAL
  Filled 2021-02-27 (×2): qty 237

## 2021-02-27 NOTE — Progress Notes (Signed)
Daughter [name] contacted to be informed of pt's decrease in appetite. Daughter states she doesn't eat much at home only things that she takes a liking too. In report I got that pt didn't eat yesterday due to refusal. I tried to encourage pt to eat breakfast this am and pt refused.

## 2021-02-27 NOTE — Progress Notes (Signed)
Patient Demographics:    [name], is a 87 y.o. female, DOB - 15-Nov-1929, XNT:[id number]  Admit date - 02/23/2021   Admitting Physician Bernadette Hoit, DO  Outpatient Primary MD for the patient is Rosita Fire, MD  LOS - 2   Chief Complaint  Patient presents with  . Weakness        Subjective:    Herbert Pun Barno today has no fevers, no emesis,  No chest pain,   ---daughter [name] at bedside -Patient resting comfortably, no new concerns  Assessment  & Plan :    Principal Problem:   Sepsis due to left leg cellulitis and possible UTI Active Problems:   Acute metabolic encephalopathy   Paroxysmal A-fib (HCC)   Essential hypertension   PERIPHERAL VASCULAR DISEASE   GERD   Hypothyroidism   Acute kidney injury (HCC)   Pressure injury of skin   Generalized weakness   Leukocytosis   Hyponatremia   Dehydration   Hypoalbuminemia   Elevated brain natriuretic peptide (BNP) level   Brief Summary:-  87 y.o. female with medical history significant for hypothyroidism, hypertension, PVD and arthritis admitted on 03/07/2021 with increasing confusion, generalized weakness and found to have sepsis secondary to left lower extremity cellulitis and possible UTI   A/p 1) sepsis secondary to left leg cellulitis and possible UTI--- x-rays of left knee with effusion concerning for possible septic joint -Please see photos in epic -Leukocytosis, tachycardia and tachypnea noted on admission -BP improved with IV fluid -Continue IV Vanco and cefepime pending further culture data -Overall sepsis pathophysiology appears to be resolving no further tachycardia no further tachypnea  2)PAFib-- - -Continue metoprolol for rate control -Bedside benefit of Full anticoagulation discussed with patient's daughter [name] at bedside -She declined full anticoagulation at this time  3) acute metabolic encephalopathy---  superimposed on underlying dementia -As per patient's daughter this is different from patient's baseline -This is most likely due to #1 above  4)PAD--continue aspirin and Pletal  5) history of B12 and iron deficiency anemia--- c/n  B12 tablets and  iron sulfate  6)Dementia with baseline cognitive and memory deficits--- Xanax as needed  7)Lt Knee-Pain--status post periprosthetic distal femur fracture--status post operative treatment on 01/14/2021---  by Dr. Lyla Glassing, -Left knee x-rays with possible septic knee/effusion  8)AKI----acute kidney injury   -Creatinine is down to 2.1 from 2.5 on admission with hydration --renally adjust medications, avoid nephrotoxic agents / dehydration  / hypotension  Disposition/Need for in-Hospital Stay- patient unable to be discharged at this time due to sepsis with hemodynamic concerns requiring IV antibiotics and IV fluids  Status is: Inpatient  Remains inpatient appropriate because:Please see above   Disposition: The patient is from: Home              Anticipated d/c is to: Home              Anticipated d/c date is: 2 days              Patient currently is not medically stable to d/c. Barriers: Not Clinically Stable-   Code Status :  -  Code Status: Full Code   Family Communication:   Discussed with daughter [name] at bedside  Consults  :    DVT Prophylaxis  :   -  SCDs \  enoxaparin (LOVENOX) injection 30 mg Start: 02/26/21 1000 SCDs Start: 02/26/21 0147    Lab Results  Component Value Date   PLT 327 02/26/2021    Inpatient Medications  Scheduled Meds: . aspirin EC  81 mg Oral Q breakfast  . Chlorhexidine Gluconate Cloth  6 each Topical Daily  . cilostazol  100 mg Oral BID  . enoxaparin (LOVENOX) injection  30 mg Subcutaneous Q24H  . feeding supplement  237 mL Oral TID BM  . metoprolol tartrate  12.5 mg Oral BID  . multivitamin with minerals  1 tablet Oral Daily  . [START ON 02/28/2021] nutrition supplement (JUVEN)  1 packet Oral  BID BM  . pantoprazole (PROTONIX) IV  40 mg Intravenous Q24H  . vitamin B-12  1,000 mcg Oral Daily   Continuous Infusions: . sodium chloride 50 mL/hr at 02/27/21 0317  . ceFEPime (MAXIPIME) IV Stopped (02/26/21 2302)  . vancomycin     PRN Meds:.ALPRAZolam, HYDROcodone-acetaminophen    Anti-infectives (From admission, onward)   Start     Dose/Rate Route Frequency Ordered Stop   02/27/21 2200  vancomycin (VANCOREADY) IVPB 750 mg/150 mL        750 mg 150 mL/hr over 60 Minutes Intravenous Every 48 hours 03/09/2021 2342     03/09/2021 2315  vancomycin (VANCOCIN) IVPB 1000 mg/200 mL premix        1,000 mg 200 mL/hr over 60 Minutes Intravenous  Once 03/12/2021 2313 02/26/21 0115   03/05/2021 2315  ceFEPIme (MAXIPIME) 2 g in sodium chloride 0.9 % 100 mL IVPB        2 g 200 mL/hr over 30 Minutes Intravenous Every 24 hours 03/17/2021 2313          Objective:   Vitals:   02/27/21 0208 02/27/21 0627 02/27/21 0839 02/27/21 1418  BP: (!) 111/50 111/89  (!) 118/58  Pulse: 80 84 100 83  Resp: 14 18  16   Temp: 98.5 F (36.9 C) (!) 97.4 F (36.3 C)  98.2 F (36.8 C)  TempSrc: Oral Oral  Oral  SpO2: 98% 93%  99%  Weight:      Height:        Wt Readings from Last 3 Encounters:  02/26/21 61.2 kg  01/17/21 63.6 kg  04/29/14 84.4 kg     Intake/Output Summary (Last 24 hours) at 02/27/2021 2023 Last data filed at 02/27/2021 1700 Gross per 24 hour  Intake 754.96 ml  Output 200 ml  Net 554.96 ml     Physical Exam  Gen:- Awake Alert,  In no apparent distress  HEENT:- [place].AT, No sclera icterus Neck-Supple Neck,No JVD,.  Lungs-  CTAB , fair symmetrical air movement CV- S1, S2 normal, irregular  abd-  +ve B.Sounds, Abd Soft, No tenderness,    Extremity-- pedal pulses present  Psych-affect is flat,, baseline cognitive and memory deficits noted Neuro-generalized weakness, no new focal deficits, no tremors Lt Leg/Knee --with swelling, erythema and warmth consistent with cellulitis, please see  photos in epic -Skin-sacral decubitus ulcers as well as right lateral malleolus ulcer without superimposed cellulitis   Data Review:   Micro Results Recent Results (from the past 240 hour(s))  Blood culture (routine single)     Status: None (Preliminary result)   Collection Time: 02/26/2021  8:57 PM   Specimen: Right Antecubital; Blood  Result Value Ref Range Status   Specimen Description RIGHT ANTECUBITAL  Final   Special Requests   Final    BOTTLES DRAWN AEROBIC AND ANAEROBIC Blood  Culture adequate volume   Culture   Final    NO GROWTH 2 DAYS Performed at [facility], [address], [place], [place] [address]    Report Status PENDING  Incomplete  Resp Panel by RT-PCR (Flu A&B, Covid) Nasopharyngeal Swab     Status: None   Collection Time: 02/20/2021  9:00 PM   Specimen: Nasopharyngeal Swab; Nasopharyngeal(NP) swabs in vial transport medium  Result Value Ref Range Status   SARS Coronavirus 2 by RT PCR NEGATIVE NEGATIVE Final    Comment: (NOTE) SARS-CoV-2 target nucleic acids are NOT DETECTED.  The SARS-CoV-2 RNA is generally detectable in upper respiratory specimens during the acute phase of infection. The lowest concentration of SARS-CoV-2 viral copies this assay can detect is 138 copies/mL. A negative result does not preclude SARS-Cov-2 infection and should not be used as the sole basis for treatment or other patient management decisions. A negative result may occur with  improper specimen collection/handling, submission of specimen other than nasopharyngeal swab, presence of viral mutation(s) within the areas targeted by this assay, and inadequate number of viral copies(<138 copies/mL). A negative result must be combined with clinical observations, patient history, and epidemiological information. The expected result is Negative.  Fact Sheet for Patients:  EntrepreneurPulse.com.au  Fact Sheet for Healthcare Providers:   IncredibleEmployment.be  This test is no t yet approved or cleared by the [place] FDA and  has been authorized for detection and/or diagnosis of SARS-CoV-2 by FDA under an Emergency Use Authorization (EUA). This EUA will remain  in effect (meaning this test can be used) for the duration of the COVID-19 declaration under Section 564(b)(1) of the Act, 21 U.S.C.section 360bbb-3(b)(1), unless the authorization is terminated  or revoked sooner.       Influenza A by PCR NEGATIVE NEGATIVE Final   Influenza B by PCR NEGATIVE NEGATIVE Final    Comment: (NOTE) The Xpert Xpress SARS-CoV-2/FLU/RSV plus assay is intended as an aid in the diagnosis of influenza from Nasopharyngeal swab specimens and should not be used as a sole basis for treatment. Nasal washings and aspirates are unacceptable for Xpert Xpress SARS-CoV-2/FLU/RSV testing.  Fact Sheet for Patients: EntrepreneurPulse.com.au  Fact Sheet for Healthcare Providers: IncredibleEmployment.be  This test is not yet approved or cleared by the [place] FDA and has been authorized for detection and/or diagnosis of SARS-CoV-2 by FDA under an Emergency Use Authorization (EUA). This EUA will remain in effect (meaning this test can be used) for the duration of the COVID-19 declaration under Section 564(b)(1) of the Act, 21 U.S.C. section 360bbb-3(b)(1), unless the authorization is terminated or revoked.  Performed at [facility], [address], [place], [place] [address]   Urine culture     Status: None (Preliminary result)   Collection Time: 02/23/2021 11:57 PM   Specimen: In/Out Cath Urine  Result Value Ref Range Status   Specimen Description   Final    IN/OUT CATH URINE Performed at [facility], [address] [place], [place] [address]    Special Requests   Final    NONE Performed at [facility], [address], [place], [place] [address]    Culture   Final     CULTURE REINCUBATED FOR BETTER GROWTH Performed at [facility], [place] [address], [place], [place] [address]    Report Status PENDING  Incomplete    Radiology Reports CT Head Wo Contrast  Result Date: 02/28/2021 CLINICAL DATA:  Altered mental status. Increased weakness. Decreased appetite. EXAM: CT HEAD WITHOUT  CONTRAST TECHNIQUE: Contiguous axial images were obtained from the base of the skull through the vertex without intravenous contrast. COMPARISON:  None. FINDINGS: Brain: Moderately enlarged ventricles and subarachnoid spaces. Moderate to marked patchy white matter low density in both cerebral hemispheres. There is low density in the lateral aspect of the pons on the right and in the right cerebellar hemisphere. These are in areas of streak artifacts produced by the skull base and have an appearance suggesting artifacts on the reconstructed sagittal and coronal images. Otherwise, no intracranial hemorrhage, mass lesion or CT evidence of acute infarction. Vascular: No hyperdense vessel or unexpected calcification. Skull: Normal. Negative for fracture or focal lesion. Sinuses/Orbits: Status post bilateral cataract extraction. Opacified left posterior ethmoid sinus. Other: None. IMPRESSION: 1. No acute abnormality. 2. Moderate diffuse cerebral and cerebellar atrophy. 3. Moderate to marked chronic small vessel white matter ischemic changes in both cerebral hemispheres. 4. Probable artifacts in the lateral aspect of the pons on the right and in the right cerebellar hemisphere. Areas of acute infarction are significantly less likely. 5. Chronic left posterior ethmoid sinusitis. Electronically Signed   By: Claudie Revering M.D.   On: 03/15/2021 23:15   US Venous Img Lower Bilateral (DVT)  Result Date: 02/27/2021 CLINICAL DATA:  87 year old female with leg swelling. EXAM: BILATERAL LOWER EXTREMITY VENOUS DOPPLER ULTRASOUND TECHNIQUE: Gray-scale sonography with graded compression, as well as color Doppler  and duplex ultrasound were performed to evaluate the lower extremity deep venous systems from the level of the common femoral vein and including the common femoral, femoral, profunda femoral, popliteal and calf veins including the posterior tibial, peroneal and gastrocnemius veins when visible. The superficial great saphenous vein was also interrogated. Spectral Doppler was utilized to evaluate flow at rest and with distal augmentation maneuvers in the common femoral, femoral and popliteal veins. COMPARISON:  None. FINDINGS: RIGHT LOWER EXTREMITY Common Femoral Vein: No evidence of thrombus. Normal compressibility, respiratory phasicity and response to augmentation. Saphenofemoral Junction: No evidence of thrombus. Normal compressibility and flow on color Doppler imaging. Profunda Femoral Vein: No evidence of thrombus. Normal compressibility and flow on color Doppler imaging. Femoral Vein: No evidence of thrombus. Normal compressibility, respiratory phasicity and response to augmentation. Popliteal Vein: No evidence of thrombus. Normal compressibility, respiratory phasicity and response to augmentation. Calf Veins: No evidence of thrombus. Normal compressibility and flow on color Doppler imaging. Other Findings:  None. LEFT LOWER EXTREMITY Common Femoral Vein: No evidence of thrombus. Normal compressibility, respiratory phasicity and response to augmentation. Saphenofemoral Junction: No evidence of thrombus. Normal compressibility and flow on color Doppler imaging. Profunda Femoral Vein: No evidence of thrombus. Normal compressibility and flow on color Doppler imaging. Femoral Vein: No evidence of thrombus. Normal compressibility, respiratory phasicity and response to augmentation. Popliteal Vein: No evidence of thrombus. Normal compressibility, respiratory phasicity and response to augmentation. Calf Veins: No evidence of thrombus. Normal compressibility and flow on color Doppler imaging. Other Findings:  None.  IMPRESSION: No evidence of bilateral lower extremity deep venous thrombosis. Ruthann Cancer, MD [facility] Radiology Electronically Signed   By: Ruthann Cancer MD   On: 02/27/2021 11:58   DG Chest Port 1 View  Result Date: 03/02/2021 CLINICAL DATA:  Weakness and decreased appetite. Recent knee surgery. EXAM: PORTABLE CHEST 1 VIEW COMPARISON:  01/10/2021 FINDINGS: Stable enlarged cardiac silhouette and tortuous thoracic aorta. Clear lungs with normal vascularity. Left shoulder prosthesis. Moderate thoracolumbar scoliosis. IMPRESSION: No acute abnormality. Stable cardiomegaly. Electronically Signed   By: Remo Lipps  Joneen Caraway M.D.   On: 03/15/2021 21:27   DG Knee Complete 4 Views Left  Result Date: 02/26/2021 CLINICAL DATA:  History of surgery EXAM: LEFT KNEE - COMPLETE 4+ VIEW COMPARISON:  01/14/2021 FINDINGS: Status post left knee replacement with resection of distal femur and long-stem prosthesis in the femur and tibia. No acute fracture is visualized. Considerable soft tissue swelling at the knee and distal thigh. Suspected small gas at the suprapatellar joint space. Vascular calcifications. IMPRESSION: Status post left knee replacement with intact hardware and normal alignment. Considerable soft tissue swelling of the knee and distal thigh, correlate for any symptoms of infection. Positive for knee effusion. Small air-fluid level at the suprapatellar joint space, correlate for any history of recent knee aspiration. Electronically Signed   By: Donavan Foil M.D.   On: 02/26/2021 20:22   ECHOCARDIOGRAM COMPLETE  Result Date: 02/26/2021    ECHOCARDIOGRAM REPORT   Patient Name:   EIMY PLAZA Date of Exam: 02/26/2021 Medical Rec #:  [id number]      Height:       64.0 in Accession #:    [id number]     Weight:       134.9 lb Date of Birth:  03-10-1929     BSA:          1.655 m Patient Age:    73 years       BP:           101/64 mmHg Patient Gender: F              HR:            98 bpm. Exam Location:  [facility] Procedure: 2D Echo, Cardiac Doppler and Color Doppler Indications:    CHF-Acute Diastolic O84.16  History:        Patient has no prior history of Echocardiogram examinations.                 COPD; Risk Factors:Hypertension and Dyslipidemia. Elevated BNP,                 Sepsis.  Sonographer:    Alvino Chapel RCS Referring Phys: [id number] OLADAPO ADEFESO IMPRESSIONS  1. Small LV cavity with very hyperdynamic function consider volume or beta blockade if clinically indicated . Left ventricular ejection fraction, by estimation, is 60 to 65%. The left ventricle has normal function. The left ventricle has no regional wall motion abnormalities. Left ventricular diastolic parameters are consistent with Grade I diastolic dysfunction (impaired relaxation).  2. Right ventricular systolic function is normal. The right ventricular size is normal.  3. The mitral valve is degenerative. No evidence of mitral valve regurgitation. No evidence of mitral stenosis. Moderate mitral annular calcification.  4. The aortic valve was not well visualized. There is moderate calcification of the aortic valve. Aortic valve regurgitation is not visualized. Mild to moderate aortic valve sclerosis/calcification is present, without any evidence of aortic stenosis.  5. The inferior vena cava is dilated in size with >50% respiratory variability, suggesting right atrial pressure of 8 mmHg. FINDINGS  Left Ventricle: Small LV cavity with very hyperdynamic function consider volume or beta blockade if clinically indicated. Left ventricular ejection fraction, by estimation, is 60 to 65%. The left ventricle has normal function. The left ventricle has no regional wall motion abnormalities. The left ventricular internal cavity size was normal in size. There is no left ventricular hypertrophy. Left ventricular diastolic parameters are consistent with Grade I diastolic dysfunction (impaired relaxation). Right Ventricle:  The  right ventricular size is normal. No increase in right ventricular wall thickness. Right ventricular systolic function is normal. Left Atrium: Left atrial size was normal in size. Right Atrium: Right atrial size was normal in size. Pericardium: There is no evidence of pericardial effusion. Mitral Valve: The mitral valve is degenerative in appearance. There is mild thickening of the mitral valve leaflet(s). There is mild calcification of the mitral valve leaflet(s). Moderate mitral annular calcification. No evidence of mitral valve regurgitation. No evidence of mitral valve stenosis. Tricuspid Valve: The tricuspid valve is normal in structure. Tricuspid valve regurgitation is not demonstrated. No evidence of tricuspid stenosis. Aortic Valve: The aortic valve was not well visualized. There is moderate calcification of the aortic valve. Aortic valve regurgitation is not visualized. Mild to moderate aortic valve sclerosis/calcification is present, without any evidence of aortic stenosis. Pulmonic Valve: The pulmonic valve was normal in structure. Pulmonic valve regurgitation is not visualized. No evidence of pulmonic stenosis. Aorta: The aortic root is normal in size and structure. Venous: The inferior vena cava is dilated in size with greater than 50% respiratory variability, suggesting right atrial pressure of 8 mmHg. IAS/Shunts: The interatrial septum was not well visualized.  LEFT VENTRICLE PLAX 2D LVIDd:         4.00 cm  Diastology LVIDs:         3.00 cm  LV e' medial:    5.11 cm/s LV PW:         1.10 cm  LV E/e' medial:  16.9 LV IVS:        1.10 cm  LV e' lateral:   6.20 cm/s LVOT diam:     1.40 cm  LV E/e' lateral: 13.9 LV SV:         48 LV SV Index:   29 LVOT Area:     1.54 cm  RIGHT VENTRICLE RV S prime:     22.80 cm/s TAPSE (M-mode): 2.1 cm LEFT ATRIUM             Index       RIGHT ATRIUM           Index LA diam:        2.70 cm 1.63 cm/m  RA Area:     17.20 cm LA Vol (A2C):   35.9 ml 21.69 ml/m RA  Volume:   42.00 ml  25.38 ml/m LA Vol (A4C):   29.9 ml 18.07 ml/m LA Biplane Vol: 34.6 ml 20.91 ml/m  AORTIC VALVE LVOT Vmax:   171.00 cm/s LVOT Vmean:  126.333 cm/s LVOT VTI:    0.311 m  AORTA Ao Root diam: 3.40 cm MITRAL VALVE MV Area (PHT): 4.33 cm     SHUNTS MV Decel Time: 175 msec     Systemic VTI:  0.31 m MV E velocity: 86.40 cm/s   Systemic Diam: 1.40 cm MV A velocity: 144.00 cm/s MV E/A ratio:  0.60 Jenkins Rouge MD Electronically signed by Jenkins Rouge MD Signature Date/Time: 02/26/2021/4:34:15 PM    Final      CBC Recent Labs  Lab 03/06/2021 2005 02/26/21 0534  WBC 17.2* 15.1*  HGB 10.2* 9.5*  HCT 31.9* 30.3*  PLT 375 327  MCV 94.7 95.9  MCH 30.3 30.1  MCHC 32.0 31.4  RDW 15.0 15.1  LYMPHSABS 0.9  --   MONOABS 0.7  --   EOSABS 0.1  --   BASOSABS 0.0  --     Chemistries  Recent Labs  Lab 03/14/2021 2005 02/26/21 0534  NA 129* 134*  K 4.5 3.8  CL 92* 100  CO2 25 22  GLUCOSE 89 78  BUN 72* 70*  CREATININE 2.53* 2.17*  CALCIUM 8.6* 8.2*  MG  --  2.0  AST 14* 13*  ALT 9 8  ALKPHOS 80 69  BILITOT 0.7 0.7   ------------------------------------------------------------------------------------------------------------------ No results for input(s): CHOL, HDL, LDLCALC, TRIG, CHOLHDL, LDLDIRECT in the last 72 hours.  No results found for: HGBA1C ------------------------------------------------------------------------------------------------------------------ Recent Labs    03/15/2021 2005  TSH 4.023   ------------------------------------------------------------------------------------------------------------------ No results for input(s): VITAMINB12, FOLATE, FERRITIN, TIBC, IRON, RETICCTPCT in the last 72 hours.  Coagulation profile Recent Labs  Lab 02/23/2021 2005 02/26/21 0534  INR 1.2 1.3*    No results for input(s): DDIMER in the last 72 hours.  Cardiac Enzymes No results for input(s): CKMB, TROPONINI, MYOGLOBIN in the last 168 hours.  Invalid input(s):  CK ------------------------------------------------------------------------------------------------------------------    Component Value Date/Time   BNP 400.0 (H) 03/08/2021 2005   Roxan Hockey M.D on 02/27/2021 at 8:23 PM  Go to [email or web address] - for contact info  [facility] - Office  [phone]

## 2021-02-27 NOTE — Plan of Care (Signed)
Alert and oriented to self with some confusion. Denies pain at rest but c/o ain with movement or repositioning. Poor appetite. Refuses care and medication and only agrees after long period of asking her to consent. Bed alarm in use. Air loss mattress bed now in use.  Problem: Education: Goal: Knowledge of General Education information will improve Description: Including pain rating scale, medication(s)/side effects and non-pharmacologic comfort measures Outcome: Progressing   Problem: Health Behavior/Discharge Planning: Goal: Ability to manage health-related needs will improve Outcome: Progressing   Problem: Clinical Measurements: Goal: Ability to maintain clinical measurements within normal limits will improve Outcome: Progressing Goal: Will remain free from infection Outcome: Progressing Goal: Diagnostic test results will improve Outcome: Progressing Goal: Respiratory complications will improve Outcome: Progressing Goal: Cardiovascular complication will be avoided Outcome: Progressing   Problem: Activity: Goal: Risk for activity intolerance will decrease Outcome: Progressing   Problem: Nutrition: Goal: Adequate nutrition will be maintained Outcome: Progressing   Problem: Coping: Goal: Level of anxiety will decrease Outcome: Progressing   Problem: Elimination: Goal: Will not experience complications related to bowel motility Outcome: Progressing Goal: Will not experience complications related to urinary retention Outcome: Progressing   Problem: Pain Managment: Goal: General experience of comfort will improve Outcome: Progressing   Problem: Safety: Goal: Ability to remain free from injury will improve Outcome: Progressing   Problem: Skin Integrity: Goal: Risk for impaired skin integrity will decrease Outcome: Progressing

## 2021-02-27 NOTE — Progress Notes (Signed)
Initial Nutrition Assessment  DOCUMENTATION CODES:      INTERVENTION:  -1 packet Juven BID, each packet provides 95 calories, 2.5 grams of protein (collagen)  -Ensure Enlive po TID, each supplement provides 350 kcal and 20 grams of protein  -Feeding assistance all meals   -Consider appetite medication  NUTRITION DIAGNOSIS:  Increased nutrient needs related to wound healing (3 stage 4 PI and a DTI to left heel) as evidenced by estimated needs.  GOAL:  Patient will meet greater than or equal to 90% of their needs  MONITOR:  PO intake,Supplement acceptance,Labs,Skin,Weight trends  REASON FOR ASSESSMENT:   Malnutrition Screening Tool    ASSESSMENT: Patient is a 87 yo female with presents with multiple wounds chronic stage 4 PI x 2, right ankle stage 4,  left heel DTI. History of PVD, HTN, hypothyroidism.   Patient with swallow difficulty prior to admission. ST evaluation -diet advanced to ground textures and thin liquids. Not eating per nursing- meal intake 0%. Daughter at bedside. Patient is agreeable to drink ONS and likes vanilla flavor.   Weight loss per review: (01/17/21) 63.6 kg. Currently 61.2 kg. Not significant for timeframe. Reports usual weight in 130's.   Meds: lopressor, protonix, B-12  IV-NS@50  ml/hr Maxipime Vancomycin  Labs: BMP Latest Ref Rng & Units 02/26/2021 02/21/2021 01/17/2021  Glucose 70 - 99 mg/dL 78 89 92  BUN 8 - 23 mg/dL 70(H) 72(H) 32(H)  Creatinine 0.44 - 1.00 mg/dL 2.17(H) 2.53(H) 1.18(H)  Sodium 135 - 145 mmol/L 134(L) 129(L) 138  Potassium 3.5 - 5.1 mmol/L 3.8 4.5 3.5  Chloride 98 - 111 mmol/L 100 92(L) 108  CO2 22 - 32 mmol/L 22 25 22   Calcium 8.9 - 10.3 mg/dL 8.2(L) 8.6(L) 8.3(L)     NUTRITION - FOCUSED PHYSICAL EXAM:  Flowsheet Row Most Recent Value  Orbital Region No depletion  Upper Arm Region No depletion  Thoracic and Lumbar Region No depletion  Buccal Region No depletion  Temple Region No depletion  Clavicle Bone Region  Moderate depletion  Clavicle and Acromion Bone Region Mild depletion  Scapular Bone Region Unable to assess  Dorsal Hand Unable to assess  Patellar Region No depletion  Anterior Thigh Region Unable to assess  Posterior Calf Region Unable to assess  Edema (RD Assessment) Moderate  Hair Reviewed  Eyes Reviewed  Mouth Reviewed  Skin Reviewed  Nails Unable to assess     Diet Order:   Diet Order            DIET DYS 2 Room service appropriate? Yes; Fluid consistency: Thin  Diet effective now                 EDUCATION NEEDS:  Education needs have been addressed  Skin:  Skin Assessment: Skin Integrity Issues: Skin Integrity Issues:: Stage IV Stage IV: 2 sacral stge 4 and a stage 4 to right ankle, DTI left heel  Last BM:  Prior to admission  Height:   Ht Readings from Last 1 Encounters:  02/26/21 5' 4.02" (1.626 m)    Weight:   Wt Readings from Last 1 Encounters:  02/26/21 61.2 kg    Ideal Body Weight:   55 kg   BMI:  Body mass index is 23.15 kg/m.  Estimated Nutritional Needs:   Kcal:  9833-8250  Protein:  103-110 gr  Fluid:  >1500 ml daily   Colman Cater MS,RD,CSG,LDN Contact: Shea Evans

## 2021-02-28 DIAGNOSIS — G9341 Metabolic encephalopathy: Secondary | ICD-10-CM | POA: Diagnosis not present

## 2021-02-28 DIAGNOSIS — A419 Sepsis, unspecified organism: Secondary | ICD-10-CM | POA: Diagnosis not present

## 2021-02-28 DIAGNOSIS — N179 Acute kidney failure, unspecified: Secondary | ICD-10-CM | POA: Diagnosis not present

## 2021-02-28 DIAGNOSIS — E871 Hypo-osmolality and hyponatremia: Secondary | ICD-10-CM | POA: Diagnosis not present

## 2021-02-28 LAB — BASIC METABOLIC PANEL
Anion gap: 9 (ref 5–15)
BUN: 68 mg/dL — ABNORMAL HIGH (ref 8–23)
CO2: 22 mmol/L (ref 22–32)
Calcium: 8 mg/dL — ABNORMAL LOW (ref 8.9–10.3)
Chloride: 102 mmol/L (ref 98–111)
Creatinine, Ser: 2.04 mg/dL — ABNORMAL HIGH (ref 0.44–1.00)
GFR, Estimated: 23 mL/min — ABNORMAL LOW (ref >60)
Glucose, Bld: 86 mg/dL (ref 70–99)
Potassium: 3.4 mmol/L — ABNORMAL LOW (ref 3.5–5.1)
Sodium: 133 mmol/L — ABNORMAL LOW (ref 135–145)

## 2021-02-28 LAB — CBC
HCT: 26.6 % — ABNORMAL LOW (ref 36.0–46.0)
Hemoglobin: 8.5 g/dL — ABNORMAL LOW (ref 12.0–15.0)
MCH: 30.4 pg (ref 26.0–34.0)
MCHC: 32 g/dL (ref 30.0–36.0)
MCV: 95 fL (ref 80.0–100.0)
Platelets: 290 10*3/uL (ref 150–400)
RBC: 2.8 MIL/uL — ABNORMAL LOW (ref 3.87–5.11)
RDW: 15.3 % (ref 11.5–15.5)
WBC: 18.7 10*3/uL — ABNORMAL HIGH (ref 4.0–10.5)
nRBC: 0 % (ref 0.0–0.2)

## 2021-02-28 LAB — SEDIMENTATION RATE: Sed Rate: 58 mm/hr — ABNORMAL HIGH (ref 0–22)

## 2021-02-28 LAB — MAGNESIUM: Magnesium: 1.9 mg/dL (ref 1.7–2.4)

## 2021-02-28 LAB — C-REACTIVE PROTEIN: CRP: 14.2 mg/dL — ABNORMAL HIGH (ref <1.0)

## 2021-02-28 MED ORDER — LEVOTHYROXINE SODIUM 100 MCG PO TABS
100.0000 ug | ORAL_TABLET | Freq: Every day | ORAL | Status: DC
  Administered 2021-03-01 – 2021-03-10 (×4): 100 ug via ORAL
  Filled 2021-02-28 (×4): qty 1

## 2021-02-28 MED ORDER — ENOXAPARIN SODIUM 30 MG/0.3ML ~~LOC~~ SOLN
30.0000 mg | SUBCUTANEOUS | Status: DC
  Administered 2021-03-02: 30 mg via SUBCUTANEOUS
  Filled 2021-02-28: qty 0.3

## 2021-02-28 MED ORDER — POTASSIUM CHLORIDE 20 MEQ PO PACK
40.0000 meq | PACK | Freq: Two times a day (BID) | ORAL | Status: AC
  Administered 2021-02-28 (×2): 40 meq via ORAL
  Filled 2021-02-28 (×2): qty 2

## 2021-02-28 MED ORDER — FERROUS SULFATE 325 (65 FE) MG PO TABS
325.0000 mg | ORAL_TABLET | Freq: Every day | ORAL | Status: DC
  Administered 2021-03-01 – 2021-03-05 (×3): 325 mg via ORAL
  Filled 2021-02-28 (×4): qty 1

## 2021-02-28 MED ORDER — PANTOPRAZOLE SODIUM 40 MG PO TBEC
40.0000 mg | DELAYED_RELEASE_TABLET | Freq: Every day | ORAL | Status: DC
  Administered 2021-03-01 – 2021-03-02 (×2): 40 mg via ORAL
  Filled 2021-02-28 (×3): qty 1

## 2021-02-28 NOTE — Progress Notes (Addendum)
Patient Demographics:    [name], is a 87 y.o. female, DOB - 1929/03/27, VXY:[id number]  Admit date - 03/16/2021   Admitting Physician Bernadette Hoit, DO  Outpatient Primary MD for the patient is Rosita Fire, MD  LOS - 3   Chief Complaint  Patient presents with  . Weakness        Subjective:    [name] today has no fevers, no emesis,  No chest pain,   ---daughter [name] is at bedside -Patient resting comfortably, no new concerns  Assessment  & Plan :    Principal Problem:   Severe Sepsis due to left leg cellulitis and possible Lt septic knee and Enterococcus UTI Active Problems:   Acute metabolic encephalopathy   Paroxysmal A-fib (HCC)   Essential hypertension   PERIPHERAL VASCULAR DISEASE   GERD   Hypothyroidism   Acute kidney injury (HCC)   Pressure injury of skin   Generalized weakness   Leukocytosis   Hyponatremia   Dehydration   Hypoalbuminemia   Elevated brain natriuretic peptide (BNP) level   Brief Summary:-  87 y.o. female with medical history significant for hypothyroidism, hypertension, PVD and arthritis admitted on 03/11/2021 with increasing confusion, generalized weakness and found to have sepsis secondary to left lower extremity cellulitis, possible left septic knee and Enterococcus Avium UTI --CRP 14.2, ESR 58, leukocytosis with WBC of 18.7--- concerns for left septic knee -Transfer to [facility] (Dr Lyla Glassing) to see   A/p 1)Severe Sepsis secondary to left leg cellulitis and possible Lt septic knee as well as Enterococcus UTI--- with AKI and borderline low BP --x-rays of left knee with effusion concerning for possible septic joint -Please see photos in epic -Leukocytosis, tachycardia and tachypnea noted on admission -BP improved with IV fluid --Overall sepsis pathophysiology appears to be resolving no further tachycardia no further  tachypnea -Blood cultures NGTD -Leukocytosis persist --Continue IV Vanco and cefepime pending further culture data  2)PAFib-- - -Continue metoprolol for rate control as BP tolerates - Risk Vs benefit of Full anticoagulation discussed with patient's daughters [name] and [name] at bedside -they declined full anticoagulation at this time  3) acute metabolic encephalopathy--- superimposed on underlying dementia -As per patient's daughter this is different from patient's baseline -This is most likely due to #1 above  4)PAD--continue aspirin and Pletal  5) history of B12 and iron deficiency anemia--- c/n  B12 tablets and  iron sulfate  6)Dementia with baseline cognitive and memory deficits--- Xanax as needed  7)Lt Knee-Pain--status post periprosthetic distal femur fracture--status post operative treatment on 01/14/2021---  by Dr. Lyla Glassing, -Left knee x-rays with Considerable soft tissue swelling of the knee and distal thigh, correlate for any symptoms of infection. Positive for knee effusion. Small air-fluid level at the suprapatellar joint space---there is concern for possible septic knee/effusion--Discussed with [facility] Mr. Erick Colace (Dr Rex Kras) --recommends transfer to [facility] for orthopedic evaluation and possible knee aspiration -CRP 14.2, ESR 58, leukocytosis with WBC of 18.7--- concerns for left septic knee  8)AKI----acute kidney injury   -Creatinine is down to 2.0 from 2.5 on admission with hydration --renally adjust medications, avoid nephrotoxic agents / dehydration  / hypotension  9)Enterococcus Avium UTI--- c/n Vancomycin pending sensitivity  10)Hyponatremia/hypokalemia--hydrate and replace potassium  Disposition/Need  for in-Hospital Stay- patient unable to be discharged at this time due to sepsis  requiring IV antibiotics and IV fluids  Status is: Inpatient  Remains inpatient appropriate because:Please see above   Disposition: The patient is from: Home               Anticipated d/c is to: TBD after Ortho eval              Anticipated d/c date is: 2 days              Patient currently is not medically stable to d/c. Barriers: Not Clinically Stable-   Code Status :  -  Code Status: Full Code   Family Communication:   Discussed with daughter [name] and [name] at bedside  Consults  :  ortho  DVT Prophylaxis  :   - SCDs /lovenox -hold on 03/01/21   Lab Results  Component Value Date   PLT 290 02/28/2021    Inpatient Medications  Scheduled Meds: . aspirin EC  81 mg Oral Q breakfast  . Chlorhexidine Gluconate Cloth  6 each Topical Daily  . cilostazol  100 mg Oral BID  . [START ON 03/02/2021] enoxaparin (LOVENOX) injection  30 mg Subcutaneous Q24H  . feeding supplement  237 mL Oral TID BM  . [START ON 03/01/2021] ferrous sulfate  325 mg Oral Q breakfast  . [START ON 03/01/2021] levothyroxine  100 mcg Oral Q0600  . metoprolol tartrate  12.5 mg Oral BID  . multivitamin with minerals  1 tablet Oral Daily  . nutrition supplement (JUVEN)  1 packet Oral BID BM  . [START ON 03/01/2021] pantoprazole  40 mg Oral QAC breakfast  . potassium chloride  40 mEq Oral BID  . vitamin B-12  1,000 mcg Oral Daily   Continuous Infusions: . sodium chloride 50 mL/hr at 02/27/21 0317  . ceFEPime (MAXIPIME) IV Stopped (02/27/21 2302)  . vancomycin Stopped (02/27/21 2210)   PRN Meds:.ALPRAZolam, HYDROcodone-acetaminophen    Anti-infectives (From admission, onward)   Start     Dose/Rate Route Frequency Ordered Stop   02/27/21 2200  vancomycin (VANCOREADY) IVPB 750 mg/150 mL        750 mg 150 mL/hr over 60 Minutes Intravenous Every 48 hours 03/12/2021 2342     03/10/2021 2315  vancomycin (VANCOCIN) IVPB 1000 mg/200 mL premix        1,000 mg 200 mL/hr over 60 Minutes Intravenous  Once 03/16/2021 2313 02/26/21 0115   03/09/2021 2315  ceFEPIme (MAXIPIME) 2 g in sodium chloride 0.9 % 100 mL IVPB        2 g 200 mL/hr over 30 Minutes Intravenous Every 24 hours 03/01/2021 2313           Objective:   Vitals:   02/27/21 2148 02/28/21 0500 02/28/21 0541 02/28/21 1352  BP: (!) 95/49  (!) 116/50 118/68  Pulse: 81  77 74  Resp: 20  19 18   Temp: 98.2 F (36.8 C)  97.6 F (36.4 C) 97.6 F (36.4 C)  TempSrc: Oral   Oral  SpO2: 98%  99% 98%  Weight:  61.3 kg    Height:        Wt Readings from Last 3 Encounters:  02/28/21 61.3 kg  01/17/21 63.6 kg  04/29/14 84.4 kg     Intake/Output Summary (Last 24 hours) at 02/28/2021 1639 Last data filed at 02/28/2021 1600 Gross per 24 hour  Intake 2723.43 ml  Output 500 ml  Net 2223.43 ml  Physical Exam  Gen:- Awake Alert,  In no apparent distress  HEENT:- [place].AT, No sclera icterus Neck-Supple Neck,No JVD,.  Lungs-no wheezing, fair symmetrical air movement CV- S1, S2 normal, irregular  abd-  +ve B.Sounds, Abd Soft, No tenderness,    Extremity-- pedal pulses present  Psych-affect is flat,, baseline cognitive and memory deficits noted Neuro-generalized weakness, no new focal deficits, no tremors Lt Leg/Knee --with swelling, erythema and warmth consistent with cellulitis, please see photos in epic -Skin-sacral decubitus ulcers as well as right lateral malleolus ulcer without superimposed cellulitis   Data Review:   Micro Results Recent Results (from the past 240 hour(s))  Blood culture (routine single)     Status: None (Preliminary result)   Collection Time: 02/18/2021  8:57 PM   Specimen: Right Antecubital; Blood  Result Value Ref Range Status   Specimen Description RIGHT ANTECUBITAL  Final   Special Requests   Final    BOTTLES DRAWN AEROBIC AND ANAEROBIC Blood Culture adequate volume   Culture   Final    NO GROWTH 3 DAYS Performed at [facility], [address] [place], [place] [address]    Report Status PENDING  Incomplete  Resp Panel by RT-PCR (Flu A&B, Covid) Nasopharyngeal Swab     Status: None   Collection Time: 02/17/2021  9:00 PM   Specimen: Nasopharyngeal Swab; Nasopharyngeal(NP) swabs in vial  transport medium  Result Value Ref Range Status   SARS Coronavirus 2 by RT PCR NEGATIVE NEGATIVE Final    Comment: (NOTE) SARS-CoV-2 target nucleic acids are NOT DETECTED.  The SARS-CoV-2 RNA is generally detectable in upper respiratory specimens during the acute phase of infection. The lowest concentration of SARS-CoV-2 viral copies this assay can detect is 138 copies/mL. A negative result does not preclude SARS-Cov-2 infection and should not be used as the sole basis for treatment or other patient management decisions. A negative result may occur with  improper specimen collection/handling, submission of specimen other than nasopharyngeal swab, presence of viral mutation(s) within the areas targeted by this assay, and inadequate number of viral copies(<138 copies/mL). A negative result must be combined with clinical observations, patient history, and epidemiological information. The expected result is Negative.  Fact Sheet for Patients:  EntrepreneurPulse.com.au  Fact Sheet for Healthcare Providers:  IncredibleEmployment.be  This test is no t yet approved or cleared by the [place] FDA and  has been authorized for detection and/or diagnosis of SARS-CoV-2 by FDA under an Emergency Use Authorization (EUA). This EUA will remain  in effect (meaning this test can be used) for the duration of the COVID-19 declaration under Section 564(b)(1) of the Act, 21 U.S.C.section 360bbb-3(b)(1), unless the authorization is terminated  or revoked sooner.       Influenza A by PCR NEGATIVE NEGATIVE Final   Influenza B by PCR NEGATIVE NEGATIVE Final    Comment: (NOTE) The Xpert Xpress SARS-CoV-2/FLU/RSV plus assay is intended as an aid in the diagnosis of influenza from Nasopharyngeal swab specimens and should not be used as a sole basis for treatment. Nasal washings and aspirates are unacceptable for Xpert Xpress SARS-CoV-2/FLU/RSV testing.  Fact  Sheet for Patients: EntrepreneurPulse.com.au  Fact Sheet for Healthcare Providers: IncredibleEmployment.be  This test is not yet approved or cleared by the [place] FDA and has been authorized for detection and/or diagnosis of SARS-CoV-2 by FDA under an Emergency Use Authorization (EUA). This EUA will remain in effect (meaning this test can be used) for the duration of the COVID-19 declaration under Section 564(b)(1) of  the Act, 21 U.S.C. section 360bbb-3(b)(1), unless the authorization is terminated or revoked.  Performed at [facility], [address], [place], [place] [address]   Urine culture     Status: Abnormal (Preliminary result)   Collection Time: 02/19/2021 11:57 PM   Specimen: In/Out Cath Urine  Result Value Ref Range Status   Specimen Description   Final    IN/OUT CATH URINE Performed at [facility], [address], [place], [place] [address]    Special Requests   Final    NONE Performed at [facility], [address], [place], [place] [address]    Culture (A)  Final    80,000 COLONIES/mL ENTEROCOCCUS AVIUM SUSCEPTIBILITIES TO FOLLOW Performed at [facility], [place] [address] [place], [place] [address]    Report Status PENDING  Incomplete    Radiology Reports CT Head Wo Contrast  Result Date: 03/11/2021 CLINICAL DATA:  Altered mental status. Increased weakness. Decreased appetite. EXAM: CT HEAD WITHOUT CONTRAST TECHNIQUE: Contiguous axial images were obtained from the base of the skull through the vertex without intravenous contrast. COMPARISON:  None. FINDINGS: Brain: Moderately enlarged ventricles and subarachnoid spaces. Moderate to marked patchy white matter low density in both cerebral hemispheres. There is low density in the lateral aspect of the pons on the right and in the right cerebellar hemisphere. These are in areas of streak artifacts produced by the skull base and have an appearance suggesting artifacts on  the reconstructed sagittal and coronal images. Otherwise, no intracranial hemorrhage, mass lesion or CT evidence of acute infarction. Vascular: No hyperdense vessel or unexpected calcification. Skull: Normal. Negative for fracture or focal lesion. Sinuses/Orbits: Status post bilateral cataract extraction. Opacified left posterior ethmoid sinus. Other: None. IMPRESSION: 1. No acute abnormality. 2. Moderate diffuse cerebral and cerebellar atrophy. 3. Moderate to marked chronic small vessel white matter ischemic changes in both cerebral hemispheres. 4. Probable artifacts in the lateral aspect of the pons on the right and in the right cerebellar hemisphere. Areas of acute infarction are significantly less likely. 5. Chronic left posterior ethmoid sinusitis. Electronically Signed   By: Claudie Revering M.D.   On: 03/02/2021 23:15   US Venous Img Lower Bilateral (DVT)  Result Date: 02/27/2021 CLINICAL DATA:  87 year old female with leg swelling. EXAM: BILATERAL LOWER EXTREMITY VENOUS DOPPLER ULTRASOUND TECHNIQUE: Gray-scale sonography with graded compression, as well as color Doppler and duplex ultrasound were performed to evaluate the lower extremity deep venous systems from the level of the common femoral vein and including the common femoral, femoral, profunda femoral, popliteal and calf veins including the posterior tibial, peroneal and gastrocnemius veins when visible. The superficial great saphenous vein was also interrogated. Spectral Doppler was utilized to evaluate flow at rest and with distal augmentation maneuvers in the common femoral, femoral and popliteal veins. COMPARISON:  None. FINDINGS: RIGHT LOWER EXTREMITY Common Femoral Vein: No evidence of thrombus. Normal compressibility, respiratory phasicity and response to augmentation. Saphenofemoral Junction: No evidence of thrombus. Normal compressibility and flow on color Doppler imaging. Profunda Femoral Vein: No evidence of thrombus. Normal compressibility  and flow on color Doppler imaging. Femoral Vein: No evidence of thrombus. Normal compressibility, respiratory phasicity and response to augmentation. Popliteal Vein: No evidence of thrombus. Normal compressibility, respiratory phasicity and response to augmentation. Calf Veins: No evidence of thrombus. Normal compressibility and flow on color Doppler imaging. Other Findings:  None. LEFT LOWER EXTREMITY Common Femoral Vein: No evidence of thrombus. Normal compressibility, respiratory phasicity and response to augmentation. Saphenofemoral Junction: No  evidence of thrombus. Normal compressibility and flow on color Doppler imaging. Profunda Femoral Vein: No evidence of thrombus. Normal compressibility and flow on color Doppler imaging. Femoral Vein: No evidence of thrombus. Normal compressibility, respiratory phasicity and response to augmentation. Popliteal Vein: No evidence of thrombus. Normal compressibility, respiratory phasicity and response to augmentation. Calf Veins: No evidence of thrombus. Normal compressibility and flow on color Doppler imaging. Other Findings:  None. IMPRESSION: No evidence of bilateral lower extremity deep venous thrombosis. Ruthann Cancer, MD [facility] Radiology Electronically Signed   By: Ruthann Cancer MD   On: 02/27/2021 11:58   DG Chest Port 1 View  Result Date: 03/05/2021 CLINICAL DATA:  Weakness and decreased appetite. Recent knee surgery. EXAM: PORTABLE CHEST 1 VIEW COMPARISON:  01/10/2021 FINDINGS: Stable enlarged cardiac silhouette and tortuous thoracic aorta. Clear lungs with normal vascularity. Left shoulder prosthesis. Moderate thoracolumbar scoliosis. IMPRESSION: No acute abnormality. Stable cardiomegaly. Electronically Signed   By: Claudie Revering M.D.   On: 03/02/2021 21:27   DG Knee Complete 4 Views Left  Result Date: 02/26/2021 CLINICAL DATA:  History of surgery EXAM: LEFT KNEE - COMPLETE 4+ VIEW COMPARISON:  01/14/2021  FINDINGS: Status post left knee replacement with resection of distal femur and long-stem prosthesis in the femur and tibia. No acute fracture is visualized. Considerable soft tissue swelling at the knee and distal thigh. Suspected small gas at the suprapatellar joint space. Vascular calcifications. IMPRESSION: Status post left knee replacement with intact hardware and normal alignment. Considerable soft tissue swelling of the knee and distal thigh, correlate for any symptoms of infection. Positive for knee effusion. Small air-fluid level at the suprapatellar joint space, correlate for any history of recent knee aspiration. Electronically Signed   By: Donavan Foil M.D.   On: 02/26/2021 20:22   ECHOCARDIOGRAM COMPLETE  Result Date: 02/26/2021    ECHOCARDIOGRAM REPORT   Patient Name:   TENESHIA HEDEEN Date of Exam: 02/26/2021 Medical Rec #:  [id number]      Height:       64.0 in Accession #:    [id number]     Weight:       134.9 lb Date of Birth:  1929-08-24     BSA:          1.655 m Patient Age:    61 years       BP:           101/64 mmHg Patient Gender: F              HR:           98 bpm. Exam Location:  [facility] Procedure: 2D Echo, Cardiac Doppler and Color Doppler Indications:    CHF-Acute Diastolic R51.88  History:        Patient has no prior history of Echocardiogram examinations.                 COPD; Risk Factors:Hypertension and Dyslipidemia. Elevated BNP,                 Sepsis.  Sonographer:    Alvino Chapel RCS Referring Phys: [id number] OLADAPO ADEFESO IMPRESSIONS  1. Small LV cavity with very hyperdynamic function consider volume or beta blockade if clinically indicated . Left ventricular ejection fraction, by estimation, is 60 to 65%. The left ventricle has normal function. The left ventricle has no regional wall motion abnormalities. Left ventricular diastolic parameters are consistent with Grade I diastolic dysfunction (impaired relaxation).  2. Right  ventricular systolic function is normal. The  right ventricular size is normal.  3. The mitral valve is degenerative. No evidence of mitral valve regurgitation. No evidence of mitral stenosis. Moderate mitral annular calcification.  4. The aortic valve was not well visualized. There is moderate calcification of the aortic valve. Aortic valve regurgitation is not visualized. Mild to moderate aortic valve sclerosis/calcification is present, without any evidence of aortic stenosis.  5. The inferior vena cava is dilated in size with >50% respiratory variability, suggesting right atrial pressure of 8 mmHg. FINDINGS  Left Ventricle: Small LV cavity with very hyperdynamic function consider volume or beta blockade if clinically indicated. Left ventricular ejection fraction, by estimation, is 60 to 65%. The left ventricle has normal function. The left ventricle has no regional wall motion abnormalities. The left ventricular internal cavity size was normal in size. There is no left ventricular hypertrophy. Left ventricular diastolic parameters are consistent with Grade I diastolic dysfunction (impaired relaxation). Right Ventricle: The right ventricular size is normal. No increase in right ventricular wall thickness. Right ventricular systolic function is normal. Left Atrium: Left atrial size was normal in size. Right Atrium: Right atrial size was normal in size. Pericardium: There is no evidence of pericardial effusion. Mitral Valve: The mitral valve is degenerative in appearance. There is mild thickening of the mitral valve leaflet(s). There is mild calcification of the mitral valve leaflet(s). Moderate mitral annular calcification. No evidence of mitral valve regurgitation. No evidence of mitral valve stenosis. Tricuspid Valve: The tricuspid valve is normal in structure. Tricuspid valve regurgitation is not demonstrated. No evidence of tricuspid stenosis. Aortic Valve: The aortic valve was not well visualized. There is moderate calcification of the aortic valve.  Aortic valve regurgitation is not visualized. Mild to moderate aortic valve sclerosis/calcification is present, without any evidence of aortic stenosis. Pulmonic Valve: The pulmonic valve was normal in structure. Pulmonic valve regurgitation is not visualized. No evidence of pulmonic stenosis. Aorta: The aortic root is normal in size and structure. Venous: The inferior vena cava is dilated in size with greater than 50% respiratory variability, suggesting right atrial pressure of 8 mmHg. IAS/Shunts: The interatrial septum was not well visualized.  LEFT VENTRICLE PLAX 2D LVIDd:         4.00 cm  Diastology LVIDs:         3.00 cm  LV e' medial:    5.11 cm/s LV PW:         1.10 cm  LV E/e' medial:  16.9 LV IVS:        1.10 cm  LV e' lateral:   6.20 cm/s LVOT diam:     1.40 cm  LV E/e' lateral: 13.9 LV SV:         48 LV SV Index:   29 LVOT Area:     1.54 cm  RIGHT VENTRICLE RV S prime:     22.80 cm/s TAPSE (M-mode): 2.1 cm LEFT ATRIUM             Index       RIGHT ATRIUM           Index LA diam:        2.70 cm 1.63 cm/m  RA Area:     17.20 cm LA Vol (A2C):   35.9 ml 21.69 ml/m RA Volume:   42.00 ml  25.38 ml/m LA Vol (A4C):   29.9 ml 18.07 ml/m LA Biplane Vol: 34.6 ml 20.91 ml/m  AORTIC VALVE LVOT Vmax:   171.00  cm/s LVOT Vmean:  126.333 cm/s LVOT VTI:    0.311 m  AORTA Ao Root diam: 3.40 cm MITRAL VALVE MV Area (PHT): 4.33 cm     SHUNTS MV Decel Time: 175 msec     Systemic VTI:  0.31 m MV E velocity: 86.40 cm/s   Systemic Diam: 1.40 cm MV A velocity: 144.00 cm/s MV E/A ratio:  0.60 Jenkins Rouge MD Electronically signed by Jenkins Rouge MD Signature Date/Time: 02/26/2021/4:34:15 PM    Final      CBC Recent Labs  Lab 02/22/2021 2005 02/26/21 0534 02/28/21 0511  WBC 17.2* 15.1* 18.7*  HGB 10.2* 9.5* 8.5*  HCT 31.9* 30.3* 26.6*  PLT 375 327 290  MCV 94.7 95.9 95.0  MCH 30.3 30.1 30.4  MCHC 32.0 31.4 32.0  RDW 15.0 15.1 15.3  LYMPHSABS 0.9  --   --   MONOABS 0.7  --   --   EOSABS 0.1  --   --    BASOSABS 0.0  --   --     Chemistries  Recent Labs  Lab 02/17/2021 2005 02/26/21 0534 02/28/21 0511  NA 129* 134* 133*  K 4.5 3.8 3.4*  CL 92* 100 102  CO2 25 22 22   GLUCOSE 89 78 86  BUN 72* 70* 68*  CREATININE 2.53* 2.17* 2.04*  CALCIUM 8.6* 8.2* 8.0*  MG  --  2.0 1.9  AST 14* 13*  --   ALT 9 8  --   ALKPHOS 80 69  --   BILITOT 0.7 0.7  --    ------------------------------------------------------------------------------------------------------------------ No results for input(s): CHOL, HDL, LDLCALC, TRIG, CHOLHDL, LDLDIRECT in the last 72 hours.  No results found for: HGBA1C ------------------------------------------------------------------------------------------------------------------ Recent Labs    02/21/2021 2005  TSH 4.023   ------------------------------------------------------------------------------------------------------------------ No results for input(s): VITAMINB12, FOLATE, FERRITIN, TIBC, IRON, RETICCTPCT in the last 72 hours.  Coagulation profile Recent Labs  Lab 03/02/2021 2005 02/26/21 0534  INR 1.2 1.3*    No results for input(s): DDIMER in the last 72 hours.  Cardiac Enzymes No results for input(s): CKMB, TROPONINI, MYOGLOBIN in the last 168 hours.  Invalid input(s): CK ------------------------------------------------------------------------------------------------------------------    Component Value Date/Time   BNP 400.0 (H) 03/15/2021 2005   Roxan Hockey M.D on 02/28/2021 at 4:39 PM  Go to [email or web address] - for contact info  [facility] - Office  [phone]

## 2021-02-28 NOTE — Progress Notes (Signed)
Report called to [place] on 2W at [facility], pt will be seen by Dr Lyla Glassing.  Report to EMS as well.  Pt forms prepared and to be sent with EMS.

## 2021-02-28 NOTE — Progress Notes (Signed)
Pharmacy Antibiotic Note  KIOSHA BUCHAN is a 87 y.o. female admitted on 03/06/2021 with sepsis.  Pharmacy has been consulted for Vancomycin/Cefepime dosing.  AFebrile, patient with left leg cellulitis.  x-rays of left knee with effusion but do not plan to tap knee. Overall, patient is improving. SCR slightly improved. Vancomycin tr level on Friday if continue.  Plan: Continue Vancomycin 750 mg IV q48h >>Estimated AUC: 535 Continue Cefepime 2g IV q24h Trend WBC, temp, renal function  Drug levels as indicated   Height: 5' 4.02" (162.6 cm) Weight: 61.3 kg (135 lb 2.3 oz) IBW/kg (Calculated) : 54.74  Temp (24hrs), Avg:98 F (36.7 C), Min:97.6 F (36.4 C), Max:98.2 F (36.8 C)  Recent Labs  Lab 03/17/2021 2005 03/14/2021 2302 02/26/21 0534 02/28/21 0511  WBC 17.2*  --  15.1* 18.7*  CREATININE 2.53*  --  2.17* 2.04*  LATICACIDVEN 1.4 0.9  --   --     Estimated Creatinine Clearance: 16.1 mL/min (A) (by C-G formula based on SCr of 2.04 mg/dL (H)).    Allergies  Allergen Reactions  . Amoxicillin Other (See Comments)    Burning sensation.  Marland Kitchen Penicillins Other (See Comments)    Burning sensation.   Antibiotics: Cefepime 4/11>> Vanco 4/11 >>  Cultures: 4/10 BCX: ngtd 4/10 UCX: E. Avum 80K CFU/ml   Isac Sarna, BS Vena [place], BCPS Clinical Pharmacist Pager [phone]

## 2021-02-28 NOTE — Plan of Care (Signed)

## 2021-02-28 NOTE — Progress Notes (Signed)
Alert and oriented to self. Disoriented to time, and situation. Dependent on staff for all adls. Poor po intake. IV fluids infusing and tolerated well. Wound dressings changed per order. Foley care done by NT. Turned and repositioned in bed. On specialty air loss mattress. Continue plan of care.

## 2021-02-28 NOTE — TOC Progression Note (Signed)
Transition of Care [facility]) - Progression Note    Patient Details  Name: [name] MRN: [id number] Date of Birth: May 02, 1929  Transition of Care [facility]) CM/SW Contact  Natasha Bence, LCSW Phone Number: 02/28/2021, 3:35 PM  Clinical Narrative:    CSW notified of patient's need for [facility]. CSW contacted patient's daughter to inquire about agreeableness to [facility]. Patient agreeable. CSW referred patient to [facility]. TOC to follow.         Expected Discharge Plan and Services                                                 Social Determinants of Health (SDOH) Interventions    Readmission Risk Interventions No flowsheet data found.

## 2021-03-01 DIAGNOSIS — A419 Sepsis, unspecified organism: Secondary | ICD-10-CM | POA: Diagnosis not present

## 2021-03-01 LAB — URINE CULTURE: Culture: 80000 — AB

## 2021-03-01 LAB — RENAL FUNCTION PANEL
Albumin: 1.6 g/dL — ABNORMAL LOW (ref 3.5–5.0)
Anion gap: 4 — ABNORMAL LOW (ref 5–15)
BUN: 68 mg/dL — ABNORMAL HIGH (ref 8–23)
CO2: 23 mmol/L (ref 22–32)
Calcium: 8.1 mg/dL — ABNORMAL LOW (ref 8.9–10.3)
Chloride: 106 mmol/L (ref 98–111)
Creatinine, Ser: 2.07 mg/dL — ABNORMAL HIGH (ref 0.44–1.00)
GFR, Estimated: 22 mL/min — ABNORMAL LOW (ref >60)
Glucose, Bld: 104 mg/dL — ABNORMAL HIGH (ref 70–99)
Phosphorus: 2.9 mg/dL (ref 2.5–4.6)
Potassium: 4.6 mmol/L (ref 3.5–5.1)
Sodium: 133 mmol/L — ABNORMAL LOW (ref 135–145)

## 2021-03-01 LAB — CBC
HCT: 26.9 % — ABNORMAL LOW (ref 36.0–46.0)
Hemoglobin: 8.7 g/dL — ABNORMAL LOW (ref 12.0–15.0)
MCH: 30.2 pg (ref 26.0–34.0)
MCHC: 32.3 g/dL (ref 30.0–36.0)
MCV: 93.4 fL (ref 80.0–100.0)
Platelets: 317 10*3/uL (ref 150–400)
RBC: 2.88 MIL/uL — ABNORMAL LOW (ref 3.87–5.11)
RDW: 15.3 % (ref 11.5–15.5)
WBC: 16.6 10*3/uL — ABNORMAL HIGH (ref 4.0–10.5)
nRBC: 0 % (ref 0.0–0.2)

## 2021-03-01 LAB — SYNOVIAL CELL COUNT + DIFF, W/ CRYSTALS
Crystals, Fluid: NONE SEEN
Other Cells-SYN: UNDETERMINED
WBC, Synovial: 100000 /mm3 — ABNORMAL HIGH (ref 0–200)

## 2021-03-01 MED ORDER — ENSURE PRE-SURGERY PO LIQD
296.0000 mL | Freq: Once | ORAL | Status: AC
  Administered 2021-03-01: 296 mL via ORAL

## 2021-03-01 MED ORDER — ENSURE PRE-SURGERY PO LIQD
296.0000 mL | Freq: Once | ORAL | Status: DC
  Filled 2021-03-01: qty 296

## 2021-03-01 MED ORDER — BUPIVACAINE HCL (PF) 0.5 % IJ SOLN
10.0000 mL | Freq: Once | INTRAMUSCULAR | Status: DC
  Filled 2021-03-01 (×2): qty 10

## 2021-03-01 NOTE — Progress Notes (Signed)
Left knee arthrocenteses bedside procedure completed by Ortho Md. Procedure tolerated well. Specimen sent to lab per MD order. Vital signs stable. Primary RN  Made aware to monitor procedure site.

## 2021-03-01 NOTE — Progress Notes (Signed)
SLP Cancellation Note  Patient Details Name: [name] MRN: [id number] DOB: 1929/03/31   Cancelled treatment:       Reason Eval/Treat Not Completed: Medical issues which prohibited therapy (Pt is currently NPO for procedure. SLP will f/u on subsequent date.)  Jania Steinke I. Hardin Negus, [place], [place] Office number [phone] Pager [place] 03/01/2021, 10:42 AM

## 2021-03-01 NOTE — Progress Notes (Signed)
PROGRESS NOTE    [name]  YTK:[id number] DOB: 04-19-1929 DOA: 03/02/2021 PCP: Rosita Fire, MD   Chief Complaint  Patient presents with  . Weakness  Brief Narrative: 87 year old female with hypothyroidism, PAF, B12 deficiency, dementia, hypertension PVD arthritis was admitted 4/10 at [facility] due to increaseing confusion and generalized weakness.  She was found to have severe sepsis due to lower extremity cellulitis and there was concern for left septic knee arthritis-orthopedic was consulted so transferred to [facility] for evaluation.  Her urine culture has been growing Enterococcus Avium. Patient is being treated for severe sepsis, acute renal failure, Enterococcus UTI, hyponatremia, hypokalemia, acute metabolic encephalopathy in the setting of dementia with baseline cognitive and memory deficit. Being managed with vancomycin, cefepime  Subjective: Seen this morning CNs alert awake difficult to comprehend her speech. Able to follow some commands instruction told me her birth year " I cannot walk" Appears confused but not in distress not agitated She is on room air  Assessment & Plan:  Severe Sepsis due to left leg cellulitis, possible left septic knee: On vancomycin and cefepime, pharmacy dosing.  Still with leukocytosis.  Patient had leukocytosis tachycardia and tachypnea on admission. Blood culture no growth so far.  Blood pressure soft.  Follow-up orthopedic recommendation continue antibiotics Recent Labs  Lab 03/10/2021 2005 03/15/2021 2302 02/26/21 0534 02/28/21 0511 03/01/21 0211  WBC 17.2*  --  15.1* 18.7* 16.6*  LATICACIDVEN 1.4 0.9  --   --   --    Possible left septic knee: With leukocytosis and elevated CRP 14.2 ESR 58.  Left knee with considerable soft tissue swelling, status postoperative treatment of distal femur fracture on 01/14/2019 by Dr. Lyla Glassing. Dr Denton Brick discussed with [facility] Mr. Erick Colace (Dr Rex Kras) and was transferred  to [facility] for orthopedic evaluation and possible knee aspiration.  Informed Dr. Lyla Glassing and he is planning for aspiration today.   Acute kidney injury: Baseline creatinine level was 0.7-1.0.  Creat remains elevated,  continue to hydrate, avoid nephrotoxic medication hypotension Recent Labs  Lab 02/24/2021 2005 02/26/21 0534 02/28/21 0511 03/01/21 0211  BUN 72* 70* 68* 68*  CREATININE 2.53* 2.17* 2.04* 5.57*   Acute metabolic encephalopathy in the setting of underlying dementia with cognitive and memory.  Continue delirium precaution fall precaution supportive care OT orientation.  Deficit  Enterococcus UTI already on vancomycin.  Follow sensitivity  Hyponatremia/hypokalemia: Electrolytes are stable.  History of B12 and iron deficiency anemia: Continue supplement: Hemoglobin downtrending 10> 8.7 gm. Cont to monitor Recent Labs  Lab 03/08/2021 2005 02/26/21 0534 02/28/21 0511 03/01/21 0211  HGB 10.2* 9.5* 8.5* 8.7*  HCT 31.9* 30.3* 26.6* 26.9*    Essential hypertension: BP soft.  Monitor PVD: On aspirin, Pletal GERD: Continue PPI Hypothyroidism: Continue Synthroid Paroxysmal A-fib hx: Not on anticoagulation.  Continue metoprolol with holding parameters  Diet Order            Diet NPO time specified Except for: Sips with Meds  Diet effective midnight                 Nutrition Problem: Increased nutrient needs Etiology: wound healing (3 stage 4 PI and a DTI to left heel) Signs/Symptoms: estimated needs Interventions: Ensure Enlive (each supplement provides 350kcal and 20 grams of protein),MVI,Juven,Prostat Patient's Body mass index is 23.19 kg/m.  Pressure Injury 03/15/2021 Sacrum Mid Stage 4 - Full thickness tissue loss with exposed bone, tendon or muscle. tunneling (Active)  02/26/2021 2105  Location: Sacrum  Location Orientation: Mid  Staging: Stage 4 - Full thickness tissue loss with exposed bone, tendon or muscle.  Wound Description (Comments): tunneling  Present  on Admission: Yes     Pressure Injury 03/17/2021 Sacrum Mid Stage 4 - Full thickness tissue loss with exposed bone, tendon or muscle. tunneling (Active)  03/01/2021 2106  Location: Sacrum  Location Orientation: Mid  Staging: Stage 4 - Full thickness tissue loss with exposed bone, tendon or muscle.  Wound Description (Comments): tunneling  Present on Admission: Yes     Pressure Injury 02/26/21 Ankle Right;Lateral Stage 4 - Full thickness tissue loss with exposed bone, tendon or muscle. (Active)  02/26/21 0202  Location: Ankle  Location Orientation: Right;Lateral  Staging: Stage 4 - Full thickness tissue loss with exposed bone, tendon or muscle.  Wound Description (Comments):   Present on Admission:      Pressure Injury 02/26/21 Foot Left;Lateral Deep Tissue Pressure Injury - Purple or maroon localized area of discolored intact skin or blood-filled blister due to damage of underlying soft tissue from pressure and/or shear. (Active)  02/26/21 0158  Location: Foot  Location Orientation: Left;Lateral  Staging: Deep Tissue Pressure Injury - Purple or maroon localized area of discolored intact skin or blood-filled blister due to damage of underlying soft tissue from pressure and/or shear.  Wound Description (Comments):   Present on Admission: Yes    DVT prophylaxis: enoxaparin (LOVENOX) injection 30 mg Start: 03/02/21 1000 SCDs Start: 02/26/21 0147 Code Status:   Code Status: Full Code  Family Communication: plan of care discussed with patient at bedside.  Status is: Inpatient Remains inpatient appropriate because:IV treatments appropriate due to intensity of illness or inability to take PO and Inpatient level of care appropriate due to severity of illness  Dispo: The patient is from: Home              Anticipated d/c is to: TBD              Patient currently is not medically stable to d/c.   Difficult to place patient No Unresulted Labs (From admission, onward)         None     Medications reviewed:  Scheduled Meds: . aspirin EC  81 mg Oral Q breakfast  . Chlorhexidine Gluconate Cloth  6 each Topical Daily  . cilostazol  100 mg Oral BID  . [START ON 03/02/2021] enoxaparin (LOVENOX) injection  30 mg Subcutaneous Q24H  . feeding supplement  237 mL Oral TID BM  . ferrous sulfate  325 mg Oral Q breakfast  . levothyroxine  100 mcg Oral Q0600  . metoprolol tartrate  12.5 mg Oral BID  . multivitamin with minerals  1 tablet Oral Daily  . nutrition supplement (JUVEN)  1 packet Oral BID BM  . pantoprazole  40 mg Oral QAC breakfast  . vitamin B-12  1,000 mcg Oral Daily   Continuous Infusions: . sodium chloride 30 mL/hr at 02/28/21 2300  . ceFEPime (MAXIPIME) IV 2 g (02/28/21 2226)  . vancomycin Stopped (02/27/21 2210)    Consultants:see note  Procedures:see note  Antimicrobials: Anti-infectives (From admission, onward)   Start     Dose/Rate Route Frequency Ordered Stop   02/27/21 2200  vancomycin (VANCOREADY) IVPB 750 mg/150 mL        750 mg 150 mL/hr over 60 Minutes Intravenous Every 48 hours 02/16/2021 2342     03/07/2021 2315  vancomycin (VANCOCIN) IVPB 1000 mg/200 mL premix        1,000 mg  200 mL/hr over 60 Minutes Intravenous  Once 02/28/2021 2313 02/26/21 0115   03/09/2021 2315  ceFEPIme (MAXIPIME) 2 g in sodium chloride 0.9 % 100 mL IVPB        2 g 200 mL/hr over 30 Minutes Intravenous Every 24 hours 02/16/2021 2313       Culture/Microbiology    Component Value Date/Time   SDES  03/07/2021 2357    IN/OUT CATH URINE Performed at [facility], [address], [place], [place] [address]    [facility]  03/08/2021 2357    NONE Performed at [facility], [address] [place], [place] [address]    CULT (A) 03/14/2021 2357    80,000 COLONIES/mL ENTEROCOCCUS AVIUM SUSCEPTIBILITIES TO FOLLOW Performed at [facility] [address], [place], [place] [address]    REPTSTATUS PENDING 03/02/2021 2357    Other culture-see  note  Objective: Vitals: Today's Vitals   02/28/21 2012 02/28/21 2017 02/28/21 2100 03/01/21 0617  BP: (!) 93/47 (!) 96/49 126/60 (!) 108/57  Pulse: 81 (!) 104 88 84  Resp: 20   16  Temp: 98.6 F (37 C)  97.8 F (36.6 C) 97.7 F (36.5 C)  TempSrc: Oral  Oral Oral  SpO2: 100% 100% 98% 98%  Weight:      Height:      PainSc:   7      Intake/Output Summary (Last 24 hours) at 03/01/2021 0738 Last data filed at 03/01/2021 0600 Gross per 24 hour  Intake 1120 ml  Output 650 ml  Net 470 ml   Filed Weights   02/22/2021 1928 02/26/21 0148 02/28/21 0500  Weight: 63.6 kg 61.2 kg 61.3 kg   Weight change:   Intake/Output from previous day: 04/13 0701 - 04/14 0700 In: 1120 [P.O.:120; I.V.:1000] Out: 650 [Urine:650] Intake/Output this shift: No intake/output data recorded. Filed Weights   03/09/2021 1928 02/26/21 0148 02/28/21 0500  Weight: 63.6 kg 61.2 kg 61.3 kg    Examination: General exam: Alert, awake oriented x1, on room air, not agitated, chronically ill and sick looking  HEENT:Oral mucosa moist, Ear/Nose WNL grossly,dentition normal. Respiratory system: bilaterally diminished,no use of accessory muscle, non tender. Cardiovascular system: S1 & S2 +, regular, No JVD. Gastrointestinal system: Abdomen soft, NT,ND, BS+. Nervous System:Alert, awake, moving extremities and grossly nonfocal Extremities: Status post left knee surgery SCAR- healed, knee is tender, slightly swollen  Skin: No rashes,no icterus. MSK: Normal muscle bulk,tone, power  Data Reviewed: I have personally reviewed following labs and imaging studies CBC: Recent Labs  Lab 03/06/2021 2005 02/26/21 0534 02/28/21 0511 03/01/21 0211  WBC 17.2* 15.1* 18.7* 16.6*  NEUTROABS 15.1*  --   --   --   HGB 10.2* 9.5* 8.5* 8.7*  HCT 31.9* 30.3* 26.6* 26.9*  MCV 94.7 95.9 95.0 93.4  PLT 375 327 290 779   Basic Metabolic Panel: Recent Labs  Lab 02/19/2021 2005 02/26/21 0534 02/28/21 0511 03/01/21 0211  NA 129* 134*  133* 133*  K 4.5 3.8 3.4* 4.6  CL 92* 100 102 106  CO2 25 22 22 23   GLUCOSE 89 78 86 104*  BUN 72* 70* 68* 68*  CREATININE 2.53* 2.17* 2.04* 2.07*  CALCIUM 8.6* 8.2* 8.0* 8.1*  MG  --  2.0 1.9  --   PHOS  --  4.3  --  2.9   GFR: Estimated Creatinine Clearance: 15.9 mL/min (A) (by C-G formula based on SCr of 2.07 mg/dL (H)). Liver Function Tests: Recent Labs  Lab 02/18/2021 2005 02/26/21  1610 03/01/21 0211  AST 14* 13*  --   ALT 9 8  --   ALKPHOS 80 69  --   BILITOT 0.7 0.7  --   PROT 7.1 6.4*  --   ALBUMIN 2.3* 2.0* 1.6*   No results for input(s): LIPASE, AMYLASE in the last 168 hours. No results for input(s): AMMONIA in the last 168 hours. Coagulation Profile: Recent Labs  Lab 02/22/2021 2005 02/26/21 0534  INR 1.2 1.3*   Cardiac Enzymes: No results for input(s): CKTOTAL, CKMB, CKMBINDEX, TROPONINI in the last 168 hours. BNP (last 3 results) No results for input(s): PROBNP in the last 8760 hours. HbA1C: No results for input(s): HGBA1C in the last 72 hours. CBG: Recent Labs  Lab 02/24/2021 1939  GLUCAP 84   Lipid Profile: No results for input(s): CHOL, HDL, LDLCALC, TRIG, CHOLHDL, LDLDIRECT in the last 72 hours. Thyroid Function Tests: No results for input(s): TSH, T4TOTAL, FREET4, T3FREE, THYROIDAB in the last 72 hours. Anemia Panel: No results for input(s): VITAMINB12, FOLATE, FERRITIN, TIBC, IRON, RETICCTPCT in the last 72 hours. Sepsis Labs: Recent Labs  Lab 03/11/2021 2005 03/02/2021 2302  LATICACIDVEN 1.4 0.9    Recent Results (from the past 240 hour(s))  Blood culture (routine single)     Status: None (Preliminary result)   Collection Time: 02/17/2021  8:57 PM   Specimen: Right Antecubital; Blood  Result Value Ref Range Status   Specimen Description RIGHT ANTECUBITAL  Final   Special Requests   Final    BOTTLES DRAWN AEROBIC AND ANAEROBIC Blood Culture adequate volume   Culture   Final    NO GROWTH 3 DAYS Performed at [facility], [address] [place], [place] [address]    Report Status PENDING  Incomplete  Resp Panel by RT-PCR (Flu A&B, Covid) Nasopharyngeal Swab     Status: None   Collection Time: 02/23/2021  9:00 PM   Specimen: Nasopharyngeal Swab; Nasopharyngeal(NP) swabs in vial transport medium  Result Value Ref Range Status   SARS Coronavirus 2 by RT PCR NEGATIVE NEGATIVE Final    Comment: (NOTE) SARS-CoV-2 target nucleic acids are NOT DETECTED.  The SARS-CoV-2 RNA is generally detectable in upper respiratory specimens during the acute phase of infection. The lowest concentration of SARS-CoV-2 viral copies this assay can detect is 138 copies/mL. A negative result does not preclude SARS-Cov-2 infection and should not be used as the sole basis for treatment or other patient management decisions. A negative result may occur with  improper specimen collection/handling, submission of specimen other than nasopharyngeal swab, presence of viral mutation(s) within the areas targeted by this assay, and inadequate number of viral copies(<138 copies/mL). A negative result must be combined with clinical observations, patient history, and epidemiological information. The expected result is Negative.  Fact Sheet for Patients:  EntrepreneurPulse.com.au  Fact Sheet for Healthcare Providers:  IncredibleEmployment.be  This test is no t yet approved or cleared by the [place] FDA and  has been authorized for detection and/or diagnosis of SARS-CoV-2 by FDA under an Emergency Use Authorization (EUA). This EUA will remain  in effect (meaning this test can be used) for the duration of the COVID-19 declaration under Section 564(b)(1) of the Act, 21 U.S.C.section 360bbb-3(b)(1), unless the authorization is terminated  or revoked sooner.       Influenza A by PCR NEGATIVE NEGATIVE Final   Influenza B by PCR NEGATIVE NEGATIVE Final    Comment: (NOTE) The Xpert Xpress SARS-CoV-2/FLU/RSV plus assay  is intended as an  aid in the diagnosis of influenza from Nasopharyngeal swab specimens and should not be used as a sole basis for treatment. Nasal washings and aspirates are unacceptable for Xpert Xpress SARS-CoV-2/FLU/RSV testing.  Fact Sheet for Patients: EntrepreneurPulse.com.au  Fact Sheet for Healthcare Providers: IncredibleEmployment.be  This test is not yet approved or cleared by the [place] FDA and has been authorized for detection and/or diagnosis of SARS-CoV-2 by FDA under an Emergency Use Authorization (EUA). This EUA will remain in effect (meaning this test can be used) for the duration of the COVID-19 declaration under Section 564(b)(1) of the Act, 21 U.S.C. section 360bbb-3(b)(1), unless the authorization is terminated or revoked.  Performed at [facility], [address] [place], [place] [address]   Urine culture     Status: Abnormal (Preliminary result)   Collection Time: 03/13/2021 11:57 PM   Specimen: In/Out Cath Urine  Result Value Ref Range Status   Specimen Description   Final    IN/OUT CATH URINE Performed at [facility], [address] [place], [place] [address]    Special Requests   Final    NONE Performed at [facility], [address], [place], [place] [address]    Culture (A)  Final    80,000 COLONIES/mL ENTEROCOCCUS AVIUM SUSCEPTIBILITIES TO FOLLOW Performed at [facility], [place] [address], [place], [place] [address]    Report Status PENDING  Incomplete     Radiology Studies: US Venous Img Lower Bilateral (DVT)  Result Date: 02/27/2021 CLINICAL DATA:  87 year old female with leg swelling. EXAM: BILATERAL LOWER EXTREMITY VENOUS DOPPLER ULTRASOUND TECHNIQUE: Gray-scale sonography with graded compression, as well as color Doppler and duplex ultrasound were performed to evaluate the lower extremity deep venous systems from the level of the common femoral vein and including the common femoral, femoral,  profunda femoral, popliteal and calf veins including the posterior tibial, peroneal and gastrocnemius veins when visible. The superficial great saphenous vein was also interrogated. Spectral Doppler was utilized to evaluate flow at rest and with distal augmentation maneuvers in the common femoral, femoral and popliteal veins. COMPARISON:  None. FINDINGS: RIGHT LOWER EXTREMITY Common Femoral Vein: No evidence of thrombus. Normal compressibility, respiratory phasicity and response to augmentation. Saphenofemoral Junction: No evidence of thrombus. Normal compressibility and flow on color Doppler imaging. Profunda Femoral Vein: No evidence of thrombus. Normal compressibility and flow on color Doppler imaging. Femoral Vein: No evidence of thrombus. Normal compressibility, respiratory phasicity and response to augmentation. Popliteal Vein: No evidence of thrombus. Normal compressibility, respiratory phasicity and response to augmentation. Calf Veins: No evidence of thrombus. Normal compressibility and flow on color Doppler imaging. Other Findings:  None. LEFT LOWER EXTREMITY Common Femoral Vein: No evidence of thrombus. Normal compressibility, respiratory phasicity and response to augmentation. Saphenofemoral Junction: No evidence of thrombus. Normal compressibility and flow on color Doppler imaging. Profunda Femoral Vein: No evidence of thrombus. Normal compressibility and flow on color Doppler imaging. Femoral Vein: No evidence of thrombus. Normal compressibility, respiratory phasicity and response to augmentation. Popliteal Vein: No evidence of thrombus. Normal compressibility, respiratory phasicity and response to augmentation. Calf Veins: No evidence of thrombus. Normal compressibility and flow on color Doppler imaging. Other Findings:  None. IMPRESSION: No evidence of bilateral lower extremity deep venous thrombosis. Ruthann Cancer, MD [facility]  Electronically Signed   By: Ruthann Cancer MD   On: 02/27/2021 11:58     LOS: 4 days   Antonieta Pert, MD [facility]  03/01/2021, 7:38 AM

## 2021-03-01 NOTE — Plan of Care (Signed)

## 2021-03-01 NOTE — Procedures (Signed)
Procedure: Left knee aspiration and injection  Indication: Left knee effusion(s)  Surgeon: Silvestre Gunner, PA-C  Assist: None  Anesthesia: Topical refrigerant  EBL: None  Complications: None  Findings: After risks/benefits explained patient desires to undergo procedure. Consent obtained and time out performed. The left knee was sterilely prepped and aspirated. 106ml reddish cloudy fluid obtained. Pt tolerated the procedure well.    Lisette Abu, PA-C Orthopedic Surgery [phone]

## 2021-03-01 NOTE — Care Management Important Message (Signed)
Important Message  Patient Details  Name: [name] MRN: [id number] Date of Birth: 12-10-1928   Medicare Important Message Given:  Yes     Szymon Foiles Montine Circle 03/01/2021, 2:21 PM

## 2021-03-02 DIAGNOSIS — T8459XA Infection and inflammatory reaction due to other internal joint prosthesis, initial encounter: Secondary | ICD-10-CM

## 2021-03-02 DIAGNOSIS — I480 Paroxysmal atrial fibrillation: Secondary | ICD-10-CM

## 2021-03-02 DIAGNOSIS — A419 Sepsis, unspecified organism: Secondary | ICD-10-CM

## 2021-03-02 DIAGNOSIS — N179 Acute kidney failure, unspecified: Secondary | ICD-10-CM | POA: Diagnosis not present

## 2021-03-02 DIAGNOSIS — G9341 Metabolic encephalopathy: Secondary | ICD-10-CM

## 2021-03-02 DIAGNOSIS — B9689 Other specified bacterial agents as the cause of diseases classified elsewhere: Secondary | ICD-10-CM

## 2021-03-02 DIAGNOSIS — L89154 Pressure ulcer of sacral region, stage 4: Secondary | ICD-10-CM

## 2021-03-02 DIAGNOSIS — M71162 Other infective bursitis, left knee: Secondary | ICD-10-CM

## 2021-03-02 DIAGNOSIS — R4182 Altered mental status, unspecified: Secondary | ICD-10-CM

## 2021-03-02 DIAGNOSIS — I739 Peripheral vascular disease, unspecified: Secondary | ICD-10-CM

## 2021-03-02 DIAGNOSIS — L89514 Pressure ulcer of right ankle, stage 4: Secondary | ICD-10-CM

## 2021-03-02 DIAGNOSIS — Z96659 Presence of unspecified artificial knee joint: Secondary | ICD-10-CM

## 2021-03-02 LAB — COMPREHENSIVE METABOLIC PANEL
ALT: 11 U/L (ref 0–44)
AST: 19 U/L (ref 15–41)
Albumin: 1.4 g/dL — ABNORMAL LOW (ref 3.5–5.0)
Alkaline Phosphatase: 59 U/L (ref 38–126)
Anion gap: 4 — ABNORMAL LOW (ref 5–15)
BUN: 75 mg/dL — ABNORMAL HIGH (ref 8–23)
CO2: 20 mmol/L — ABNORMAL LOW (ref 22–32)
Calcium: 8 mg/dL — ABNORMAL LOW (ref 8.9–10.3)
Chloride: 110 mmol/L (ref 98–111)
Creatinine, Ser: 1.98 mg/dL — ABNORMAL HIGH (ref 0.44–1.00)
GFR, Estimated: 24 mL/min — ABNORMAL LOW (ref >60)
Glucose, Bld: 78 mg/dL (ref 70–99)
Potassium: 4.5 mmol/L (ref 3.5–5.1)
Sodium: 134 mmol/L — ABNORMAL LOW (ref 135–145)
Total Bilirubin: 0.7 mg/dL (ref 0.3–1.2)
Total Protein: 5.7 g/dL — ABNORMAL LOW (ref 6.5–8.1)

## 2021-03-02 LAB — CULTURE, BLOOD (SINGLE)
Culture: NO GROWTH
Special Requests: ADEQUATE

## 2021-03-02 LAB — CBC
HCT: 23.3 % — ABNORMAL LOW (ref 36.0–46.0)
Hemoglobin: 7.5 g/dL — ABNORMAL LOW (ref 12.0–15.0)
MCH: 30 pg (ref 26.0–34.0)
MCHC: 32.2 g/dL (ref 30.0–36.0)
MCV: 93.2 fL (ref 80.0–100.0)
Platelets: 270 10*3/uL (ref 150–400)
RBC: 2.5 MIL/uL — ABNORMAL LOW (ref 3.87–5.11)
RDW: 15.7 % — ABNORMAL HIGH (ref 11.5–15.5)
WBC: 14.6 10*3/uL — ABNORMAL HIGH (ref 4.0–10.5)
nRBC: 0 % (ref 0.0–0.2)

## 2021-03-02 MED ORDER — SENNOSIDES-DOCUSATE SODIUM 8.6-50 MG PO TABS
1.0000 | ORAL_TABLET | Freq: Every day | ORAL | Status: DC
  Administered 2021-03-02 – 2021-03-09 (×6): 1 via ORAL
  Filled 2021-03-02 (×6): qty 1

## 2021-03-02 MED ORDER — SODIUM CHLORIDE 0.9 % IV SOLN
8.0000 mg/kg | INTRAVENOUS | Status: DC
  Filled 2021-03-02: qty 10

## 2021-03-02 MED ORDER — POLYETHYLENE GLYCOL 3350 17 G PO PACK: 17.0000 g | PACK | Freq: Every day | ORAL | Status: DC | PRN

## 2021-03-02 MED ORDER — CEFTRIAXONE SODIUM 1 G IJ SOLR
2.0000 g | INTRAMUSCULAR | Status: DC
  Administered 2021-03-02: 2 g via INTRAMUSCULAR
  Filled 2021-03-02 (×2): qty 20

## 2021-03-02 MED ORDER — POVIDONE-IODINE 10 % EX SWAB
2.0000 "application " | Freq: Once | CUTANEOUS | Status: AC
  Administered 2021-03-03 (×2): 2 via TOPICAL

## 2021-03-02 MED ORDER — CEFAZOLIN SODIUM-DEXTROSE 2-4 GM/100ML-% IV SOLN
2.0000 g | INTRAVENOUS | Status: DC
  Filled 2021-03-02: qty 100

## 2021-03-02 MED ORDER — CHLORHEXIDINE GLUCONATE 4 % EX LIQD
60.0000 mL | Freq: Once | CUTANEOUS | Status: AC
  Administered 2021-03-03: 4 via TOPICAL
  Filled 2021-03-02: qty 60

## 2021-03-02 NOTE — Progress Notes (Signed)
PROGRESS NOTE    [name]  IRW:[id number] DOB: [birth year] DOA: 02/24/2021 PCP: Rosita Fire, MD   Chief Complaint  Patient presents with  . Weakness  Brief Narrative: 87 year old female with hypothyroidism, PAF, B12 deficiency, dementia, hypertension PVD arthritis was admitted 4/10 at [facility] due to increaseing confusion and generalized weakness.  She was found to have severe sepsis due to lower extremity cellulitis and there was concern for left septic knee arthritis-orthopedic was consulted so transferred to [facility] for evaluation.  Her urine culture has been growing Enterococcus Avium. Patient is being treated for severe sepsis, acute renal failure, Enterococcus UTI, hyponatremia, hypokalemia, acute metabolic encephalopathy in the setting of dementia with baseline cognitive and memory deficit. Being managed with vancomycin, cefepime 4/14- s/p lt knee aspiration  Subjective: Seen and examined this morning alert awake confused. Afebrile overnight Leukocytosis downtrending  Assessment & Plan:  Severe Sepsis due to left leg cellulitis, possible left septic knee: On vancomycin and cefepime, pharmacy dosing.  Still with leukocytosis but downtrending, blood culture no growth so far.  Patient had leukocytosis tachycardia and tachypnea on admission.  Vital stable, status post left aspiration-gram stain shows abundant gram-positive cocci in pairs culture report pending, consulted ID, continue IV antibiotics, await further plans from orthopedics.awaiting ortho's debridement. Recent Labs  Lab 02/23/2021 2005 02/16/2021 2302 02/26/21 0534 02/28/21 0511 03/01/21 0211 03/02/21 0151  WBC 17.2*  --  15.1* 18.7* 16.6* 14.6*  LATICACIDVEN 1.4 0.9  --   --   --   --    Possible left septic knee: With leukocytosis and elevated CRP 14.2 ESR 58.  Left knee with considerable soft tissue swelling, status postoperative treatment of distal femur fracture on 01/14/2019 by Dr.  Lyla Glassing. Dr Denton Brick discussed with [facility] Mr. Erick Colace (Dr Rex Kras) and was transferred to [facility] for orthopedic evaluation and  had knee aspiration-WBC 100,000 with gram stain showing GPC, culture pending.    Acute kidney injury: Baseline creatinine level was 0.7-1.0.  Creatinine slowly improving continue IV fluid hydration.  Monitor urine output Recent Labs  Lab 03/08/2021 2005 02/26/21 0534 02/28/21 0511 03/01/21 0211 03/02/21 0151  BUN 72* 70* 68* 68* 75*  CREATININE 2.53* 2.17* 2.04* 2.07* 7.61*   Acute metabolic encephalopathy Dementia with cognitive and memory impairment confusion in the setting of underlying dementia with cognitive and memory impairment.  Continue delirium precaution fall precaution supportive care OT orientation.  Deficit  Enterococcus UTI continue vancomycin.   Hyponatremia/hypokalemia: Electrolytes stable  History of B12 and iron deficiency anemia: Hemoglobin appears to be downtrending continue supplementation. Monitor and transfuse < 7 gm Recent Labs  Lab 03/15/2021 2005 02/26/21 0534 02/28/21 0511 03/01/21 0211 03/02/21 0151  HGB 10.2* 9.5* 8.5* 8.7* 7.5*  HCT 31.9* 30.3* 26.6* 26.9* 23.3*   Essential hypertension: BP soft in 90s to low 100, not on antihypertensive.  PVD: Continue her aspirin, Pletal GERD: Continue PPI Hypothyroidism: Continue Synthroid Paroxysmal A-fib hx: Not on anticoagulation.  Continue metoprolol with holding parameters  Diet Order            DIET SOFT Room service appropriate? No; Fluid consistency: Thin  Diet effective now                 Nutrition Problem: Increased nutrient needs Etiology: wound healing (3 stage 4 PI and a DTI to left heel) Signs/Symptoms: estimated needs Interventions: Ensure Enlive (each supplement provides 350kcal and 20 grams of protein),MVI,Juven,Prostat Patient's Body mass index is 23.19 kg/m.  Pressure  Injury 02/16/2021 Sacrum Mid Stage 4 - Full thickness tissue loss with  exposed bone, tendon or muscle. tunneling (Active)  03/15/2021 2105  Location: Sacrum  Location Orientation: Mid  Staging: Stage 4 - Full thickness tissue loss with exposed bone, tendon or muscle.  Wound Description (Comments): tunneling  Present on Admission: Yes     Pressure Injury 02/19/2021 Sacrum Mid Stage 4 - Full thickness tissue loss with exposed bone, tendon or muscle. tunneling (Active)  02/20/2021 2106  Location: Sacrum  Location Orientation: Mid  Staging: Stage 4 - Full thickness tissue loss with exposed bone, tendon or muscle.  Wound Description (Comments): tunneling  Present on Admission: Yes     Pressure Injury 02/26/21 Ankle Right;Lateral Stage 4 - Full thickness tissue loss with exposed bone, tendon or muscle. (Active)  02/26/21 0202  Location: Ankle  Location Orientation: Right;Lateral  Staging: Stage 4 - Full thickness tissue loss with exposed bone, tendon or muscle.  Wound Description (Comments):   Present on Admission:      Pressure Injury 02/26/21 Foot Left;Lateral Deep Tissue Pressure Injury - Purple or maroon localized area of discolored intact skin or blood-filled blister due to damage of underlying soft tissue from pressure and/or shear. (Active)  02/26/21 0158  Location: Foot  Location Orientation: Left;Lateral  Staging: Deep Tissue Pressure Injury - Purple or maroon localized area of discolored intact skin or blood-filled blister due to damage of underlying soft tissue from pressure and/or shear.  Wound Description (Comments):   Present on Admission: Yes    DVT prophylaxis: enoxaparin (LOVENOX) injection 30 mg Start: 03/02/21 1000 SCDs Start: 02/26/21 0147 Code Status:   Code Status: Full Code  Family Communication: plan of care discussed with patient at bedside.  Status is: Inpatient Remains inpatient appropriate because:IV treatments appropriate due to intensity of illness or inability to take PO and Inpatient level of care appropriate due to severity of  illness  Dispo: The patient is from: Home              Anticipated d/c is to: TBD              Patient currently is not medically stable to d/c.   Difficult to place patient No Unresulted Labs (From admission, onward)          Start     Ordered   03/02/21 0500  CBC  Daily,   R      03/01/21 0932   03/02/21 0500  Comprehensive metabolic panel  Daily,   R      03/01/21 0932        Medications reviewed:  Scheduled Meds: . aspirin EC  81 mg Oral Q breakfast  . bupivacaine  10 mL Infiltration Once  . chlorhexidine  60 mL Topical Once  . Chlorhexidine Gluconate Cloth  6 each Topical Daily  . cilostazol  100 mg Oral BID  . enoxaparin (LOVENOX) injection  30 mg Subcutaneous Q24H  . feeding supplement  237 mL Oral TID BM  . ferrous sulfate  325 mg Oral Q breakfast  . levothyroxine  100 mcg Oral Q0600  . metoprolol tartrate  12.5 mg Oral BID  . multivitamin with minerals  1 tablet Oral Daily  . nutrition supplement (JUVEN)  1 packet Oral BID BM  . pantoprazole  40 mg Oral QAC breakfast  . povidone-iodine  2 application Topical Once  . vitamin B-12  1,000 mcg Oral Daily   Continuous Infusions: . sodium chloride 30 mL/hr at 02/28/21 2300  .  ceFAZolin (ANCEF) IV    . ceFEPime (MAXIPIME) IV 2 g (03/01/21 2345)  . vancomycin 750 mg (03/01/21 2223)    Consultants:see note  Procedures:see note  Antimicrobials: Anti-infectives (From admission, onward)   Start     Dose/Rate Route Frequency Ordered Stop   03/02/21 0600  ceFAZolin (ANCEF) IVPB 2g/100 mL premix        2 g 200 mL/hr over 30 Minutes Intravenous On call to O.R. 03/02/21 0110 02/24/2021 0559   02/27/21 2200  vancomycin (VANCOREADY) IVPB 750 mg/150 mL        750 mg 150 mL/hr over 60 Minutes Intravenous Every 48 hours 02/18/2021 2342     03/10/2021 2315  vancomycin (VANCOCIN) IVPB 1000 mg/200 mL premix        1,000 mg 200 mL/hr over 60 Minutes Intravenous  Once 03/11/2021 2313 02/26/21 0115   02/24/2021 2315  ceFEPIme (MAXIPIME)  2 g in sodium chloride 0.9 % 100 mL IVPB        2 g 200 mL/hr over 30 Minutes Intravenous Every 24 hours 03/16/2021 2313       Culture/Microbiology    Component Value Date/Time   SDES FLUID 03/01/2021 1102   SPECREQUEST SYNOVIAL LEFT KNEE 03/01/2021 1102   CULT PENDING 03/01/2021 1102   REPTSTATUS PENDING 03/01/2021 1102    Other culture-see note  Objective: Vitals: Today's Vitals   03/01/21 1125 03/01/21 1358 03/01/21 2020 03/02/21 0445  BP:  97/66 105/65 102/68  Pulse:  76 78 75  Resp:  16 17 18   Temp:  98 F (36.7 C) 98 F (36.7 C) 98.2 F (36.8 C)  TempSrc:  Oral Oral Oral  SpO2:  99% 98% 98%  Weight:      Height:      PainSc: 6   0-No pain    No intake or output data in the 24 hours ending 03/02/21 0739 Filed Weights   02/17/2021 1928 02/26/21 0148 02/28/21 0500  Weight: 63.6 kg 61.2 kg 61.3 kg   Weight change:   Intake/Output from previous day: No intake/output data recorded. Intake/Output this shift: No intake/output data recorded. Filed Weights   02/24/2021 1928 02/26/21 0148 02/28/21 0500  Weight: 63.6 kg 61.2 kg 61.3 kg    Examination: General exam: AAOx1-2, ill looking, elderly, NAD, weak appearing. HEENT:Oral mucosa moist, Ear/Nose WNL grossly, dentition normal. Respiratory system: bilaterally diminished,no wheezing or crackles,no use of accessory muscle Cardiovascular system: S1 & S2 +, No JVD,. Gastrointestinal system: Abdomen soft, NT,ND, BS+ Nervous System:Alert, awake, moving extremities and grossly nonfocal Extremities: No edema, distal peripheral pulses palpable.  Left knee area is tender left lower extremity swollen Skin: No rashes,no icterus. MSK: Normal muscle bulk,tone, power  Data Reviewed: I have personally reviewed following labs and imaging studies CBC: Recent Labs  Lab 02/28/2021 2005 02/26/21 0534 02/28/21 0511 03/01/21 0211 03/02/21 0151  WBC 17.2* 15.1* 18.7* 16.6* 14.6*  NEUTROABS 15.1*  --   --   --   --   HGB 10.2* 9.5*  8.5* 8.7* 7.5*  HCT 31.9* 30.3* 26.6* 26.9* 23.3*  MCV 94.7 95.9 95.0 93.4 93.2  PLT 375 327 290 317 060   Basic Metabolic Panel: Recent Labs  Lab 02/26/2021 2005 02/26/21 0534 02/28/21 0511 03/01/21 0211 03/02/21 0151  NA 129* 134* 133* 133* 134*  K 4.5 3.8 3.4* 4.6 4.5  CL 92* 100 102 106 110  CO2 25 22 22 23  20*  GLUCOSE 89 78 86 104* 78  BUN 72* 70* 68* 68* 75*  CREATININE 2.53* 2.17* 2.04* 2.07* 1.98*  CALCIUM 8.6* 8.2* 8.0* 8.1* 8.0*  MG  --  2.0 1.9  --   --   PHOS  --  4.3  --  2.9  --    GFR: Estimated Creatinine Clearance: 16.6 mL/min (A) (by C-G formula based on SCr of 1.98 mg/dL (H)). Liver Function Tests: Recent Labs  Lab 02/19/2021 2005 02/26/21 0534 03/01/21 0211 03/02/21 0151  AST 14* 13*  --  19  ALT 9 8  --  11  ALKPHOS 80 69  --  59  BILITOT 0.7 0.7  --  0.7  PROT 7.1 6.4*  --  5.7*  ALBUMIN 2.3* 2.0* 1.6* 1.4*   No results for input(s): LIPASE, AMYLASE in the last 168 hours. No results for input(s): AMMONIA in the last 168 hours. Coagulation Profile: Recent Labs  Lab 02/28/2021 2005 02/26/21 0534  INR 1.2 1.3*   Cardiac Enzymes: No results for input(s): CKTOTAL, CKMB, CKMBINDEX, TROPONINI in the last 168 hours. BNP (last 3 results) No results for input(s): PROBNP in the last 8760 hours. HbA1C: No results for input(s): HGBA1C in the last 72 hours. CBG: Recent Labs  Lab 03/09/2021 1939  GLUCAP 84   Lipid Profile: No results for input(s): CHOL, HDL, LDLCALC, TRIG, CHOLHDL, LDLDIRECT in the last 72 hours. Thyroid Function Tests: No results for input(s): TSH, T4TOTAL, FREET4, T3FREE, THYROIDAB in the last 72 hours. Anemia Panel: No results for input(s): VITAMINB12, FOLATE, FERRITIN, TIBC, IRON, RETICCTPCT in the last 72 hours. Sepsis Labs: Recent Labs  Lab 03/12/2021 2005 03/12/2021 2302  LATICACIDVEN 1.4 0.9    Recent Results (from the past 240 hour(s))  Blood culture (routine single)     Status: None (Preliminary result)   Collection  Time: 02/27/2021  8:57 PM   Specimen: Right Antecubital; Blood  Result Value Ref Range Status   Specimen Description RIGHT ANTECUBITAL  Final   Special Requests   Final    BOTTLES DRAWN AEROBIC AND ANAEROBIC Blood Culture adequate volume   Culture   Final    NO GROWTH 4 DAYS Performed at [facility], [address] [place], [place] [address]    Report Status PENDING  Incomplete  Resp Panel by RT-PCR (Flu A&B, Covid) Nasopharyngeal Swab     Status: None   Collection Time: 02/23/2021  9:00 PM   Specimen: Nasopharyngeal Swab; Nasopharyngeal(NP) swabs in vial transport medium  Result Value Ref Range Status   SARS Coronavirus 2 by RT PCR NEGATIVE NEGATIVE Final    Comment: (NOTE) SARS-CoV-2 target nucleic acids are NOT DETECTED.  The SARS-CoV-2 RNA is generally detectable in upper respiratory specimens during the acute phase of infection. The lowest concentration of SARS-CoV-2 viral copies this assay can detect is 138 copies/mL. A negative result does not preclude SARS-Cov-2 infection and should not be used as the sole basis for treatment or other patient management decisions. A negative result may occur with  improper specimen collection/handling, submission of specimen other than nasopharyngeal swab, presence of viral mutation(s) within the areas targeted by this assay, and inadequate number of viral copies(<138 copies/mL). A negative result must be combined with clinical observations, patient history, and epidemiological information. The expected result is Negative.  Fact Sheet for Patients:  EntrepreneurPulse.com.au  Fact Sheet for Healthcare Providers:  IncredibleEmployment.be  This test is no t yet approved or cleared by the [place] FDA and  has been authorized for detection and/or diagnosis of SARS-CoV-2 by FDA under an Emergency Use Authorization (EUA).  This EUA will remain  in effect (meaning this test can be used) for the duration of  the COVID-19 declaration under Section 564(b)(1) of the Act, 21 U.S.C.section 360bbb-3(b)(1), unless the authorization is terminated  or revoked sooner.       Influenza A by PCR NEGATIVE NEGATIVE Final   Influenza B by PCR NEGATIVE NEGATIVE Final    Comment: (NOTE) The Xpert Xpress SARS-CoV-2/FLU/RSV plus assay is intended as an aid in the diagnosis of influenza from Nasopharyngeal swab specimens and should not be used as a sole basis for treatment. Nasal washings and aspirates are unacceptable for Xpert Xpress SARS-CoV-2/FLU/RSV testing.  Fact Sheet for Patients: EntrepreneurPulse.com.au  Fact Sheet for Healthcare Providers: IncredibleEmployment.be  This test is not yet approved or cleared by the [place] FDA and has been authorized for detection and/or diagnosis of SARS-CoV-2 by FDA under an Emergency Use Authorization (EUA). This EUA will remain in effect (meaning this test can be used) for the duration of the COVID-19 declaration under Section 564(b)(1) of the Act, 21 U.S.C. section 360bbb-3(b)(1), unless the authorization is terminated or revoked.  Performed at [facility], [address] [place], [place] [address]   Urine culture     Status: Abnormal   Collection Time: 03/15/2021 11:57 PM   Specimen: In/Out Cath Urine  Result Value Ref Range Status   Specimen Description   Final    IN/OUT CATH URINE Performed at [facility], [address], [place], [place] [address]    Special Requests   Final    NONE Performed at [facility], [address] [place], [place] [address]    Culture 80,000 COLONIES/mL ENTEROCOCCUS AVIUM (A)  Final   Report Status 03/01/2021 FINAL  Final   Organism ID, Bacteria ENTEROCOCCUS AVIUM (A)  Final      Susceptibility   Enterococcus avium - MIC*    AMPICILLIN 16 RESISTANT Resistant     NITROFURANTOIN <=16 SENSITIVE Sensitive     VANCOMYCIN <=0.5 SENSITIVE Sensitive     * 80,000 COLONIES/mL  ENTEROCOCCUS AVIUM  Body fluid culture w Gram Stain     Status: None (Preliminary result)   Collection Time: 03/01/21 11:02 AM   Specimen: Body Fluid  Result Value Ref Range Status   Specimen Description FLUID  Final   Special Requests SYNOVIAL LEFT KNEE  Final   Gram Stain   Final    ABUNDANT WBC PRESENT, PREDOMINANTLY MONONUCLEAR ABUNDANT GRAM POSITIVE COCCI IN PAIRS IN CHAINS Performed at [facility], [place] [address], [place], [place] [address]    Culture PENDING  Incomplete   Report Status PENDING  Incomplete     Radiology Studies: No results found.   LOS: 5 days   Antonieta Pert, MD [facility]  03/02/2021, 7:39 AM

## 2021-03-02 NOTE — Plan of Care (Signed)

## 2021-03-02 NOTE — Progress Notes (Signed)
  Speech Language Pathology Treatment: Dysphagia  Patient Details Name: [name] MRN: [id number] DOB: Jun 06, 1929 Today's Date: 03/02/2021 Time: 1962-2297 SLP Time Calculation (min) (ACUTE ONLY): 15.35 min  Assessment / Plan / Recommendation Clinical Impression  Pt was seen for dysphagia treatment. She was alert and pleasantly confused throughout the session. Pt was speaking throughout and after the session despite the absence of responses or conversational partners. Pt consumed thin liquids without overt s/sx of aspiration and swallowing was timely. She demonstrated prolonged mastication of soft (chopped) solids, but mastication was ineffective and whole pieces of pears were removed with oral swab following >4 minutes of mastication of two pieces of pears. Pt demonstrated gagging behaviors with purees and spat these boluses out despite encouragement. Pt's lunch tray was clear, but pt's RN reported that the pt demonstrated similar behaviors with lunch and therefore did not eat anything. It was suspected that the meal was therefore completed by the pt's family. Pt's diet will be downgraded to dysphagia 2 and thin liquids. Pt's impairments appear to be cognitively based in the setting of baseline dementia with increased confusion and UTI. SLP will continue to follow pt to assess diet tolerance and improvement.    HPI HPI: Pt is an 87 y.o. female with medical history significant for hypothyroidism, dementia, GERD, hypertension, PVD and arthritis who presented to the [facility] ED via EMS due to 4-day onset of confusion and weakness. Per report, patient was unable to swallow within past 3 to 4 days prior to admission, she presented with gagging and choking sensation on eating.  Pt was admitted to the hospital from 2/21-3/2 due to left distal femur fracture s/p left distal femoral replacement. Per EMR, pt pt's cognition waxes and wanes from day to day at baseline, but she is typically able to speak and  indicate her wants and needs. She was found to have severe sepsis due to lower extremity cellulitis and there was concern for left septic knee arthritis; ortho was consulted and pt was transferred to [facility] for evaluation. Pt diagnosed with severe sepsis, acute renal failure, Enterococcus UTI, hyponatremia, hypokalemia, and acute metabolic encephalopathy. Pt s/p left knee aspiration and injection 4/14.      SLP Plan  Continue with current plan of care       Recommendations  Diet recommendations: Dysphagia 2 (fine chop);Thin liquid Liquids provided via: Cup;Straw Medication Administration: Crushed with puree Compensations: Slow rate;Small sips/bites;Follow solids with liquid Postural Changes and/or Swallow Maneuvers: Seated upright 90 degrees                Oral Care Recommendations: Oral care BID;Staff/trained caregiver to provide oral care Follow up Recommendations: 24 hour supervision/assistance SLP Visit Diagnosis: Dysphagia, unspecified (R13.10) Plan: Continue with current plan of care       Edsel Shives I. Hardin Negus, [place], [place] Office number [phone] Pager [place] 03/02/2021, 5:27 PM

## 2021-03-02 NOTE — Consult Note (Signed)
[facility] for Infectious Disease    Date of Admission:  02/21/2021     Total days of antibiotics 6               Reason for Consult: Prosthetic Joint Septic Arthritis  Referring Provider:  Maren Poole Primary Care Provider: Rosita Fire, MD   ASSESSMENT:  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 Plan for I&D of the left knee tomorrow.  Narrow antibiotics to Daptomycin and ceftriaxone. Consider imaging of sacral and ankle pressure ulcers. Will need offloading of sites as well as optimization of nutrition to give this even a reasonable ability to heal.   PLAN:  1. Change antibiotics to Daptomycin and ceftriaxone.  2. Monitor CK levels per protocol.  3. Pressure injury/wound care per Wound RN recommendations with consideration for imaging.  4. I&D of left knee tomorrow. 5. Monitor aspiration cultures of left knee for organism identification.  6. Off loading of site and optimize nutrition.  Principal Problem:   Severe Sepsis due to left leg cellulitis and possible Lt septic knee and Enterococcus UTI Active Problems:   Essential hypertension   PERIPHERAL VASCULAR DISEASE   GERD   Hypothyroidism   Acute kidney injury (HCC)   Pressure injury of skin   Generalized weakness   Acute metabolic encephalopathy   Leukocytosis   Hyponatremia   Dehydration   Hypoalbuminemia   Elevated brain natriuretic peptide (BNP) level   Paroxysmal A-fib ([place])   . aspirin EC  81 mg Oral Q breakfast  . bupivacaine  10 mL Infiltration Once  . cefTRIAXone (ROCEPHIN) IM  2 g Intramuscular Q24H  . chlorhexidine  60 mL Topical Once  . Chlorhexidine Gluconate Cloth  6 each Topical Daily  . cilostazol  100 mg Oral BID  . enoxaparin (LOVENOX) injection  30 mg Subcutaneous Q24H  . feeding supplement  237 mL Oral TID BM  . ferrous  sulfate  325 mg Oral Q breakfast  . levothyroxine  100 mcg Oral Q0600  . metoprolol tartrate  12.5 mg Oral BID  . multivitamin with minerals  1 tablet Oral Daily  . nutrition supplement (JUVEN)  1 packet Oral BID BM  . pantoprazole  40 mg Oral QAC breakfast  . povidone-iodine  2 application Topical Once  . vitamin B-12  1,000 mcg Oral Daily     HPI: [name] is a 87 y.o. female with previous medical history significant for hypertension and hypothyroidism admitted with progressively worsening weakness and altered mental status.   [name] was recently admitted from 2/21-3/2 with left distal femur fracture s/p left distal femoral replacement by Dr. Lyla Glassing. Discharged to SNF and was reportedly not getting out of bed.  Lab work in the ED with leukocytosis of 17.2, BNP 400, and creatinine of 2.53 (previous 1.18). Chest x-ray with no acute abnormality; CT head with no acute abnormality with moderate diffuse cerebral and cerebellar atrophy; left knee x-rays s/p left knee replacement with intact hardware and normal alignment with and considerable soft tissue swelling; and lower extremity dopplers negative for DVT. Left knee aspiration with cultures without growth in 24 hours and gram positive cocci in pairs in chains on gram stain. Synovial fluid with 100,000 WBC.    [name] also has 2 separate chronic Stage 4 pressure injuries on the sacrum  and right ankle Stage 4 pressure injury evaluated by Wound RN with topical treatments and Aquacel ordered. There has been no imaging of the pressure injuries and Sed rate is 58 with CRP of 14.2   [name] has been afebrile since admission and is currently on Day 6 of antibiotic therapy with vancomycin and cefepime. Blood culture from 4/10 has been without growth. Scheduled for I&D of the left knee tomorrow with Dr. Lyla Glassing.   Review of Systems: Review of Systems  Unable to perform ROS: Mental acuity     Past Medical History:  Diagnosis Date  .  Arthritis   . Gastritis, Helicobacter pylori    Treated with Pylera, 2010  . Hypertension   . Hypothyroidism     Social History   Tobacco Use  . Smoking status: Never Smoker  . Smokeless tobacco: Never Used  Substance Use Topics  . Alcohol use: No  . Drug use: No    Family History  Problem Relation Age of Onset  . Colon cancer Neg Hx     Allergies  Allergen Reactions  . Amoxicillin Other (See Comments)    Burning sensation.  Marland Kitchen Penicillins Other (See Comments)    Burning sensation.    OBJECTIVE: Blood pressure (!) 107/50, pulse 99, temperature 98.2 F (36.8 C), temperature source Oral, resp. rate 17, height 5' 4.02" (1.626 m), weight 99 kg, SpO2 100 %.  Physical Exam Constitutional:      General: She is not in acute distress.    Appearance: She is well-developed.     Comments: Lying in bed; confused.   Cardiovascular:     Rate and Rhythm: Normal rate and regular rhythm.     Heart sounds: Normal heart sounds.  Pulmonary:     Effort: Pulmonary effort is normal.     Breath sounds: Normal breath sounds.  Musculoskeletal:     Comments: Left knee with previous surgical incision that is well approximated and with warmth and edema. Tender to touch and movement.   Skin:    General: Skin is warm and dry.     Lab Results Lab Results  Component Value Date   WBC 14.6 (H) 03/02/2021   HGB 7.5 (L) 03/02/2021   HCT 23.3 (L) 03/02/2021   MCV 93.2 03/02/2021   PLT 270 03/02/2021    Lab Results  Component Value Date   CREATININE 1.98 (H) 03/02/2021   BUN 75 (H) 03/02/2021   NA 134 (L) 03/02/2021   K 4.5 03/02/2021   CL 110 03/02/2021   CO2 20 (L) 03/02/2021    Lab Results  Component Value Date   ALT 11 03/02/2021   AST 19 03/02/2021   ALKPHOS 59 03/02/2021   BILITOT 0.7 03/02/2021     Microbiology: Recent Results (from the past 240 hour(s))  Blood culture (routine single)     Status: None   Collection Time: 02/20/2021  8:57 PM   Specimen: Right Antecubital;  Blood  Result Value Ref Range Status   Specimen Description RIGHT ANTECUBITAL  Final   Special Requests   Final    BOTTLES DRAWN AEROBIC AND ANAEROBIC Blood Culture adequate volume   Culture   Final    NO GROWTH 5 DAYS Performed at [facility], [address], [place], [place] [address]    Report Status 03/02/2021 FINAL  Final  Resp Panel by RT-PCR (Flu A&B, Covid) Nasopharyngeal Swab     Status: None   Collection Time: 02/20/2021  9:00 PM   Specimen: Nasopharyngeal Swab;  Nasopharyngeal(NP) swabs in vial transport medium  Result Value Ref Range Status   SARS Coronavirus 2 by RT PCR NEGATIVE NEGATIVE Final    Comment: (NOTE) SARS-CoV-2 target nucleic acids are NOT DETECTED.  The SARS-CoV-2 RNA is generally detectable in upper respiratory specimens during the acute phase of infection. The lowest concentration of SARS-CoV-2 viral copies this assay can detect is 138 copies/mL. A negative result does not preclude SARS-Cov-2 infection and should not be used as the sole basis for treatment or other patient management decisions. A negative result may occur with  improper specimen collection/handling, submission of specimen other than nasopharyngeal swab, presence of viral mutation(s) within the areas targeted by this assay, and inadequate number of viral copies(<138 copies/mL). A negative result must be combined with clinical observations, patient history, and epidemiological information. The expected result is Negative.  Fact Sheet for Patients:  EntrepreneurPulse.com.au  Fact Sheet for Healthcare Providers:  IncredibleEmployment.be  This test is no t yet approved or cleared by the [place] FDA and  has been authorized for detection and/or diagnosis of SARS-CoV-2 by FDA under an Emergency Use Authorization (EUA). This EUA will remain  in effect (meaning this test can be used) for the duration of the COVID-19 declaration under Section 564(b)(1)  of the Act, 21 U.S.C.section 360bbb-3(b)(1), unless the authorization is terminated  or revoked sooner.       Influenza A by PCR NEGATIVE NEGATIVE Final   Influenza B by PCR NEGATIVE NEGATIVE Final    Comment: (NOTE) The Xpert Xpress SARS-CoV-2/FLU/RSV plus assay is intended as an aid in the diagnosis of influenza from Nasopharyngeal swab specimens and should not be used as a sole basis for treatment. Nasal washings and aspirates are unacceptable for Xpert Xpress SARS-CoV-2/FLU/RSV testing.  Fact Sheet for Patients: EntrepreneurPulse.com.au  Fact Sheet for Healthcare Providers: IncredibleEmployment.be  This test is not yet approved or cleared by the [place] FDA and has been authorized for detection and/or diagnosis of SARS-CoV-2 by FDA under an Emergency Use Authorization (EUA). This EUA will remain in effect (meaning this test can be used) for the duration of the COVID-19 declaration under Section 564(b)(1) of the Act, 21 U.S.C. section 360bbb-3(b)(1), unless the authorization is terminated or revoked.  Performed at [facility], [address] [place], [place] [address]   Urine culture     Status: Abnormal   Collection Time: 03/09/2021 11:57 PM   Specimen: In/Out Cath Urine  Result Value Ref Range Status   Specimen Description   Final    IN/OUT CATH URINE Performed at [facility], [address] [place], [place] [address]    Special Requests   Final    NONE Performed at [facility], [address] [place], [place] [address]    Culture 80,000 COLONIES/mL ENTEROCOCCUS AVIUM (A)  Final   Report Status 03/01/2021 FINAL  Final   Organism ID, Bacteria ENTEROCOCCUS AVIUM (A)  Final      Susceptibility   Enterococcus avium - MIC*    AMPICILLIN 16 RESISTANT Resistant     NITROFURANTOIN <=16 SENSITIVE Sensitive     VANCOMYCIN <=0.5 SENSITIVE Sensitive     * 80,000 COLONIES/mL ENTEROCOCCUS AVIUM  Body fluid culture w Gram Stain      Status: None (Preliminary result)   Collection Time: 03/01/21 11:02 AM   Specimen: Body Fluid  Result Value Ref Range Status   Specimen Description FLUID  Final   Special Requests SYNOVIAL LEFT KNEE  Final   Gram Stain   Final  ABUNDANT WBC PRESENT, PREDOMINANTLY MONONUCLEAR ABUNDANT GRAM POSITIVE COCCI IN PAIRS IN CHAINS    Culture   Final    NO GROWTH < 24 HOURS Performed at [facility] [address], [place], [place] [address]    Report Status PENDING  Incomplete     Terri Piedra, [facility]  03/02/2021  1:33 PM

## 2021-03-02 NOTE — Anesthesia Preprocedure Evaluation (Addendum)
Anesthesia Evaluation  Patient identified by MRN, date of birth, ID band Patient awake    Reviewed: Allergy & Precautions, H&P , NPO status , Patient's Chart, lab work & pertinent test results  Airway Mallampati: II  TM Distance: >3 FB Neck ROM: Full    Dental no notable dental hx. (+) Edentulous Upper, Edentulous Lower, Dental Advisory Given   Pulmonary COPD,    Pulmonary exam normal breath sounds clear to auscultation       Cardiovascular Exercise Tolerance: Good hypertension, Pt. on medications and Pt. on home beta blockers + Peripheral Vascular Disease   Rhythm:Regular Rate:Normal     Neuro/Psych  Headaches, negative psych ROS   GI/Hepatic Neg liver ROS, GERD  Medicated,  Endo/Other  Hypothyroidism Morbid obesity  Renal/GU Renal InsufficiencyRenal disease  negative genitourinary   Musculoskeletal  (+) Arthritis , Osteoarthritis,    Abdominal   Peds  Hematology negative hematology ROS (+)   Anesthesia Other Findings   Reproductive/Obstetrics negative OB ROS                            Anesthesia Physical Anesthesia Plan  ASA: III  Anesthesia Plan: General   Post-op Pain Management:    Induction: Intravenous  PONV Risk Score and Plan: 4 or greater and Ondansetron, Dexamethasone and Treatment may vary due to age or medical condition  Airway Management Planned: Oral ETT  Additional Equipment:   Intra-op Plan:   Post-operative Plan: Extubation in OR  Informed Consent: I have reviewed the patients History and Physical, chart, labs and discussed the procedure including the risks, benefits and alternatives for the proposed anesthesia with the patient or authorized representative who has indicated his/her understanding and acceptance.     Dental advisory given and Consent reviewed with POA  Plan Discussed with: CRNA and Surgeon  Anesthesia Plan Comments:         Anesthesia Quick Evaluation

## 2021-03-02 NOTE — TOC Progression Note (Signed)
Transition of Care [facility]) - Progression Note    Patient Details  Name: [name] MRN: [id number] Date of Birth: 1929-02-01  Transition of Care [facility]) CM/SW Contact  Joanne Chars, LCSW Phone Number: 03/02/2021, 10:31 AM  Clinical Narrative:  CSW spoke with Esmond Camper at [facility] and they have accepted this referral.            Expected Discharge Plan and Services                                     HH Arranged: PT,OT [place] Agency: [place] ([place]) Date [place]: 03/02/21 Time [place]: 1030 Representative spoke with at [place]: [place] ([place]) Interventions    Readmission Risk Interventions No flowsheet data found.

## 2021-03-03 ENCOUNTER — Inpatient Hospital Stay (HOSPITAL_COMMUNITY): Payer: Medicare Other | Admitting: Anesthesiology

## 2021-03-03 ENCOUNTER — Encounter (HOSPITAL_COMMUNITY): Admission: EM | Disposition: E | Payer: Self-pay | Source: Home / Self Care | Attending: Critical Care Medicine

## 2021-03-03 ENCOUNTER — Inpatient Hospital Stay (HOSPITAL_COMMUNITY): Payer: Medicare Other

## 2021-03-03 ENCOUNTER — Encounter (HOSPITAL_COMMUNITY): Payer: Self-pay | Admitting: Internal Medicine

## 2021-03-03 DIAGNOSIS — N179 Acute kidney failure, unspecified: Secondary | ICD-10-CM | POA: Diagnosis not present

## 2021-03-03 DIAGNOSIS — G9341 Metabolic encephalopathy: Secondary | ICD-10-CM | POA: Diagnosis not present

## 2021-03-03 DIAGNOSIS — T8459XS Infection and inflammatory reaction due to other internal joint prosthesis, sequela: Secondary | ICD-10-CM | POA: Diagnosis not present

## 2021-03-03 DIAGNOSIS — Z96659 Presence of unspecified artificial knee joint: Secondary | ICD-10-CM | POA: Diagnosis not present

## 2021-03-03 HISTORY — PX: I & D KNEE WITH POLY EXCHANGE: SHX5024

## 2021-03-03 LAB — HEPATITIS PANEL, ACUTE
HCV Ab: NONREACTIVE
Hep A IgM: NONREACTIVE
Hep B C IgM: NONREACTIVE
Hepatitis B Surface Ag: NONREACTIVE

## 2021-03-03 LAB — COMPREHENSIVE METABOLIC PANEL
ALT: 13 U/L (ref 0–44)
AST: 24 U/L (ref 15–41)
Albumin: 1.4 g/dL — ABNORMAL LOW (ref 3.5–5.0)
Alkaline Phosphatase: 56 U/L (ref 38–126)
Anion gap: 7 (ref 5–15)
BUN: 75 mg/dL — ABNORMAL HIGH (ref 8–23)
CO2: 21 mmol/L — ABNORMAL LOW (ref 22–32)
Calcium: 8.2 mg/dL — ABNORMAL LOW (ref 8.9–10.3)
Chloride: 112 mmol/L — ABNORMAL HIGH (ref 98–111)
Creatinine, Ser: 1.97 mg/dL — ABNORMAL HIGH (ref 0.44–1.00)
GFR, Estimated: 24 mL/min — ABNORMAL LOW (ref >60)
Glucose, Bld: 88 mg/dL (ref 70–99)
Potassium: 4.3 mmol/L (ref 3.5–5.1)
Sodium: 140 mmol/L (ref 135–145)
Total Bilirubin: 0.4 mg/dL (ref 0.3–1.2)
Total Protein: 5.8 g/dL — ABNORMAL LOW (ref 6.5–8.1)

## 2021-03-03 LAB — CBC
HCT: 22.8 % — ABNORMAL LOW (ref 36.0–46.0)
Hemoglobin: 7.4 g/dL — ABNORMAL LOW (ref 12.0–15.0)
MCH: 30.3 pg (ref 26.0–34.0)
MCHC: 32.5 g/dL (ref 30.0–36.0)
MCV: 93.4 fL (ref 80.0–100.0)
Platelets: 255 10*3/uL (ref 150–400)
RBC: 2.44 MIL/uL — ABNORMAL LOW (ref 3.87–5.11)
RDW: 15.7 % — ABNORMAL HIGH (ref 11.5–15.5)
WBC: 13.6 10*3/uL — ABNORMAL HIGH (ref 4.0–10.5)
nRBC: 0 % (ref 0.0–0.2)

## 2021-03-03 LAB — PREPARE RBC (CROSSMATCH)

## 2021-03-03 LAB — CK: Total CK: 69 U/L (ref 38–234)

## 2021-03-03 LAB — HIV ANTIBODY (ROUTINE TESTING W REFLEX): HIV Screen 4th Generation wRfx: NONREACTIVE

## 2021-03-03 LAB — SEDIMENTATION RATE: Sed Rate: 75 mm/hr — ABNORMAL HIGH (ref 0–22)

## 2021-03-03 LAB — C-REACTIVE PROTEIN: CRP: 6.2 mg/dL — ABNORMAL HIGH (ref <1.0)

## 2021-03-03 SURGERY — IRRIGATION AND DEBRIDEMENT KNEE WITH POLY EXCHANGE
Anesthesia: General | Site: Knee | Laterality: Left

## 2021-03-03 MED ORDER — TOBRAMYCIN SULFATE 1.2 G IJ SOLR
INTRAMUSCULAR | Status: AC
  Filled 2021-03-03: qty 1.2

## 2021-03-03 MED ORDER — SODIUM CHLORIDE 0.9 % IV SOLN
10.0000 mL/h | Freq: Once | INTRAVENOUS | Status: AC
  Administered 2021-03-03: 10 mL/h via INTRAVENOUS

## 2021-03-03 MED ORDER — PROPOFOL 10 MG/ML IV BOLUS
INTRAVENOUS | Status: AC
  Filled 2021-03-03: qty 20

## 2021-03-03 MED ORDER — HYDROCODONE-ACETAMINOPHEN 5-325 MG PO TABS: 1.0000 | ORAL_TABLET | ORAL | Status: DC | PRN

## 2021-03-03 MED ORDER — POLYETHYLENE GLYCOL 3350 17 G PO PACK: 17.0000 g | PACK | Freq: Every day | ORAL | Status: DC | PRN

## 2021-03-03 MED ORDER — MORPHINE SULFATE (PF) 2 MG/ML IV SOLN
0.5000 mg | INTRAVENOUS | Status: DC | PRN
  Administered 2021-03-03 – 2021-03-04 (×2): 1 mg via INTRAVENOUS
  Filled 2021-03-03 (×2): qty 1

## 2021-03-03 MED ORDER — DEXAMETHASONE SODIUM PHOSPHATE 10 MG/ML IJ SOLN
10.0000 mg | Freq: Once | INTRAMUSCULAR | Status: AC
  Administered 2021-03-04: 10 mg via INTRAVENOUS
  Filled 2021-03-03: qty 1

## 2021-03-03 MED ORDER — MENTHOL 3 MG MT LOZG: 1.0000 | LOZENGE | OROMUCOSAL | Status: DC | PRN

## 2021-03-03 MED ORDER — METOCLOPRAMIDE HCL 5 MG PO TABS: 5.0000 mg | ORAL_TABLET | Freq: Three times a day (TID) | ORAL | Status: DC | PRN

## 2021-03-03 MED ORDER — SODIUM CHLORIDE 0.9 % IV SOLN: INTRAVENOUS | Status: DC

## 2021-03-03 MED ORDER — ACETAMINOPHEN 500 MG PO TABS: 1000.0000 mg | ORAL_TABLET | Freq: Once | ORAL | Status: DC

## 2021-03-03 MED ORDER — TRANEXAMIC ACID-NACL 1000-0.7 MG/100ML-% IV SOLN: 1000.0000 mg | INTRAVENOUS | Status: DC

## 2021-03-03 MED ORDER — TRANEXAMIC ACID-NACL 1000-0.7 MG/100ML-% IV SOLN
1000.0000 mg | Freq: Once | INTRAVENOUS | Status: AC
  Administered 2021-03-03: 1000 mg via INTRAVENOUS
  Filled 2021-03-03 (×2): qty 100

## 2021-03-03 MED ORDER — DIPHENHYDRAMINE HCL 12.5 MG/5ML PO ELIX: 12.5000 mg | ORAL_SOLUTION | ORAL | Status: DC | PRN

## 2021-03-03 MED ORDER — FENTANYL CITRATE (PF) 100 MCG/2ML IJ SOLN: 25.0000 ug | INTRAMUSCULAR | Status: DC | PRN

## 2021-03-03 MED ORDER — VANCOMYCIN HCL 500 MG IV SOLR
INTRAVENOUS | Status: AC
  Filled 2021-03-03: qty 500

## 2021-03-03 MED ORDER — SODIUM CHLORIDE 0.9 % IV SOLN
2.0000 g | INTRAVENOUS | Status: DC
  Administered 2021-03-03 – 2021-03-14 (×12): 2 g via INTRAVENOUS
  Filled 2021-03-03 (×9): qty 20
  Filled 2021-03-03 (×3): qty 2

## 2021-03-03 MED ORDER — ROCURONIUM BROMIDE 10 MG/ML (PF) SYRINGE
PREFILLED_SYRINGE | INTRAVENOUS | Status: DC | PRN
  Administered 2021-03-03: 50 mg via INTRAVENOUS

## 2021-03-03 MED ORDER — SODIUM CHLORIDE 0.9 % IV SOLN
500.0000 mg | INTRAVENOUS | Status: DC
  Administered 2021-03-05 – 2021-03-13 (×5): 500 mg via INTRAVENOUS
  Filled 2021-03-03 (×7): qty 10

## 2021-03-03 MED ORDER — ENSURE PRE-SURGERY PO LIQD
296.0000 mL | Freq: Once | ORAL | Status: DC
  Filled 2021-03-03: qty 296

## 2021-03-03 MED ORDER — SODIUM CHLORIDE 0.9 % IV SOLN
500.0000 mg | Freq: Every day | INTRAVENOUS | Status: DC
  Administered 2021-03-03: 500 mg via INTRAVENOUS
  Filled 2021-03-03: qty 10

## 2021-03-03 MED ORDER — SUGAMMADEX SODIUM 200 MG/2ML IV SOLN
INTRAVENOUS | Status: DC | PRN
  Administered 2021-03-03: 200 mg via INTRAVENOUS

## 2021-03-03 MED ORDER — ARTIFICIAL TEARS OPHTHALMIC OINT
TOPICAL_OINTMENT | OPHTHALMIC | Status: AC
  Filled 2021-03-03: qty 3.5

## 2021-03-03 MED ORDER — ONDANSETRON HCL 4 MG/2ML IJ SOLN: 4.0000 mg | Freq: Four times a day (QID) | INTRAMUSCULAR | Status: DC | PRN

## 2021-03-03 MED ORDER — PROPOFOL 10 MG/ML IV BOLUS
INTRAVENOUS | Status: DC | PRN
  Administered 2021-03-03: 50 mg via INTRAVENOUS

## 2021-03-03 MED ORDER — FENTANYL CITRATE (PF) 250 MCG/5ML IJ SOLN
INTRAMUSCULAR | Status: AC
  Filled 2021-03-03: qty 5

## 2021-03-03 MED ORDER — ENOXAPARIN SODIUM 30 MG/0.3ML ~~LOC~~ SOLN: 30.0000 mg | Freq: Two times a day (BID) | SUBCUTANEOUS | Status: DC

## 2021-03-03 MED ORDER — SODIUM CHLORIDE 0.9 % IR SOLN
Status: DC | PRN
  Administered 2021-03-03: 12000 mL

## 2021-03-03 MED ORDER — SODIUM CHLORIDE 0.9 % IV SOLN
6.0000 mg/kg | Freq: Every day | INTRAVENOUS | Status: DC
  Filled 2021-03-03: qty 12

## 2021-03-03 MED ORDER — METOCLOPRAMIDE HCL 5 MG/ML IJ SOLN
5.0000 mg | Freq: Three times a day (TID) | INTRAMUSCULAR | Status: DC | PRN
Start: 2021-03-03 — End: 2021-03-10

## 2021-03-03 MED ORDER — HYDROCODONE-ACETAMINOPHEN 7.5-325 MG PO TABS: 1.0000 | ORAL_TABLET | ORAL | Status: DC | PRN

## 2021-03-03 MED ORDER — DOCUSATE SODIUM 100 MG PO CAPS
100.0000 mg | ORAL_CAPSULE | Freq: Two times a day (BID) | ORAL | Status: DC
  Administered 2021-03-03 – 2021-03-09 (×6): 100 mg via ORAL
  Filled 2021-03-03 (×11): qty 1

## 2021-03-03 MED ORDER — CHLORHEXIDINE GLUCONATE 4 % EX LIQD: 60.0000 mL | Freq: Once | CUTANEOUS | Status: DC

## 2021-03-03 MED ORDER — ACETAMINOPHEN 500 MG PO TABS
ORAL_TABLET | ORAL | Status: AC
  Filled 2021-03-03: qty 2

## 2021-03-03 MED ORDER — VANCOMYCIN HCL IN DEXTROSE 1-5 GM/200ML-% IV SOLN
1000.0000 mg | INTRAVENOUS | Status: AC
  Administered 2021-03-03: 1000 mg via INTRAVENOUS
  Filled 2021-03-03: qty 200

## 2021-03-03 MED ORDER — ALUM & MAG HYDROXIDE-SIMETH 200-200-20 MG/5ML PO SUSP: 30.0000 mL | ORAL | Status: DC | PRN

## 2021-03-03 MED ORDER — PHENOL 1.4 % MT LIQD: 1.0000 | OROMUCOSAL | Status: DC | PRN

## 2021-03-03 MED ORDER — 0.9 % SODIUM CHLORIDE (POUR BTL) OPTIME
TOPICAL | Status: DC | PRN
  Administered 2021-03-03: 1000 mL

## 2021-03-03 MED ORDER — ONDANSETRON HCL 4 MG/2ML IJ SOLN
INTRAMUSCULAR | Status: AC
  Filled 2021-03-03: qty 2

## 2021-03-03 MED ORDER — DEXAMETHASONE SODIUM PHOSPHATE 10 MG/ML IJ SOLN
INTRAMUSCULAR | Status: DC | PRN
  Administered 2021-03-03: 10 mg via INTRAVENOUS

## 2021-03-03 MED ORDER — DEXTROSE IN LACTATED RINGERS 5 % IV SOLN: INTRAVENOUS | Status: AC

## 2021-03-03 MED ORDER — FENTANYL CITRATE (PF) 250 MCG/5ML IJ SOLN
INTRAMUSCULAR | Status: DC | PRN
  Administered 2021-03-03 (×2): 50 ug via INTRAVENOUS
  Administered 2021-03-03: 25 ug via INTRAVENOUS

## 2021-03-03 MED ORDER — CHLORHEXIDINE GLUCONATE 0.12 % MT SOLN
OROMUCOSAL | Status: AC
  Administered 2021-03-03: 15 mL
  Filled 2021-03-03: qty 15

## 2021-03-03 MED ORDER — ONDANSETRON HCL 4 MG/2ML IJ SOLN
INTRAMUSCULAR | Status: DC | PRN
  Administered 2021-03-03: 4 mg via INTRAVENOUS

## 2021-03-03 MED ORDER — POVIDONE-IODINE 10 % EX SWAB: 2.0000 "application " | Freq: Once | CUTANEOUS | Status: DC

## 2021-03-03 MED ORDER — LIDOCAINE 2% (20 MG/ML) 5 ML SYRINGE
INTRAMUSCULAR | Status: DC | PRN
  Administered 2021-03-03: 40 mg via INTRAVENOUS

## 2021-03-03 MED ORDER — PHENYLEPHRINE HCL-NACL 10-0.9 MG/250ML-% IV SOLN
INTRAVENOUS | Status: DC | PRN
  Administered 2021-03-03: 15 ug/min via INTRAVENOUS

## 2021-03-03 MED ORDER — ACETAMINOPHEN 325 MG PO TABS
325.0000 mg | ORAL_TABLET | Freq: Four times a day (QID) | ORAL | Status: DC | PRN
  Administered 2021-03-03 – 2021-03-09 (×4): 650 mg via ORAL
  Filled 2021-03-03 (×4): qty 2

## 2021-03-03 MED ORDER — ONDANSETRON HCL 4 MG PO TABS: 4.0000 mg | ORAL_TABLET | Freq: Four times a day (QID) | ORAL | Status: DC | PRN

## 2021-03-03 MED ORDER — TOBRAMYCIN SULFATE 1.2 G IJ SOLR
INTRAMUSCULAR | Status: DC | PRN
  Administered 2021-03-03: 1.2 g via TOPICAL

## 2021-03-03 MED ORDER — VANCOMYCIN HCL 500 MG IV SOLR
INTRAVENOUS | Status: DC | PRN
  Administered 2021-03-03: 500 mg via TOPICAL

## 2021-03-03 MED ORDER — DEXAMETHASONE SODIUM PHOSPHATE 10 MG/ML IJ SOLN
INTRAMUSCULAR | Status: AC
  Filled 2021-03-03: qty 1

## 2021-03-03 SURGICAL SUPPLY — 69 items
AXLE ORTHOPEDIC SALVAGE SYSTEM (Knees) ×2 IMPLANT
AXLE TIB LFRIC INTFC KN OSS (Knees) ×2 IMPLANT
BANDAGE ESMARK 6X9 LF (GAUZE/BANDAGES/DRESSINGS) ×2 IMPLANT
BEARING TIBIAL OSS ARCOM 12MM (Knees) ×1 IMPLANT
BIOPATCH RED 1 DISK 7.0 (GAUZE/BANDAGES/DRESSINGS) ×2 IMPLANT
BNDG CMPR 9X6 STRL LF SNTH (GAUZE/BANDAGES/DRESSINGS) ×2
BNDG CMPR MED 15X6 ELC VLCR LF (GAUZE/BANDAGES/DRESSINGS) ×2
BNDG COHESIVE 6X5 TAN STRL LF (GAUZE/BANDAGES/DRESSINGS) ×2 IMPLANT
BNDG ELASTIC 4X5.8 VLCR STR LF (GAUZE/BANDAGES/DRESSINGS) ×2 IMPLANT
BNDG ELASTIC 6X15 VLCR STRL LF (GAUZE/BANDAGES/DRESSINGS) ×2 IMPLANT
BNDG ESMARK 6X9 LF (GAUZE/BANDAGES/DRESSINGS) ×3
BNDG GAUZE ELAST 4 BULKY (GAUZE/BANDAGES/DRESSINGS) ×1 IMPLANT
BRNG TIB STD 12 STRL KN (Knees) ×2 IMPLANT
BUSHING TIBIAL OSS ARCOM (Knees) ×1 IMPLANT
CANISTER WOUNDNEG PRESSURE 500 (CANNISTER) ×2 IMPLANT
CUFF TOURN SGL QUICK 34 (TOURNIQUET CUFF) ×3
CUFF TRNQT CYL 34X4.125X (TOURNIQUET CUFF) ×1 IMPLANT
DRAIN CHANNEL 10M FLAT 3/4 FLT (DRAIN) ×2 IMPLANT
DRAPE ORTHO SPLIT ENLRG (DRAPES) ×2 IMPLANT
DRAPE UNIVERSAL PACK (DRAPES) ×2 IMPLANT
DRESSING PREVENA PLUS CUSTOM (GAUZE/BANDAGES/DRESSINGS) ×1 IMPLANT
DRSG PAD ABDOMINAL 8X10 ST (GAUZE/BANDAGES/DRESSINGS) ×1 IMPLANT
DRSG PREVENA PLUS CUSTOM (GAUZE/BANDAGES/DRESSINGS) ×3
DRSG TEGADERM 4X4.5 CHG (GAUZE/BANDAGES/DRESSINGS) ×2 IMPLANT
EVACUATOR 1/8 PVC DRAIN (DRAIN) ×2 IMPLANT
EVACUATOR SILICONE 100CC (DRAIN) ×2 IMPLANT
FEMORAL BUSHING OSS ARCOM SET (Knees) ×2 IMPLANT
GAUZE SPONGE 2X2 8PLY STRL LF (GAUZE/BANDAGES/DRESSINGS) ×1 IMPLANT
GAUZE SPONGE 4X4 12PLY STRL (GAUZE/BANDAGES/DRESSINGS) ×1 IMPLANT
GLOVE BIOGEL M 7.0 STRL (GLOVE) ×3 IMPLANT
GLOVE BIOGEL M STRL SZ7.5 (GLOVE) ×2 IMPLANT
GLOVE BIOGEL PI IND STRL 7.5 (GLOVE) ×4 IMPLANT
GLOVE BIOGEL PI IND STRL 8.5 (GLOVE) ×2 IMPLANT
GLOVE BIOGEL PI INDICATOR 7.5 (GLOVE) ×2
GLOVE BIOGEL PI INDICATOR 8.5 (GLOVE) ×1
GLOVE ECLIPSE 8.0 STRL XLNG CF (GLOVE) IMPLANT
GLOVE ORTHO TXT STRL SZ7.5 (GLOVE) ×6 IMPLANT
GLOVE SURG ORTHO 8.5 STRL (GLOVE) ×9 IMPLANT
GOWN STRL REUS W/ TWL LRG LVL3 (GOWN DISPOSABLE) ×2 IMPLANT
GOWN STRL REUS W/ TWL XL LVL3 (GOWN DISPOSABLE) ×6 IMPLANT
GOWN STRL REUS W/TWL LRG LVL3 (GOWN DISPOSABLE) ×3
GOWN STRL REUS W/TWL XL LVL3 (GOWN DISPOSABLE) ×9
HANDPIECE INTERPULSE COAX TIP (DISPOSABLE) ×3
HOOD PEEL AWAY FLYTE STAYCOOL (MISCELLANEOUS) ×4 IMPLANT
IMMOBILIZER KNEE 20 (SOFTGOODS) ×3
IMMOBILIZER KNEE 20 THIGH 36 (SOFTGOODS) ×1 IMPLANT
KIT BASIN OR (CUSTOM PROCEDURE TRAY) ×3 IMPLANT
KIT STIMULAN RAPID CURE 5CC (Orthopedic Implant) ×2 IMPLANT
MANIFOLD NEPTUNE II (INSTRUMENTS) ×3 IMPLANT
PACK ORTHO EXTREMITY (CUSTOM PROCEDURE TRAY) ×3 IMPLANT
PAD CAST 4YDX4 CTTN HI CHSV (CAST SUPPLIES) ×2 IMPLANT
PADDING CAST COTTON 4X4 STRL (CAST SUPPLIES) ×3
PADDING CAST COTTON 6X4 STRL (CAST SUPPLIES) ×2 IMPLANT
PIN LOCKING OSS ARCOM (Knees) ×2 IMPLANT
SET HNDPC FAN SPRY TIP SCT (DISPOSABLE) ×2 IMPLANT
SET PAD KNEE POSITIONER (MISCELLANEOUS) ×2 IMPLANT
SPONGE GAUZE 2X2 STER 10/PKG (GAUZE/BANDAGES/DRESSINGS) ×1
STAPLER VISISTAT 35W (STAPLE) ×2 IMPLANT
STOCKINETTE IMPERVIOUS LG (DRAPES) ×2 IMPLANT
SUT ETHILON 3 0 PS 1 (SUTURE) ×2 IMPLANT
SUT MON AB 2-0 CT1 36 (SUTURE) ×6 IMPLANT
SUT PDS AB 1 CTX 36 (SUTURE) ×4 IMPLANT
SUT VLOC 180 0 24IN GS25 (SUTURE) ×2 IMPLANT
SYR CONTROL 10ML LL (SYRINGE) ×3 IMPLANT
SYSTEM YOKE ORTHOPEDIC SALVAGE (Knees) ×1 IMPLANT
TIBIAL BEARING OSS ARCOM 12MM (Knees) ×3 IMPLANT
TIBIAL BUSHING OSS ARCOM (Knees) ×3 IMPLANT
TOWEL GREEN STERILE (TOWEL DISPOSABLE) ×6 IMPLANT
YOKE ORTHOPEDIC SALVAGE SYSTEM (Knees) ×3 IMPLANT

## 2021-03-03 NOTE — Consult Note (Signed)
ORTHOPAEDIC CONSULTATION  REQUESTING PHYSICIAN: George Hugh, MD  PCP:  Rosita Fire, MD  Chief Complaint: Periprosthetic joint infection left knee  HPI: [name] is a 87 y.o. female who underwent left distal femur replacement on 01/14/2021 for a nonreconstructable periprosthetic femur fracture.  She was recently admitted at an outside hospital and found to have lower leg cellulitis and elevated serum white blood cell count.  She has also subsequently developed two sacral decubitus ulcers as well as a right lateral ankle decubitus ulcer.  She was found to have an Enterococcus UTI.  She was then transferred to [facility].  Left knee was aspirated, showing 100,000 synovial white blood cells and Gram stain was positive for GPC's in pairs and chains.  She was then indicated for I&D of the left knee with polyexchange for source control.  She has received packed red blood cells this morning for acute blood loss anemia on chronic anemia.  Past Medical History:  Diagnosis Date  . Arthritis   . Gastritis, Helicobacter pylori    Treated with Pylera, 2010  . Hypertension   . Hypothyroidism    Past Surgical History:  Procedure Laterality Date  . ABDOMINAL HYSTERECTOMY    . CARPAL TUNNEL RELEASE    . CERVICAL FUSION    . COLON SURGERY  2010   Dr. Georgette Dover: secondary to cecal mass. Negative for malignancy  . COLONOSCOPY  May 2010   Dr. Oneida Alar: large cecal mass, referred to Dr. Georgette Dover  . ESOPHAGOGASTRODUODENOSCOPY  May 2010   normal esophagus, no Barrett's, moderate gastritis, +H.PYLORI  . JOINT REPLACEMENT     bilateral knee replacement, left shoulder surgery  . TOTAL KNEE REVISION Left 01/14/2021   Procedure: LEFT DISTAL FEMUR REPLACEMENT;  Surgeon: Rod Can, MD;  Location: [facility];  Service: Orthopedics;  Laterality: Left;   Social History   Socioeconomic History  . Marital status: Widowed    Spouse name: Not on file  . Number of children: Not on file  . Years of  education: Not on file  . Highest education level: Not on file  Occupational History  . Not on file  Tobacco Use  . Smoking status: Never Smoker  . Smokeless tobacco: Never Used  Substance and Sexual Activity  . Alcohol use: No  . Drug use: No  . Sexual activity: Never  Other Topics Concern  . Not on file  Social History Narrative   Widowed, 2 daughters    Lives in [place]   Denies EtOH, smoking    Social Determinants of Health   Financial Resource Strain: Not on file  Food Insecurity: Not on file  Transportation Needs: Not on file  Physical Activity: Not on file  Stress: Not on file  Social Connections: Not on file   Family History  Problem Relation Age of Onset  . Colon cancer Neg Hx    Allergies  Allergen Reactions  . Amoxicillin Other (See Comments)    Burning sensation.  Marland Kitchen Penicillins Other (See Comments)    Burning sensation.   Prior to Admission medications   Medication Sig Start Date End Date Taking? Authorizing Provider  acetaminophen (TYLENOL) 325 MG tablet Take 2 tablets (650 mg total) by mouth every 6 (six) hours as needed for mild pain (or Fever >/= 101). 01/17/21  Yes Geradine Girt, DO  ALPRAZolam (XANAX) 0.5 MG tablet Take 0.5 mg by mouth daily as needed. 02/14/21  Yes [provider]  cilostazol (PLETAL) 100  MG tablet Take 100 mg by mouth 2 (two) times daily.   Yes [provider]  felodipine (PLENDIL) 10 MG 24 hr tablet Take 10 mg by mouth daily.   Yes [provider]  furosemide (LASIX) 40 MG tablet Take 40 mg by mouth daily. 01/29/21  Yes [provider]  lansoprazole (PREVACID) 30 MG capsule Take 30 mg by mouth daily.   Yes [provider]  metoprolol tartrate (LOPRESSOR) 25 MG tablet Take 1 tablet (25 mg total) by mouth 2 (two) times daily. 01/17/21  Yes Eulogio Bear U, DO  potassium chloride SA (KLOR-CON) 20 MEQ tablet Take 20 mEq by mouth daily.   Yes [provider]  SYNTHROID 100  MCG tablet Take 100 mcg by mouth every morning. 01/29/21  Yes [provider]  docusate sodium (COLACE) 100 MG capsule Take 1 capsule (100 mg total) by mouth 2 (two) times daily. Patient not taking: Reported on 02/26/2021 01/17/21   Geradine Girt, DO  feeding supplement (ENSURE ENLIVE / ENSURE PLUS) LIQD Take 237 mLs by mouth 3 (three) times daily between meals. Patient not taking: Reported on 02/26/2021 01/17/21   Geradine Girt, DO  HYDROcodone-acetaminophen (NORCO/VICODIN) 5-325 MG tablet Take 1 tablet by mouth every 4 (four) hours as needed for moderate pain. Patient not taking: Reported on 02/26/2021 01/16/21   Cherlynn June B, PA  LORazepam (ATIVAN) 0.5 MG tablet Take 1 tablet (0.5 mg total) by mouth every 6 (six) hours as needed for anxiety. Patient not taking: Reported on 02/26/2021 01/17/21   Geradine Girt, DO  vitamin B-12 1000 MCG tablet Take 1 tablet (1,000 mcg total) by mouth daily. Patient not taking: Reported on 02/26/2021 01/17/21   Eulogio Bear U, DO   No results found.  Positive ROS: All other systems have been reviewed and were otherwise negative with the exception of those mentioned in the HPI and as above.  Physical Exam: General: Alert, no acute distress Cardiovascular: No pedal edema Respiratory: No cyanosis, no use of accessory musculature GI: No organomegaly, abdomen is soft and non-tender Skin: No lesions in the area of chief complaint Neurologic: Sensation intact distally Psychiatric: Confused Lymphatic: No axillary or cervical lymphadenopathy  MUSCULOSKELETAL: Examination of the left knee reveals that she has some swelling.  The incision is healed without any drainage.  She has extensive cellulitis to the lower leg.  Motor and sensory exam unable to be obtained due to the mental status.  Foot is well-perfused.  Assessment: Acute periprosthetic joint infection, left total knee replacement Anemia Dementia UTI Multiple decubitus ulcers  Plan: This is a  very unfortunate situation.  She has developed periprosthetic joint infection of her left knee revision arthroplasty, which is likely hematogenous from her multiple decubitus ulcers.  Recommend I&D of the left knee with polyliner exchange for source control.  She will need prolonged IV antibiotics, PICC line, and lifelong oral suppression.  Plan for surgery today.    Bertram Savin, MD [phone]    02/20/2021 8:55 AM

## 2021-03-03 NOTE — Progress Notes (Signed)
[id number] Pt daughter Corliss Skains was called for verbal consent for pt procedure this morning. Permission was granted and consent was verified and signed by 2 nurses, myself and Research scientist (life sciences).

## 2021-03-03 NOTE — Progress Notes (Addendum)
PROGRESS NOTE    Bernard Donahoo Bronder  QJF:[id number] DOB: 18-Feb-1929 DOA: 02/24/2021 PCP: Rosita Fire, MD   Chief Complaint  Patient presents with  . Weakness  Brief Narrative: 87 year old female with a PMH of Chronic Dementia, Peripheral Vascular Disease, and multiple medical problems presents to [facility] ED on 4/11 with confusion, weakness, mild AKI (Creatinine 2.53 with baseline ~ 1 mg/dL) and acute swelling of the left knee.  She is s/p 2/27 Left Femur Replacement performed by Dr. Lyla Glassing found to have a Left Knee Prosthetic Joint Infection.  On 4/14, patient was transferred to [facility].  4/14 - Left Knee Arthrocentesis performed by Ortho.  Cultures sent. 4/16 - I&D, Left Total Knee Arthroplasty with Radical Synovectomy performed by Ortho Dr. Lyla Glassing.  Intraoperative cultures sent.  4/14 BCx: NG 4/14 UCx: Enterococcus Avium  Subjective: Patient evaluated postoperatively. Appears ill. No fevers. Wounds look ok.  Assessment & Plan:  Left Knee Prosthetic Joint Infection s/p 4/16 Resection Arthroplasty and Radical Synovectomy: - 4/14 aspiration cultures shows GPCs in pairs. - Follow up on intraoperative aerobic and anaerobic culture, gram stain, and histopathology.  Patient likely has bone infection in stem or hardware interface thus joint fluid may return negative. - On POD1 of Daptomycin 6 mg/kg QD and Ceftriaxone 2g QD.  The goal is to treat infection and prevent relapse.  Patient will need at least 6 weeks of IV therapy followed by oral drug therapy based on susceptibilities plus Rifampin 300-450 mg PO BID. - Monitor LFTs and CK weekly while on Daptomycin. - Order PICC line placement. - Appreciate Orthopedics assistance and recommendations. - Appreciate Infectious Disease assistance and recommendations.  Left Lower Extremity Non-Purulent Cellulitis: - Check MRI of ankle to evaluate for osteomyelitis.  Stage 4 Sacral Ulcer, POA: - Check MRI of sacrum to evaluate for  osteomyelitis.  Enterococcus Avium UTI: 4/14 Urine cultures are resistant to Ampicillin and sensitive to Vancomycin. - On Day 6/7 of Vancomycin.  Continue antibiotics as above to complete 7 day course.  Acute Kidney Injury with mild metabolic acidosis: - Creatinine is 1.97 mg/dL with baseline ~ 1.1 mg/dL. - BC is 21 with baseline ~ 25 mmol/L. - Start D5LR at 150 CC/hr (SMART trial).  Repeat BMP in am.  Acute Toxic Metabolic Encephalopathy in the setting of Chronic Dementia: - Mental status should improve with fluids and antibiotics.  Peripheral Vascular Disease: - Continue Aspirin, Plavix, and Statin.  Paroxysmal Atrial Fibrillation: - Continue Metoprolol for rate control. - Hold anticoagulation due to high bleeding risk. - Monitor electrolytes. - Monitor on telemetry.  Grade 1 Diastolic Heart Failure: Last echo was on 4/11. - Euvolemic. - Monitor respiratory status while on fluids. - Echo showed small LV cavity with hyperdynamic function.  Continue beta blocker for afterload reduction.    Chronic Anemia - B12 and IDA: HH is stable. Essential Hypertension: BP is stable. Hold off on meds. GERD: PPI Hypothyroidism: Synthroid   Diet Order            Diet full liquid Room service appropriate? Yes; Fluid consistency: Thin  Diet effective now                 Nutrition Problem: Increased nutrient needs Etiology: wound healing (3 stage 4 PI and a DTI to left heel) Signs/Symptoms: estimated needs Interventions: Ensure Enlive (each supplement provides 350kcal and 20 grams of protein),MVI,Juven,Prostat Patient's Body mass index is 37.44 kg/m.  Pressure Injury 03/09/2021 Sacrum Mid Stage 4 - Full thickness tissue loss with exposed  bone, tendon or muscle. tunneling (Active)  03/17/2021 2105  Location: Sacrum  Location Orientation: Mid  Staging: Stage 4 - Full thickness tissue loss with exposed bone, tendon or muscle.  Wound Description (Comments): tunneling  Present on Admission:  Yes     Pressure Injury 02/20/2021 Sacrum Mid Stage 4 - Full thickness tissue loss with exposed bone, tendon or muscle. tunneling (Active)  02/18/2021 2106  Location: Sacrum  Location Orientation: Mid  Staging: Stage 4 - Full thickness tissue loss with exposed bone, tendon or muscle.  Wound Description (Comments): tunneling  Present on Admission: Yes     Pressure Injury 02/26/21 Ankle Right;Lateral Stage 4 - Full thickness tissue loss with exposed bone, tendon or muscle. (Active)  02/26/21 0202  Location: Ankle  Location Orientation: Right;Lateral  Staging: Stage 4 - Full thickness tissue loss with exposed bone, tendon or muscle.  Wound Description (Comments):   Present on Admission:      Pressure Injury 02/26/21 Foot Left;Lateral Deep Tissue Pressure Injury - Purple or maroon localized area of discolored intact skin or blood-filled blister due to damage of underlying soft tissue from pressure and/or shear. (Active)  02/26/21 0158  Location: Foot  Location Orientation: Left;Lateral  Staging: Deep Tissue Pressure Injury - Purple or maroon localized area of discolored intact skin or blood-filled blister due to damage of underlying soft tissue from pressure and/or shear.  Wound Description (Comments):   Present on Admission: Yes    DVT prophylaxis: enoxaparin (LOVENOX) injection 30 mg Start: 03/02/21 1000 SCDs Start: 02/26/21 0147 Code Status:   Code Status: Full Code  Family Communication: plan of care discussed with patient at bedside.  Status is: Inpatient Remains inpatient appropriate because:IV treatments appropriate due to intensity of illness or inability to take PO and Inpatient level of care appropriate due to severity of illness  Dispo: The patient is from: Home              Anticipated d/c is to: TBD              Patient currently is not medically stable to d/c.   Difficult to place patient No Unresulted Labs (From admission, onward)          Start     Ordered    03/04/21 1751  Basic metabolic panel  Daily,   R     Question:  Specimen collection method  Answer:  Lab=Lab collect   02/19/2021 1322   03/04/21 0500  CBC with Differential/Platelet  Daily,   R     Question:  Specimen collection method  Answer:  Lab=Lab collect   03/04/2021 1322   03/04/21 0500  CK  Tomorrow morning,   R       Question:  Specimen collection method  Answer:  Lab=Lab collect   03/02/2021 1322   02/27/2021 0500  Hepatitis panel, acute  Tomorrow morning,   R        03/02/21 1620   03/17/2021 0500  HIV Antibody (routine testing w rflx)  (HIV Antibody (Routine testing w reflex) panel)  Tomorrow morning,   R        03/02/21 1620   03/02/21 0500  Comprehensive metabolic panel  Daily,   R      03/01/21 0932   Signed and Held  CBC  Daily,   R     Question:  Specimen collection method  Answer:  Lab=Lab collect   Signed and Held   Signed and Held  Basic metabolic panel  Tomorrow morning,   R       Question:  Specimen collection method  Answer:  Lab=Lab collect   Signed and Held   Signed and Held  CBC  (enoxaparin (LOVENOX)  CrCl >/= 30 mL/min  )  Once,   R       Comments: Baseline for enoxaparin therapy IF NOT ALREADY DRAWN. Notify MD if PLT < 100 K.   Question:  Specimen collection method  Answer:  Lab=Lab collect   Signed and Held   Signed and Held  Creatinine, serum  (enoxaparin (LOVENOX)  CrCl >/= 30 mL/min  )  Once,   R       Comments: Baseline for enoxaparin therapy IF NOT ALREADY DRAWN.   Question:  Specimen collection method  Answer:  Lab=Lab collect   Signed and Held   Signed and Held  Creatinine, serum  (enoxaparin (LOVENOX)  CrCl >/= 30 mL/min  )  Weekly,   R     Comments: while on enoxaparin therapy.   Question:  Specimen collection method  Answer:  Lab=Lab collect   Signed and Held        Medications reviewed:  Scheduled Meds: . aspirin EC  81 mg Oral Q breakfast  . cefTRIAXone (ROCEPHIN) IM  2 g Intramuscular Q24H  . Chlorhexidine Gluconate Cloth  6 each Topical  Daily  . cilostazol  100 mg Oral BID  . enoxaparin (LOVENOX) injection  30 mg Subcutaneous Q24H  . feeding supplement  237 mL Oral TID BM  . ferrous sulfate  325 mg Oral Q breakfast  . levothyroxine  100 mcg Oral Q0600  . metoprolol tartrate  12.5 mg Oral BID  . multivitamin with minerals  1 tablet Oral Daily  . nutrition supplement (JUVEN)  1 packet Oral BID BM  . pantoprazole  40 mg Oral QAC breakfast  . senna-docusate  1 tablet Oral QHS  . vitamin B-12  1,000 mcg Oral Daily   Continuous Infusions: . dextrose 5% lactated ringers      Consultants:see note  Procedures:see note  Antimicrobials: Anti-infectives (From admission, onward)   Start     Dose/Rate Route Frequency Ordered Stop   03/16/2021 2000  DAPTOmycin (CUBICIN) 500 mg in sodium chloride 0.9 % IVPB  Status:  Discontinued        8 mg/kg  61.3 kg 120 mL/hr over 30 Minutes Intravenous Every 48 hours 03/02/21 0855 03/09/2021 1320   02/28/2021 1045  tobramycin (NEBCIN) powder  Status:  Discontinued          As needed 03/12/2021 1045 03/14/2021 1201   03/08/2021 1037  vancomycin (VANCOCIN) powder  Status:  Discontinued          As needed 03/05/2021 1044 02/20/2021 1201   02/20/2021 0800  vancomycin (VANCOCIN) IVPB 1000 mg/200 mL premix        1,000 mg 200 mL/hr over 60 Minutes Intravenous On call to O.R. 03/17/2021 0752 02/24/2021 1040   03/02/21 2200  cefTRIAXone (ROCEPHIN) injection 2 g        2 g Intramuscular Every 24 hours 03/02/21 0904     03/02/21 0600  ceFAZolin (ANCEF) IVPB 2g/100 mL premix        2 g 200 mL/hr over 30 Minutes Intravenous On call to O.R. 03/02/21 0110 03/07/2021 0559   02/27/21 2200  vancomycin (VANCOREADY) IVPB 750 mg/150 mL  Status:  Discontinued        750 mg 150 mL/hr over 60 Minutes Intravenous Every 48 hours 03/05/2021  2342 03/02/21 0855   03/01/2021 2315  vancomycin (VANCOCIN) IVPB 1000 mg/200 mL premix        1,000 mg 200 mL/hr over 60 Minutes Intravenous  Once 02/16/2021 2313 02/26/21 0115   02/19/2021 2315   ceFEPIme (MAXIPIME) 2 g in sodium chloride 0.9 % 100 mL IVPB  Status:  Discontinued        2 g 200 mL/hr over 30 Minutes Intravenous Every 24 hours 03/02/2021 2313 03/02/21 0904     Culture/Microbiology    Component Value Date/Time   SDES FLUID 03/01/2021 1102   SPECREQUEST SYNOVIAL LEFT KNEE 03/01/2021 1102   CULT  03/01/2021 1102    NO GROWTH 2 DAYS Performed at [facility], [place] [address], [place], [place] [address]    REPTSTATUS PENDING 03/01/2021 1102    Other culture-see note  Objective: Vitals: Today's Vitals   03/12/2021 1230 02/19/2021 1240 03/04/2021 1250 03/15/2021 1307  BP: 131/78 115/77 119/71 117/74  Pulse: 93 (!) 101 94 97  Resp: 19 18 19 18   Temp:  98.1 F (36.7 C)  (!) 97.5 F (36.4 C)  TempSrc:      SpO2: 99% 100% 99% 100%  Weight:      Height:      PainSc:  0-No pain 0-No pain     Intake/Output Summary (Last 24 hours) at 03/02/2021 1333 Last data filed at 03/09/2021 1205 Gross per 24 hour  Intake 1459 ml  Output 950 ml  Net 509 ml   Filed Weights   02/26/21 0148 02/28/21 0500 03/02/21 1024  Weight: 61.2 kg 61.3 kg 99 kg   Weight change:   Intake/Output from previous day: 04/15 0701 - 04/16 0700 In: -  Out: 800 [Urine:800] Intake/Output this shift: Total I/O In: 1459 [I.V.:750; Blood:559; Other:150] Out: 950 [Urine:350; Other:500; Blood:100] Filed Weights   02/26/21 0148 02/28/21 0500 03/02/21 1024  Weight: 61.2 kg 61.3 kg 99 kg    Examination: General exam: AAOx1-2, ill looking, elderly, frail and weak. HEENT:Oral mucosa moist, Ear/Nose WNL grossly, dentition normal. Respiratory system: bilaterally diminished,no wheezing or crackles,no use of accessory muscle Cardiovascular system: S1 & S2 +, No JVD, no significant murmurs Gastrointestinal system: Abdomen soft, NT,ND, BS+ Nervous System:Alert, awake, moving extremities and grossly nonfocal Extremities: No edema, distal peripheral pulses palpable.  Left knee area is tender left lower  extremity swollen Skin: sacral lesions noted MSK: physical deconditioning  Data Reviewed: I have personally reviewed following labs and imaging studies CBC: Recent Labs  Lab 02/26/2021 2005 02/26/21 0534 02/28/21 0511 03/01/21 0211 03/02/21 0151 03/13/2021 0103  WBC 17.2* 15.1* 18.7* 16.6* 14.6* 13.6*  NEUTROABS 15.1*  --   --   --   --   --   HGB 10.2* 9.5* 8.5* 8.7* 7.5* 7.4*  HCT 31.9* 30.3* 26.6* 26.9* 23.3* 22.8*  MCV 94.7 95.9 95.0 93.4 93.2 93.4  PLT 375 327 290 317 270 786   Basic Metabolic Panel: Recent Labs  Lab 02/26/21 0534 02/28/21 0511 03/01/21 0211 03/02/21 0151 03/01/2021 0103  NA 134* 133* 133* 134* 140  K 3.8 3.4* 4.6 4.5 4.3  CL 100 102 106 110 112*  CO2 22 22 23  20* 21*  GLUCOSE 78 86 104* 78 88  BUN 70* 68* 68* 75* 75*  CREATININE 2.17* 2.04* 2.07* 1.98* 1.97*  CALCIUM 8.2* 8.0* 8.1* 8.0* 8.2*  MG 2.0 1.9  --   --   --   PHOS 4.3  --  2.9  --   --  GFR: Estimated Creatinine Clearance: 22.1 mL/min (A) (by C-G formula based on SCr of 1.97 mg/dL (H)). Liver Function Tests: Recent Labs  Lab 03/11/2021 2005 02/26/21 0534 03/01/21 0211 03/02/21 0151 03/13/2021 0103  AST 14* 13*  --  19 24  ALT 9 8  --  11 13  ALKPHOS 80 69  --  59 56  BILITOT 0.7 0.7  --  0.7 0.4  PROT 7.1 6.4*  --  5.7* 5.8*  ALBUMIN 2.3* 2.0* 1.6* 1.4* 1.4*   Coagulation Profile: Recent Labs  Lab 03/09/2021 2005 02/26/21 0534  INR 1.2 1.3*   Cardiac Enzymes: Recent Labs  Lab 02/19/2021 0103  CKTOTAL 69   CBG: Recent Labs  Lab 02/16/2021 1939  GLUCAP 84   Sepsis Labs: Recent Labs  Lab 03/09/2021 2005 03/04/2021 2302  LATICACIDVEN 1.4 0.9    Recent Results (from the past 240 hour(s))  Blood culture (routine single)     Status: None   Collection Time: 02/24/2021  8:57 PM   Specimen: Right Antecubital; Blood  Result Value Ref Range Status   Specimen Description RIGHT ANTECUBITAL  Final   Special Requests   Final    BOTTLES DRAWN AEROBIC AND ANAEROBIC Blood Culture  adequate volume   Culture   Final    NO GROWTH 5 DAYS Performed at [facility], [address] [place], [place] [address]    Report Status 03/02/2021 FINAL  Final  Resp Panel by RT-PCR (Flu A&B, Covid) Nasopharyngeal Swab     Status: None   Collection Time: 02/24/2021  9:00 PM   Specimen: Nasopharyngeal Swab; Nasopharyngeal(NP) swabs in vial transport medium  Result Value Ref Range Status   SARS Coronavirus 2 by RT PCR NEGATIVE NEGATIVE Final    Comment: (NOTE) SARS-CoV-2 target nucleic acids are NOT DETECTED.  The SARS-CoV-2 RNA is generally detectable in upper respiratory specimens during the acute phase of infection. The lowest concentration of SARS-CoV-2 viral copies this assay can detect is 138 copies/mL. A negative result does not preclude SARS-Cov-2 infection and should not be used as the sole basis for treatment or other patient management decisions. A negative result may occur with  improper specimen collection/handling, submission of specimen other than nasopharyngeal swab, presence of viral mutation(s) within the areas targeted by this assay, and inadequate number of viral copies(<138 copies/mL). A negative result must be combined with clinical observations, patient history, and epidemiological information. The expected result is Negative.  Fact Sheet for Patients:  EntrepreneurPulse.com.au  Fact Sheet for Healthcare Providers:  IncredibleEmployment.be  This test is no t yet approved or cleared by the [place] FDA and  has been authorized for detection and/or diagnosis of SARS-CoV-2 by FDA under an Emergency Use Authorization (EUA). This EUA will remain  in effect (meaning this test can be used) for the duration of the COVID-19 declaration under Section 564(b)(1) of the Act, 21 U.S.C.section 360bbb-3(b)(1), unless the authorization is terminated  or revoked sooner.       Influenza A by PCR NEGATIVE NEGATIVE Final    Influenza B by PCR NEGATIVE NEGATIVE Final    Comment: (NOTE) The Xpert Xpress SARS-CoV-2/FLU/RSV plus assay is intended as an aid in the diagnosis of influenza from Nasopharyngeal swab specimens and should not be used as a sole basis for treatment. Nasal washings and aspirates are unacceptable for Xpert Xpress SARS-CoV-2/FLU/RSV testing.  Fact Sheet for Patients: EntrepreneurPulse.com.au  Fact Sheet for Healthcare Providers: IncredibleEmployment.be  This test is not yet approved or cleared by the [place]  States FDA and has been authorized for detection and/or diagnosis of SARS-CoV-2 by FDA under an Emergency Use Authorization (EUA). This EUA will remain in effect (meaning this test can be used) for the duration of the COVID-19 declaration under Section 564(b)(1) of the Act, 21 U.S.C. section 360bbb-3(b)(1), unless the authorization is terminated or revoked.  Performed at [facility], [address], [place], [place] [address]   Urine culture     Status: Abnormal   Collection Time: 02/17/2021 11:57 PM   Specimen: In/Out Cath Urine  Result Value Ref Range Status   Specimen Description   Final    IN/OUT CATH URINE Performed at [facility], [address], [place], [place] [address]    Special Requests   Final    NONE Performed at [facility], [address] [place], [place] [address]    Culture 80,000 COLONIES/mL ENTEROCOCCUS AVIUM (A)  Final   Report Status 03/01/2021 FINAL  Final   Organism ID, Bacteria ENTEROCOCCUS AVIUM (A)  Final      Susceptibility   Enterococcus avium - MIC*    AMPICILLIN 16 RESISTANT Resistant     NITROFURANTOIN <=16 SENSITIVE Sensitive     VANCOMYCIN <=0.5 SENSITIVE Sensitive     * 80,000 COLONIES/mL ENTEROCOCCUS AVIUM  Body fluid culture w Gram Stain     Status: None (Preliminary result)   Collection Time: 03/01/21 11:02 AM   Specimen: Body Fluid  Result Value Ref Range Status   Specimen Description FLUID   Final   Special Requests SYNOVIAL LEFT KNEE  Final   Gram Stain   Final    ABUNDANT WBC PRESENT, PREDOMINANTLY MONONUCLEAR ABUNDANT GRAM POSITIVE COCCI IN PAIRS IN CHAINS    Culture   Final    NO GROWTH 2 DAYS Performed at [facility], [place] [address], [place], [place] [address]    Report Status PENDING  Incomplete     Radiology Studies: No results found.   LOS: 6 days   George Hugh, MD [facility]  03/16/2021, 1:33 PM

## 2021-03-03 NOTE — Anesthesia Postprocedure Evaluation (Signed)
Anesthesia Post Note  Patient: [name]  Procedure(s) Performed: IRRIGATION AND DEBRIDEMENT KNEE WITH POLY EXCHANGE (Left Knee)     Patient location during evaluation: PACU Anesthesia Type: General Level of consciousness: awake and patient cooperative Pain management: pain level controlled Vital Signs Assessment: post-procedure vital signs reviewed and stable Respiratory status: spontaneous breathing, nonlabored ventilation, respiratory function stable and patient connected to nasal cannula oxygen Cardiovascular status: blood pressure returned to baseline and stable Postop Assessment: no apparent nausea or vomiting Anesthetic complications: no   No complications documented.  Last Vitals:  Vitals:   03/04/2021 1240 02/28/2021 1250  BP: 115/77 119/71  Pulse: (!) 101 94  Resp: 18 19  Temp: 36.7 C   SpO2: 100% 99%    Last Pain:  Vitals:   03/04/2021 1250  TempSrc:   PainSc: 0-No pain                 Dequavious Harshberger,W. EDMOND

## 2021-03-03 NOTE — OR Nursing (Signed)
PATIENT ARRIVED IN O.R. #6 WITH INTACT INDWELLING FOLEY CATHETER DRAINING CLOUDY YELLOW URINE. PATIENT FOUND INCONTINENT MODERATE AMOUNT SOFT BROWN STOOL; PERICARE GIVEN. SURGEON AWARE.

## 2021-03-03 NOTE — Progress Notes (Addendum)
[facility] Infectious Diseases Follow Up Note  Patient Identification: Patient Name: [name] MRN: [id number] [place] Date: 02/28/2021  7:26 PM Age: 87 y.o.Today's Date: 03/13/2021   Reason for Visit: Follow up on PJI   Principal Problem:   Severe Sepsis due to left leg cellulitis and possible Lt septic knee and Enterococcus UTI Active Problems:   Essential hypertension   PERIPHERAL VASCULAR DISEASE   GERD   Hypothyroidism   AKI (acute kidney injury) ([place])   Pressure injury of skin   Generalized weakness   Acute metabolic encephalopathy   Leukocytosis   Hyponatremia   Dehydration   Hypoalbuminemia   Elevated brain natriuretic peptide (BNP) level   Paroxysmal A-fib (HCC)   Altered mental status   Septic infrapatellar bursitis of left knee   Infection of total knee replacement (HCC)   Antibiotics:   ceftriaxone 4/16 -                      daptomycin 4/16 -  Lines/Tubes: PIV, RT PICC   Interval Events: Remains afebrile, leukocytosis up to 21 post OR yesterday.  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  Cultures no growth in 3 days.  Gram stain with abundant gram-positive cocci in pairs and chains  Status post I&D of left total knee arthroplasty with radical synovectomy and application of negative pressure incisional dressing by Ortho on 4/16  Enterococcus avium and urine cultures 03/13/2021.  Resistant to ampicillin  AKI - Nephrology consulted. Fu UA, vancomycin levels and renal US  Recommendations Continue Daptomycin and ceftriaxone, CPK 97 PICC line placed Fu synovial fluid cultures 4/14   Fu MRI RT ankle and sacrum ( ordered yesterday) Monitor cbc, bmp on iv  antibiotics   Dr Tommy Medal will be back tomorrow.   Rest of the management as per the primary team. Thank you for the consult. Please page with pertinent questions or concerns.  ______________________________________________________________________ Subjective patient seen and examined at the bedside. Patient is lying in bed. Denies fevers, chills and sweats. Denies nausea, vomiting, abdominal pain and diarrhea  Vitals BP (!) 92/58 (BP Location: Right Arm)   Pulse 90   Temp 98.2 F (36.8 C) (Oral)   Resp 20   Ht 5' 4.02" (1.626 m)   Wt 99 kg   SpO2 100%   BMI 37.44 kg/m     Physical Exam Constitutional:  lyin in the bed, not in acute distress     Comments:   Cardiovascular:     Rate and Rhythm: Normal rate and regular rhythm.     Heart sounds: No murmur heard.   Pulmonary:     Effort: Pulmonary effort is normal.     Comments:   Abdominal:     Palpations: Abdomen is soft.     Tenderness: non tender, BS+  Musculoskeletal:        General: Left knee is bandaged, Rt lateral ankle ulcer  Skin:    Comments: no obvious lesions, rashes   Neurological:     General: Grossly non focal   Psychiatric:        Mood and Affect: Mood normal.   Pertinent Microbiology Results for orders placed or performed during the hospital encounter of 03/17/2021  Blood culture (routine single)  Status: None   Collection Time: 02/19/2021  8:57 PM   Specimen: Right Antecubital; Blood  Result Value Ref Range Status   Specimen Description RIGHT ANTECUBITAL  Final   Special Requests   Final    BOTTLES DRAWN AEROBIC AND ANAEROBIC Blood Culture adequate volume   Culture   Final    NO GROWTH 5 DAYS Performed at [facility], [address] [place], [place] [address]    Report Status 03/02/2021 FINAL  Final  Resp Panel by RT-PCR (Flu A&B, Covid) Nasopharyngeal Swab     Status: None   Collection Time: 02/24/2021  9:00 PM   Specimen: Nasopharyngeal Swab; Nasopharyngeal(NP) swabs in vial transport  medium  Result Value Ref Range Status   SARS Coronavirus 2 by RT PCR NEGATIVE NEGATIVE Final    Comment: (NOTE) SARS-CoV-2 target nucleic acids are NOT DETECTED.  The SARS-CoV-2 RNA is generally detectable in upper respiratory specimens during the acute phase of infection. The lowest concentration of SARS-CoV-2 viral copies this assay can detect is 138 copies/mL. A negative result does not preclude SARS-Cov-2 infection and should not be used as the sole basis for treatment or other patient management decisions. A negative result may occur with  improper specimen collection/handling, submission of specimen other than nasopharyngeal swab, presence of viral mutation(s) within the areas targeted by this assay, and inadequate number of viral copies(<138 copies/mL). A negative result must be combined with clinical observations, patient history, and epidemiological information. The expected result is Negative.  Fact Sheet for Patients:  EntrepreneurPulse.com.au  Fact Sheet for Healthcare Providers:  IncredibleEmployment.be  This test is no t yet approved or cleared by the [place] FDA and  has been authorized for detection and/or diagnosis of SARS-CoV-2 by FDA under an Emergency Use Authorization (EUA). This EUA will remain  in effect (meaning this test can be used) for the duration of the COVID-19 declaration under Section 564(b)(1) of the Act, 21 U.S.C.section 360bbb-3(b)(1), unless the authorization is terminated  or revoked sooner.       Influenza A by PCR NEGATIVE NEGATIVE Final   Influenza B by PCR NEGATIVE NEGATIVE Final    Comment: (NOTE) The Xpert Xpress SARS-CoV-2/FLU/RSV plus assay is intended as an aid in the diagnosis of influenza from Nasopharyngeal swab specimens and should not be used as a sole basis for treatment. Nasal washings and aspirates are unacceptable for Xpert Xpress SARS-CoV-2/FLU/RSV testing.  Fact Sheet for  Patients: EntrepreneurPulse.com.au  Fact Sheet for Healthcare Providers: IncredibleEmployment.be  This test is not yet approved or cleared by the [place] FDA and has been authorized for detection and/or diagnosis of SARS-CoV-2 by FDA under an Emergency Use Authorization (EUA). This EUA will remain in effect (meaning this test can be used) for the duration of the COVID-19 declaration under Section 564(b)(1) of the Act, 21 U.S.C. section 360bbb-3(b)(1), unless the authorization is terminated or revoked.  Performed at [facility], [address], [place], [place] [address]   Urine culture     Status: Abnormal   Collection Time: 02/23/2021 11:57 PM   Specimen: In/Out Cath Urine  Result Value Ref Range Status   Specimen Description   Final    IN/OUT CATH URINE Performed at [facility], [address] [place], [place] [address]    Special Requests   Final    NONE Performed at [facility], [address] [place], [place] [address]    Culture 80,000 COLONIES/mL ENTEROCOCCUS AVIUM (A)  Final   Report Status 03/01/2021 FINAL  Final   Organism ID, Bacteria ENTEROCOCCUS AVIUM (A)  Final      Susceptibility   Enterococcus avium - MIC*    AMPICILLIN 16 RESISTANT Resistant     NITROFURANTOIN <=16 SENSITIVE Sensitive     VANCOMYCIN <=0.5 SENSITIVE Sensitive     * 80,000 COLONIES/mL ENTEROCOCCUS AVIUM  Body fluid culture w Gram Stain     Status: None (Preliminary result)   Collection Time: 03/01/21 11:02 AM   Specimen: Body Fluid  Result Value Ref Range Status   Specimen Description FLUID  Final   Special Requests SYNOVIAL LEFT KNEE  Final   Gram Stain   Final    ABUNDANT WBC PRESENT, PREDOMINANTLY MONONUCLEAR ABUNDANT GRAM POSITIVE COCCI IN PAIRS IN CHAINS    Culture   Final    NO GROWTH 3 DAYS Performed at [facility], [place] [address] [place], [place] [address]    Report Status PENDING  Incomplete    Pertinent Lab. CBC Latest Ref  Rng & Units 03/04/2021 02/26/2021 03/02/2021  WBC 4.0 - 10.5 K/uL 21.4(H) 13.6(H) 14.6(H)  Hemoglobin 12.0 - 15.0 g/dL 7.3(L) 7.4(L) 7.5(L)  Hematocrit 36.0 - 46.0 % 22.4(L) 22.8(L) 23.3(L)  Platelets 150 - 400 K/uL 220 255 270   CMP Latest Ref Rng & Units 03/04/2021 03/04/2021 02/22/2021  Glucose 70 - 99 mg/dL 124(H) 134(H) 88  BUN 8 - 23 mg/dL 82(H) 83(H) 75(H)  Creatinine 0.44 - 1.00 mg/dL 2.48(H) 2.29(H) 1.97(H)  Sodium 135 - 145 mmol/L 142 140 140  Potassium 3.5 - 5.1 mmol/L 4.6 5.2(H) 4.3  Chloride 98 - 111 mmol/L 115(H) 116(H) 112(H)  CO2 22 - 32 mmol/L 20(L) 17(L) 21(L)  Calcium 8.9 - 10.3 mg/dL 8.2(L) 8.0(L) 8.2(L)  Total Protein 6.5 - 8.1 g/dL - 5.0(L) 5.8(L)  Total Bilirubin 0.3 - 1.2 mg/dL - 0.8 0.4  Alkaline Phos 38 - 126 U/L - 48 56  AST 15 - 41 U/L - 32 24  ALT 0 - 44 U/L - 9 13     Pertinent Imaging today Plain films and CT images have been personally visualized and interpreted; radiology reports have been reviewed. Decision making incorporated into the Impression / Recommendations.  I have spent approx 30 minutes for this patient encounter including review of prior medical records, coordination of care  with greater than 50% of time being face to face/counseling and discussing diagnostics/treatment plan with the patient/family.  Electronically signed by:   Rosiland Oz, MD Infectious Disease Physician [facility] for Infectious Disease Pager: [phone]

## 2021-03-03 NOTE — Plan of Care (Signed)

## 2021-03-03 NOTE — Anesthesia Procedure Notes (Signed)
Procedure Name: Intubation Date/Time: 03/01/2021 9:18 AM Performed by: Kyung Rudd, CRNA Pre-anesthesia Checklist: Patient identified, Emergency Drugs available, Suction available and Patient being monitored Patient Re-evaluated:Patient Re-evaluated prior to induction Oxygen Delivery Method: Circle system utilized Preoxygenation: Pre-oxygenation with 100% oxygen Induction Type: IV induction Ventilation: Mask ventilation without difficulty Laryngoscope Size: Mac and 3 Grade View: Grade I Tube type: Oral Tube size: 7.0 mm Number of attempts: 1 Airway Equipment and Method: Stylet Placement Confirmation: ETT inserted through vocal cords under direct vision,  positive ETCO2 and breath sounds checked- equal and bilateral Secured at: 21 cm Tube secured with: Tape Dental Injury: Teeth and Oropharynx as per pre-operative assessment

## 2021-03-03 NOTE — Op Note (Addendum)
OPERATIVE REPORT   02/28/2021  12:00 PM  PATIENT:  [name]   SURGEON:  Bertram Savin, MD  ASSISTANT: Cherlynn June, PA-C.   PREOPERATIVE DIAGNOSIS: Periprosthetic joint infection, revision left total knee arthroplasty.  POSTOPERATIVE DIAGNOSIS:  Same.  PROCEDURE:  1.  Irrigation and debridement left total knee arthroplasty with radical synovectomy. 2.  Application of negative pressure incisional dressing.  ANESTHESIA:   GETA.  ANTIBIOTICS: 1 g vancomycin.  EXPLANTS: 12 mm ArCom tibial bearing with yoke, axle, femoral and tibial bushings, tibial bearing, and locking pin.  IMPLANTS: 12 mm ArCom tibial bearing with yoke, axle, femoral and tibial bushings, tibial bearing, and locking pin.  SPECIMENS: Left knee synovial fluid aspiration previously obtained.  DRAINS: 1.  Medium Hemovac in the left knee joint. 2.  10 mm flat JP drain in subcutaneous tissue. 3.  Prevena negative pressure incisional dressing.  COMPLICATIONS: None.  DISPOSITION: Stable to PACU.  SURGICAL INDICATIONS:  [name] is a 87 y.o. female who underwent left distal femur replacement on 01/14/2021 for a nonreconstructable periprosthetic femur fracture.  She was subsequently readmitted with lower extremity cellulitis and urinary tract infection.  Aspiration of the left knee was performed revealing 100,000 synovial white blood cells.  Gram stain was positive for GPC's in pairs and chains.  She was started on IV antibiotics, and an infectious disease consultation was obtained.  She was therefore indicated for irrigation and debridement of the left knee with polyexchange as a method of source control.  The risks, benefits, and alternatives were discussed with the patient /family preoperatively including but not limited to the risks of infection, bleeding, nerve / blood vessel injury, cardiopulmonary complications, the need for repeat surgery, among others, and the patient / family were willing to  proceed.  PROCEDURE IN DETAIL: The patient was identified in the holding area using 2 identifiers.  The surgical site was marked by myself.  She was taken to the operating room, and placed supine on the operating room table.  General anesthesia was induced.  Foley catheter was already in place.  All bony prominences were well-padded.  Nonsterile tourniquet is applied to the left upper thigh.  The left knee was prepped and draped in the normal sterile surgical fashion.  Timeout was called, verifying site and site of surgery.  Gravity was used to exsanguinate the lower extremity, the tourniquet was elevated to 250 mmHg.  The previous anterior incision was excised with a #10 blade.  Upon making the incision, there was a large subcutaneous seropurulent fluid collection.  Full-thickness skin flaps were created.  There was a rent in the fascial repair inferior to the patella.  Standard medial parapatellar arthrotomy was created.  There is approximately 500 cc of seropurulent fluid surrounding the implant.  The yoke, bushings, poly were all removed.  Excisional debridement was performed of all scar tissue rind.  Radical synovectomy was performed of the suprapatellar pouch, medial gutter, lateral gutter, and posterior compartment of the knee.  The knee was then copiously irrigated with 12 L of normal saline using pulse lavage.  The implants were mechanically debrided with a lap sponge.  The implants were stable.  Once I was satisfied with the debridement, all surgical personnel change their gloves and new drapes were added to the field.  The new yoke, axle, femoral bushings, tibial bearing, and locking pin were inserted.  On the back field, stimulan beads were mixed with 1.2 g tobramycin and 500 mg of vancomycin.  The beads  were then added around the implant.  The arthrotomy was closed over a medium Hemovac drain with #1 PDS and 0 V-Loc.  Through a separate stab incision in line with the wound proximally, a 10 mm flat  JP drain was placed into the subcutaneous tissues over the proximal tibia.  Deep dermal layer was then closed with 2-0 Monocryl interrupted sutures.  Skin was closed with staples.  Negative pressure Prevena incisional VAC dressing was applied according to manufacturer's instructions.  Suction was applied at 125 mmHg with excellent seal.  Bulky dressing was applied with sterile cast padding and Ace wrap.  The tourniquet was let down.  Knee immobilizer was applied.  Patient was then extubated, taken to the PACU in stable condition.  Sponge, needle, and instrument counts were correct at the end of the case x2.  There were no known complications.  Debridement type: Excisional Debridement  Side: left  Body Location: Left knee   Tools used for debridement: scalpel, scissors, curette and rongeur  Pre-debridement Wound size (cm):   Length: n/a        Width: n/a     Depth: n/a   Post-debridement Wound size (cm):   Length: n/a        Width: n/a     Depth: n/a   Debridement depth beyond dead/damaged tissue down to healthy viable tissue: yes  Tissue layer involved: skin, subcutaneous tissue, muscle / fascia  Nature of tissue removed: Non-viable tissue and Purulence  Irrigation volume: 12 L     Irrigation fluid type: Normal Saline    POSTOPERATIVE PLAN: Postoperatively, the patient be readmitted to the hospitalist service.  She may weight-bear as tolerated in the knee immobilizer.  Wear knee immobilizer at all times except for daily skin check.  JP drain will likely be retained for a prolonged amount of time.  We will plan to discontinue the Hemovac when there is less than 30 cc of output per shift.  Infectious disease recommendations for IV antibiotic selection and duration.  Patient will need lifelong oral suppression due to malnutrition/immunocompromised state.

## 2021-03-03 NOTE — Progress Notes (Signed)
Pt is connected to wound VAC. RN called to see if she could be disconnected. There is no clamp on the tubing in order to disconnect. Wound VAC not MR safe and unable to go into scanner. MRI will have to be done once pt can be disconnected from device. RN aware.

## 2021-03-03 NOTE — Transfer of Care (Signed)
Immediate Anesthesia Transfer of Care Note  Patient: [name]  Procedure(s) Performed: IRRIGATION AND DEBRIDEMENT KNEE WITH POLY EXCHANGE (Left Knee)  Patient Location: PACU  Anesthesia Type:General  Level of Consciousness: drowsy  Airway & Oxygen Therapy: Patient Spontanous Breathing and Patient connected to nasal cannula oxygen  Post-op Assessment: Report given to RN and Post -op Vital signs reviewed and stable  Post vital signs: Reviewed and stable  Last Vitals:  Vitals Value Taken Time  BP 137/78 02/22/2021 1209  Temp    Pulse 93 02/23/2021 1212  Resp 20 03/15/2021 1212  SpO2 100 % 03/16/2021 1212  Vitals shown include unvalidated device data.  Last Pain:  Vitals:   03/17/2021 0518  TempSrc: Oral  PainSc:       Patients Stated Pain Goal: 0 (29/93/71 6967)  Complications: No complications documented.

## 2021-03-04 ENCOUNTER — Inpatient Hospital Stay: Payer: Self-pay

## 2021-03-04 ENCOUNTER — Inpatient Hospital Stay (HOSPITAL_COMMUNITY): Payer: Medicare Other

## 2021-03-04 DIAGNOSIS — L03116 Cellulitis of left lower limb: Secondary | ICD-10-CM

## 2021-03-04 DIAGNOSIS — Z5181 Encounter for therapeutic drug level monitoring: Secondary | ICD-10-CM

## 2021-03-04 DIAGNOSIS — B952 Enterococcus as the cause of diseases classified elsewhere: Secondary | ICD-10-CM

## 2021-03-04 DIAGNOSIS — G9341 Metabolic encephalopathy: Secondary | ICD-10-CM | POA: Diagnosis not present

## 2021-03-04 DIAGNOSIS — N179 Acute kidney failure, unspecified: Secondary | ICD-10-CM | POA: Diagnosis not present

## 2021-03-04 DIAGNOSIS — R6520 Severe sepsis without septic shock: Secondary | ICD-10-CM

## 2021-03-04 DIAGNOSIS — T8459XA Infection and inflammatory reaction due to other internal joint prosthesis, initial encounter: Secondary | ICD-10-CM | POA: Diagnosis not present

## 2021-03-04 DIAGNOSIS — A419 Sepsis, unspecified organism: Secondary | ICD-10-CM | POA: Diagnosis not present

## 2021-03-04 LAB — CBC WITH DIFFERENTIAL/PLATELET
Abs Immature Granulocytes: 0.94 10*3/uL — ABNORMAL HIGH (ref 0.00–0.07)
Basophils Absolute: 0 10*3/uL (ref 0.0–0.1)
Basophils Relative: 0 %
Eosinophils Absolute: 0 10*3/uL (ref 0.0–0.5)
Eosinophils Relative: 0 %
HCT: 22.4 % — ABNORMAL LOW (ref 36.0–46.0)
Hemoglobin: 7.3 g/dL — ABNORMAL LOW (ref 12.0–15.0)
Immature Granulocytes: 4 %
Lymphocytes Relative: 6 %
Lymphs Abs: 1.3 10*3/uL (ref 0.7–4.0)
MCH: 29.4 pg (ref 26.0–34.0)
MCHC: 32.6 g/dL (ref 30.0–36.0)
MCV: 90.3 fL (ref 80.0–100.0)
Monocytes Absolute: 1 10*3/uL (ref 0.1–1.0)
Monocytes Relative: 5 %
Neutro Abs: 18.1 10*3/uL — ABNORMAL HIGH (ref 1.7–7.7)
Neutrophils Relative %: 85 %
Platelets: 220 10*3/uL (ref 150–400)
RBC: 2.48 MIL/uL — ABNORMAL LOW (ref 3.87–5.11)
RDW: 17.5 % — ABNORMAL HIGH (ref 11.5–15.5)
WBC: 21.4 10*3/uL — ABNORMAL HIGH (ref 4.0–10.5)
nRBC: 0.1 % (ref 0.0–0.2)

## 2021-03-04 LAB — URINALYSIS, ROUTINE W REFLEX MICROSCOPIC
Bilirubin Urine: NEGATIVE
Glucose, UA: NEGATIVE mg/dL
Ketones, ur: 5 mg/dL — AB
Nitrite: NEGATIVE
Protein, ur: 100 mg/dL — AB
Specific Gravity, Urine: 1.02 (ref 1.005–1.030)
WBC, UA: >50 WBC/hpf — ABNORMAL HIGH (ref 0–5)
pH: 5 (ref 5.0–8.0)

## 2021-03-04 LAB — RENAL FUNCTION PANEL
Albumin: 1.4 g/dL — ABNORMAL LOW (ref 3.5–5.0)
Anion gap: 7 (ref 5–15)
BUN: 82 mg/dL — ABNORMAL HIGH (ref 8–23)
CO2: 20 mmol/L — ABNORMAL LOW (ref 22–32)
Calcium: 8.2 mg/dL — ABNORMAL LOW (ref 8.9–10.3)
Chloride: 115 mmol/L — ABNORMAL HIGH (ref 98–111)
Creatinine, Ser: 2.48 mg/dL — ABNORMAL HIGH (ref 0.44–1.00)
GFR, Estimated: 18 mL/min — ABNORMAL LOW (ref >60)
Glucose, Bld: 124 mg/dL — ABNORMAL HIGH (ref 70–99)
Phosphorus: 3.5 mg/dL (ref 2.5–4.6)
Potassium: 4.6 mmol/L (ref 3.5–5.1)
Sodium: 142 mmol/L (ref 135–145)

## 2021-03-04 LAB — COMPREHENSIVE METABOLIC PANEL
ALT: 9 U/L (ref 0–44)
AST: 32 U/L (ref 15–41)
Albumin: 1.3 g/dL — ABNORMAL LOW (ref 3.5–5.0)
Alkaline Phosphatase: 48 U/L (ref 38–126)
Anion gap: 7 (ref 5–15)
BUN: 83 mg/dL — ABNORMAL HIGH (ref 8–23)
CO2: 17 mmol/L — ABNORMAL LOW (ref 22–32)
Calcium: 8 mg/dL — ABNORMAL LOW (ref 8.9–10.3)
Chloride: 116 mmol/L — ABNORMAL HIGH (ref 98–111)
Creatinine, Ser: 2.29 mg/dL — ABNORMAL HIGH (ref 0.44–1.00)
GFR, Estimated: 20 mL/min — ABNORMAL LOW (ref >60)
Glucose, Bld: 134 mg/dL — ABNORMAL HIGH (ref 70–99)
Potassium: 5.2 mmol/L — ABNORMAL HIGH (ref 3.5–5.1)
Sodium: 140 mmol/L (ref 135–145)
Total Bilirubin: 0.8 mg/dL (ref 0.3–1.2)
Total Protein: 5 g/dL — ABNORMAL LOW (ref 6.5–8.1)

## 2021-03-04 LAB — CK: Total CK: 97 U/L (ref 38–234)

## 2021-03-04 LAB — SODIUM, URINE, RANDOM: Sodium, Ur: <10 mmol/L

## 2021-03-04 LAB — LACTIC ACID, PLASMA: Lactic Acid, Venous: 1.2 mmol/L (ref 0.5–1.9)

## 2021-03-04 LAB — BRAIN NATRIURETIC PEPTIDE: B Natriuretic Peptide: 319.2 pg/mL — ABNORMAL HIGH (ref 0.0–100.0)

## 2021-03-04 LAB — VANCOMYCIN, RANDOM: Vancomycin Rm: 35

## 2021-03-04 MED ORDER — SODIUM CHLORIDE 0.9% FLUSH
10.0000 mL | Freq: Two times a day (BID) | INTRAVENOUS | Status: DC
  Administered 2021-03-04 – 2021-03-05 (×3): 10 mL
  Administered 2021-03-05: 20 mL
  Administered 2021-03-06 – 2021-03-15 (×19): 10 mL

## 2021-03-04 MED ORDER — SODIUM CHLORIDE 0.9% FLUSH: 10.0000 mL | INTRAVENOUS | Status: DC | PRN

## 2021-03-04 MED ORDER — HALOPERIDOL LACTATE 5 MG/ML IJ SOLN
5.0000 mg | Freq: Four times a day (QID) | INTRAMUSCULAR | Status: DC | PRN
  Administered 2021-03-04 – 2021-03-12 (×5): 5 mg via INTRAVENOUS
  Filled 2021-03-04 (×5): qty 1

## 2021-03-04 MED ORDER — ORAL CARE MOUTH RINSE
15.0000 mL | Freq: Two times a day (BID) | OROMUCOSAL | Status: DC
  Administered 2021-03-04 – 2021-03-15 (×23): 15 mL via OROMUCOSAL

## 2021-03-04 MED ORDER — CHLORHEXIDINE GLUCONATE CLOTH 2 % EX PADS
6.0000 | MEDICATED_PAD | Freq: Every day | CUTANEOUS | Status: DC
  Administered 2021-03-04 – 2021-03-15 (×13): 6 via TOPICAL

## 2021-03-04 MED ORDER — ALBUMIN HUMAN 5 % IV SOLN
25.0000 g | Freq: Four times a day (QID) | INTRAVENOUS | Status: AC
  Administered 2021-03-04: 25 g via INTRAVENOUS
  Filled 2021-03-04: qty 500

## 2021-03-04 MED ORDER — HEPARIN SODIUM (PORCINE) 5000 UNIT/ML IJ SOLN
5000.0000 [IU] | Freq: Three times a day (TID) | INTRAMUSCULAR | Status: DC
  Administered 2021-03-04 – 2021-03-05 (×2): 5000 [IU] via SUBCUTANEOUS
  Filled 2021-03-04 (×2): qty 1

## 2021-03-04 MED ORDER — ENOXAPARIN SODIUM 30 MG/0.3ML ~~LOC~~ SOLN
30.0000 mg | SUBCUTANEOUS | Status: DC
  Filled 2021-03-04: qty 0.3

## 2021-03-04 MED ORDER — SODIUM BICARBONATE 650 MG PO TABS
1300.0000 mg | ORAL_TABLET | Freq: Two times a day (BID) | ORAL | Status: DC
  Administered 2021-03-05 (×3): 1300 mg via ORAL
  Filled 2021-03-04 (×4): qty 2

## 2021-03-04 NOTE — Progress Notes (Addendum)
  Speech Language Pathology Treatment: Dysphagia  Patient Details Name: [name] MRN: [id number] DOB: 1929/06/16 Today's Date: 03/04/2021 Time: 2725-3664 SLP Time Calculation (min) (ACUTE ONLY): 18 min  Assessment / Plan / Recommendation Clinical Impression  Pt was seen for dysphagia treatment. She was alert and cooperative during the session. New SLP evaluation order was placed today and Alvie Heidelberg, LPN reported that this was due to the pt spatting out her medication this morning and her having oral residue/pocketing from pills given during the prior shift. Pt had just returned to the unit and she reported that she was thirsty and hungry. This may a contributing factor to the pt's improved performance compared to 4/15. Pt accepted puree solids, thin liquids, dysphagia 2 and dysphagia 3 solids. No s/sx of aspiration were noted with any solids or liquids. Mastication was prolonged and minimally effective with dysphagia 3 solids and dysphagia 2 solids. Pt spat out dysphagia 2 solids and reported "I need to spit, I don't want this spit." Oral clearance was adequate for puree. Pt's dysphagia appears to be cognitively based. Pt's case was discussed with Dr. Lavera Guise and SLP's concerns regarding pt's behaviors (e.g., spitting and gagging) resulting in inadequate nutrition. Dr. Lavera Guise questioned the potential impact of pt's infection and anesthesia on her cognition and underlying dementia. Dr. Lavera Guise suggested that the pt's presentation may be improved on 4/18, but that palliative consult may be considered if improvement is not being demonstrated. Pt's diet will be advanced from clear liquids to dysphagia 1 solids with thin liquids. SLP will continue to follow pt.    HPI HPI: Pt is an 87 y.o. female with medical history significant for hypothyroidism, dementia, GERD, hypertension, PVD and arthritis who presented to the [facility] ED via EMS due to 4-day onset of confusion and weakness. Per report, patient was  unable to swallow within past 3 to 4 days prior to admission, she presented with gagging and choking sensation on eating.  Pt was admitted to the hospital from 2/21-3/2 due to left distal femur fracture s/p left distal femoral replacement. Per EMR, pt pt's cognition waxes and wanes from day to day at baseline, but she is typically able to speak and indicate her wants and needs. She was found to have severe sepsis due to lower extremity cellulitis and there was concern for left septic knee arthritis; ortho was consulted and pt was transferred to [facility] for evaluation. Pt diagnosed with severe sepsis, acute renal failure, Enterococcus UTI, hyponatremia, hypokalemia, and acute metabolic encephalopathy. Pt s/p left knee aspiration and injection 4/14 and irrigation and debridement on 4/16.      SLP Plan  Continue with current plan of care       Recommendations  Diet recommendations: Dysphagia 1 (puree);Thin liquid Liquids provided via: Cup;Straw Medication Administration: Crushed with puree Compensations: Slow rate;Small sips/bites;Follow solids with liquid Postural Changes and/or Swallow Maneuvers: Seated upright 90 degrees                Oral Care Recommendations: Oral care BID;Staff/trained caregiver to provide oral care Follow up Recommendations: 24 hour supervision/assistance SLP Visit Diagnosis: Dysphagia, unspecified (R13.10) Plan: Continue with current plan of care       Zenab Gronewold I. Hardin Negus, [place], [place] Office number [phone] Pager [phone]                Horton Marshall 03/04/2021, 4:34 PM

## 2021-03-04 NOTE — Progress Notes (Signed)
Peripherally Inserted Central Catheter Placement  The IV Nurse has discussed with the patient and/or persons authorized to consent for the patient, the purpose of this procedure and the potential benefits and risks involved with this procedure.  The benefits include less needle sticks, lab draws from the catheter, and the patient may be discharged home with the catheter. Risks include, but not limited to, infection, bleeding, blood clot (thrombus formation), and puncture of an artery; nerve damage and irregular heartbeat and possibility to perform a PICC exchange if needed/ordered by physician.  Alternatives to this procedure were also discussed.  Bard Power PICC patient education guide, fact sheet on infection prevention and patient information card has been provided to patient /or left at bedside.  Telephone consent obtained from daughter.  PICC Placement Documentation  PICC Single Lumen 36/12/24 Right Basilic 36 cm 0 cm (Active)  Indication for Insertion or Continuance of Line Prolonged intravenous therapies 03/04/21 1025  Exposed Catheter (cm) 0 cm 03/04/21 1025  Site Assessment Clean;Dry;Intact 03/04/21 1025  Line Status Flushed;Saline locked;Blood return noted 03/04/21 1025  Dressing Type Transparent 03/04/21 1025  Dressing Status Clean;Intact;Dry 03/04/21 1025  Antimicrobial disc in place? Yes 03/04/21 1025  Safety Lock Not Applicable 49/75/30 0511  Line Care Tubing changed;Cap changed;Connections checked and tightened 03/04/21 1025  Line Adjustment (NICU/IV Team Only) No 03/04/21 1025  Dressing Intervention New dressing 03/04/21 1025  Dressing Change Due 03/11/21 03/04/21 1025       Rolena Infante 03/04/2021, 10:26 AM

## 2021-03-04 NOTE — Progress Notes (Signed)
@   0730 measured Hemovac, JP and WoundVac.  Hemovac- 25 ml JP-5 ml WoundVac- 0 ml

## 2021-03-04 NOTE — Progress Notes (Signed)
Pt. Very agitated and yelling out. Notified doctor. Will give haloperidol.

## 2021-03-04 NOTE — Progress Notes (Addendum)
PROGRESS NOTE    [name]  TGG:[id number] DOB: 1929-10-03 DOA: 02/23/2021 PCP: Rosita Fire, MD   Chief Complaint  Patient presents with  . Weakness  Brief Narrative: 87 year old female with a PMH of Chronic Dementia, Peripheral Vascular Disease, and multiple medical problems presented to [facility] ED on 4/11 with confusion, weakness, mild AKI (Creatinine 2.53 with baseline ~ 1 mg/dL) and acute swelling and redness of the left knee.  ON 01/14/2021, patient sustained a periprosthetic femur fractures s/p fall and underwent a Left Femur Replacement performed by Dr. Lyla Glassing.  She underwent arthrocentesis with WBC 100K consistent with a Left Knee Prosthetic Joint Infection.  On 4/14, patient was transferred to [facility] for arthroplasty and revision.  4/14 - Left Knee Arthrocentesis performed by Ortho.  Cultures sent. 4/16 - I&D, Left Total Knee Arthroplasty with Radical Synovectomy performed by Ortho Dr. Lyla Glassing.  Intraoperative cultures sent.  4/14 BCx: NG 4/14 UCx: Enterococcus Avium  Subjective: Patient evaluated postoperatively. Appears ill. No fevers. Wounds look ok.  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 - Follow up on 4/16 intraoperative aerobic and anaerobic culture, gram stain, and histopathology.  Patient likely has bone infection in stem or hardware interface thus joint fluid may return negative.  Ortho is following, appreciate. - On POD2 of Daptomycin 6 mg/kg QD and Ceftriaxone 2g QD.  Patient will need at least 6 weeks of IV therapy followed by oral drug therapy based on susceptibilities plus Rifampin 300-450 mg PO BID.  Infectious Disease is following, appreciate. - Monitor LFTs and CK weekly while on Daptomycin. - On 4/17, PICC line was placed.  Leukocytosis: - WBC increased from 13 to 21K.  May be stress reaction.  Will monitor  daily.  Left Lower Extremity Non-Purulent Cellulitis: - Check MRI of ankle to evaluate for osteomyelitis. - Continue antibiotics as above.   Stage 4 Sacral Ulcer, POA: - Check MRI of sacrum to evaluate for osteomyelitis. - Continue antibiotics as above.   Enterococcus Avium UTI: 4/14 Urine cultures are resistant to Ampicillin and sensitive to Vancomycin. - Completed 7 days of Vancomycin.  Acute Kidney Injury with mild metabolic acidosis: - Creatinine increased from 1.9 to 2.4 mg/dL after fluids.  Baseline ~ 1.1 mg/dL. - BC is 20 with baseline ~ 25 mmol/L. - I/O (+)5L since admission.  UO is 75 CCs x 24 hours. - I consulted Nephrology who started patient on IV Albumin given significant hypoalbuminemia (Alb 1.4 g/dL).  We will hold off on additional fluids for now.  Acute Toxic Metabolic Encephalopathy in the setting of Chronic Dementia: - Mental status has improved a little bit with fluids and antibiotics.  Peripheral Vascular Disease: - Continue Aspirin, Plavix, and Statin.  Paroxysmal Atrial Fibrillation: - Continue Metoprolol for rate control. - Hold anticoagulation due to high bleeding risk. - Monitor electrolytes. - Monitor on telemetry.  Grade 1 Diastolic Heart Failure: 7/03 Echo showed Grade 1 DD and normal EF. - Patient does not appear volume overloaded. - Echo also showed small LV cavity with hyperdynamic function.  Continue beta blocker for afterload reduction.    Physical Deconditioning: -  PT evaluated patient today.  She will require SNF on discharge.  Chronic Anemia - B12 and IDA: HH is stable. Essential Hypertension: BP is stable. Hold off on meds. GERD: PPI Hypothyroidism: Synthroid   Diet Order  Diet Heart Room service appropriate? Yes; Fluid consistency: Thin  Diet effective now                 Nutrition Problem: Increased nutrient needs Etiology: wound healing (3 stage 4 PI and a DTI to left heel) Signs/Symptoms: estimated  needs Interventions: Ensure Enlive (each supplement provides 350kcal and 20 grams of protein),MVI,Juven,Prostat Patient's Body mass index is 37.44 kg/m.  Pressure Injury 03/08/2021 Sacrum Mid Stage 4 - Full thickness tissue loss with exposed bone, tendon or muscle. tunneling (Active)  03/17/2021 2105  Location: Sacrum  Location Orientation: Mid  Staging: Stage 4 - Full thickness tissue loss with exposed bone, tendon or muscle.  Wound Description (Comments): tunneling  Present on Admission: Yes     Pressure Injury 03/16/2021 Sacrum Mid Stage 4 - Full thickness tissue loss with exposed bone, tendon or muscle. tunneling (Active)  03/13/2021 2106  Location: Sacrum  Location Orientation: Mid  Staging: Stage 4 - Full thickness tissue loss with exposed bone, tendon or muscle.  Wound Description (Comments): tunneling  Present on Admission: Yes     Pressure Injury 02/26/21 Ankle Right;Lateral Stage 4 - Full thickness tissue loss with exposed bone, tendon or muscle. (Active)  02/26/21 0202  Location: Ankle  Location Orientation: Right;Lateral  Staging: Stage 4 - Full thickness tissue loss with exposed bone, tendon or muscle.  Wound Description (Comments):   Present on Admission:      Pressure Injury 02/26/21 Foot Left;Lateral Deep Tissue Pressure Injury - Purple or maroon localized area of discolored intact skin or blood-filled blister due to damage of underlying soft tissue from pressure and/or shear. (Active)  02/26/21 0158  Location: Foot  Location Orientation: Left;Lateral  Staging: Deep Tissue Pressure Injury - Purple or maroon localized area of discolored intact skin or blood-filled blister due to damage of underlying soft tissue from pressure and/or shear.  Wound Description (Comments):   Present on Admission: Yes    DVT prophylaxis: heparin injection 5,000 Units Start: 03/04/21 1400 SCDs Start: 02/19/2021 1405 Code Status:   Code Status: Full Code  Family Communication: plan of care  discussed with patient at bedside.  Status is: Inpatient Remains inpatient appropriate because:IV treatments appropriate due to intensity of illness or inability to take PO and Inpatient level of care appropriate due to severity of illness  Dispo: The patient is from: Home              Anticipated d/c is to: TBD              Patient currently is not medically stable to d/c.   Difficult to place patient No Unresulted Labs (From admission, onward)          Start     Ordered   03/10/21 0500  Creatinine, serum  (enoxaparin (LOVENOX)  CrCl >/= 30 mL/min  )  Weekly,   R     Comments: while on enoxaparin therapy.   Question:  Specimen collection method  Answer:  Lab=Lab collect   02/20/2021 1404   03/04/21 1336  Brain natriuretic peptide  Once,   R       Question:  Specimen collection method  Answer:  Lab=Lab collect   03/04/21 1335   03/04/21 1336  Lactic acid, plasma  ONCE - STAT,   STAT       Question:  Specimen collection method  Answer:  Lab=Lab collect   03/04/21 1335   03/04/21 0717  Urinalysis, Routine w reflex microscopic  Once,  R        03/04/21 0717   03/04/21 0703  Sodium, urine, random  Once,   R        03/04/21 0702   03/04/21 0500  CBC  Daily,   R     Question:  Specimen collection method  Answer:  Lab=Lab collect   02/26/2021 1404   03/04/21 4825  Basic metabolic panel  Daily,   R     Question:  Specimen collection method  Answer:  Lab=Lab collect   03/02/2021 1322   03/04/21 0500  CBC with Differential/Platelet  Daily,   R     Question:  Specimen collection method  Answer:  Lab=Lab collect   03/16/2021 1322        Medications reviewed:  Scheduled Meds: . aspirin EC  81 mg Oral Q breakfast  . Chlorhexidine Gluconate Cloth  6 each Topical Daily  . cilostazol  100 mg Oral BID  . docusate sodium  100 mg Oral BID  . feeding supplement  237 mL Oral TID BM  . ferrous sulfate  325 mg Oral Q breakfast  . heparin injection (subcutaneous)  5,000 Units Subcutaneous Q8H  .  levothyroxine  100 mcg Oral Q0600  . mouth rinse  15 mL Mouth Rinse BID  . metoprolol tartrate  12.5 mg Oral BID  . multivitamin with minerals  1 tablet Oral Daily  . nutrition supplement (JUVEN)  1 packet Oral BID BM  . pantoprazole  40 mg Oral QAC breakfast  . senna-docusate  1 tablet Oral QHS  . sodium bicarbonate  1,300 mg Oral BID  . sodium chloride flush  10-40 mL Intracatheter Q12H  . vitamin B-12  1,000 mcg Oral Daily   Continuous Infusions: . albumin human 25 g (03/04/21 1331)  . cefTRIAXone (ROCEPHIN)  IV 2 g (02/18/2021 2349)  . [START ON 03/05/2021] DAPTOmycin (CUBICIN)  IV      Consultants:see note  Procedures:see note  Antimicrobials: Anti-infectives (From admission, onward)   Start     Dose/Rate Route Frequency Ordered Stop   03/05/21 1454  DAPTOmycin (CUBICIN) 500 mg in sodium chloride 0.9 % IVPB        500 mg 120 mL/hr over 30 Minutes Intravenous Every 48 hours 03/17/2021 1517     03/10/2021 2300  cefTRIAXone (ROCEPHIN) 2 g in sodium chloride 0.9 % 100 mL IVPB        2 g 200 mL/hr over 30 Minutes Intravenous Every 24 hours 02/28/2021 2236     02/16/2021 2000  DAPTOmycin (CUBICIN) 500 mg in sodium chloride 0.9 % IVPB  Status:  Discontinued        8 mg/kg  61.3 kg 120 mL/hr over 30 Minutes Intravenous Every 48 hours 03/02/21 0855 02/19/2021 1320   03/11/2021 1500  DAPTOmycin (CUBICIN) 600 mg in sodium chloride 0.9 % IVPB  Status:  Discontinued        6 mg/kg  99 kg 124 mL/hr over 30 Minutes Intravenous Daily 02/26/2021 1400 03/10/2021 1402   02/18/2021 1500  DAPTOmycin (CUBICIN) 500 mg in sodium chloride 0.9 % IVPB  Status:  Discontinued        500 mg 120 mL/hr over 30 Minutes Intravenous Daily 02/18/2021 1405 03/13/2021 1517   02/21/2021 1045  tobramycin (NEBCIN) powder  Status:  Discontinued          As needed 03/09/2021 1045 03/04/2021 1201   02/27/2021 1037  vancomycin (VANCOCIN) powder  Status:  Discontinued  As needed 03/16/2021 1044 03/05/2021 1201   03/06/2021 0800  vancomycin  (VANCOCIN) IVPB 1000 mg/200 mL premix        1,000 mg 200 mL/hr over 60 Minutes Intravenous On call to O.R. 02/19/2021 0752 03/05/2021 1040   03/02/21 2200  cefTRIAXone (ROCEPHIN) injection 2 g  Status:  Discontinued        2 g Intramuscular Every 24 hours 03/02/21 0904 02/19/2021 2235   03/02/21 0600  ceFAZolin (ANCEF) IVPB 2g/100 mL premix  Status:  Discontinued        2 g 200 mL/hr over 30 Minutes Intravenous On call to O.R. 03/02/21 0110 02/17/2021 0559   02/27/21 2200  vancomycin (VANCOREADY) IVPB 750 mg/150 mL  Status:  Discontinued        750 mg 150 mL/hr over 60 Minutes Intravenous Every 48 hours 02/20/2021 2342 03/02/21 0855   02/26/2021 2315  vancomycin (VANCOCIN) IVPB 1000 mg/200 mL premix        1,000 mg 200 mL/hr over 60 Minutes Intravenous  Once 02/18/2021 2313 02/26/21 0115   02/28/2021 2315  ceFEPIme (MAXIPIME) 2 g in sodium chloride 0.9 % 100 mL IVPB  Status:  Discontinued        2 g 200 mL/hr over 30 Minutes Intravenous Every 24 hours 03/01/2021 2313 03/02/21 0904     Culture/Microbiology    Component Value Date/Time   SDES FLUID 03/01/2021 1102   SPECREQUEST SYNOVIAL LEFT KNEE 03/01/2021 1102   CULT  03/01/2021 1102    NO GROWTH 3 DAYS Performed at [facility] [address], [place], [place] [address]    REPTSTATUS PENDING 03/01/2021 1102    Other culture-see note  Objective: Vitals: Today's Vitals   02/26/2021 2325 03/04/21 0356 03/04/21 1100 03/04/21 1432  BP: 102/60 (!) 92/58  (!) 94/57  Pulse: 90 90  100  Resp: 16 20  17   Temp: 98.8 F (37.1 C) 98.2 F (36.8 C)  98.9 F (37.2 C)  TempSrc:  Oral  Axillary  SpO2: 100% 100%  98%  Weight:      Height:      PainSc:   0-No pain     Intake/Output Summary (Last 24 hours) at 03/04/2021 1440 Last data filed at 03/04/2021 1134 Gross per 24 hour  Intake 1591.86 ml  Output 215 ml  Net 1376.86 ml   Filed Weights   02/26/21 0148 02/28/21 0500 03/02/21 1024  Weight: 61.2 kg 61.3 kg 99 kg   Weight change:    Intake/Output from previous day: 04/16 0701 - 04/17 0700 In: 3040.9 [I.V.:2216.9; Blood:559; IV MVEHMCNOB:096] Out: 2836 [Urine:350; Drains:355; Blood:100] Intake/Output this shift: Total I/O In: 10 [I.V.:10] Out: 30 [Drains:30] Filed Weights   02/26/21 0148 02/28/21 0500 03/02/21 1024  Weight: 61.2 kg 61.3 kg 99 kg    Examination: General exam: AAOx1-2, ill looking, elderly, frail and weak. HEENT:Oral mucosa moist, Ear/Nose WNL grossly, dentition normal. Respiratory system: bilaterally diminished,no wheezing or crackles,no use of accessory muscle Cardiovascular system: S1 & S2 +, No JVD, no significant murmurs Gastrointestinal system: Abdomen soft, NT,ND, BS+ Nervous System:Alert, awake, moving extremities and grossly nonfocal Extremities: No edema, distal peripheral pulses palpable.  Left knee area is tender left lower extremity swollen Skin: sacral lesions noted MSK: physical deconditioning  Data Reviewed: I have personally reviewed following labs and imaging studies CBC: Recent Labs  Lab 03/16/2021 2005 02/26/21 0534 02/28/21 0511 03/01/21 0211 03/02/21 0151 02/25/2021 0103 03/04/21 0733  WBC 17.2*   < > 18.7* 16.6* 14.6*  13.6* 21.4*  NEUTROABS 15.1*  --   --   --   --   --  18.1*  HGB 10.2*   < > 8.5* 8.7* 7.5* 7.4* 7.3*  HCT 31.9*   < > 26.6* 26.9* 23.3* 22.8* 22.4*  MCV 94.7   < > 95.0 93.4 93.2 93.4 90.3  PLT 375   < > 290 317 270 255 220   < > = values in this interval not displayed.   Basic Metabolic Panel: Recent Labs  Lab 02/26/21 0534 02/28/21 0511 03/01/21 0211 03/02/21 0151 03/06/2021 0103 03/04/21 0519 03/04/21 0740  NA 134* 133* 133* 134* 140 140 142  K 3.8 3.4* 4.6 4.5 4.3 5.2* 4.6  CL 100 102 106 110 112* 116* 115*  CO2 22 22 23  20* 21* 17* 20*  GLUCOSE 78 86 104* 78 88 134* 124*  BUN 70* 68* 68* 75* 75* 83* 82*  CREATININE 2.17* 2.04* 2.07* 1.98* 1.97* 2.29* 2.48*  CALCIUM 8.2* 8.0* 8.1* 8.0* 8.2* 8.0* 8.2*  MG 2.0 1.9  --   --   --   --    --   PHOS 4.3  --  2.9  --   --   --  3.5   GFR: Estimated Creatinine Clearance: 17.6 mL/min (A) (by C-G formula based on SCr of 2.48 mg/dL (H)). Liver Function Tests: Recent Labs  Lab 03/06/2021 2005 02/26/21 0534 03/01/21 0211 03/02/21 0151 02/16/2021 0103 03/04/21 0519 03/04/21 0740  AST 14* 13*  --  19 24 32  --   ALT 9 8  --  11 13 9   --   ALKPHOS 80 69  --  59 56 48  --   BILITOT 0.7 0.7  --  0.7 0.4 0.8  --   PROT 7.1 6.4*  --  5.7* 5.8* 5.0*  --   ALBUMIN 2.3* 2.0* 1.6* 1.4* 1.4* 1.3* 1.4*   Coagulation Profile: Recent Labs  Lab 03/11/2021 2005 02/26/21 0534  INR 1.2 1.3*   Cardiac Enzymes: Recent Labs  Lab 03/07/2021 0103 03/04/21 0519  CKTOTAL 69 97   CBG: Recent Labs  Lab 02/28/2021 1939  GLUCAP 84   Sepsis Labs: Recent Labs  Lab 02/27/2021 2005 03/10/2021 2302  LATICACIDVEN 1.4 0.9    Recent Results (from the past 240 hour(s))  Blood culture (routine single)     Status: None   Collection Time: 03/10/2021  8:57 PM   Specimen: Right Antecubital; Blood  Result Value Ref Range Status   Specimen Description RIGHT ANTECUBITAL  Final   Special Requests   Final    BOTTLES DRAWN AEROBIC AND ANAEROBIC Blood Culture adequate volume   Culture   Final    NO GROWTH 5 DAYS Performed at [facility], [address] [place], [place] [address]    Report Status 03/02/2021 FINAL  Final  Resp Panel by RT-PCR (Flu A&B, Covid) Nasopharyngeal Swab     Status: None   Collection Time: 03/04/2021  9:00 PM   Specimen: Nasopharyngeal Swab; Nasopharyngeal(NP) swabs in vial transport medium  Result Value Ref Range Status   SARS Coronavirus 2 by RT PCR NEGATIVE NEGATIVE Final    Comment: (NOTE) SARS-CoV-2 target nucleic acids are NOT DETECTED.  The SARS-CoV-2 RNA is generally detectable in upper respiratory specimens during the acute phase of infection. The lowest concentration of SARS-CoV-2 viral copies this assay can detect is 138 copies/mL. A negative result does not preclude  SARS-Cov-2 infection and should not be used as the sole basis  for treatment or other patient management decisions. A negative result may occur with  improper specimen collection/handling, submission of specimen other than nasopharyngeal swab, presence of viral mutation(s) within the areas targeted by this assay, and inadequate number of viral copies(<138 copies/mL). A negative result must be combined with clinical observations, patient history, and epidemiological information. The expected result is Negative.  Fact Sheet for Patients:  EntrepreneurPulse.com.au  Fact Sheet for Healthcare Providers:  IncredibleEmployment.be  This test is no t yet approved or cleared by the [place] FDA and  has been authorized for detection and/or diagnosis of SARS-CoV-2 by FDA under an Emergency Use Authorization (EUA). This EUA will remain  in effect (meaning this test can be used) for the duration of the COVID-19 declaration under Section 564(b)(1) of the Act, 21 U.S.C.section 360bbb-3(b)(1), unless the authorization is terminated  or revoked sooner.       Influenza A by PCR NEGATIVE NEGATIVE Final   Influenza B by PCR NEGATIVE NEGATIVE Final    Comment: (NOTE) The Xpert Xpress SARS-CoV-2/FLU/RSV plus assay is intended as an aid in the diagnosis of influenza from Nasopharyngeal swab specimens and should not be used as a sole basis for treatment. Nasal washings and aspirates are unacceptable for Xpert Xpress SARS-CoV-2/FLU/RSV testing.  Fact Sheet for Patients: EntrepreneurPulse.com.au  Fact Sheet for Healthcare Providers: IncredibleEmployment.be  This test is not yet approved or cleared by the [place] FDA and has been authorized for detection and/or diagnosis of SARS-CoV-2 by FDA under an Emergency Use Authorization (EUA). This EUA will remain in effect (meaning this test can be used) for the duration of  the COVID-19 declaration under Section 564(b)(1) of the Act, 21 U.S.C. section 360bbb-3(b)(1), unless the authorization is terminated or revoked.  Performed at [facility], [address] [place], [place] [address]   Urine culture     Status: Abnormal   Collection Time: 02/24/2021 11:57 PM   Specimen: In/Out Cath Urine  Result Value Ref Range Status   Specimen Description   Final    IN/OUT CATH URINE Performed at [facility], [address], [place], [place] [address]    Special Requests   Final    NONE Performed at [facility], [address], [place], [place] [address]    Culture 80,000 COLONIES/mL ENTEROCOCCUS AVIUM (A)  Final   Report Status 03/01/2021 FINAL  Final   Organism ID, Bacteria ENTEROCOCCUS AVIUM (A)  Final      Susceptibility   Enterococcus avium - MIC*    AMPICILLIN 16 RESISTANT Resistant     NITROFURANTOIN <=16 SENSITIVE Sensitive     VANCOMYCIN <=0.5 SENSITIVE Sensitive     * 80,000 COLONIES/mL ENTEROCOCCUS AVIUM  Body fluid culture w Gram Stain     Status: None (Preliminary result)   Collection Time: 03/01/21 11:02 AM   Specimen: Body Fluid  Result Value Ref Range Status   Specimen Description FLUID  Final   Special Requests SYNOVIAL LEFT KNEE  Final   Gram Stain   Final    ABUNDANT WBC PRESENT, PREDOMINANTLY MONONUCLEAR ABUNDANT GRAM POSITIVE COCCI IN PAIRS IN CHAINS    Culture   Final    NO GROWTH 3 DAYS Performed at [facility], [place] [address] [place], [place] [address]    Report Status PENDING  Incomplete     Radiology Studies: [place] EKG SITE RITE  Result Date: 03/04/2021 If Site Rite image not attached, placement could not be confirmed due to current cardiac rhythm.  LOS: 7 days   George Hugh, MD [facility]  03/04/2021, 2:40 PM

## 2021-03-04 NOTE — Progress Notes (Signed)
Spoke to RN, pt is still connected to wound VAC. No orders to be disconnected. Unable to proceed with scan.

## 2021-03-04 NOTE — Plan of Care (Signed)

## 2021-03-04 NOTE — Consult Note (Signed)
Nephrology Consult   Requesting provider: George Hugh Service requesting consult: Hospitalist Reason for consult: AKI   Assessment/Recommendations: [name] is a/an 87 y.o. female with a past medical history HLD, PVD, A. fib, anemia, HTN, hypothyroidism  who present w/ prothetic joint infection  Oliguric AKI: BL 0.8-1 but higher outpatient recently and unclear why. AKI here initially improved now worse over past 24 hours w/ low UOP. 5L positive but possibly outs not well documented. She does not appear overloaded and may be slightly dry. AKI likely related to acute illness with possible tubular injury 2/2 sepsis w/ borderline hypotension and ongoing tachycardia. Vanc could be contributing -Provide 5% albumin (serum level very low at 1.4) 25 x2 for hydration -Continue hydration PRN -Obtain repeat UA as well as random Vanc level -Continue to monitor daily Cr, Dose meds for GFR -Monitor Daily I/Os, Daily weight  -Maintain MAP>65 for optimal renal perfusion.  -Avoid nephrotoxic medications including NSAIDs and Vanc/Zosyn combo -Check Renal U/S  Prosthetic Joint Infection: s/p procedure with Ortho now on antibiotics. Cx w/ GPCs. F/u further results. Mgmt per primary, ortho, and ID  Sepsis: related to joint infection. WBC 21.4. Abx as above  Anemia: likely multifactorial w/ acute illness contributing. hgb of 7.3. No iron given infection. Transfusion as needed  NAGMA: likely 2/2 AKI. Bicarb 20. CTM  Encephalopathy: likely delirium related to infection. Mgmt per primary  Recommendations conveyed to primary service.    [facility] 03/04/2021 8:25 AM   _____________________________________________________________________________________ CC: AKI  History of Present Illness: [name] is a/an 87 y.o. female with a past medical history of HLD, PVD, A. fib, anemia, HTN, hypothyroidism who presents with septic joint.  Patient was altered on my  interview so history was obtained per chart review.  The patient was admitted to the hospital on 4/10 after presenting with weakness and confusion.  Signs on admission were concerning for sepsis.  Patient also had acute swelling of her left knee.  She was ultimately found to have infection within her prosthetic joint.  She was transferred from [facility] on 4/14 to [facility].  She had arthrocentesis at that time with cultures concerning for infection.  She underwent I&D with left total knee arthroplasty with radical synovectomy by Dr. Lyla Glassing on 4/16.  Her baseline Creatinine is 0.8-1 however in March of 2022 has been 1.2-1.7. Crt was 2.5 on admission and has since stayed around 2 up until today. This morning creatinine increased to 2.5 and UOP has been low for the last 24 hours.  Patient remained altered today. She was able to tell me her name but otherwise only said "I want to go home to [place]."  Medications:  Current Facility-Administered Medications  Medication Dose Route Frequency Provider Last Rate Last Admin  . acetaminophen (TYLENOL) tablet 325-650 mg  325-650 mg Oral Q6H PRN Rod Can, MD   650 mg at 03/15/2021 2319  . albumin human 5 % solution 25 g  25 g Intravenous Q6H Reesa Chew, MD      . ALPRAZolam Duanne Moron) tablet 0.25 mg  0.25 mg Oral BID PRN Rod Can, MD   0.25 mg at 03/01/21 2144  . alum & mag hydroxide-simeth (MAALOX/MYLANTA) 200-200-20 MG/5ML suspension 30 mL  30 mL Oral Q4H PRN Swinteck, Aaron Edelman, MD      . aspirin EC tablet 81 mg  81 mg Oral Q breakfast Rod Can, MD   81 mg at 03/02/21 1016  . cefTRIAXone (ROCEPHIN) 2  g in sodium chloride 0.9 % 100 mL IVPB  2 g Intravenous Q24H Skeet Simmer, RPH 200 mL/hr at 03/17/2021 2349 2 g at 03/10/2021 2349  . cilostazol (PLETAL) tablet 100 mg  100 mg Oral BID Rod Can, MD   100 mg at 03/13/2021 2322  . [START ON 03/05/2021] DAPTOmycin (CUBICIN) 500 mg in sodium chloride 0.9 % IVPB  500 mg Intravenous Q48H  Wilson Singer I, RPH      . dexamethasone (DECADRON) injection 10 mg  10 mg Intravenous Once Rod Can, MD      . diphenhydrAMINE (BENADRYL) 12.5 MG/5ML elixir 12.5-25 mg  12.5-25 mg Oral Q4H PRN Swinteck, Aaron Edelman, MD      . docusate sodium (COLACE) capsule 100 mg  100 mg Oral BID Rod Can, MD   100 mg at 03/04/2021 2319  . enoxaparin (LOVENOX) injection 30 mg  30 mg Subcutaneous Q24H Shela Leff, MD      . feeding supplement (ENSURE ENLIVE / ENSURE PLUS) liquid 237 mL  237 mL Oral TID BM Rod Can, MD   237 mL at 03/02/21 1811  . ferrous sulfate tablet 325 mg  325 mg Oral Q breakfast Rod Can, MD   325 mg at 03/02/21 1017  . HYDROcodone-acetaminophen (NORCO) 7.5-325 MG per tablet 1-2 tablet  1-2 tablet Oral Q4H PRN Swinteck, Aaron Edelman, MD      . HYDROcodone-acetaminophen (NORCO/VICODIN) 5-325 MG per tablet 1-2 tablet  1-2 tablet Oral Q4H PRN Swinteck, Aaron Edelman, MD      . levothyroxine (SYNTHROID) tablet 100 mcg  100 mcg Oral [id number] Rod Can, MD   100 mcg at 03/04/21 0559  . menthol-cetylpyridinium (CEPACOL) lozenge 3 mg  1 lozenge Oral PRN Swinteck, Aaron Edelman, MD       Or  . phenol (CHLORASEPTIC) mouth spray 1 spray  1 spray Mouth/Throat PRN Swinteck, Aaron Edelman, MD      . metoCLOPramide (REGLAN) tablet 5-10 mg  5-10 mg Oral Q8H PRN Swinteck, Aaron Edelman, MD       Or  . metoCLOPramide (REGLAN) injection 5-10 mg  5-10 mg Intravenous Q8H PRN Swinteck, Aaron Edelman, MD      . metoprolol tartrate (LOPRESSOR) tablet 12.5 mg  12.5 mg Oral BID Rod Can, MD   12.5 mg at 02/17/2021 2318  . morphine 2 MG/ML injection 0.5-1 mg  0.5-1 mg Intravenous Q2H PRN Rod Can, MD   1 mg at 02/20/2021 1828  . multivitamin with minerals tablet 1 tablet  1 tablet Oral Daily Rod Can, MD   1 tablet at 03/02/21 1015  . nutrition supplement (JUVEN) (JUVEN) powder packet 1 packet  1 packet Oral BID BM Rod Can, MD   1 packet at 03/12/2021 1455  . ondansetron (ZOFRAN) tablet 4 mg  4 mg Oral  Q6H PRN Swinteck, Aaron Edelman, MD       Or  . ondansetron (ZOFRAN) injection 4 mg  4 mg Intravenous Q6H PRN Swinteck, Aaron Edelman, MD      . pantoprazole (PROTONIX) EC tablet 40 mg  40 mg Oral QAC breakfast Rod Can, MD   40 mg at 03/02/21 1015  . polyethylene glycol (MIRALAX / GLYCOLAX) packet 17 g  17 g Oral Daily PRN Swinteck, Aaron Edelman, MD      . senna-docusate (Senokot-S) tablet 1 tablet  1 tablet Oral QHS Rod Can, MD   1 tablet at 03/14/2021 2318  . sodium bicarbonate tablet 1,300 mg  1,300 mg Oral BID Reesa Chew, MD      . vitamin B-12 (CYANOCOBALAMIN)  tablet 1,000 mcg  1,000 mcg Oral Daily Rod Can, MD   1,000 mcg at 03/02/21 1017     ALLERGIES Amoxicillin and Penicillins  MEDICAL HISTORY Past Medical History:  Diagnosis Date  . Arthritis   . Gastritis, Helicobacter pylori    Treated with Pylera, 2010  . Hypertension   . Hypothyroidism      SOCIAL HISTORY Social History   Socioeconomic History  . Marital status: Widowed    Spouse name: Not on file  . Number of children: Not on file  . Years of education: Not on file  . Highest education level: Not on file  Occupational History  . Not on file  Tobacco Use  . Smoking status: Never Smoker  . Smokeless tobacco: Never Used  Substance and Sexual Activity  . Alcohol use: No  . Drug use: No  . Sexual activity: Never  Other Topics Concern  . Not on file  Social History Narrative   Widowed, 2 daughters    Lives in [place]   Denies EtOH, smoking    Social Determinants of Health   Financial Resource Strain: Not on file  Food Insecurity: Not on file  Transportation Needs: Not on file  Physical Activity: Not on file  Stress: Not on file  Social Connections: Not on file  Intimate Partner Violence: Not on file     FAMILY HISTORY Family History  Problem Relation Age of Onset  . Colon cancer Neg Hx       Review of Systems: Unable to obtain due to the patient's AMS  Physical  Exam: Vitals:   03/02/2021 2325 03/04/21 0356  BP: 102/60 (!) 92/58  Pulse: 90 90  Resp: 16 20  Temp: 98.8 F (37.1 C) 98.2 F (36.8 C)  SpO2: 100% 100%   Total I/O In: -  Out: 30 [Drains:30]  Intake/Output Summary (Last 24 hours) at 03/04/2021 0825 Last data filed at 03/04/2021 4967 Gross per 24 hour  Intake 3040.86 ml  Output 1335 ml  Net 1705.86 ml   General: Chronically ill-appearing, lying in bed, no distress HEENT: anicteric sclera, oropharynx clear without lesions CV: Normal rate, no peripheral edema Lungs: clear to auscultation bilaterally, normal work of breathing Abd: soft, non-tender, non-distended Skin: no visible lesions or rashes Psych: Awake and alert with appropriate mood Musculoskeletal: Left leg wrapped in Ace bandage, otherwise no significant deformities Neuro: normal speech, oriented to person but not place time or situation  Test Results Reviewed Lab Results  Component Value Date   NA 142 03/04/2021   K 4.6 03/04/2021   CL 115 (H) 03/04/2021   CO2 20 (L) 03/04/2021   BUN 82 (H) 03/04/2021   CREATININE 2.48 (H) 03/04/2021   CALCIUM 8.2 (L) 03/04/2021   ALBUMIN 1.4 (L) 03/04/2021   PHOS 3.5 03/04/2021     I have reviewed all relevant outside healthcare records related to the patient's current hospitalization

## 2021-03-04 NOTE — Progress Notes (Signed)
   Subjective: 1 Day Post-Op Procedure(s) (LRB): IRRIGATION AND DEBRIDEMENT KNEE WITH POLY EXCHANGE (Left) Patient reports pain as mild.   Patient seen in rounds for Dr. Lyla Glassing. Patient resting in bed on exam. Hospitalist & Infectious disease MDs in the room evaluating patient as well.     Objective: Vital signs in last 24 hours: Temp:  [97.2 F (36.2 C)-98.8 F (37.1 C)] 98.2 F (36.8 C) (04/17 0356) Pulse Rate:  [90-101] 90 (04/17 0356) Resp:  [16-20] 20 (04/17 0356) BP: (90-137)/(45-78) 92/58 (04/17 0356) SpO2:  [99 %-100 %] 100 % (04/17 0356) FiO2 (%):  [28 %] 28 % (04/16 1405)  Intake/Output from previous day:  Intake/Output Summary (Last 24 hours) at 03/04/2021 0845 Last data filed at 03/04/2021 0734 Gross per 24 hour  Intake 2759.86 ml  Output 1335 ml  Net 1424.86 ml     Intake/Output this shift: Total I/O In: -  Out: 30 [Drains:30]  Labs: Recent Labs    03/02/21 0151 03/01/2021 0103 03/04/21 0733  HGB 7.5* 7.4* 7.3*   Recent Labs    02/18/2021 0103 03/04/21 0733  WBC 13.6* 21.4*  RBC 2.44* 2.48*  HCT 22.8* 22.4*  PLT 255 220   Recent Labs    03/04/21 0519 03/04/21 0740  NA 140 142  K 5.2* 4.6  CL 116* 115*  CO2 17* 20*  BUN 83* 82*  CREATININE 2.29* 2.48*  GLUCOSE 134* 124*  CALCIUM 8.0* 8.2*   No results for input(s): LABPT, INR in the last 72 hours.  Exam: General - Patient is Alert but does appear somewhat altered. Repeats "I want to go home" Extremity - Neurologically intact Sensation intact distally Good capillary refill, toes warm Dressing - dressing C/D/I Wound vac in place with good suction JP drain & hemovac in place with minimal output   Past Medical History:  Diagnosis Date  . Arthritis   . Gastritis, Helicobacter pylori    Treated with Pylera, 2010  . Hypertension   . Hypothyroidism     Assessment/Plan: 1 Day Post-Op Procedure(s) (LRB): IRRIGATION AND DEBRIDEMENT KNEE WITH POLY EXCHANGE (Left) Principal  Problem:   Severe Sepsis due to left leg cellulitis and possible Lt septic knee and Enterococcus UTI Active Problems:   Essential hypertension   PERIPHERAL VASCULAR DISEASE   GERD   Hypothyroidism   AKI (acute kidney injury) ([place])   Pressure injury of skin   Generalized weakness   Acute metabolic encephalopathy   Leukocytosis   Hyponatremia   Dehydration   Hypoalbuminemia   Elevated brain natriuretic peptide (BNP) level   Paroxysmal A-fib (HCC)   Altered mental status   Septic infrapatellar bursitis of left knee   Infection of total knee replacement (HCC)  Estimated body mass index is 37.44 kg/m as calculated from the following:   Height as of this encounter: 5' 4.02" (1.626 m).   Weight as of this encounter: 99 kg.   Hemoglobin of 7.3 this AM, stable from 7.4 yesterday.   DVT Prophylaxis - Lovenox Weight bearing as tolerated. Remain in knee immobilizer at all times Will keep JP drain & hemovac in place for now  Intra-op cultures obtained. These show abundant WBC & gram positive cocci. No growth on culture so far. Will continue to follow. We appreciate ID recommendations.   Up with therapy as able.  Griffith Citron, PA-C Orthopedic Surgery [phone] 03/04/2021, 8:45 AM

## 2021-03-04 NOTE — Evaluation (Signed)
Physical Therapy Evaluation Patient Details Name: [name] MRN: [id number] DOB: Apr 19, 1929 Today's Date: 03/04/2021   History of Present Illness  [name] is a 87 y/o female who presented to ED at [facility] on 4/10 with complaints of weakness, decreased appeptite, and confusion. Pt found to have sepsis due to L leg cellulitis and septic knee. Pt transfered to [facility] on 4/14. Was admitted on 01/14/21 for L distal femur fx repair and underwent L TKA. I&D with TKA resection on 02/24/2021. 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    Clinical Impression  Pt received in bed, disoriented, confused and difficult to understand. Required total Ax2 for mobility and was not able to complete supine to sit with 2 attempts. Pt tolerated PT assistance to move B LEs towards EOB in small increments but when attempting to bring trunk upright, actively resisting and stating "I can't. I'm scared." Per daughters, pt normally able to speak clearer and is not at her baseline cognitively. Pt with decreased attention, awareness, and problem solving. Would benefit from PT to improve strength, address balance, decrease risk for falls, and increase independency. Will continue to follow acutely.    Follow Up Recommendations SNF;Supervision/Assistance - 24 hour    Equipment Recommendations  Other (comment) (hoyer lift and pad)    Recommendations for Other Services       Precautions / Restrictions Precautions Precautions: Fall Precaution Comments: JP drain, L hemovac, L wound vac, watch BP (tends to run low), dementia, difficult to understand speech Restrictions Weight Bearing Restrictions: Yes LLE Weight Bearing: Weight bearing as tolerated Other Position/Activity Restrictions: Knee immobilizer on at all times      Mobility  Bed Mobility Overal bed mobility: Needs Assistance Bed Mobility: Supine to Sit;Sit to Supine     Supine to sit: Total assist;+2 for  physical assistance     General bed mobility comments: Attempted supine to sit x2, but pt resisting heavily    Transfers                 General transfer comment: unable  Ambulation/Gait             General Gait Details: unable  Stairs            Wheelchair Mobility    Modified Rankin (Stroke Patients Only)          Pertinent Vitals/Pain Pain Assessment: Faces Faces Pain Scale: Hurts whole lot Pain Location: L > R LE Pain Descriptors / Indicators: Moaning;Grimacing;Guarding Pain Intervention(s): Monitored during session    Home Living Family/patient expects to be discharged to:: Private residence Living Arrangements: Children Available Help at Discharge: Family;Available 24 hours/day Type of Home: House Home Access: Ramped entrance     Home Layout: One level;Able to live on main level with bedroom/bathroom Home Equipment: Gilford Rile - 2 wheels;Hospital bed;Wheelchair - manual;Bedside commode      Prior Function Level of Independence: Needs assistance         Comments: Pt was at SNF for 18 days before d/c home. Living with daugther. Daughter reports that pt was walking short distances at SNF, but at home they wanted to wait until home health PT came to start walking at home again. Pt mainly in bed and sometimes moving to wheelchair. Unclear how pt was transferring to wheelchair. Daughter providing assistance to dress and sponge bathe.     Hand Dominance        Extremity/Trunk Assessment   Upper  Extremity Assessment Upper Extremity Assessment: Defer to OT evaluation    Lower Extremity Assessment Lower Extremity Assessment: Generalized weakness       Communication   Communication: Expressive difficulties;Other (comment) (Difficult to understand speech)  Cognition Arousal/Alertness: Awake/alert Behavior During Therapy: Anxious Overall Cognitive Status: Impaired/Different from baseline Area of Impairment:  Orientation;Attention;Following commands;Safety/judgement;Awareness;Problem solving                 Orientation Level: Disoriented to;Place;Time;Situation Current Attention Level: Sustained   Following Commands: Follows one step commands inconsistently Safety/Judgement: Decreased awareness of safety Awareness: Emergent Problem Solving: Slow processing;Decreased initiation;Difficulty sequencing;Requires verbal cues;Requires tactile cues General Comments: Pt stating "i don't know" when asked place and time. Difficult to understand pt to fully assess cognition. Pt restless trying to pull at leads      General Comments General comments (skin integrity, edema, etc.): Family members repeatedly trying to touch and move lines and leads. PT instructing family to please not touch anything.    Exercises     Assessment/Plan    PT Assessment Patient needs continued PT services  PT Problem List Decreased strength;Decreased mobility;Decreased safety awareness;Decreased range of motion;Decreased activity tolerance;Decreased balance;Decreased cognition;Decreased knowledge of use of DME       PT Treatment Interventions DME instruction;Wheelchair mobility training;Therapeutic exercise;Gait training;Balance training;Neuromuscular re-education;Functional mobility training;Therapeutic activities;Patient/family education    PT Goals (Current goals can be found in the Care Plan section)  Acute Rehab PT Goals Patient Stated Goal: none stated    Frequency Min 3X/week   Barriers to discharge        Co-evaluation               AM-PAC PT "6 Clicks" Mobility  Outcome Measure Help needed turning from your back to your side while in a flat bed without using bedrails?: Total Help needed moving from lying on your back to sitting on the side of a flat bed without using bedrails?: Total Help needed moving to and from a bed to a chair (including a wheelchair)?: Total Help needed standing up from  a chair using your arms (e.g., wheelchair or bedside chair)?: Total Help needed to walk in hospital room?: Total Help needed climbing 3-5 steps with a railing? : Total 6 Click Score: 6    End of Session   Activity Tolerance: Patient limited by pain Patient left: in bed;with call bell/phone within reach;with family/visitor present Nurse Communication: Mobility status PT Visit Diagnosis: Muscle weakness (generalized) (M62.81);Unsteadiness on feet (R26.81)    Time:  -      Charges:             Rosita Kea, SPT

## 2021-03-05 ENCOUNTER — Inpatient Hospital Stay (HOSPITAL_COMMUNITY): Payer: Medicare Other

## 2021-03-05 ENCOUNTER — Encounter (HOSPITAL_COMMUNITY): Payer: Self-pay | Admitting: Orthopedic Surgery

## 2021-03-05 DIAGNOSIS — A419 Sepsis, unspecified organism: Secondary | ICD-10-CM | POA: Diagnosis not present

## 2021-03-05 DIAGNOSIS — R531 Weakness: Secondary | ICD-10-CM

## 2021-03-05 DIAGNOSIS — I10 Essential (primary) hypertension: Secondary | ICD-10-CM

## 2021-03-05 DIAGNOSIS — Z515 Encounter for palliative care: Secondary | ICD-10-CM

## 2021-03-05 DIAGNOSIS — E871 Hypo-osmolality and hyponatremia: Secondary | ICD-10-CM

## 2021-03-05 DIAGNOSIS — R627 Adult failure to thrive: Secondary | ICD-10-CM

## 2021-03-05 DIAGNOSIS — L89154 Pressure ulcer of sacral region, stage 4: Secondary | ICD-10-CM | POA: Diagnosis not present

## 2021-03-05 DIAGNOSIS — L89894 Pressure ulcer of other site, stage 4: Secondary | ICD-10-CM

## 2021-03-05 DIAGNOSIS — N179 Acute kidney failure, unspecified: Secondary | ICD-10-CM | POA: Diagnosis not present

## 2021-03-05 DIAGNOSIS — T8459XS Infection and inflammatory reaction due to other internal joint prosthesis, sequela: Secondary | ICD-10-CM

## 2021-03-05 DIAGNOSIS — G9341 Metabolic encephalopathy: Secondary | ICD-10-CM | POA: Diagnosis not present

## 2021-03-05 DIAGNOSIS — E860 Dehydration: Secondary | ICD-10-CM

## 2021-03-05 DIAGNOSIS — D72829 Elevated white blood cell count, unspecified: Secondary | ICD-10-CM

## 2021-03-05 DIAGNOSIS — R410 Disorientation, unspecified: Secondary | ICD-10-CM

## 2021-03-05 DIAGNOSIS — Z7189 Other specified counseling: Secondary | ICD-10-CM

## 2021-03-05 DIAGNOSIS — L89106 Pressure-induced deep tissue damage of unspecified part of back: Secondary | ICD-10-CM

## 2021-03-05 DIAGNOSIS — N289 Disorder of kidney and ureter, unspecified: Secondary | ICD-10-CM

## 2021-03-05 LAB — CBC WITH DIFFERENTIAL/PLATELET
Abs Immature Granulocytes: 0.67 10*3/uL — ABNORMAL HIGH (ref 0.00–0.07)
Basophils Absolute: 0 10*3/uL (ref 0.0–0.1)
Basophils Relative: 0 %
Eosinophils Absolute: 0 10*3/uL (ref 0.0–0.5)
Eosinophils Relative: 0 %
HCT: 17.5 % — ABNORMAL LOW (ref 36.0–46.0)
Hemoglobin: 5.6 g/dL — CL (ref 12.0–15.0)
Immature Granulocytes: 4 %
Lymphocytes Relative: 7 %
Lymphs Abs: 1.3 10*3/uL (ref 0.7–4.0)
MCH: 29.9 pg (ref 26.0–34.0)
MCHC: 32 g/dL (ref 30.0–36.0)
MCV: 93.6 fL (ref 80.0–100.0)
Monocytes Absolute: 1.1 10*3/uL — ABNORMAL HIGH (ref 0.1–1.0)
Monocytes Relative: 6 %
Neutro Abs: 14.5 10*3/uL — ABNORMAL HIGH (ref 1.7–7.7)
Neutrophils Relative %: 83 %
Platelets: 182 10*3/uL (ref 150–400)
RBC: 1.87 MIL/uL — ABNORMAL LOW (ref 3.87–5.11)
RDW: 17.3 % — ABNORMAL HIGH (ref 11.5–15.5)
WBC: 17.6 10*3/uL — ABNORMAL HIGH (ref 4.0–10.5)
nRBC: 0 % (ref 0.0–0.2)

## 2021-03-05 LAB — BASIC METABOLIC PANEL
Anion gap: 5 (ref 5–15)
Anion gap: 8 (ref 5–15)
Anion gap: 5 (ref 5–15)
BUN: 85 mg/dL — ABNORMAL HIGH (ref 8–23)
BUN: 85 mg/dL — ABNORMAL HIGH (ref 8–23)
BUN: 85 mg/dL — ABNORMAL HIGH (ref 8–23)
CO2: 21 mmol/L — ABNORMAL LOW (ref 22–32)
CO2: 21 mmol/L — ABNORMAL LOW (ref 22–32)
CO2: 21 mmol/L — ABNORMAL LOW (ref 22–32)
Calcium: 8.5 mg/dL — ABNORMAL LOW (ref 8.9–10.3)
Calcium: 8.9 mg/dL (ref 8.9–10.3)
Calcium: 8.3 mg/dL — ABNORMAL LOW (ref 8.9–10.3)
Chloride: 114 mmol/L — ABNORMAL HIGH (ref 98–111)
Chloride: 113 mmol/L — ABNORMAL HIGH (ref 98–111)
Chloride: 112 mmol/L — ABNORMAL HIGH (ref 98–111)
Creatinine, Ser: 2.87 mg/dL — ABNORMAL HIGH (ref 0.44–1.00)
Creatinine, Ser: 2.93 mg/dL — ABNORMAL HIGH (ref 0.44–1.00)
Creatinine, Ser: 2.99 mg/dL — ABNORMAL HIGH (ref 0.44–1.00)
GFR, Estimated: 15 mL/min — ABNORMAL LOW (ref >60)
GFR, Estimated: 15 mL/min — ABNORMAL LOW (ref >60)
GFR, Estimated: 14 mL/min — ABNORMAL LOW (ref >60)
Glucose, Bld: 114 mg/dL — ABNORMAL HIGH (ref 70–99)
Glucose, Bld: 96 mg/dL (ref 70–99)
Glucose, Bld: 107 mg/dL — ABNORMAL HIGH (ref 70–99)
Potassium: 4.7 mmol/L (ref 3.5–5.1)
Potassium: 4.6 mmol/L (ref 3.5–5.1)
Potassium: 4.4 mmol/L (ref 3.5–5.1)
Sodium: 140 mmol/L (ref 135–145)
Sodium: 142 mmol/L (ref 135–145)
Sodium: 138 mmol/L (ref 135–145)

## 2021-03-05 LAB — BODY FLUID CULTURE W GRAM STAIN: Culture: NO GROWTH

## 2021-03-05 LAB — CBC
HCT: 21.4 % — ABNORMAL LOW (ref 36.0–46.0)
Hemoglobin: 7 g/dL — ABNORMAL LOW (ref 12.0–15.0)
MCH: 28.9 pg (ref 26.0–34.0)
MCHC: 32.7 g/dL (ref 30.0–36.0)
MCV: 88.4 fL (ref 80.0–100.0)
Platelets: 187 10*3/uL (ref 150–400)
RBC: 2.42 MIL/uL — ABNORMAL LOW (ref 3.87–5.11)
RDW: 19.6 % — ABNORMAL HIGH (ref 11.5–15.5)
WBC: 15.6 10*3/uL — ABNORMAL HIGH (ref 4.0–10.5)
nRBC: 0 % (ref 0.0–0.2)

## 2021-03-05 LAB — PREPARE RBC (CROSSMATCH)

## 2021-03-05 MED ORDER — PANTOPRAZOLE SODIUM 40 MG IV SOLR
40.0000 mg | Freq: Two times a day (BID) | INTRAVENOUS | Status: DC
  Administered 2021-03-05 – 2021-03-08 (×7): 40 mg via INTRAVENOUS
  Filled 2021-03-05 (×7): qty 40

## 2021-03-05 MED ORDER — HYDROCODONE-ACETAMINOPHEN 5-325 MG PO TABS: 1.0000 | ORAL_TABLET | ORAL | 0 refills | Status: AC | PRN

## 2021-03-05 MED ORDER — MIRTAZAPINE 15 MG PO TABS
7.5000 mg | ORAL_TABLET | Freq: Every day | ORAL | Status: DC
  Administered 2021-03-05: 7.5 mg via ORAL
  Filled 2021-03-05 (×2): qty 1

## 2021-03-05 MED ORDER — ASPIRIN 81 MG PO CHEW: 81.0000 mg | CHEWABLE_TABLET | Freq: Two times a day (BID) | ORAL | 0 refills | Status: AC

## 2021-03-05 MED ORDER — METOPROLOL TARTRATE 5 MG/5ML IV SOLN
5.0000 mg | INTRAVENOUS | Status: DC | PRN
  Administered 2021-03-05 – 2021-03-12 (×9): 5 mg via INTRAVENOUS
  Filled 2021-03-05 (×9): qty 5

## 2021-03-05 MED ORDER — ALBUMIN HUMAN 5 % IV SOLN
25.0000 g | Freq: Once | INTRAVENOUS | Status: AC
  Administered 2021-03-05: 25 g via INTRAVENOUS
  Filled 2021-03-05: qty 500

## 2021-03-05 MED ORDER — SODIUM CHLORIDE 0.9% IV SOLUTION: Freq: Once | INTRAVENOUS | Status: AC

## 2021-03-05 NOTE — Progress Notes (Addendum)
Pt  is having issues swallowing her meds. I  have made 1 attempt to give her med and she was unable to swallow, the med crushed in apple sauce. Provider Rathore MD, was page, this pt is at risk for aspiration

## 2021-03-05 NOTE — Consult Note (Addendum)
Palliative Medicine Inpatient Consult Note  Reason for consult:  "chronically ill, worsening CKD, severe anemia, complicated untreatable infection with chronic sacral wounds, worsening dementia, poor nutrition - overall prognosis is poor.  Would appreciate discussion regarding goals of care with family."  HPI:  Per intake H&P --> 87 year old female with a PMH of Chronic Dementia, Peripheral Vascular Disease, and multiple medical problems presented to [facility] ED on 4/11 with confusion, weakness, mild AKI (Creatinine 2.53 with baseline ~ 1 mg/dL) and acute swelling and redness of the left knee. Left Knee Arthrocentesis performed by Ortho followed by a left Total Knee Arthroplasty with Radical Synovectomy on 4/16. Patient required 1 unit of PRBC's today for anemia.   Palliative care was asked to get involved to further address goals of care in the setting of ongoing infection(s).   Clinical Assessment/Goals of Care:  *Please note that this is a verbal dictation therefore any spelling or grammatical errors are due to the "[place] [employer]" system interpretation.  I have reviewed medical records including EPIC notes, labs and imaging, received report from bedside RN, assessed the patient who is lying in bed intermittently opening her eyes, complaining of feeling cold.    I met with patients daughters, Corliss Skains and Leodis Liverpool  to further discuss diagnosis prognosis, GOC, EOL wishes, disposition and options.   I introduced Palliative Medicine as specialized medical care for people living with serious illness. It focuses on providing relief from the symptoms and stress of a serious illness. The goal is to improve quality of life for both the patient and the family.  [name] shares with me that Ayjah is from [place], [place].  She for much of her earlier life lived in the [place] area.  She had been married though is a widow since the year 01/13/04.  She has 2 daughters, Consepcion Hearing.  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  For the most part her husband was the breadwinner.  Abygayle kept the home and raised her children.  She is a woman of faith and practices within the [facility] religion.  She is a woman who loves music and is identified as having a strong spirit.  Prior to admission patient had been living in a single-family home with her daughter [name].  [name] herself has some health impairments inclusive of cancer which she is receiving radiation for to her left ear.  Despite this she has been able to help care for [facility] and up until a few weeks ago Luis was able to furniture walk in her house and perform most basic activities of daily living.  We reviewed Dodie's health history inclusive of her co-morbidities severe bilateral knee osteoarthritis quiring total knee revisions, hypertension, hypothyroidism, peripheral vascular disease, and dementia.  Per conversations with Lindora Alviar has been fairly depressed since her husband died in 01/13/04.  About 5 years ago she stopped driving.  She was apparently doing fairly well and then endured a fall which started a downward spiral.  She has suffered 2 recurrent hospitalizations.  We discussed at the present time Ivi has some pretty severe sacral ulcers which are not deemed to have good possibility of healing in the setting of Cigi's poor nutritional state with an albumin level of 1.4.  I shared with Yanette's daughters that her skin is her largest body organ and often when that starts failing other organ systems are not long to follow.  We reviewed the serious nature of  sacral ulcers.  We further reviewed the infection of Elfriede's prosthetic joint and how this is something which would require a long course of antibiotic treatment to heal and may more than likely recur.  Patient's daughter shared with me that the orthopedist had made mention of amputation.  I shared with him her daughters amputation  would not be advised given her poor nutritional state and the unlikelihood that she would heal from such an aggressive surgical procedure.  We further reviewed that this would cause worsening delirium and overall very poor health outcomes.  [name] asked me if prescribing a medication to "get Agnus eating again" would change the outcome.  I shared with her that certainly prescribing a medication like Remeron could help her to sleep better, her mood, and perhaps her appetite though I believe a lot of this is par for the course with her progressive dementia.  We further reviewed dementia and the different phases of debility that patients endure.  The context of dementia we also talked about PEG tube feeding and how this is the 1 study population which we veer away from such recommendations.  I shared with her daughters that often the placement of pegs can cause great discomfort for geriatric patients with a lack of benefit.  We reviewed Princessa's kidney dysfunction and how this is worrisome given her little urine output despite fluid boluses and good oral intake.  Reviewed that this is something to keep a very close eye on.  Reviewed that Agnus would not be a good candidate for hemodialysis.  A detailed discussion was had today regarding advanced directives - Maha had never completed these nor had she reviewed with her daughters what her wishes would be if she were ever in a debilitated state.    Concepts specific to code status, artifical feeding and hydration, continued IV antibiotics and rehospitalization was had.  I strongly recommended considering a DO NOT RESUSCITATE for Nova given her advanced age and comorbidities.  I described that pursuing a true cardiopulmonary resuscitation effort would result in tremendous trauma to Winnell's body without benefit and if anything would prolong undue suffering.  I have reviewed a MOST form with patient's daughters so that they are better informed.  We discussed that  moving forward it might be within reason to complete that again advocating for DO NOT RESUSCITATE.   I shared with the patient's daughters that Darline has begun to have more declines than advances and I worry that this is a trend which we will continue.  I shared that hospice may be worth considering.  Patient's daughter [name] remains adamant that there can be improvements if Tashona is out of the hospital and back in her home.  I was very honest with her sharing that I think this is not necessarily going to happen the way she hopes and that it would be worthwhile to start preparing herself for the worst outcome.  She was appreciative of this.  Patient's other daughter, [name] is struggling with Nahlia's hospitalization as her son died in [facility] 2 years ago of a ruptured aorta.  It is obvious that throughout her conversation [name] is struggling.  Offered her time to express her feelings regarding the present situation as well as her emotions regarding her prior situations.  Discussed the importance of continued conversation with family and their  medical providers regarding overall plan of care and treatment options, ensuring decisions are within the context of the patients values and GOCs.  Provided "Hard Choices  for [employer]" booklet.   Decision Maker: Corliss Skains (daughter) [phone]  SUMMARY OF RECOMMENDATIONS   Full code/full scope of care - I strongly recommended consideration of DNR CODE STATUS to patient's daughters and provided a MOST form.  I shared that we should complete this together so that goals for mom are clear prior to discharge.  Appreciate medical team speaking to patient's daughters as it appears they have a lot of hope for tremendous improvements  Patient's primary provider Dr. Legrand Rams, may be of benefit to get in contact with to help with goals of care conversations given that the patient's daughters already have an established rapport with this  provider  Goals at this time are to get patient home with IV antibiotics to identify if she will make further improvements  Ongoing palliative care discussions in the oncoming days.  Code Status/Advance Care Planning: FULL CODE   Symptom Management:  Failure to Thrive:  - Dietician involved  - Encourage 1:1 feeding  - Supplemental nutrients  - Mirtazapine 7.55m PO QHS   Palliative Prophylaxis:   Oral Care, Turn Q2H, Delirium Precuations  Additional Recommendations (Limitations, Scope, Preferences): Continue current scope of care   Psycho-social/Spiritual:   Desire for further Chaplaincy support: Yes - [employer]  Additional Recommendations: Continue education on chronic comorbidities   Prognosis: Exceptionally poor in the setting of very severe frailty.   Discharge Planning: Discharge will be to home.   Vitals:   03/05/21 0946 03/05/21 1100  BP: 126/65 124/69  Pulse: (!) 101 87  Resp: 16   Temp: 98.2 F (36.8 C)   SpO2:      Intake/Output Summary (Last 24 hours) at 03/05/2021 1421 Last data filed at 03/05/2021 0946 Gross per 24 hour  Intake 496.47 ml  Output 310 ml  Net 186.47 ml   Last Weight  Most recent update: 03/02/2021 10:25 AM   Weight  99 kg (218 lb 4.1 oz)           Gen:  Ill appearing Geriatric woman HEENT: moist mucous membranes CV: Irregular rate and rhythm  PULM: On 1LPM [place] ABD: soft/nontender  EXT: Generalized edema Neuro: Alert to self  PPS: 10%   This conversation/these recommendations were discussed with patient primary care team, Dr. MLavera Guise Time In: 1500 Time Out: 1633 Total Time: 93 Greater than 50%  of this time was spent counseling and coordinating care related to the above assessment and plan.  MRardenTeam Team Cell Phone: [phone] utilize secure chat with additional questions, if there is no response within 30 minutes please call the above phone number  Palliative  Medicine Team providers are available by phone from 7am to 7pm daily and can be reached through the team cell phone.  Should this patient require assistance outside of these hours, please call the patient's attending physician.

## 2021-03-05 NOTE — TOC Initial Note (Addendum)
Transition of Care [facility]) - Initial/Assessment Note    Patient Details  Name: [name] MRN: [id number] Date of Birth: 08-19-1929  [facility] CM/SW Contact:    Joanne Chars, LCSW Phone Number: 03/05/2021, 10:47 AM  Clinical Narrative:  Pt unable to participate in assessment.  CSW spoke initially to pt daughter [name], was directed to pt daughter [name], who lives with pt.  Pt recently at [facility], current [facility] services in place: [facility] was the name given but appears to be [facility]. (CSW confirming this)  Daughters would like for pt to return home, not go back to SNF, can provide 24/7 care for pt at home.  Pt is not vaccinated for covid.  1430: CSW spoke with Ames Dura at [facility]--she had already been contacted regarding IV abx at home for this pt.                    Expected Discharge Plan: [place] Barriers to Discharge: Continued Medical Work up   Patient Goals and CMS Choice   CMS Medicare.gov Compare Post Acute Care list provided to::  (current [facility] services in place)    Expected Discharge Plan and Services Expected Discharge Plan: [place] In-house Referral: Clinical Social Work   Post Acute Care Choice: [place] arrangements for the past 2 months: [place]: PT,OT [place] Agency: [place] ([facility]) Date [place]: 03/02/21 Time [place]: 45 Representative spoke with at [place]: Esmond Camper  Prior Living Arrangements/Services Living arrangements for the past 2 months: [place] Lives with:: Adult Children Patient language and need for interpreter reviewed:: No        Need for Family Participation in Patient Care: Yes (Comment) Care giver support system in place?: Yes (comment) Current home services: Home OT,Home PT Criminal Activity/Legal Involvement Pertinent to Current  Situation/Hospitalization: No - Comment as needed  Activities of Daily Living Home Assistive Devices/Equipment: Wheelchair ADL Screening (condition at time of admission) Patient's cognitive ability adequate to safely complete daily activities?: No Is the patient deaf or have difficulty hearing?: No Does the patient have difficulty seeing, even when wearing glasses/contacts?: No Does the patient have difficulty concentrating, remembering, or making decisions?: No Patient able to express need for assistance with ADLs?: Yes Does the patient have difficulty dressing or bathing?: Yes Independently performs ADLs?: No Communication: Independent Dressing (OT): Dependent Is this a change from baseline?: Pre-admission baseline Grooming: Dependent Is this a change from baseline?: Pre-admission baseline Feeding: Dependent Is this a change from baseline?: Pre-admission baseline Bathing: Dependent Is this a change from baseline?: Pre-admission baseline Toileting: Dependent Is this a change from baseline?: Pre-admission baseline In/Out Bed: Dependent Is this a change from baseline?: Pre-admission baseline Walks in Home: Dependent Is this a change from baseline?: Pre-admission baseline Does the patient have difficulty walking or climbing stairs?: Yes Weakness of Legs: Both Weakness of Arms/Hands: Both  Permission Sought/Granted                  Emotional Assessment Appearance:: Appears stated age Attitude/Demeanor/Rapport: Unable to Assess Affect (typically observed): Unable to Assess Orientation: : Oriented to Self Alcohol / Substance Use: Not Applicable Psych Involvement: No (comment)  Admission diagnosis:  Acute kidney injury [facility]) [N17.9] Patient Active  Problem List   Diagnosis Date Noted  . Medication monitoring encounter   . Altered mental status   . Septic infrapatellar bursitis of left knee   . Infection of total knee replacement ([place])   . Pressure injury of skin  02/26/2021  . Severe Sepsis due to left leg cellulitis and possible Lt septic knee and Enterococcus UTI 02/26/2021  . Generalized weakness 02/26/2021  . Acute metabolic encephalopathy 81/12/5484  . Leukocytosis 02/26/2021  . Hyponatremia 02/26/2021  . Dehydration 02/26/2021  . Hypoalbuminemia 02/26/2021  . Elevated brain natriuretic peptide (BNP) level 02/26/2021  . Paroxysmal A-fib ([place]) 02/26/2021  . AKI (acute kidney injury) ([place]) 02/26/2021  . 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 Diarrhea 05/23/2013  . Hypothyroidism 05/10/2013  . HYPERTHYROIDISM 12/23/2006  . HYPERLIPIDEMIA 12/23/2006  . MIGRAINE HEADACHE 12/23/2006  . CATARACT NOS 12/23/2006  . Essential hypertension 12/23/2006  . PERIPHERAL VASCULAR DISEASE 12/23/2006  . ALLERGIC RHINITIS 12/23/2006  . COPD 12/23/2006  . GERD 12/23/2006  . LOW BACK PAIN 12/23/2006  . CARDIAC MURMUR 12/23/2006  . HYPERGLYCEMIA 12/23/2006  . FRACTURE, WRIST 12/23/2006   PCP:  Rosita Fire, MD Pharmacy:   [facility], [address] [place] [address] Phone: [phone] Fax: [phone]     Social Determinants of Health (SDOH) Interventions    Readmission Risk Interventions No flowsheet data found.

## 2021-03-05 NOTE — Progress Notes (Signed)
Subjective: 2 Days Post-Op Procedure(s) (LRB): IRRIGATION AND DEBRIDEMENT KNEE WITH POLY EXCHANGE (Left) Patient resting in bed only complaining of pain with movement of RLE  Patient seen in rounds for Dr. Lyla Glassing. Patient resting in bed on exam.    Objective: Vital signs in last 24 hours: Temp:  [97.5 F (36.4 C)-98.9 F (37.2 C)] 97.8 F (36.6 C) (04/18 0655) Pulse Rate:  [84-106] 84 (04/18 0655) Resp:  [17-20] 20 (04/18 0655) BP: (94-153)/(54-89) 128/60 (04/18 0655) SpO2:  [94 %-100 %] 100 % (04/18 0655)  Intake/Output from previous day:  Intake/Output Summary (Last 24 hours) at 03/05/2021 0810 Last data filed at 03/05/2021 0655 Gross per 24 hour  Intake 206.47 ml  Output 310 ml  Net -103.53 ml     Intake/Output this shift: No intake/output data recorded.  Labs: Recent Labs    02/16/2021 0103 03/04/21 0733 03/05/21 0323  HGB 7.4* 7.3* 5.6*   Recent Labs    03/04/21 0733 03/05/21 0323  WBC 21.4* 17.6*  RBC 2.48* 1.87*  HCT 22.4* 17.5*  PLT 220 182   Recent Labs    03/04/21 0740 03/05/21 0323  NA 142 140  K 4.6 4.7  CL 115* 114*  CO2 20* 21*  BUN 82* 85*  CREATININE 2.48* 2.87*  GLUCOSE 124* 114*  CALCIUM 8.2* 8.5*   No results for input(s): LABPT, INR in the last 72 hours.  Exam: General - Patient is Alert to person Extremity - Neurologically intact Sensation intact distally Good capillary refill, toes warm Dressing - dressing C/D/I Wound vac in place with good suction JP drain in place with minimal output Hemovac removed  Past Medical History:  Diagnosis Date  . Arthritis   . Gastritis, Helicobacter pylori    Treated with Pylera, 2010  . Hypertension   . Hypothyroidism     Assessment/Plan: 2 Days Post-Op Procedure(s) (LRB): IRRIGATION AND DEBRIDEMENT KNEE WITH POLY EXCHANGE (Left) Principal Problem:   Severe Sepsis due to left leg cellulitis and possible Lt septic knee and Enterococcus UTI Active Problems:   Essential  hypertension   PERIPHERAL VASCULAR DISEASE   GERD   Hypothyroidism   AKI (acute kidney injury) ([place])   Pressure injury of skin   Generalized weakness   Acute metabolic encephalopathy   Leukocytosis   Hyponatremia   Dehydration   Hypoalbuminemia   Elevated brain natriuretic peptide (BNP) level   Paroxysmal A-fib ([facility])   Altered mental status   Septic infrapatellar bursitis of left knee   Infection of total knee replacement ([facility])   Medication monitoring encounter  Estimated body mass index is 37.44 kg/m as calculated from the following:   Height as of this encounter: 5' 4.02" (1.626 m).   Weight as of this encounter: 99 kg.   Hemoglobin of 7.3 this AM, stable from 7.4 yesterday.   DVT Prophylaxis - Lovenox Weight bearing as tolerated. Remain in knee immobilizer at all times Will keep JP drain in place.   Convert house vac to portable prevena dressing at discharge AKI: Treat per hospitalist recommendations ABLA: HgB 5.6 this AM.  2 units PRBCs being transfused at this time. Continue to treat per hospitalist recommendations PICC line placed yesterday.  Patient will need IV antibiotics at D/C as recommended once cultures return and lifelong oral antibiotic suppression  Intra-op cultures obtained. These show abundant WBC & gram positive cocci. No growth on culture so far. Will continue to follow. We appreciate ID recommendations.   Up with therapy as able. Casimer Leek  Marke Goodwyn 03/05/2021, 8:10 AM

## 2021-03-05 NOTE — Progress Notes (Signed)
[facility] ROUNDING NOTE   Subjective:   Brief history: This is an 87 year old lady with a history of hyperlipidemia peripheral vascular disease atrial fibrillation hypertension hypothyroidism who was admitted with a prosthetic joint infection.  She was treated with vancomycin.  Baseline creatinine is 0.8 to 1 mg/dL.  She has developed acute kidney injury with a creatinine 2.87 mg/dL 03/05/2021.  She is oliguric with only 75 cc of urine 03/04/2021.  Blood pressure 128/60 pulse 84 temperature 97.8 O2 sats 100% 1.5 L.  Current medications: Cilostazol 100 mg twice daily, levothyroxine 100 mcg daily, metoprolol 12.5 mg twice daily, Protonix 40 mg every 12 hours, sodium bicarbonate 1.3 g twice daily.  IV Rocephin 2 g every 24 hours, daptomycin 500 mg every 48 hours  Sodium 140 potassium 4.7 chloride 114 CO2 21 BUN 85 creatinine 2.87 glucose 114 calcium 8.5 albumin 1.4 hemoglobin 5.6   Urinalysis cloudy.  Patient is on antibiotic therapy at this time.  We will send urine culture.  Renal ultrasound reveals a solid mass.  This will need to be evaluated at some point.  Not contributing to acute illness.  She is progressively oliguric and decisions will need to be made regarding appropriateness of initiating hemodialysis.  Objective:  Vital signs in last 24 hours:  Temp:  [97.5 F (36.4 C)-98.9 F (37.2 C)] 97.8 F (36.6 C) (04/18 0655) Pulse Rate:  [84-106] 84 (04/18 0655) Resp:  [17-20] 20 (04/18 0655) BP: (94-153)/(54-89) 128/60 (04/18 0655) SpO2:  [94 %-100 %] 100 % (04/18 0655)  Weight change:  Filed Weights   02/26/21 0148 02/28/21 0500 03/02/21 1024  Weight: 61.2 kg 61.3 kg 99 kg    Intake/Output: I/O last 3 completed shifts: In: 1788.3 [I.V.:1496.9; Other:20; IV Piggyback:271.5] Out: 430 [Urine:175; Drains:255]   Intake/Output this shift:  No intake/output data recorded.  General: Chronically ill-appearing, lying in bed, no distress HEENT: anicteric sclera,  oropharynx clear without lesions CV: Normal rate, no peripheral edema Lungs: clear to auscultation bilaterally, normal work of breathing Abd: soft, non-tender, non-distended Skin: no visible lesions or rashes Psych: Awake and alert with appropriate mood Musculoskeletal: Left leg wrapped in Ace bandage, otherwise no significant deformities Neuro: normal speech, oriented to person but not place time or situation  Basic Metabolic Panel: Recent Labs  Lab 02/28/21 0511 03/01/21 0211 03/02/21 0151 03/16/2021 0103 03/04/21 0519 03/04/21 0740 03/05/21 0323  NA 133* 133* 134* 140 140 142 140  K 3.4* 4.6 4.5 4.3 5.2* 4.6 4.7  CL 102 106 110 112* 116* 115* 114*  CO2 22 23 20* 21* 17* 20* 21*  GLUCOSE 86 104* 78 88 134* 124* 114*  BUN 68* 68* 75* 75* 83* 82* 85*  CREATININE 2.04* 2.07* 1.98* 1.97* 2.29* 2.48* 2.87*  CALCIUM 8.0* 8.1* 8.0* 8.2* 8.0* 8.2* 8.5*  MG 1.9  --   --   --   --   --   --   PHOS  --  2.9  --   --   --  3.5  --     Liver Function Tests: Recent Labs  Lab 03/01/21 0211 03/02/21 0151 02/17/2021 0103 03/04/21 0519 03/04/21 0740  AST  --  19 24 32  --   ALT  --  11 13 9   --   ALKPHOS  --  59 56 48  --   BILITOT  --  0.7 0.4 0.8  --   PROT  --  5.7* 5.8* 5.0*  --   ALBUMIN 1.6* 1.4* 1.4*  1.3* 1.4*   No results for input(s): LIPASE, AMYLASE in the last 168 hours. No results for input(s): AMMONIA in the last 168 hours.  CBC: Recent Labs  Lab 03/01/21 0211 03/02/21 0151 03/02/2021 0103 03/04/21 0733 03/05/21 0323  WBC 16.6* 14.6* 13.6* 21.4* 17.6*  NEUTROABS  --   --   --  18.1* 14.5*  HGB 8.7* 7.5* 7.4* 7.3* 5.6*  HCT 26.9* 23.3* 22.8* 22.4* 17.5*  MCV 93.4 93.2 93.4 90.3 93.6  PLT 317 270 255 220 182    Cardiac Enzymes: Recent Labs  Lab 03/02/2021 0103 03/04/21 0519  CKTOTAL 69 97    BNP: Invalid input(s): POCBNP  CBG: No results for input(s): GLUCAP in the last 168 hours.  Microbiology: Results for orders placed or performed during the  hospital encounter of 02/23/2021  Blood culture (routine single)     Status: None   Collection Time: 02/24/2021  8:57 PM   Specimen: Right Antecubital; Blood  Result Value Ref Range Status   Specimen Description RIGHT ANTECUBITAL  Final   Special Requests   Final    BOTTLES DRAWN AEROBIC AND ANAEROBIC Blood Culture adequate volume   Culture   Final    NO GROWTH 5 DAYS Performed at [facility], [address] [place], [place] [address]    Report Status 03/02/2021 FINAL  Final  Resp Panel by RT-PCR (Flu A&B, Covid) Nasopharyngeal Swab     Status: None   Collection Time: 02/21/2021  9:00 PM   Specimen: Nasopharyngeal Swab; Nasopharyngeal(NP) swabs in vial transport medium  Result Value Ref Range Status   SARS Coronavirus 2 by RT PCR NEGATIVE NEGATIVE Final    Comment: (NOTE) SARS-CoV-2 target nucleic acids are NOT DETECTED.  The SARS-CoV-2 RNA is generally detectable in upper respiratory specimens during the acute phase of infection. The lowest concentration of SARS-CoV-2 viral copies this assay can detect is 138 copies/mL. A negative result does not preclude SARS-Cov-2 infection and should not be used as the sole basis for treatment or other patient management decisions. A negative result may occur with  improper specimen collection/handling, submission of specimen other than nasopharyngeal swab, presence of viral mutation(s) within the areas targeted by this assay, and inadequate number of viral copies(<138 copies/mL). A negative result must be combined with clinical observations, patient history, and epidemiological information. The expected result is Negative.  Fact Sheet for Patients:  EntrepreneurPulse.com.au  Fact Sheet for Healthcare Providers:  IncredibleEmployment.be  This test is no t yet approved or cleared by the [place] FDA and  has been authorized for detection and/or diagnosis of SARS-CoV-2 by FDA under an Emergency Use  Authorization (EUA). This EUA will remain  in effect (meaning this test can be used) for the duration of the COVID-19 declaration under Section 564(b)(1) of the Act, 21 U.S.C.section 360bbb-3(b)(1), unless the authorization is terminated  or revoked sooner.       Influenza A by PCR NEGATIVE NEGATIVE Final   Influenza B by PCR NEGATIVE NEGATIVE Final    Comment: (NOTE) The Xpert Xpress SARS-CoV-2/FLU/RSV plus assay is intended as an aid in the diagnosis of influenza from Nasopharyngeal swab specimens and should not be used as a sole basis for treatment. Nasal washings and aspirates are unacceptable for Xpert Xpress SARS-CoV-2/FLU/RSV testing.  Fact Sheet for Patients: EntrepreneurPulse.com.au  Fact Sheet for Healthcare Providers: IncredibleEmployment.be  This test is not yet approved or cleared by the [place] FDA and has been authorized for detection and/or diagnosis of SARS-CoV-2 by FDA  under an Emergency Use Authorization (EUA). This EUA will remain in effect (meaning this test can be used) for the duration of the COVID-19 declaration under Section 564(b)(1) of the Act, 21 U.S.C. section 360bbb-3(b)(1), unless the authorization is terminated or revoked.  Performed at [facility], [address], [place], [place] [address]   Urine culture     Status: Abnormal   Collection Time: 03/14/2021 11:57 PM   Specimen: In/Out Cath Urine  Result Value Ref Range Status   Specimen Description   Final    IN/OUT CATH URINE Performed at [facility], [address], [place], [place] [address]    Special Requests   Final    NONE Performed at [facility], [address] [place], [place] [address]    Culture 80,000 COLONIES/mL ENTEROCOCCUS AVIUM (A)  Final   Report Status 03/01/2021 FINAL  Final   Organism ID, Bacteria ENTEROCOCCUS AVIUM (A)  Final      Susceptibility   Enterococcus avium - MIC*    AMPICILLIN 16 RESISTANT Resistant      NITROFURANTOIN <=16 SENSITIVE Sensitive     VANCOMYCIN <=0.5 SENSITIVE Sensitive     * 80,000 COLONIES/mL ENTEROCOCCUS AVIUM  Body fluid culture w Gram Stain     Status: None (Preliminary result)   Collection Time: 03/01/21 11:02 AM   Specimen: Body Fluid  Result Value Ref Range Status   Specimen Description FLUID  Final   Special Requests SYNOVIAL LEFT KNEE  Final   Gram Stain   Final    ABUNDANT WBC PRESENT, PREDOMINANTLY MONONUCLEAR ABUNDANT GRAM POSITIVE COCCI IN PAIRS IN CHAINS    Culture   Final    NO GROWTH 3 DAYS Performed at [facility], [place] [address] [place], [place] [address]    Report Status PENDING  Incomplete    Coagulation Studies: No results for input(s): LABPROT, INR in the last 72 hours.  Urinalysis: Recent Labs    03/04/21 0717  COLORURINE AMBER*  LABSPEC 1.020  PHURINE 5.0  GLUCOSEU NEGATIVE  HGBUR MODERATE*  BILIRUBINUR NEGATIVE  KETONESUR 5*  PROTEINUR 100*  NITRITE NEGATIVE  LEUKOCYTESUR LARGE*      Imaging: US RENAL  Result Date: 03/04/2021 CLINICAL DATA:  AK I EXAM: RENAL / URINARY TRACT ULTRASOUND COMPLETE COMPARISON:  CT abdomen pelvis 04/16/2009 FINDINGS: Right Kidney: Renal measurements: 8.7 x 4.3 x 5.7 cm = volume: 113 mL. Echogenicity is increased. There is a solid mass in the inferior pole measuring 2.4 x 2.8 x 2.9 cm. No hydronephrosis. No shadowing renal calculi. Left Kidney: Renal measurements: 7.6 x 3.3 x 4.1 cm = volume: 54 mL. Echogenicity within normal limits. No mass or hydronephrosis visualized. Bladder: Decompressed with Foley catheter in place. Other: None. IMPRESSION: 1. There is a solid lesion in the inferior pole the right kidney measuring 2.9 cm. Further evaluation with renal protocol CT or MRI is recommended. 2. Increased echogenicity of the right kidney as can be seen in medical renal disease. 3.  Unremarkable sonographic appearance of the left kidney. Electronically Signed   By: Audie Pinto M.D.   On:  03/04/2021 15:42   [place] EKG SITE RITE  Result Date: 03/04/2021 If Site Rite image not attached, placement could not be confirmed due to current cardiac rhythm.    Medications:   . cefTRIAXone (ROCEPHIN)  IV 2 g (03/05/21 0033)  . DAPTOmycin (CUBICIN)  IV     . Chlorhexidine Gluconate Cloth  6 each Topical Daily  . cilostazol  100 mg  Oral BID  . docusate sodium  100 mg Oral BID  . feeding supplement  237 mL Oral TID BM  . ferrous sulfate  325 mg Oral Q breakfast  . levothyroxine  100 mcg Oral Q0600  . mouth rinse  15 mL Mouth Rinse BID  . metoprolol tartrate  12.5 mg Oral BID  . multivitamin with minerals  1 tablet Oral Daily  . nutrition supplement (JUVEN)  1 packet Oral BID BM  . pantoprazole (PROTONIX) IV  40 mg Intravenous Q12H  . senna-docusate  1 tablet Oral QHS  . sodium bicarbonate  1,300 mg Oral BID  . sodium chloride flush  10-40 mL Intracatheter Q12H  . vitamin B-12  1,000 mcg Oral Daily   acetaminophen, ALPRAZolam, alum & mag hydroxide-simeth, diphenhydrAMINE, haloperidol lactate, HYDROcodone-acetaminophen, HYDROcodone-acetaminophen, menthol-cetylpyridinium **OR** phenol, metoCLOPramide **OR** metoCLOPramide (REGLAN) injection, morphine injection, ondansetron **OR** ondansetron (ZOFRAN) IV, polyethylene glycol, sodium chloride flush  Assessment/ Plan:  Oliguric AKI: BL 0.8-1 but higher outpatient recently and unclear why. AKI here initially improved now worse over past 24 hours w/ low UOP. 5L positive but possibly outs not well documented. She does not appear overloaded and may be slightly dry. AKI likely related to acute illness with possible tubular injury 2/2 sepsis w/ borderline hypotension and ongoing tachycardia. Vanc could be contributing -Underwent administration of 5% albumin 03/05/2021.  Hypoalbuminemia noted on laboratory evaluation -Continue hydration PRN -Urinalysis 100 mg/dL protein.  No protein noted 03/01/2021.  WBCs greater than 50.  RBCs 11-20.  This shows  some change from urinalysis of 02/17/2021.  Random vancomycin level of 35 -Continue to monitor daily Cr, Dose meds for GFR -Monitor Daily I/Os, Daily weight  -Maintain MAP>65 for optimal renal perfusion.  -Avoid nephrotoxic medications including NSAIDs and Vanc/Zosyn combo -Renal ultrasound showed a solid lesion at the inferior pole of the right kidney 2.9 cm.  Recommend CT scan for further delineation.  We will need urology follow-up.  2.Prosthetic Joint Infection: s/p procedure with Ortho now on antibiotics. Cx w/ GPCs. F/u further results. Mgmt per primary, ortho, and ID  3.Sepsis: related to joint infection. WBC 21.4. Abx as above  4.Anemia: likely multifactorial w/ acute illness contributing. hgb of 7.3. No iron given infection. Transfusion as needed  5. NAGMA: likely 2/2 AKI. Bicarb 20. CTM     LOS: [place] @TODAY @7 :46 AM

## 2021-03-05 NOTE — Progress Notes (Signed)
PT Cancellation Note  Patient Details Name: [name] MRN: [id number] DOB: 27-Jul-1929   Cancelled Treatment:    Reason Eval/Treat Not Completed: Medical issues which prohibited therapy;Pain limiting ability to participate  PT held this a.m. due to Hgb 5.5 and to get blood transfusion. Checked back this pm and per RN patient is in a lot of pain and Palliative is to come by and talk to family (family at bedside). Will reassess 4/19.   Arby Barrette, PT Pager [phone]   Rexanne Mano 03/05/2021, 3:07 PM

## 2021-03-05 NOTE — Progress Notes (Signed)
OT Cancellation Note  Patient Details Name: [name] MRN: [id number] DOB: [birth year]   Cancelled Treatment:    Reason Eval/Treat Not Completed: Patient not medically ready (OT held this a.m. due to Hgb 5.5 and to get blood transfusion. Checked back this pm and per RN patient is in a lot of pain and Palliative is to come by and talk to family (family at bedside). Will reassess 4/19.)   Jefferey Pica, OTR/L Acute Rehabilitation Services Pager: [phone] Office: [phone]   Jefferey Pica 03/05/2021, 3:13 PM

## 2021-03-05 NOTE — Progress Notes (Addendum)
PROGRESS NOTE    [name]  VUY:[id number] DOB: 1929/03/29 DOA: 03/15/2021 PCP: [name] Fire, MD   Chief Complaint  Patient presents with  . Weakness  Brief Narrative: 87 year old female with a PMH of Chronic Dementia, Peripheral Vascular Disease, and multiple medical problems presented to [facility] ED on 4/11 with confusion, weakness, mild AKI (Creatinine 2.53 with baseline ~ 1 mg/dL) and acute swelling and redness of the left knee.    On 2/27 - patient sustained a periprosthetic femur fracture s/p mechanical fall and underwent a Left Femur Replacement performed by Dr. Lyla Glassing.     On 4/14 - Left Knee Arthrocentesis performed by Ortho.  WBC was 100K.  Cultures are growing GPCs.  On 4/16 - I&D, Left Total Knee Arthroplasty with Radical Synovectomy performed by Ortho Dr. Lyla Glassing.  Intraoperative cultures sent.  On 4/18 - Hb was 5.5 g/dL, received 1 unit pRBC  4/14 Blood Culture: NG 4/14 Urine Culture: Enterococcus Avium  Subjective: Patient mental status slightly better today - but overall condition remains extremely poor. She is acutely volume overloaded. She is oliguric putting out ~ 200 CCs x 24 hours. Hb dropped to 5.5 this am.  She is receiving 1 unit pRBC.  There is no acute bleeding on exam.  Wound vac has small amount of blood. She can swallow liquids and tolerated Ensure today.  Ability to swallow is dependent on mental status.  Assessment & Plan:  Acute Anemia - postop blood loss in the setting of AOCKD - S/P 1 unit pRBC.  Repeat Hb is 7 g/dL.  Hold off on additional units as patient is volume overloaded and creatinine is acutely worsening with oliguria. - Check FOBT. - Start IV Protonix 40 mg BID. - Consult GI, appreciate.  2.9 cm Renal Mass: - Check Renal CT per Nephrology recommendations.  Left Knee Prosthetic Joint Infection s/p 4/16 Resection Arthroplasty and Radical Synovectomy: - 4/14 aspiration cultures show GPCs in pairs and in chains. - Follow  up on 4/16 intraoperative aerobic and anaerobic culture, gram stain, and histopathology.  Patient likely has bone infection in stem or hardware interface thus joint fluid may return negative.  Ortho is following, appreciate. - On Day 3 of Daptomycin 6 mg/kg QD and Ceftriaxone 2g QD.  Patient will need at least 6 weeks of IV therapy followed by oral drug therapy based on susceptibilities plus Rifampin 300-450 mg PO BID.  Infectious Disease is following, appreciate. - Monitor LFTs and CK weekly while on Daptomycin. - On 4/17, PICC line was placed.  Patient will need home IV antibiotics organized.  This was discussed with CM.  Leukocytosis: - WBC decreased from 21 to 15K.  Will monitor daily.  Left Lower Extremity Non-Purulent Cellulitis: - Check MRI of ankle to evaluate for osteomyelitis. - Continue antibiotics as above.   Stage 4 Sacral Ulcer, POA: - Check MRI of sacrum to evaluate for osteomyelitis. - Continue antibiotics as above.   Enterococcus Avium UTI: 4/14 Urine cultures are resistant to Ampicillin and sensitive to Vancomycin. - Completed 7 days of Vancomycin.  Acute Kidney Injury with mild metabolic acidosis: - Creatinine increased from 1.9 to 2.9 mg/dL after fluids over the last few days.  Baseline ~ 1.1 mg/dL. - BC is 21with baseline ~ 25 mmol/L. - I/O (+)5L since admission.  UO is 200 CCs x 24 hours. - I will hold off on additional fluids as patient is receiving 1 unit pRBC today and remains oliguric. - Nephrology is following, appreciate.  Acute Toxic  Metabolic Encephalopathy in the setting of Chronic Dementia - this is due to acute infection in the setting of worsening dementia: - Mental status is improved and likely close to new baseline.  Peripheral Vascular Disease: - Hold Aspirin as patient is profound anemic.  Paroxysmal Atrial Fibrillation: - Continue Metoprolol for rate control. - Hold anticoagulation due to high bleeding risk. - Monitor on telemetry.  Grade 1  Diastolic Heart Failure: 2/70 Echo showed Grade 1 DD and normal EF. - Echo also showed small LV cavity with hyperdynamic function.  Continue beta blocker for afterload reduction.    Physical Deconditioning: -  PT evaluated patient today.  She will require SNF on discharge.  Chronic Anemia - B12 and IDA: HH is stable. Essential Hypertension: BP is stable. Hold off on meds. GERD: PPI Hypothyroidism: Synthroid  Goals of Care: Given worsening renal function with oliguria, significant anemia, newly diagnosed renal mass on imaging, poor nutritional status, worsening dementia, large sacral wounds, and complicated untreatable infection requiring lifelong antibiotics, and multiple medical problems - I will consult Palliative to discuss goals of care.   Diet Order            DIET - DYS 1 Room service appropriate? No; Fluid consistency: Thin  Diet effective now                 Nutrition Problem: Increased nutrient needs Etiology: wound healing (3 stage 4 PI and a DTI to left heel) Signs/Symptoms: estimated needs Interventions: Ensure Enlive (each supplement provides 350kcal and 20 grams of protein),MVI,Juven,Prostat Patient's Body mass index is 37.44 kg/m.  Pressure Injury 03/13/2021 Sacrum Mid Stage 4 - Full thickness tissue loss with exposed bone, tendon or muscle. tunneling (Active)  03/15/2021 2105  Location: Sacrum  Location Orientation: Mid  Staging: Stage 4 - Full thickness tissue loss with exposed bone, tendon or muscle.  Wound Description (Comments): tunneling  Present on Admission: Yes     Pressure Injury 03/06/2021 Sacrum Mid Stage 4 - Full thickness tissue loss with exposed bone, tendon or muscle. tunneling (Active)  02/26/2021 2106  Location: Sacrum  Location Orientation: Mid  Staging: Stage 4 - Full thickness tissue loss with exposed bone, tendon or muscle.  Wound Description (Comments): tunneling  Present on Admission: Yes     Pressure Injury 02/26/21 Ankle Right;Lateral  Stage 4 - Full thickness tissue loss with exposed bone, tendon or muscle. (Active)  02/26/21 0202  Location: Ankle  Location Orientation: Right;Lateral  Staging: Stage 4 - Full thickness tissue loss with exposed bone, tendon or muscle.  Wound Description (Comments):   Present on Admission:      Pressure Injury 02/26/21 Foot Left;Lateral Deep Tissue Pressure Injury - Purple or maroon localized area of discolored intact skin or blood-filled blister due to damage of underlying soft tissue from pressure and/or shear. (Active)  02/26/21 0158  Location: Foot  Location Orientation: Left;Lateral  Staging: Deep Tissue Pressure Injury - Purple or maroon localized area of discolored intact skin or blood-filled blister due to damage of underlying soft tissue from pressure and/or shear.  Wound Description (Comments):   Present on Admission: Yes    DVT prophylaxis: SCDs Start: 03/08/2021 1405 Code Status:   Code Status: Full Code  Family Communication: plan of care discussed with patient at bedside.  Status is: Inpatient Remains inpatient appropriate because:IV treatments appropriate due to intensity of illness or inability to take PO and Inpatient level of care appropriate due to severity of illness  Dispo: The patient is from:  Home              Anticipated d/c is to: TBD              Patient currently is not medically stable to d/c.   Difficult to place patient No Unresulted Labs (From admission, onward)          Start     Ordered   03/10/21 0500  Creatinine, serum  (enoxaparin (LOVENOX)  CrCl >/= 30 mL/min  )  Weekly,   R     Comments: while on enoxaparin therapy.   Question:  Specimen collection method  Answer:  Lab=Lab collect   03/17/2021 1404   03/06/21 0500  CK  Tomorrow morning,   R       Question:  Specimen collection method  Answer:  Lab=Lab collect   03/05/21 1342   03/04/21 0500  CBC  Daily,   R     Question:  Specimen collection method  Answer:  Lab=Lab collect   02/19/2021 1404    03/04/21 3557  Basic metabolic panel  Daily,   R     Question:  Specimen collection method  Answer:  Lab=Lab collect   03/17/2021 1322   03/04/21 0500  CBC with Differential/Platelet  Daily,   R     Question:  Specimen collection method  Answer:  Lab=Lab collect   03/09/2021 1322        Medications reviewed:  Scheduled Meds: . Chlorhexidine Gluconate Cloth  6 each Topical Daily  . cilostazol  100 mg Oral BID  . docusate sodium  100 mg Oral BID  . feeding supplement  237 mL Oral TID BM  . levothyroxine  100 mcg Oral Q0600  . mouth rinse  15 mL Mouth Rinse BID  . metoprolol tartrate  12.5 mg Oral BID  . multivitamin with minerals  1 tablet Oral Daily  . nutrition supplement (JUVEN)  1 packet Oral BID BM  . pantoprazole (PROTONIX) IV  40 mg Intravenous Q12H  . senna-docusate  1 tablet Oral QHS  . sodium bicarbonate  1,300 mg Oral BID  . sodium chloride flush  10-40 mL Intracatheter Q12H  . vitamin B-12  1,000 mcg Oral Daily   Continuous Infusions: . cefTRIAXone (ROCEPHIN)  IV 2 g (03/05/21 0033)  . DAPTOmycin (CUBICIN)  IV      Consultants:see note  Procedures:see note  Antimicrobials: Anti-infectives (From admission, onward)   Start     Dose/Rate Route Frequency Ordered Stop   03/05/21 2000  DAPTOmycin (CUBICIN) 500 mg in sodium chloride 0.9 % IVPB        500 mg 120 mL/hr over 30 Minutes Intravenous Every 48 hours 03/16/2021 1517     03/11/2021 2300  cefTRIAXone (ROCEPHIN) 2 g in sodium chloride 0.9 % 100 mL IVPB        2 g 200 mL/hr over 30 Minutes Intravenous Every 24 hours 03/02/2021 2236     03/12/2021 2000  DAPTOmycin (CUBICIN) 500 mg in sodium chloride 0.9 % IVPB  Status:  Discontinued        8 mg/kg  61.3 kg 120 mL/hr over 30 Minutes Intravenous Every 48 hours 03/02/21 0855 02/27/2021 1320   02/27/2021 1500  DAPTOmycin (CUBICIN) 600 mg in sodium chloride 0.9 % IVPB  Status:  Discontinued        6 mg/kg  99 kg 124 mL/hr over 30 Minutes Intravenous Daily 03/15/2021 1400 03/17/2021  1402   02/18/2021 1500  DAPTOmycin (CUBICIN) 500 mg in  sodium chloride 0.9 % IVPB  Status:  Discontinued        500 mg 120 mL/hr over 30 Minutes Intravenous Daily 03/09/2021 1405 02/27/2021 1517   03/06/2021 1045  tobramycin (NEBCIN) powder  Status:  Discontinued          As needed 03/11/2021 1045 03/02/2021 1201   03/15/2021 1037  vancomycin (VANCOCIN) powder  Status:  Discontinued          As needed 03/02/2021 1044 02/20/2021 1201   02/20/2021 0800  vancomycin (VANCOCIN) IVPB 1000 mg/200 mL premix        1,000 mg 200 mL/hr over 60 Minutes Intravenous On call to O.R. 03/13/2021 0752 03/02/2021 1040   03/02/21 2200  cefTRIAXone (ROCEPHIN) injection 2 g  Status:  Discontinued        2 g Intramuscular Every 24 hours 03/02/21 0904 03/12/2021 2235   03/02/21 0600  ceFAZolin (ANCEF) IVPB 2g/100 mL premix  Status:  Discontinued        2 g 200 mL/hr over 30 Minutes Intravenous On call to O.R. 03/02/21 0110 03/17/2021 0559   02/27/21 2200  vancomycin (VANCOREADY) IVPB 750 mg/150 mL  Status:  Discontinued        750 mg 150 mL/hr over 60 Minutes Intravenous Every 48 hours 02/18/2021 2342 03/02/21 0855   02/19/2021 2315  vancomycin (VANCOCIN) IVPB 1000 mg/200 mL premix        1,000 mg 200 mL/hr over 60 Minutes Intravenous  Once 03/17/2021 2313 02/26/21 0115   02/20/2021 2315  ceFEPIme (MAXIPIME) 2 g in sodium chloride 0.9 % 100 mL IVPB  Status:  Discontinued        2 g 200 mL/hr over 30 Minutes Intravenous Every 24 hours 03/09/2021 2313 03/02/21 0904     Culture/Microbiology    Component Value Date/Time   SDES FLUID 03/01/2021 1102   SPECREQUEST SYNOVIAL LEFT KNEE 03/01/2021 1102   CULT  03/01/2021 1102    NO GROWTH 3 DAYS Performed at [facility], [place] [address], [place], [place] [address]    REPTSTATUS 03/05/2021 FINAL 03/01/2021 1102    Other culture-see note  Objective: Vitals: Today's Vitals   03/05/21 0607 03/05/21 0655 03/05/21 0946 03/05/21 1100  BP: (!) 153/89 128/60 126/65 124/69  Pulse: 86 84 (!) 101  87  Resp: 20 20 16    Temp: (!) 97.5 F (36.4 C) 97.8 F (36.6 C) 98.2 F (36.8 C)   TempSrc: Axillary Axillary Axillary   SpO2: 94% 100%    Weight:      Height:      PainSc:        Intake/Output Summary (Last 24 hours) at 03/05/2021 1342 Last data filed at 03/05/2021 0946 Gross per 24 hour  Intake 496.47 ml  Output 310 ml  Net 186.47 ml   Filed Weights   02/26/21 0148 02/28/21 0500 03/02/21 1024  Weight: 61.2 kg 61.3 kg 99 kg   Weight change:   Intake/Output from previous day: 04/17 0701 - 04/18 0700 In: 206.5 [I.V.:30; IV Piggyback:171.5] Out: 340 [Urine:175; Drains:165] Intake/Output this shift: Total I/O In: 300 [Blood:300] Out: -  Filed Weights   02/26/21 0148 02/28/21 0500 03/02/21 1024  Weight: 61.2 kg 61.3 kg 99 kg    Examination: General exam: AAOx1-2, ill looking, elderly, frail and weak. HEENT:Oral mucosa moist, Ear/Nose WNL grossly, dentition normal. Respiratory system: bilaterally diminished,no wheezing or crackles,no use of accessory muscle Cardiovascular system: S1 & S2 +, No JVD, no significant murmurs Gastrointestinal system: Abdomen soft,  NT,ND, BS+ Nervous System:Alert, awake, moving extremities and grossly nonfocal Extremities: No edema, distal peripheral pulses palpable.  Left knee area is tender left lower extremity swollen Skin: sacral lesions noted MSK: physical deconditioning  Data Reviewed: I have personally reviewed following labs and imaging studies CBC: Recent Labs  Lab 03/02/21 0151 03/09/2021 0103 03/04/21 0733 03/05/21 0323 03/05/21 1126  WBC 14.6* 13.6* 21.4* 17.6* 15.6*  NEUTROABS  --   --  18.1* 14.5*  --   HGB 7.5* 7.4* 7.3* 5.6* 7.0*  HCT 23.3* 22.8* 22.4* 17.5* 21.4*  MCV 93.2 93.4 90.3 93.6 88.4  PLT 270 255 220 182 671   Basic Metabolic Panel: Recent Labs  Lab 02/28/21 0511 03/01/21 0211 03/02/21 0151 02/18/2021 0103 03/04/21 0519 03/04/21 0740 03/05/21 0323 03/05/21 1206  NA 133* 133*   < > 140 140 142 140  142  K 3.4* 4.6   < > 4.3 5.2* 4.6 4.7 4.6  CL 102 106   < > 112* 116* 115* 114* 113*  CO2 22 23   < > 21* 17* 20* 21* 21*  GLUCOSE 86 104*   < > 88 134* 124* 114* 96  BUN 68* 68*   < > 75* 83* 82* 85* 85*  CREATININE 2.04* 2.07*   < > 1.97* 2.29* 2.48* 2.87* 2.93*  CALCIUM 8.0* 8.1*   < > 8.2* 8.0* 8.2* 8.5* 8.9  MG 1.9  --   --   --   --   --   --   --   PHOS  --  2.9  --   --   --  3.5  --   --    < > = values in this interval not displayed.   GFR: Estimated Creatinine Clearance: 14.9 mL/min (A) (by C-G formula based on SCr of 2.93 mg/dL (H)). Liver Function Tests: Recent Labs  Lab 03/01/21 0211 03/02/21 0151 03/09/2021 0103 03/04/21 0519 03/04/21 0740  AST  --  19 24 32  --   ALT  --  11 13 9   --   ALKPHOS  --  59 56 48  --   BILITOT  --  0.7 0.4 0.8  --   PROT  --  5.7* 5.8* 5.0*  --   ALBUMIN 1.6* 1.4* 1.4* 1.3* 1.4*   Coagulation Profile: No results for input(s): INR, PROTIME in the last 168 hours. Cardiac Enzymes: Recent Labs  Lab 02/19/2021 0103 03/04/21 0519  CKTOTAL 69 97   CBG: No results for input(s): GLUCAP in the last 168 hours. Sepsis Labs: Recent Labs  Lab 03/04/21 1715  LATICACIDVEN 1.2    Recent Results (from the past 240 hour(s))  Blood culture (routine single)     Status: None   Collection Time: 03/02/2021  8:57 PM   Specimen: Right Antecubital; Blood  Result Value Ref Range Status   Specimen Description RIGHT ANTECUBITAL  Final   Special Requests   Final    BOTTLES DRAWN AEROBIC AND ANAEROBIC Blood Culture adequate volume   Culture   Final    NO GROWTH 5 DAYS Performed at [facility] [place] [facility], [address] [place], [place]  [address]    Report Status 03/02/2021 FINAL  Final  Resp Panel by RT-PCR (Flu A&B, Covid) Nasopharyngeal Swab     Status: None   Collection Time: 02/21/2021  9:00 PM   Specimen: Nasopharyngeal Swab; Nasopharyngeal(NP) swabs in vial transport medium  Result Value Ref Range Status   SARS Coronavirus 2 by RT PCR NEGATIVE NEGATIVE  Final    Comment: (NOTE) SARS-CoV-2 target nucleic acids are NOT DETECTED.  The SARS-CoV-2 RNA is generally detectable in upper respiratory specimens during the acute phase of infection. The lowest concentration of SARS-CoV-2 viral copies this assay can detect is 138 copies/mL. A negative result does not preclude SARS-Cov-2 infection and should not be used as the sole basis for treatment or other patient management decisions. A negative result may occur with  improper specimen collection/handling, submission of specimen other than nasopharyngeal swab, presence of viral mutation(s) within the areas targeted by this assay, and inadequate number of viral copies(<138 copies/mL). A negative result must be combined with clinical observations, patient history, and epidemiological information. The expected result is Negative.  Fact Sheet for Patients:  EntrepreneurPulse.com.au  Fact Sheet for Healthcare Providers:  IncredibleEmployment.be  This test is no t yet approved or cleared by the [place] FDA and  has been authorized for detection and/or diagnosis of SARS-CoV-2 by FDA under an Emergency Use Authorization (EUA). This EUA will remain  in effect (meaning this test can be used) for the duration of the COVID-19 declaration under Section 564(b)(1) of the Act, 21 U.S.C.section 360bbb-3(b)(1), unless the authorization is terminated  or revoked sooner.       Influenza A by PCR NEGATIVE NEGATIVE Final   Influenza B by PCR NEGATIVE NEGATIVE Final    Comment: (NOTE) The Xpert Xpress SARS-CoV-2/FLU/RSV plus assay is intended as an aid in the diagnosis of influenza from Nasopharyngeal swab specimens and should not be used as a sole basis for treatment. Nasal washings and aspirates are unacceptable for Xpert Xpress SARS-CoV-2/FLU/RSV testing.  Fact Sheet for Patients: EntrepreneurPulse.com.au  Fact Sheet for Healthcare  Providers: IncredibleEmployment.be  This test is not yet approved or cleared by the [place] FDA and has been authorized for detection and/or diagnosis of SARS-CoV-2 by FDA under an Emergency Use Authorization (EUA). This EUA will remain in effect (meaning this test can be used) for the duration of the COVID-19 declaration under Section 564(b)(1) of the Act, 21 U.S.C. section 360bbb-3(b)(1), unless the authorization is terminated or revoked.  Performed at [facility], [address] [place], [place] [address]   Urine culture     Status: Abnormal   Collection Time: 02/20/2021 11:57 PM   Specimen: In/Out Cath Urine  Result Value Ref Range Status   Specimen Description   Final    IN/OUT CATH URINE Performed at [facility], [address] [place], [place] [address]    Special Requests   Final    NONE Performed at [facility], [address] [place], [place] [address]    Culture 80,000 COLONIES/mL ENTEROCOCCUS AVIUM (A)  Final   Report Status 03/01/2021 FINAL  Final   Organism ID, Bacteria ENTEROCOCCUS AVIUM (A)  Final      Susceptibility   Enterococcus avium - MIC*    AMPICILLIN 16 RESISTANT Resistant     NITROFURANTOIN <=16 SENSITIVE Sensitive     VANCOMYCIN <=0.5 SENSITIVE Sensitive     * 80,000 COLONIES/mL ENTEROCOCCUS AVIUM  Body fluid culture w Gram Stain     Status: None   Collection Time: 03/01/21 11:02 AM   Specimen: Body Fluid  Result Value Ref Range Status   Specimen Description FLUID  Final   Special Requests SYNOVIAL LEFT KNEE  Final   Gram Stain   Final    ABUNDANT WBC PRESENT, PREDOMINANTLY MONONUCLEAR ABUNDANT GRAM POSITIVE COCCI IN PAIRS IN CHAINS    Culture   Final  NO GROWTH 3 DAYS Performed at [facility], [place] [address], [place], [place] [address]    Report Status 03/05/2021 FINAL  Final     Radiology Studies: US RENAL  Result Date: 03/04/2021 CLINICAL DATA:  AK I EXAM: RENAL / URINARY TRACT ULTRASOUND COMPLETE  COMPARISON:  CT abdomen pelvis 04/16/2009 FINDINGS: Right Kidney: Renal measurements: 8.7 x 4.3 x 5.7 cm = volume: 113 mL. Echogenicity is increased. There is a solid mass in the inferior pole measuring 2.4 x 2.8 x 2.9 cm. No hydronephrosis. No shadowing renal calculi. Left Kidney: Renal measurements: 7.6 x 3.3 x 4.1 cm = volume: 54 mL. Echogenicity within normal limits. No mass or hydronephrosis visualized. Bladder: Decompressed with Foley catheter in place. Other: None. IMPRESSION: 1. There is a solid lesion in the inferior pole the right kidney measuring 2.9 cm. Further evaluation with renal protocol CT or MRI is recommended. 2. Increased echogenicity of the right kidney as can be seen in medical renal disease. 3.  Unremarkable sonographic appearance of the left kidney. Electronically Signed   By: Audie Pinto M.D.   On: 03/04/2021 15:42   [place] EKG SITE RITE  Result Date: 03/04/2021 If Site Rite image not attached, placement could not be confirmed due to current cardiac rhythm.    LOS: 8 days   George Hugh, MD [facility]  03/05/2021, 1:42 PM

## 2021-03-05 NOTE — Progress Notes (Addendum)
Subjective:  Patient not able to verbalize anything I could understand   Antibiotics:  Anti-infectives (From admission, onward)    Start     Dose/Rate Route Frequency Ordered Stop   03/05/21 2000  DAPTOmycin (CUBICIN) 500 mg in sodium chloride 0.9 % IVPB        500 mg 120 mL/hr over 30 Minutes Intravenous Every 48 hours 02/21/2021 1517     02/16/2021 2300  cefTRIAXone (ROCEPHIN) 2 g in sodium chloride 0.9 % 100 mL IVPB        2 g 200 mL/hr over 30 Minutes Intravenous Every 24 hours 03/09/2021 2236     03/07/2021 2000  DAPTOmycin (CUBICIN) 500 mg in sodium chloride 0.9 % IVPB  Status:  Discontinued        8 mg/kg  61.3 kg 120 mL/hr over 30 Minutes Intravenous Every 48 hours 03/02/21 0855 03/07/2021 1320   03/13/2021 1500  DAPTOmycin (CUBICIN) 600 mg in sodium chloride 0.9 % IVPB  Status:  Discontinued        6 mg/kg  99 kg 124 mL/hr over 30 Minutes Intravenous Daily 03/11/2021 1400 03/02/2021 1402   03/17/2021 1500  DAPTOmycin (CUBICIN) 500 mg in sodium chloride 0.9 % IVPB  Status:  Discontinued        500 mg 120 mL/hr over 30 Minutes Intravenous Daily 02/24/2021 1405 03/17/2021 1517   03/07/2021 1045  tobramycin (NEBCIN) powder  Status:  Discontinued          As needed 03/07/2021 1045 03/17/2021 1201   03/07/2021 1037  vancomycin (VANCOCIN) powder  Status:  Discontinued          As needed 03/15/2021 1044 03/17/2021 1201   02/26/2021 0800  vancomycin (VANCOCIN) IVPB 1000 mg/200 mL premix        1,000 mg 200 mL/hr over 60 Minutes Intravenous On call to O.R. 02/18/2021 0752 03/17/2021 1040   03/02/21 2200  cefTRIAXone (ROCEPHIN) injection 2 g  Status:  Discontinued        2 g Intramuscular Every 24 hours 03/02/21 0904 02/26/2021 2235   03/02/21 0600  ceFAZolin (ANCEF) IVPB 2g/100 mL premix  Status:  Discontinued        2 g 200 mL/hr over 30 Minutes Intravenous On call to O.R. 03/02/21 0110 02/16/2021 0559   02/27/21 2200  vancomycin (VANCOREADY) IVPB 750 mg/150 mL  Status:  Discontinued        750 mg 150  mL/hr over 60 Minutes Intravenous Every 48 hours 02/28/2021 2342 03/02/21 0855   03/09/2021 2315  vancomycin (VANCOCIN) IVPB 1000 mg/200 mL premix        1,000 mg 200 mL/hr over 60 Minutes Intravenous  Once 03/07/2021 2313 02/26/21 0115   03/13/2021 2315  ceFEPIme (MAXIPIME) 2 g in sodium chloride 0.9 % 100 mL IVPB  Status:  Discontinued        2 g 200 mL/hr over 30 Minutes Intravenous Every 24 hours 02/26/2021 2313 03/02/21 0904       Medications: Scheduled Meds:  Chlorhexidine Gluconate Cloth  6 each Topical Daily   cilostazol  100 mg Oral BID   docusate sodium  100 mg Oral BID   feeding supplement  237 mL Oral TID BM   ferrous sulfate  325 mg Oral Q breakfast   levothyroxine  100 mcg Oral Q0600   mouth rinse  15 mL Mouth Rinse BID   metoprolol tartrate  12.5 mg Oral BID   multivitamin with minerals  1  tablet Oral Daily   nutrition supplement (JUVEN)  1 packet Oral BID BM   pantoprazole (PROTONIX) IV  40 mg Intravenous Q12H   senna-docusate  1 tablet Oral QHS   sodium bicarbonate  1,300 mg Oral BID   sodium chloride flush  10-40 mL Intracatheter Q12H   vitamin B-12  1,000 mcg Oral Daily   Continuous Infusions:  cefTRIAXone (ROCEPHIN)  IV 2 g (03/05/21 0033)   DAPTOmycin (CUBICIN)  IV     PRN Meds:.acetaminophen, ALPRAZolam, alum & mag hydroxide-simeth, diphenhydrAMINE, haloperidol lactate, HYDROcodone-acetaminophen, HYDROcodone-acetaminophen, menthol-cetylpyridinium **OR** phenol, metoCLOPramide **OR** metoCLOPramide (REGLAN) injection, morphine injection, ondansetron **OR** ondansetron (ZOFRAN) IV, polyethylene glycol, sodium chloride flush    Objective: Weight change:   Intake/Output Summary (Last 24 hours) at 03/05/2021 1155 Last data filed at 03/05/2021 0946 Gross per 24 hour  Intake 496.47 ml  Output 310 ml  Net 186.47 ml   Blood pressure 124/69, pulse 87, temperature 98.2 F (36.8 C), temperature source Axillary, resp. rate 16, height 5' 4.02" (1.626 m), weight 99 kg, SpO2  100 %. Temp:  [97.5 F (36.4 C)-98.9 F (37.2 C)] 98.2 F (36.8 C) (04/18 0946) Pulse Rate:  [84-106] 87 (04/18 1100) Resp:  [16-20] 16 (04/18 0946) BP: (94-153)/(54-89) 124/69 (04/18 1100) SpO2:  [94 %-100 %] 100 % (04/18 0655)  Physical Exam: Physical Exam Constitutional:      General: She is not in acute distress.    Appearance: She is well-developed. She is ill-appearing. She is not diaphoretic.  HENT:     Head: Normocephalic and atraumatic.     Right Ear: External ear normal.     Left Ear: External ear normal.     Mouth/Throat:     Pharynx: No oropharyngeal exudate.  Eyes:     General: No scleral icterus. Cardiovascular:     Rate and Rhythm: Normal rate and regular rhythm.     Heart sounds: Normal heart sounds. No murmur heard. No friction rub. No gallop.   Pulmonary:     Effort: Pulmonary effort is normal. No respiratory distress.     Breath sounds: Normal breath sounds. No wheezing or rales.  Abdominal:     General: Bowel sounds are normal. There is no distension.     Palpations: Abdomen is soft.     Tenderness: There is no abdominal tenderness. There is no rebound.  Musculoskeletal:        General: No tenderness. Normal range of motion.  Lymphadenopathy:     Cervical: No cervical adenopathy.  Skin:    General: Skin is warm and dry.     Coloration: Skin is pale.     Findings: No erythema or rash.  Neurological:     Mental Status: She is disoriented.     Motor: No abnormal muscle tone.     Coordination: Coordination normal.     Knee with dressing in place CBC:    BMET Recent Labs    03/04/21 0740 03/05/21 0323  NA 142 140  K 4.6 4.7  CL 115* 114*  CO2 20* 21*  GLUCOSE 124* 114*  BUN 82* 85*  CREATININE 2.48* 2.87*  CALCIUM 8.2* 8.5*     Liver Panel  Recent Labs    03/16/2021 0103 03/04/21 0519 03/04/21 0740  PROT 5.8* 5.0*  --   ALBUMIN 1.4* 1.3* 1.4*  AST 24 32  --   ALT 13 9  --   ALKPHOS 56 48  --   BILITOT 0.4 0.8  --  Sedimentation Rate Recent Labs    03/08/2021 0103  ESRSEDRATE 75*   C-Reactive Protein Recent Labs    03/16/2021 0103  CRP 6.2*    Micro Results: Recent Results (from the past 720 hour(s))  Blood culture (routine single)     Status: None   Collection Time: 02/18/2021  8:57 PM   Specimen: Right Antecubital; Blood  Result Value Ref Range Status   Specimen Description RIGHT ANTECUBITAL  Final   Special Requests   Final    BOTTLES DRAWN AEROBIC AND ANAEROBIC Blood Culture adequate volume   Culture   Final    NO GROWTH 5 DAYS Performed at [facility], [address], [place], [place] [address]    Report Status 03/02/2021 FINAL  Final  Resp Panel by RT-PCR (Flu A&B, Covid) Nasopharyngeal Swab     Status: None   Collection Time: 03/01/2021  9:00 PM   Specimen: Nasopharyngeal Swab; Nasopharyngeal(NP) swabs in vial transport medium  Result Value Ref Range Status   SARS Coronavirus 2 by RT PCR NEGATIVE NEGATIVE Final    Comment: (NOTE) SARS-CoV-2 target nucleic acids are NOT DETECTED.  The SARS-CoV-2 RNA is generally detectable in upper respiratory specimens during the acute phase of infection. The lowest concentration of SARS-CoV-2 viral copies this assay can detect is 138 copies/mL. A negative result does not preclude SARS-Cov-2 infection and should not be used as the sole basis for treatment or other patient management decisions. A negative result may occur with  improper specimen collection/handling, submission of specimen other than nasopharyngeal swab, presence of viral mutation(s) within the areas targeted by this assay, and inadequate number of viral copies(<138 copies/mL). A negative result must be combined with clinical observations, patient history, and epidemiological information. The expected result is Negative.  Fact Sheet for Patients:  EntrepreneurPulse.com.au  Fact Sheet for Healthcare Providers:   IncredibleEmployment.be  This test is no t yet approved or cleared by the [place] FDA and  has been authorized for detection and/or diagnosis of SARS-CoV-2 by FDA under an Emergency Use Authorization (EUA). This EUA will remain  in effect (meaning this test can be used) for the duration of the COVID-19 declaration under Section 564(b)(1) of the Act, 21 U.S.C.section 360bbb-3(b)(1), unless the authorization is terminated  or revoked sooner.       Influenza A by PCR NEGATIVE NEGATIVE Final   Influenza B by PCR NEGATIVE NEGATIVE Final    Comment: (NOTE) The Xpert Xpress SARS-CoV-2/FLU/RSV plus assay is intended as an aid in the diagnosis of influenza from Nasopharyngeal swab specimens and should not be used as a sole basis for treatment. Nasal washings and aspirates are unacceptable for Xpert Xpress SARS-CoV-2/FLU/RSV testing.  Fact Sheet for Patients: EntrepreneurPulse.com.au  Fact Sheet for Healthcare Providers: IncredibleEmployment.be  This test is not yet approved or cleared by the [place] FDA and has been authorized for detection and/or diagnosis of SARS-CoV-2 by FDA under an Emergency Use Authorization (EUA). This EUA will remain in effect (meaning this test can be used) for the duration of the COVID-19 declaration under Section 564(b)(1) of the Act, 21 U.S.C. section 360bbb-3(b)(1), unless the authorization is terminated or revoked.  Performed at [facility], [address], [place], [place] [address]   Urine culture     Status: Abnormal   Collection Time: 02/20/2021 11:57 PM   Specimen: In/Out Cath Urine  Result Value Ref Range Status   Specimen Description   Final    IN/OUT CATH URINE Performed at [facility], [address] [place], [place] [address]    Special Requests   Final    NONE Performed at [facility], [address], [place], [place] [address]    Culture 80,000 COLONIES/mL ENTEROCOCCUS  AVIUM (A)  Final   Report Status 03/01/2021 FINAL  Final   Organism ID, Bacteria ENTEROCOCCUS AVIUM (A)  Final      Susceptibility   Enterococcus avium - MIC*    AMPICILLIN 16 RESISTANT Resistant     NITROFURANTOIN <=16 SENSITIVE Sensitive     VANCOMYCIN <=0.5 SENSITIVE Sensitive     * 80,000 COLONIES/mL ENTEROCOCCUS AVIUM  Body fluid culture w Gram Stain     Status: None   Collection Time: 03/01/21 11:02 AM   Specimen: Body Fluid  Result Value Ref Range Status   Specimen Description FLUID  Final   Special Requests SYNOVIAL LEFT KNEE  Final   Gram Stain   Final    ABUNDANT WBC PRESENT, PREDOMINANTLY MONONUCLEAR ABUNDANT GRAM POSITIVE COCCI IN PAIRS IN CHAINS    Culture   Final    NO GROWTH 3 DAYS Performed at [facility], [place] [address] [place], [place] [address]    Report Status 03/05/2021 FINAL  Final    Studies/Results: US RENAL  Result Date: 03/04/2021 CLINICAL DATA:  AK I EXAM: RENAL / URINARY TRACT ULTRASOUND COMPLETE COMPARISON:  CT abdomen pelvis 04/16/2009 FINDINGS: Right Kidney: Renal measurements: 8.7 x 4.3 x 5.7 cm = volume: 113 mL. Echogenicity is increased. There is a solid mass in the inferior pole measuring 2.4 x 2.8 x 2.9 cm. No hydronephrosis. No shadowing renal calculi. Left Kidney: Renal measurements: 7.6 x 3.3 x 4.1 cm = volume: 54 mL. Echogenicity within normal limits. No mass or hydronephrosis visualized. Bladder: Decompressed with Foley catheter in place. Other: None. IMPRESSION: 1. There is a solid lesion in the inferior pole the right kidney measuring 2.9 cm. Further evaluation with renal protocol CT or MRI is recommended. 2. Increased echogenicity of the right kidney as can be seen in medical renal disease. 3.  Unremarkable sonographic appearance of the left kidney. Electronically Signed   By: Audie Pinto M.D.   On: 03/04/2021 15:42   [place] EKG SITE RITE  Result Date: 03/04/2021 If Site Rite image not attached, placement could not be confirmed  due to current cardiac rhythm.     Assessment/Plan:  INTERVAL HISTORY: Cultures in the operating room were unrevealing so far  Principal Problem:   Severe Sepsis due to left leg cellulitis and possible Lt septic knee and Enterococcus UTI Active Problems:   Essential hypertension   PERIPHERAL VASCULAR DISEASE   GERD   Hypothyroidism   AKI (acute kidney injury) ([place])   Pressure injury of skin   Generalized weakness   Acute metabolic encephalopathy   Leukocytosis   Hyponatremia   Dehydration   Hypoalbuminemia   Elevated brain natriuretic peptide (BNP) level   Paroxysmal A-fib (HCC)   Altered mental status   Septic infrapatellar bursitis of left knee   Infection of total knee replacement (HCC)   Medication monitoring encounter    [name] is a 87 y.o. female with history of recent distal femur fracture status post still femoral replacement discharged to skilled nursing facility but readmitted now with periprosthetic joint infection as well as to stage IV decubitus ulcers one in her sacral area and 1 in her ankle.  Blood cultures were taken on admission and have not yielded any organisms.  She was taken to the operating  room yesterday while on vancomycin and ceftriaxone.  So far cultures are unrevealing.  She has acute on chronic renal insufficiency.  She is fairly unresponsive when I came to examine her this morning.  #1 Goals of care: I would strongly recommend a palliative care consult given her multiple comorbidities for large decubitus ulcers which very well could have that hematogenously seeded her joint and which pose a problem in their own right.  #2 static joint infection: For now continue current antibiotics and follow-up cultures   #3  Deep stage IV decubitus ulcer of sacral area and foot:   Consider MRI of these areas but would first have palliative care consult prior to embarking on an expedition to image.  #4  pt has acute on CKD  CKD Staging  Criteria: based on criteria below UpToDate Definition and staging of chronic kidney disease in adults (last updated 05/05/2020):    Definition: Chronic kidney disease (CKD) is defined based on the presence of either kidney damage or decreased kidney function for ? 3 months, irrespective of cause.   CKD Stage 1 - GFR ?90 (Normal or high) CKD Stage 2 - GFR 60 to 89 (Mildly decreased) CKD Stage 3a - GFR 45 to 59 (Mildly to moderately decreased) CKD Stage 3b - GFR 30 to 44 (Moderately to severely decreased) CKD Stage 4 - GFR 15 to 29 (Severely decreased) CKD Stage 5 - GFR <15 End Stage Kidney failure (add D if treated by dialysis)        LOS: 8 days   Alcide Evener 03/05/2021, 11:55 AM

## 2021-03-05 NOTE — Plan of Care (Signed)

## 2021-03-05 NOTE — Progress Notes (Addendum)
Lab called with Hgb @ 5.6, Dr Marlowe Sax notified with new orders to transfuse [facility].  0640 Blood transfusion verified  and started at this time @ 125 ml/hr, will cont to monitor

## 2021-03-06 DIAGNOSIS — A419 Sepsis, unspecified organism: Secondary | ICD-10-CM | POA: Diagnosis not present

## 2021-03-06 DIAGNOSIS — N179 Acute kidney failure, unspecified: Secondary | ICD-10-CM | POA: Diagnosis not present

## 2021-03-06 DIAGNOSIS — D649 Anemia, unspecified: Secondary | ICD-10-CM | POA: Diagnosis not present

## 2021-03-06 DIAGNOSIS — R1310 Dysphagia, unspecified: Secondary | ICD-10-CM

## 2021-03-06 DIAGNOSIS — F0390 Unspecified dementia without behavioral disturbance: Secondary | ICD-10-CM

## 2021-03-06 DIAGNOSIS — R410 Disorientation, unspecified: Secondary | ICD-10-CM | POA: Diagnosis not present

## 2021-03-06 DIAGNOSIS — G9341 Metabolic encephalopathy: Secondary | ICD-10-CM | POA: Diagnosis not present

## 2021-03-06 LAB — HEMOGLOBIN AND HEMATOCRIT, BLOOD
HCT: 21.1 % — ABNORMAL LOW (ref 36.0–46.0)
HCT: 25.3 % — ABNORMAL LOW (ref 36.0–46.0)
Hemoglobin: 6.8 g/dL — CL (ref 12.0–15.0)
Hemoglobin: 8.4 g/dL — ABNORMAL LOW (ref 12.0–15.0)

## 2021-03-06 LAB — BASIC METABOLIC PANEL
Anion gap: 4 — ABNORMAL LOW (ref 5–15)
BUN: 84 mg/dL — ABNORMAL HIGH (ref 8–23)
CO2: 23 mmol/L (ref 22–32)
Calcium: 8.3 mg/dL — ABNORMAL LOW (ref 8.9–10.3)
Chloride: 112 mmol/L — ABNORMAL HIGH (ref 98–111)
Creatinine, Ser: 3.06 mg/dL — ABNORMAL HIGH (ref 0.44–1.00)
GFR, Estimated: 14 mL/min — ABNORMAL LOW (ref >60)
Glucose, Bld: 84 mg/dL (ref 70–99)
Potassium: 4.5 mmol/L (ref 3.5–5.1)
Sodium: 139 mmol/L (ref 135–145)

## 2021-03-06 LAB — RETICULOCYTES
Immature Retic Fract: 32.3 % — ABNORMAL HIGH (ref 2.3–15.9)
RBC.: 2.43 MIL/uL — ABNORMAL LOW (ref 3.87–5.11)
Retic Count, Absolute: 19.4 10*3/uL (ref 19.0–186.0)
Retic Ct Pct: 0.8 % (ref 0.4–3.1)

## 2021-03-06 LAB — CBC WITH DIFFERENTIAL/PLATELET
Abs Immature Granulocytes: 0.44 10*3/uL — ABNORMAL HIGH (ref 0.00–0.07)
Basophils Absolute: 0 10*3/uL (ref 0.0–0.1)
Basophils Relative: 0 %
Eosinophils Absolute: 0.1 10*3/uL (ref 0.0–0.5)
Eosinophils Relative: 1 %
HCT: 21.6 % — ABNORMAL LOW (ref 36.0–46.0)
Hemoglobin: 6.9 g/dL — CL (ref 12.0–15.0)
Immature Granulocytes: 3 %
Lymphocytes Relative: 10 %
Lymphs Abs: 1.3 10*3/uL (ref 0.7–4.0)
MCH: 28 pg (ref 26.0–34.0)
MCHC: 31.9 g/dL (ref 30.0–36.0)
MCV: 87.8 fL (ref 80.0–100.0)
Monocytes Absolute: 0.7 10*3/uL (ref 0.1–1.0)
Monocytes Relative: 6 %
Neutro Abs: 10.9 10*3/uL — ABNORMAL HIGH (ref 1.7–7.7)
Neutrophils Relative %: 80 %
Platelets: 197 10*3/uL (ref 150–400)
RBC: 2.46 MIL/uL — ABNORMAL LOW (ref 3.87–5.11)
RDW: 19.8 % — ABNORMAL HIGH (ref 11.5–15.5)
WBC: 13.5 10*3/uL — ABNORMAL HIGH (ref 4.0–10.5)
nRBC: 0 % (ref 0.0–0.2)

## 2021-03-06 LAB — CK: Total CK: 87 U/L (ref 38–234)

## 2021-03-06 LAB — PREPARE RBC (CROSSMATCH)

## 2021-03-06 MED ORDER — BUMETANIDE 0.25 MG/ML IJ SOLN
2.0000 mg | Freq: Every day | INTRAMUSCULAR | Status: DC
  Filled 2021-03-06: qty 8

## 2021-03-06 MED ORDER — BUMETANIDE 0.25 MG/ML IJ SOLN
1.0000 mg | Freq: Once | INTRAMUSCULAR | Status: AC
  Administered 2021-03-06: 1 mg via INTRAVENOUS
  Filled 2021-03-06: qty 4

## 2021-03-06 MED ORDER — MEGESTROL ACETATE 40 MG PO TABS
40.0000 mg | ORAL_TABLET | Freq: Every day | ORAL | Status: DC
  Filled 2021-03-06 (×2): qty 1

## 2021-03-06 MED ORDER — SODIUM CHLORIDE 0.9% IV SOLUTION: Freq: Once | INTRAVENOUS | Status: AC

## 2021-03-06 NOTE — Progress Notes (Signed)
    Progress Note from the Palliative Medicine Team at [facility]   Patient Name: [name]       Date: 03/06/2021 DOB: [birth year]  Age: 87 y.o. MRN#: [id number] Attending Physician: George Hugh, MD Primary Care Physician: Rosita Fire, MD Admit Date: 03/10/2021   Medical records reviewed   Per intake H&P --> 87 year old female with a PMH of Chronic Dementia, Peripheral Vascular Disease, and multiple medical problems presented to [facility] ED on 4/11 with confusion, weakness, mild AKI (Creatinine 2.53 with baseline ~ 1 mg/dL) and acute swelling and redness of the left knee. Left Knee Arthrocentesis performed by Ortho followed by a left Total Knee Arthroplasty with Radical Synovectomy on 4/16. Patient required 1 unit of PRBC's today for anemia.   Palliative care was asked to get involved to further address goals of care in the setting of ongoing infection(s).   Family face treatment option decisions, advanced directive decisions and anticipatory care needs.  This NP visited patient at the bedside as a follow up to  yesterday's [place].  I met with patients daughters, [name] and [name]  to furtherdiscuss diagnosis prognosis, GOC, EOL wishes, disposition and options.  Patient's sister/[name] and her husband were present at bedside. (Patient has no documented H POA, both daughters act together to make decisions in the patient's best interest)  Continued conversation regarding the seriousness of [name]'s current medical situation; sepsis,  worsening renal function, stage IV sacral wounds, dysphagia and poor oral intake, acute anemia requiring transfusion, dementia based dysphagia  and  overall failure to thrive.  Education and conversation specific the concept of human mortality and adult failure to thrive.  We discussed limitations of medical interventions to prolong quality of life when the body does fail to thrive.  Chaplain services declined at this  time  Education offered on her worsening renal function and the fact that the patient would likely be a poor candidate for dialysis  [name] and [name] both tell me that they have never had any conversation discussing end-of-life wishes with their mother or each other.  [name] speaks to her son's death a year ago   The difference between an aggressive medical intervention path and a palliative comfort path for this patient at this time and the situation was detailed.  Both patient's daughter verbalized their openness to all offered and available medical interventions to prolong life.  "We are hopeful for a miracle".  Emotional support offered.  I discussed with daughters the importance of considering best case scenario versus worst-case scenario.  Discussed with family  the importance of continued conversation with each other and the  medical providers regarding overall plan of care and treatment options,  ensuring decisions are within the context of the patients values and GOCs.  Questions and concerns addressed   Discussed with Dr Lavera Guise  PMT will continue to support holistically  Total time spent on the unit was 60 minutes  Greater than 50% of the time was spent in counseling and coordination of care  Wadie Lessen NP  Palliative Medicine Team Team Phone # [phone] Pager [phone]

## 2021-03-06 NOTE — Progress Notes (Addendum)
Speech Language Pathology Treatment: Dysphagia  Patient Details Name: [name] MRN: [id number] DOB: Aug 15, 1929 Today's Date: 03/06/2021 Time: 1030-1055 SLP Time Calculation (min) (ACUTE ONLY): 25 min  Assessment / Plan / Recommendation Clinical Impression  Called back by attending physician to reassess [name]'s swallowing.  She was sleeping with mouth open; roused after multiple efforts to awaken. Washed her face and attempted to engage her in conversation - she stated that she was from [place], had two daughters, and used to have chickens.  She required a lot of prompting and repetition to "prime the pump" and begin to seal her lips around the spoon/straw, maneuver the material in her mouth, and ultimately initiate a swallow. (It took at least six attempts with teaspoons of water before she initiated the motor pattern necessary to swallow.) She responded yes when asked if she wanted some ice cream, made the request for vanilla, and accepted five bites before she declined further POs.  She demonstrated consistent oral residue after swallowing and intermittent weak coughing. She immediately fell back asleep after I cleaned her mouth and reclined her bed.   [name]'s overall swallowing is consistent with a dementia-based dysphagia: she shows variability in desire to eat, may require a great deal of prompting and encouragement to engage the motor pattern necessary to swallow, and even when she executes the necessary motor pattern, the ability to protect her airway is not reliable. She will likely continue to have a great range of swallowing ability and inherent risk of aspiration. There is little else we can do to improve her swallowing, but we can maximize her safety and enjoyment by offering foods from which she derives pleasure, providing careful hand-feeding, and responding to her communication and non-verbal signals to cease feeding when she is uncomfortable (coughing, gagging) or has had  enough.  D/W RN.  Let our service know if further education with family would be helpful.    Thank you.   HPI HPI: Pt is an 87 y.o. female with medical history significant for hypothyroidism, dementia, GERD, hypertension, PVD and arthritis who presented to the [facility] ED via EMS due to 4-day onset of confusion and weakness. Per report, patient was unable to swallow within past 3 to 4 days prior to admission, she presented with gagging and choking sensation on eating.  Pt was admitted to the hospital from 2/21-3/2 due to left distal femur fracture s/p left distal femoral replacement. Per EMR, pt pt's cognition waxes and wanes from day to day at baseline, but she is typically able to speak and indicate her wants and needs. She was found to have severe sepsis due to lower extremity cellulitis and there was concern for left septic knee arthritis; ortho was consulted and pt was transferred to [facility] for evaluation. Pt diagnosed with severe sepsis, acute renal failure, Enterococcus UTI, hyponatremia, hypokalemia, and acute metabolic encephalopathy. Pt s/p left knee aspiration and injection 4/14 and irrigation and debridement on 4/16.      SLP Plan  Continue with current plan of care       Recommendations  Diet recommendations: Dysphagia 1 (puree);Thin liquid Liquids provided via: Cup;Straw Medication Administration: Crushed with puree Supervision: Trained caregiver to feed patient Compensations: Slow rate;Small sips/bites Postural Changes and/or Swallow Maneuvers: Seated upright 90 degrees                Oral Care Recommendations: Oral care BID;Staff/trained caregiver to provide oral care Follow up Recommendations: 24 hour supervision/assistance SLP Visit Diagnosis:  Dysphagia, unspecified (R13.10) Plan: No further SLP needs identified.       [place] Tivis Ringer, [place] CCC/SLP SLP Clinical Specialist Acute Rehabilitation Services Office number  [phone] Pager [phone]   Juan Quam Laurice 03/06/2021, 11:00 AM

## 2021-03-06 NOTE — Progress Notes (Signed)
OT Cancellation Note  Patient Details Name: [name] MRN: [id number] DOB: May 08, 1929   Cancelled Treatment:    Reason Eval/Treat Not Completed: Other (comment) (OTR waited for palliative to see pt. Per note, pt's family would like alll care to prolong life. OTR to evaluate next available treatment day.)   Jefferey Pica, OTR/L Acute Rehabilitation Services Pager: [phone] Office: [phone]   Thimothy Barretta C 03/06/2021, 4:38 PM

## 2021-03-06 NOTE — Progress Notes (Signed)
Nutrition Follow-up  DOCUMENTATION CODES:   Obesity unspecified  INTERVENTION:  Note, no BM documented since 4/12, recommend initiation of bowel regimen.   Given pt's poor po intake, inability to take supplements, and desire for full scope, recommend placement of Cortrak and initiation of tube feeding. Consider: -Osmolite 1.5 @ 94m/hr, advance 13mhr Q8H until goal rate of 5566mr (1320m64ms reached -45ml75msource TF TID Recommended TF would provide 2100 kcal, 106g protein, 1181ml 37m water  Will leave the following orders in place (ordered by previous RD) in case pt is able to safely take PO: -1 packet Juven BID, each packet provides 95 calories, 2.5 grams of protein (collagen) -Ensure Enlive po TID, each supplement provides 350 kcal and 20 grams of protein -Feeding assistance all meals    NUTRITION DIAGNOSIS:  Increased nutrient needs related to wound healing (3 stage 4 PI and a DTI to left heel) as evidenced by estimated needs. - ongoing  GOAL:  Patient will meet greater than or equal to 90% of their needs - progressing  MONITOR:  PO intake,Supplement acceptance,Labs,Skin,Weight trends  REASON FOR ASSESSMENT:   Malnutrition Screening Tool    ASSESSMENT:  Patient is a 87 yo 56male with presents with multiple wounds chronic stage 4 PI x 2, right ankle stage 4,  left heel DTI. History of PVD, HTN, hypothyroidism.   04/11 diet advanced to dysphagia 2 with thin liquids 04/14 diet changed to soft; s/p L knee aspiration and injection 04/16 s/p I&D L total knee arthroplasty with radical synovectomy and application of negative pressure incisional dressingdiet downgraded to fulls then advanced to heart healthy 04/17 diet downgraded to dysphagia 1 with thin liquids  04/18 5% albumin given  Per Nephrology, renal function worsening and pt not candidate for dialysis  Pt very lethargic upon examination and uninterested in eating. PO Intake: 0-50% x last 8 recorded meals (9%  average meal intake). Pt with orders for Ensure Enlive TID and Juven BID, but has not been able to take it due to lethargy per RN. MD notes pt likely nearing EOL. PMT met with pt's family today and they want to continue with full scope of treatment, thought it is noted that PMT/MD plan to continue [place]rsations daily as pt is more appropriate for a comfort pathway. Given pt's poor po intake, inability to take supplements, and desire for full scope, recommend placement of Cortrak and initiation of tube feeding. Will provide recommendations above.   UOP: 400ml x76mours JP Drain:70ml ou44m x24 hours I/O: +5,223.7ml sinc22mdmit  Note, no BM documented since 4/12, recommend initiation of bowel regimen.   Medications: colace, megace, remeron, mvi with minerals, protonix, senokot-s, sodium bicarbonate BID, vitamin B12 Labs: Cr 3.06 (H, trending up)  Diet Order:   Diet Order            DIET - DYS 1 Room service appropriate? No; Fluid consistency: Thin  Diet effective now                 EDUCATION NEEDS:  Education needs have been addressed  Skin:  Skin Assessment: Skin Integrity Issues: Skin Integrity Issues:: Stage IV Stage IV: 2 sacral stge 4 and a stage 4 to right ankle, DTI left heel  Last BM:  4/12  Height:   Ht Readings from Last 1 Encounters:  02/26/21 5' 4.02" (1.626 m)    Weight:   Wt Readings from Last 1 Encounters:  03/02/21 99 kg   BMI:  Body mass index is 37.44  kg/m.  Estimated Nutritional Needs:   Kcal:  7290-2111  Protein:  103-110 gr  Fluid:  >1500 ml daily   Larkin Ina, MS, RD, LDN RD pager number and weekend/on-call pager number located in [place].

## 2021-03-06 NOTE — Progress Notes (Signed)
Patient not alert enough for PO intake this morning. Per night shift, patient had issues swallowing meds overnight. Attending notified, swallow eval order.

## 2021-03-06 NOTE — Progress Notes (Signed)
Subjective:  Patient is unresponsive   Antibiotics:  Anti-infectives (From admission, onward)   Start     Dose/Rate Route Frequency Ordered Stop   03/05/21 2000  DAPTOmycin (CUBICIN) 500 mg in sodium chloride 0.9 % IVPB        500 mg 120 mL/hr over 30 Minutes Intravenous Every 48 hours 02/24/2021 1517     02/17/2021 2300  cefTRIAXone (ROCEPHIN) 2 g in sodium chloride 0.9 % 100 mL IVPB        2 g 200 mL/hr over 30 Minutes Intravenous Every 24 hours 02/20/2021 2236     03/14/2021 2000  DAPTOmycin (CUBICIN) 500 mg in sodium chloride 0.9 % IVPB  Status:  Discontinued        8 mg/kg  61.3 kg 120 mL/hr over 30 Minutes Intravenous Every 48 hours 03/02/21 0855 02/18/2021 1320   03/10/2021 1500  DAPTOmycin (CUBICIN) 600 mg in sodium chloride 0.9 % IVPB  Status:  Discontinued        6 mg/kg  99 kg 124 mL/hr over 30 Minutes Intravenous Daily 03/05/2021 1400 02/27/2021 1402   02/27/2021 1500  DAPTOmycin (CUBICIN) 500 mg in sodium chloride 0.9 % IVPB  Status:  Discontinued        500 mg 120 mL/hr over 30 Minutes Intravenous Daily 03/13/2021 1405 02/22/2021 1517   02/16/2021 1045  tobramycin (NEBCIN) powder  Status:  Discontinued          As needed 03/13/2021 1045 02/28/2021 1201   02/19/2021 1037  vancomycin (VANCOCIN) powder  Status:  Discontinued          As needed 03/14/2021 1044 02/20/2021 1201   02/18/2021 0800  vancomycin (VANCOCIN) IVPB 1000 mg/200 mL premix        1,000 mg 200 mL/hr over 60 Minutes Intravenous On call to O.R. 03/02/2021 0752 03/01/2021 1040   03/02/21 2200  cefTRIAXone (ROCEPHIN) injection 2 g  Status:  Discontinued        2 g Intramuscular Every 24 hours 03/02/21 0904 03/05/2021 2235   03/02/21 0600  ceFAZolin (ANCEF) IVPB 2g/100 mL premix  Status:  Discontinued        2 g 200 mL/hr over 30 Minutes Intravenous On call to O.R. 03/02/21 0110 02/18/2021 0559   02/27/21 2200  vancomycin (VANCOREADY) IVPB 750 mg/150 mL  Status:  Discontinued        750 mg 150 mL/hr over 60 Minutes Intravenous Every  48 hours 03/11/2021 2342 03/02/21 0855   03/01/2021 2315  vancomycin (VANCOCIN) IVPB 1000 mg/200 mL premix        1,000 mg 200 mL/hr over 60 Minutes Intravenous  Once 02/24/2021 2313 02/26/21 0115   02/18/2021 2315  ceFEPIme (MAXIPIME) 2 g in sodium chloride 0.9 % 100 mL IVPB  Status:  Discontinued        2 g 200 mL/hr over 30 Minutes Intravenous Every 24 hours 03/08/2021 2313 03/02/21 0904      Medications: Scheduled Meds: . Chlorhexidine Gluconate Cloth  6 each Topical Daily  . cilostazol  100 mg Oral BID  . docusate sodium  100 mg Oral BID  . feeding supplement  237 mL Oral TID BM  . levothyroxine  100 mcg Oral Q0600  . mouth rinse  15 mL Mouth Rinse BID  . megestrol  40 mg Oral Daily  . metoprolol tartrate  12.5 mg Oral BID  . mirtazapine  7.5 mg Oral QHS  . multivitamin with minerals  1 tablet Oral  Daily  . nutrition supplement (JUVEN)  1 packet Oral BID BM  . pantoprazole (PROTONIX) IV  40 mg Intravenous Q12H  . senna-docusate  1 tablet Oral QHS  . sodium bicarbonate  1,300 mg Oral BID  . sodium chloride flush  10-40 mL Intracatheter Q12H  . vitamin B-12  1,000 mcg Oral Daily   Continuous Infusions: . cefTRIAXone (ROCEPHIN)  IV 2 g (03/05/21 2313)  . DAPTOmycin (CUBICIN)  IV 500 mg (03/05/21 2117)   PRN Meds:.acetaminophen, alum & mag hydroxide-simeth, haloperidol lactate, menthol-cetylpyridinium **OR** phenol, metoCLOPramide **OR** metoCLOPramide (REGLAN) injection, metoprolol tartrate, ondansetron **OR** ondansetron (ZOFRAN) IV, polyethylene glycol, sodium chloride flush    Objective: Weight change:   Intake/Output Summary (Last 24 hours) at 03/06/2021 1219 Last data filed at 03/06/2021 0700 Gross per 24 hour  Intake 0 ml  Output 470 ml  Net -470 ml   Blood pressure 126/69, pulse 87, temperature 98.4 F (36.9 C), temperature source Axillary, resp. rate 18, height 5' 4.02" (1.626 m), weight 99 kg, SpO2 100 %. Temp:  [98.4 F (36.9 C)-98.7 F (37.1 C)] 98.4 F (36.9 C)  (04/19 1209) Pulse Rate:  [81-88] 87 (04/19 1209) Resp:  [16-18] 18 (04/19 1209) BP: (110-139)/(69-80) 126/69 (04/19 1209) SpO2:  [100 %] 100 % (04/19 1209)  Physical Exam: Physical Exam Constitutional:      General: She is not in acute distress.    Appearance: She is well-developed. She is ill-appearing. She is not diaphoretic.  HENT:     Head: Normocephalic and atraumatic.     Right Ear: External ear normal.     Left Ear: External ear normal.     Mouth/Throat:     Pharynx: No oropharyngeal exudate.  Eyes:     General: No scleral icterus. Cardiovascular:     Rate and Rhythm: Normal rate and regular rhythm.     Heart sounds: Normal heart sounds. No murmur heard. No friction rub. No gallop.   Pulmonary:     Effort: Pulmonary effort is normal. No respiratory distress.     Breath sounds: Normal breath sounds. No wheezing or rales.  Abdominal:     General: Bowel sounds are normal. There is no distension.     Palpations: Abdomen is soft.     Tenderness: There is no abdominal tenderness. There is no rebound.  Musculoskeletal:        General: No tenderness. Normal range of motion.  Lymphadenopathy:     Cervical: No cervical adenopathy.  Skin:    General: Skin is warm and dry.     Coloration: Skin is pale.     Findings: No erythema or rash.  Neurological:     Mental Status: She is disoriented.     Motor: No abnormal muscle tone.     Coordination: Coordination normal.     Knee with dressing in place CBC:    BMET Recent Labs    03/05/21 1831 03/06/21 0305  NA 138 139  K 4.4 4.5  CL 112* 112*  CO2 21* 23  GLUCOSE 107* 84  BUN 85* 84*  CREATININE 2.99* 3.06*  CALCIUM 8.3* 8.3*     Liver Panel  Recent Labs    03/04/21 0519 03/04/21 0740  PROT 5.0*  --   ALBUMIN 1.3* 1.4*  AST 32  --   ALT 9  --   ALKPHOS 48  --   BILITOT 0.8  --        Sedimentation Rate No results for input(s): ESRSEDRATE in the last  72 hours. C-Reactive Protein No results for  input(s): CRP in the last 72 hours.  Micro Results: Recent Results (from the past 720 hour(s))  Blood culture (routine single)     Status: None   Collection Time: 03/17/2021  8:57 PM   Specimen: Right Antecubital; Blood  Result Value Ref Range Status   Specimen Description RIGHT ANTECUBITAL  Final   Special Requests   Final    BOTTLES DRAWN AEROBIC AND ANAEROBIC Blood Culture adequate volume   Culture   Final    NO GROWTH 5 DAYS Performed at [facility], [address] [place], [place] [address]    Report Status 03/02/2021 FINAL  Final  Resp Panel by RT-PCR (Flu A&B, Covid) Nasopharyngeal Swab     Status: None   Collection Time: 03/04/2021  9:00 PM   Specimen: Nasopharyngeal Swab; Nasopharyngeal(NP) swabs in vial transport medium  Result Value Ref Range Status   SARS Coronavirus 2 by RT PCR NEGATIVE NEGATIVE Final    Comment: (NOTE) SARS-CoV-2 target nucleic acids are NOT DETECTED.  The SARS-CoV-2 RNA is generally detectable in upper respiratory specimens during the acute phase of infection. The lowest concentration of SARS-CoV-2 viral copies this assay can detect is 138 copies/mL. A negative result does not preclude SARS-Cov-2 infection and should not be used as the sole basis for treatment or other patient management decisions. A negative result may occur with  improper specimen collection/handling, submission of specimen other than nasopharyngeal swab, presence of viral mutation(s) within the areas targeted by this assay, and inadequate number of viral copies(<138 copies/mL). A negative result must be combined with clinical observations, patient history, and epidemiological information. The expected result is Negative.  Fact Sheet for Patients:  EntrepreneurPulse.com.au  Fact Sheet for Healthcare Providers:  IncredibleEmployment.be  This test is no t yet approved or cleared by the [place] FDA and  has been authorized for detection  and/or diagnosis of SARS-CoV-2 by FDA under an Emergency Use Authorization (EUA). This EUA will remain  in effect (meaning this test can be used) for the duration of the COVID-19 declaration under Section 564(b)(1) of the Act, 21 U.S.C.section 360bbb-3(b)(1), unless the authorization is terminated  or revoked sooner.       Influenza A by PCR NEGATIVE NEGATIVE Final   Influenza B by PCR NEGATIVE NEGATIVE Final    Comment: (NOTE) The Xpert Xpress SARS-CoV-2/FLU/RSV plus assay is intended as an aid in the diagnosis of influenza from Nasopharyngeal swab specimens and should not be used as a sole basis for treatment. Nasal washings and aspirates are unacceptable for Xpert Xpress SARS-CoV-2/FLU/RSV testing.  Fact Sheet for Patients: EntrepreneurPulse.com.au  Fact Sheet for Healthcare Providers: IncredibleEmployment.be  This test is not yet approved or cleared by the [place] FDA and has been authorized for detection and/or diagnosis of SARS-CoV-2 by FDA under an Emergency Use Authorization (EUA). This EUA will remain in effect (meaning this test can be used) for the duration of the COVID-19 declaration under Section 564(b)(1) of the Act, 21 U.S.C. section 360bbb-3(b)(1), unless the authorization is terminated or revoked.  Performed at [facility], [address], [place], [place] [address]   Urine culture     Status: Abnormal   Collection Time: 02/24/2021 11:57 PM   Specimen: In/Out Cath Urine  Result Value Ref Range Status   Specimen Description   Final    IN/OUT CATH URINE Performed at [facility], [address] [place], [place] [address]    Special Requests   Final  NONE Performed at [facility], [address] [place],  [address]    Culture 80,000 COLONIES/mL ENTEROCOCCUS AVIUM (A)  Final   Report Status 03/01/2021 FINAL  Final   Organism ID, Bacteria ENTEROCOCCUS AVIUM (A)  Final      Susceptibility   Enterococcus  avium - MIC*    AMPICILLIN 16 RESISTANT Resistant     NITROFURANTOIN <=16 SENSITIVE Sensitive     VANCOMYCIN <=0.5 SENSITIVE Sensitive     * 80,000 COLONIES/mL ENTEROCOCCUS AVIUM  Body fluid culture w Gram Stain     Status: None   Collection Time: 03/01/21 11:02 AM   Specimen: Body Fluid  Result Value Ref Range Status   Specimen Description FLUID  Final   Special Requests SYNOVIAL LEFT KNEE  Final   Gram Stain   Final    ABUNDANT WBC PRESENT, PREDOMINANTLY MONONUCLEAR ABUNDANT GRAM POSITIVE COCCI IN PAIRS IN CHAINS    Culture   Final    NO GROWTH 3 DAYS Performed at [facility], [place] [address] [place],  [address]    Report Status 03/05/2021 FINAL  Final    Studies/Results: CT ABDOMEN PELVIS WO CONTRAST  Result Date: 03/06/2021 CLINICAL DATA:  87 year old female with history of renal mass. Renal insufficiency. CT the abdomen EXAM: CT ABDOMEN AND PELVIS WITHOUT CONTRAST TECHNIQUE: Multidetector CT imaging of the abdomen and pelvis was performed following the standard protocol without IV contrast. COMPARISON:  CT the abdomen and pelvis 04/16/2009. FINDINGS: Comment: Today's study is limited for detection and characterization of visceral and/or vascular lesions by lack of IV contrast. Lower chest: Small bilateral pleural effusions with what appears to be areas of dependent subsegmental atelectasis in the lower lobes of the lungs bilaterally. Atherosclerotic calcifications in the descending thoracic aorta as well as the left anterior descending, left circumflex and right coronary arteries. Hepatobiliary: No suspicious cystic or solid hepatic lesions are confidently identified on today's noncontrast CT examination. Unenhanced appearance of the gallbladder is unremarkable. Pancreas: No definite pancreatic mass or peripancreatic fluid collections or inflammatory changes are noted on today's noncontrast CT examination. Spleen: Unremarkable. Adrenals/Urinary Tract: No definite renal  mass confidently identified on today's noncontrast CT examination. No hydroureteronephrosis. Urinary bladder is decompressed around an indwelling Foley balloon catheter. Bilateral adrenal glands are normal in appearance. Stomach/Bowel: Unenhanced appearance of the stomach is normal. No pathologic dilatation of small bowel or colon. Status post appendectomy. Vascular/Lymphatic: Aortic atherosclerosis. No lymphadenopathy noted in the abdomen or pelvis. Reproductive: Status post hysterectomy.  Ovaries are a trophic. Other: No significant volume of ascites.  No pneumoperitoneum. Musculoskeletal: Diffuse body wall edema. There are no aggressive appearing lytic or blastic lesions noted in the visualized portions of the skeleton. IMPRESSION: 1. Limited study secondary to lack of IV contrast. No renal mass confidently identified on today's noncontrast examination. 2. Diffuse body wall edema and small bilateral pleural effusions; imaging findings that may suggest a state of anasarca. 3. Aortic atherosclerosis, in addition to at least 3 vessel coronary artery disease. 4. Additional incidental findings, as above. Electronically Signed   By: Vinnie Langton M.D.   On: 03/06/2021 08:07   US RENAL  Result Date: 03/04/2021 CLINICAL DATA:  AK I EXAM: RENAL / URINARY TRACT ULTRASOUND COMPLETE COMPARISON:  CT abdomen pelvis 04/16/2009 FINDINGS: Right Kidney: Renal measurements: 8.7 x 4.3 x 5.7 cm = volume: 113 mL. Echogenicity is increased. There is a solid mass in the inferior pole measuring 2.4 x 2.8 x 2.9 cm. No hydronephrosis. No shadowing renal  calculi. Left Kidney: Renal measurements: 7.6 x 3.3 x 4.1 cm = volume: 54 mL. Echogenicity within normal limits. No mass or hydronephrosis visualized. Bladder: Decompressed with Foley catheter in place. Other: None. IMPRESSION: 1. There is a solid lesion in the inferior pole the right kidney measuring 2.9 cm. Further evaluation with renal protocol CT or MRI is recommended. 2.  Increased echogenicity of the right kidney as can be seen in medical renal disease. 3.  Unremarkable sonographic appearance of the left kidney. Electronically Signed   By: Audie Pinto M.D.   On: 03/04/2021 15:42      Assessment/Plan:  INTERVAL HISTORY: Cultures in the operating room were unrevealing so far  Principal Problem:   Severe Sepsis due to left leg cellulitis and possible Lt septic knee and Enterococcus UTI Active Problems:   Essential hypertension   PERIPHERAL VASCULAR DISEASE   GERD   Hypothyroidism   AKI (acute kidney injury) ([place])   Pressure injury of skin   Generalized weakness   Acute metabolic encephalopathy   Leukocytosis   Hyponatremia   Dehydration   Hypoalbuminemia   Elevated brain natriuretic peptide (BNP) level   Paroxysmal A-fib (HCC)   Altered mental status   Septic infrapatellar bursitis of left knee   Infection of total knee replacement (HCC)   Medication monitoring encounter    [name] is a 87 y.o. female with history of recent distal femur fracture status post still femoral replacement discharged to skilled nursing facility but readmitted now with periprosthetic joint infection as well as to stage IV decubitus ulcers one in her sacral area and 1 in her ankle.  Blood cultures were taken on admission and have not yielded any organisms.  She was taken to the operating room  while on vancomycin and ceftriaxone.  Cultures did not yield any organisms though the gram stain showed gram-positive cocci in pairs and chains  She has acute on chronic renal insufficiency (latter is obvious from her prior elevated creatinines  Remains fairly obtunded  #1 Goals of care: Greatly appreciate palliative care seeing her.  In the interest of providing compassion it would be better to change directions from the aggressive course she is currently on to 1 focus more on comfort.  Given her extensive decubitus ulcers which are due to her bedbound status  she is likely to return to the hospital over and over again and likely to seed her bloodstream and joints as has likely happened with her prosthetic knee infection  #2  Prosthetic knee infection: Continue daptomycin and ceftriaxone for now.   #3  Deep stage IV decubitus ulcer of sacral area and foot:  1 could obtain MRIs of the pelvis and right ankle.  However my understanding is with her wound vacuum in place the studies cannot be obtained.   #4 acute on chronic renal failure: Dr Justin Mend is following. Patient not making much urine.She has largely been off vancomycin since I first saw her though I do believe her cement from surgery contains vancomycin and tobramycin.           LOS: 9 days   Alcide Evener 03/06/2021, 12:19 PM

## 2021-03-06 NOTE — Progress Notes (Signed)
PHARMACY CONSULT NOTE FOR:  OUTPATIENT  PARENTERAL ANTIBIOTIC THERAPY (OPAT)  Indication: Septic knee/sacral decubitus ulcer Regimen: Daptomycin 500 mg IV Q 24 hours + Ceftriaxone 2 gm IV Q 24 hours  End date: 04/13/21  IV antibiotic discharge orders are pended. To discharging provider:  please sign these orders via discharge navigator,  Select New Orders & click on the button choice - Manage This Unsigned Work.     Thank you for allowing pharmacy to be a part of this patient's care.  Jimmy Footman, PharmD, BCPS, BCIDP Infectious Diseases Clinical Pharmacist Phone: [phone] 03/06/2021, 2:24 PM

## 2021-03-06 NOTE — Progress Notes (Signed)
Critical lab value,Hemoglobin is 6.9 text page provider V. Rathore, MD.waiting for response

## 2021-03-06 NOTE — Consult Note (Addendum)
[facility] Consult: 12:37 PM 03/06/2021  LOS: 9 days    Referring Provider: Dr George Hugh  Primary Care Physician:  Rosita Fire, MD Primary Gastroenterologist: [facility]. Dr Sydell Axon.      Reason for Consultation: Acute, transfusion requiring anemia.   HPI: [name] is a 87 y.o. female.  PMH hypertension.  Hypothyroidism.  Peripheral vascular disease.  Arthritis.  Left ear skin cancer.  Dementia bilateral knee replacement.  Carpal tunnel surgery.  Abdominal hysterectomy.  Cervical spine fusion.  03/2009 EGD.  Normal esophagus, no Barrett's.  H. pylori positive moderate gastritis.  Treated with Pylera. 03/2009 colonoscopy.  Large, non-malignant cecal mass.  Pathology: Inflamed granulation tissues with a few surface cells with atypia. 05/2006 Right hemicolectomy.  Dr. Georgette Dover.  Pathology revealed ulcerated colonic mucosa with associated granulation tissue and reactive changes, no malignancy.  Viable resection margins.  21 lymph nodes all negative for neoplasm.  No appendiceal abnormality C. difficile diarrhea in 05/2013.  Admission in late February after fracturing distal femur, underwent 01/14/2021 left distal femur replacement.  Discharged to SNF but then went home after that. Admitted to [facility] over a week ago.  Presented with confusion, weakness for several days.  Somnolence, less responsive.  Gagging, choking with PO with observed difficulty swallowing.  Stage 4 sacral decubitus ulcers During the course of the hospitalization she has been seen by renal for oliguric AKI.  ID following for severe sepsis associated with left leg cellulitis and possible left septic knee, enterococcal UTI.  Current antibiotics of Rocephin and daptomycin. Palliative care consult on 4/18 resulting in family decision to continue  aggressive care, full code status.   SLP eval: Overall swallowing is consistent with dementia based dysphagia, shows variability and desire to eat, may require a great deal of prompting and encouragement to engage motor pattern necessary to swallow.  Even when she executes necessary motor pattern, ability to protect airway not reliable.  Likely to continue having great range of swallowing ability and inherent risk of aspiration.  Little can be done to improve swallowing but can maximize safety and enjoyment of foods by offering foods she likes, careful hand feeding, responding to communication and nonverbal symbols to see his feeding when she is uncomfortable, coughing, gagging or appears to have had enough to eat. Mental status has not improved, remains disoriented, minimally responsive.  Asked to see patient regarding normocytic anemia.  No overt bleeding, i.e. no coffee-ground or bloody emesis.  No bloody or black stools.  Stools have not been sent for FOBT.  No observed abdominal tenderness or pain.  No nausea or vomiting.  Hgb ranged 7.7 to 12.2 during late Feb admission. 8.3 on 01/17/21.  Received 2 PRBCs on 01/11/2021 Hgb this admission: 10.2 >> 5.6 (4/18) >> 7 >> 6.8 today. MCV high 80s to mid 90s.  Platelets normal. WBCs 21.4 >> 13.5. INR 1.3. 03/05/2021 CTAP Noncon: Diffuse body wall edema s/o anasarca, small bilateral pleural effusions.  Aortic atherosclerosis in addition to at least three-vessel CAD.  No renal mass.  No ascites.  Liver, pancreas,  stomach, small and large bowel unremarkable other than status post appendectomy.        Past Medical History:  Diagnosis Date  . Arthritis   . Gastritis, Helicobacter pylori    Treated with Pylera, 2010  . Hypertension   . Hypothyroidism     Past Surgical History:  Procedure Laterality Date  . ABDOMINAL HYSTERECTOMY    . CARPAL TUNNEL RELEASE    . CERVICAL FUSION    . COLON SURGERY  2010   Dr. Georgette Dover: secondary to cecal mass. Negative  for malignancy  . COLONOSCOPY  May 2010   Dr. Oneida Alar: large cecal mass, referred to Dr. Georgette Dover  . ESOPHAGOGASTRODUODENOSCOPY  May 2010   normal esophagus, no Barrett's, moderate gastritis, +H.PYLORI  . I & D KNEE WITH POLY EXCHANGE Left 03/04/2021   Procedure: IRRIGATION AND DEBRIDEMENT KNEE WITH POLY EXCHANGE;  Surgeon: Rod Can, MD;  Location: [place];  Service: Orthopedics;  Laterality: Left;  . JOINT REPLACEMENT     bilateral knee replacement, left shoulder surgery  . TOTAL KNEE REVISION Left 01/14/2021   Procedure: LEFT DISTAL FEMUR REPLACEMENT;  Surgeon: Rod Can, MD;  Location: [place];  Service: Orthopedics;  Laterality: Left;    Prior to Admission medications   Medication Sig Start Date End Date Taking? Authorizing Provider  acetaminophen (TYLENOL) 325 MG tablet Take 2 tablets (650 mg total) by mouth every 6 (six) hours as needed for mild pain (or Fever >/= 101). 01/17/21  Yes Geradine Girt, DO  ALPRAZolam (XANAX) 0.5 MG tablet Take 0.5 mg by mouth daily as needed. 02/14/21  Yes [provider]  cilostazol (PLETAL) 100 MG tablet Take 100 mg by mouth 2 (two) times daily.   Yes [provider]  felodipine (PLENDIL) 10 MG 24 hr tablet Take 10 mg by mouth daily.   Yes [provider]  furosemide (LASIX) 40 MG tablet Take 40 mg by mouth daily. 01/29/21  Yes [provider]  lansoprazole (PREVACID) 30 MG capsule Take 30 mg by mouth daily.   Yes [provider]  metoprolol tartrate (LOPRESSOR) 25 MG tablet Take 1 tablet (25 mg total) by mouth 2 (two) times daily. 01/17/21  Yes Eulogio Bear U, DO  potassium chloride SA (KLOR-CON) 20 MEQ tablet Take 20 mEq by mouth daily.   Yes [provider]  SYNTHROID 100 MCG tablet Take 100 mcg by mouth every morning. 01/29/21  Yes [provider]  aspirin 81 MG chewable tablet Chew 1 tablet (81 mg total) by mouth 2 (two) times daily with a meal. 03/05/21 04/19/21  Cherlynn June B, PA   HYDROcodone-acetaminophen (NORCO/VICODIN) 5-325 MG tablet Take 1 tablet by mouth every 4 (four) hours as needed for up to 7 days for moderate pain (pain score 4-6). 03/05/21 03/12/21  Cherlynn June B, PA    Scheduled Meds: . Chlorhexidine Gluconate Cloth  6 each Topical Daily  . cilostazol  100 mg Oral BID  . docusate sodium  100 mg Oral BID  . feeding supplement  237 mL Oral TID BM  . levothyroxine  100 mcg Oral Q0600  . mouth rinse  15 mL Mouth Rinse BID  . megestrol  40 mg Oral Daily  . metoprolol tartrate  12.5 mg Oral BID  . mirtazapine  7.5 mg Oral QHS  . multivitamin with minerals  1 tablet Oral Daily  . nutrition supplement (JUVEN)  1 packet Oral BID BM  . pantoprazole (PROTONIX) IV  40 mg Intravenous Q12H  .  senna-docusate  1 tablet Oral QHS  . sodium bicarbonate  1,300 mg Oral BID  . sodium chloride flush  10-40 mL Intracatheter Q12H  . vitamin B-12  1,000 mcg Oral Daily   Infusions: . cefTRIAXone (ROCEPHIN)  IV 2 g (03/05/21 2313)  . DAPTOmycin (CUBICIN)  IV 500 mg (03/05/21 2117)   PRN Meds: acetaminophen, alum & mag hydroxide-simeth, haloperidol lactate, menthol-cetylpyridinium **OR** phenol, metoCLOPramide **OR** metoCLOPramide (REGLAN) injection, metoprolol tartrate, ondansetron **OR** ondansetron (ZOFRAN) IV, polyethylene glycol, sodium chloride flush   Allergies as of 02/24/2021 - Review Complete 03/04/2021  Allergen Reaction Noted  . Amoxicillin Other (See Comments) 12/23/2006  . Penicillins Other (See Comments) 12/23/2006    Family History  Problem Relation Age of Onset  . Colon cancer Neg Hx     Social History   Socioeconomic History  . Marital status: Widowed    Spouse name: Not on file  . Number of children: Not on file  . Years of education: Not on file  . Highest education level: Not on file  Occupational History  . Not on file  Tobacco Use  . Smoking status: Never Smoker  . Smokeless tobacco: Never Used  Substance and Sexual Activity  .  Alcohol use: No  . Drug use: No  . Sexual activity: Never  Other Topics Concern  . Not on file  Social History Narrative   Widowed, 2 daughters    Lives in [place]   Denies EtOH, smoking    Social Determinants of Health   Financial Resource Strain: Not on file  Food Insecurity: Not on file  Transportation Needs: Not on file  Physical Activity: Not on file  Stress: Not on file  Social Connections: Not on file  Intimate Partner Violence: Not on file    REVIEW OF SYSTEMS: Constitutional: Lethargic at times.  Bedbound. ENT:  No nose bleeds Pulm: No difficulty breathing or cough CV:  No palpitations, no LE edema.  GU:  No hematuria, no frequency GI: See HPI Heme: No excessive or unusual bleeding or bruising. Transfusions: 2 PRBCs on 2/24, none since then or before that. Neuro:  No headaches, no peripheral tingling or numbness.  No seizures, no syncope. Derm:  No itching, no rash or sores.  Endocrine:  No sweats or chills.  No polyuria or dysuria Immunization: Not queried.  There is no vaccinations in the epic record. Travel:  None beyond local counties in last few months.    PHYSICAL EXAM: Vital signs in last 24 hours: Vitals:   03/06/21 1209 03/06/21 1226  BP: 126/69 130/62  Pulse: 87 92  Resp: 18 18  Temp: 98.4 F (36.9 C) 98.2 F (36.8 C)  SpO2: 100% 100%   Wt Readings from Last 3 Encounters:  03/02/21 99 kg  01/17/21 63.6 kg  04/29/14 84.4 kg   Patient's sister, brother-in-law and 2 daughters were in attendance in the room during the exam.  Daughter is trying to feed the patient the pured diet. General: Obese, alert, altered elderly WF. Head: No signs of head trauma or facial asymmetry. Eyes: Conjunctiva pale.  No scleral icterus. Ears: No obvious hearing deficit.  Responds to voice minimally Nose: No congestion or discharge Mouth: Piece of a peach which had been fed to the patient within the last few minutes is sitting at the front of her  mouth. Neck: No JVD, no masses, no thyromegaly Lungs: No labored breathing or cough.  Lungs with diminished breath sounds but clear. Heart: RRR, rate  80 on telemetry monitor. Abdomen: Nontender, soft, obese.  No HSM, masses, bruits, hernias.   Rectal: Deferred. Musc/Skeltl: Long neoprene knee brace extends from the upper thigh down to the ankle.  This was not removed for the exam. Extremities: Nonpitting pedal edema. Neurologic: Eyes are open, does not speak but does grunt and shake her head up down, side to side.  No tremors.  Moves her arms without obvious tremor.  Family trying to feed the patient but she does not want to take anything else by mouth right now. Skin: No telangiectasia, no sores, no bruising but was not able to look at the left leg underneath the brace. Nodes: No cervical adenopathy Psych: Calm.  Not verbal  Intake/Output from previous day: 04/18 0701 - 04/19 0700 In: 300 [Blood:300] Out: 470 [Urine:400; Drains:70] Intake/Output this shift: No intake/output data recorded.  LAB RESULTS: Recent Labs    03/05/21 0323 03/05/21 1126 03/06/21 0305 03/06/21 1010  WBC 17.6* 15.6* 13.5*  --   HGB 5.6* 7.0* 6.9* 6.8*  HCT 17.5* 21.4* 21.6* 21.1*  PLT 182 187 197  --    BMET Lab Results  Component Value Date   NA 139 03/06/2021   NA 138 03/05/2021   NA 142 03/05/2021   K 4.5 03/06/2021   K 4.4 03/05/2021   K 4.6 03/05/2021   CL 112 (H) 03/06/2021   CL 112 (H) 03/05/2021   CL 113 (H) 03/05/2021   CO2 23 03/06/2021   CO2 21 (L) 03/05/2021   CO2 21 (L) 03/05/2021   GLUCOSE 84 03/06/2021   GLUCOSE 107 (H) 03/05/2021   GLUCOSE 96 03/05/2021   BUN 84 (H) 03/06/2021   BUN 85 (H) 03/05/2021   BUN 85 (H) 03/05/2021   CREATININE 3.06 (H) 03/06/2021   CREATININE 2.99 (H) 03/05/2021   CREATININE 2.93 (H) 03/05/2021   CALCIUM 8.3 (L) 03/06/2021   CALCIUM 8.3 (L) 03/05/2021   CALCIUM 8.9 03/05/2021   LFT Recent Labs    03/04/21 0519 03/04/21 0740  PROT 5.0*   --   ALBUMIN 1.3* 1.4*  AST 32  --   ALT 9  --   ALKPHOS 48  --   BILITOT 0.8  --    PT/INR Lab Results  Component Value Date   INR 1.3 (H) 02/26/2021   INR 1.2 03/10/2021   INR 1.2 01/10/2021   Hepatitis Panel No results for input(s): HEPBSAG, HCVAB, HEPAIGM, HEPBIGM in the last 72 hours. C-Diff No components found for: CDIFF Lipase  No results found for: LIPASE  Drugs of Abuse  No results found for: LABOPIA, COCAINSCRNUR, LABBENZ, AMPHETMU, THCU, LABBARB   RADIOLOGY STUDIES: CT ABDOMEN PELVIS WO CONTRAST  Result Date: 03/06/2021 CLINICAL DATA:  87 year old female with history of renal mass. Renal insufficiency. CT the abdomen EXAM: CT ABDOMEN AND PELVIS WITHOUT CONTRAST TECHNIQUE: Multidetector CT imaging of the abdomen and pelvis was performed following the standard protocol without IV contrast. COMPARISON:  CT the abdomen and pelvis 04/16/2009. FINDINGS: Comment: Today's study is limited for detection and characterization of visceral and/or vascular lesions by lack of IV contrast. Lower chest: Small bilateral pleural effusions with what appears to be areas of dependent subsegmental atelectasis in the lower lobes of the lungs bilaterally. Atherosclerotic calcifications in the descending thoracic aorta as well as the left anterior descending, left circumflex and right coronary arteries. Hepatobiliary: No suspicious cystic or solid hepatic lesions are confidently identified on today's noncontrast CT examination. Unenhanced appearance of the gallbladder is unremarkable. Pancreas: No  definite pancreatic mass or peripancreatic fluid collections or inflammatory changes are noted on today's noncontrast CT examination. Spleen: Unremarkable. Adrenals/Urinary Tract: No definite renal mass confidently identified on today's noncontrast CT examination. No hydroureteronephrosis. Urinary bladder is decompressed around an indwelling Foley balloon catheter. Bilateral adrenal glands are normal in  appearance. Stomach/Bowel: Unenhanced appearance of the stomach is normal. No pathologic dilatation of small bowel or colon. Status post appendectomy. Vascular/Lymphatic: Aortic atherosclerosis. No lymphadenopathy noted in the abdomen or pelvis. Reproductive: Status post hysterectomy.  Ovaries are a trophic. Other: No significant volume of ascites.  No pneumoperitoneum. Musculoskeletal: Diffuse body wall edema. There are no aggressive appearing lytic or blastic lesions noted in the visualized portions of the skeleton. IMPRESSION: 1. Limited study secondary to lack of IV contrast. No renal mass confidently identified on today's noncontrast examination. 2. Diffuse body wall edema and small bilateral pleural effusions; imaging findings that may suggest a state of anasarca. 3. Aortic atherosclerosis, in addition to at least 3 vessel coronary artery disease. 4. Additional incidental findings, as above. Electronically Signed   By: Vinnie Langton M.D.   On: 03/06/2021 08:07   US RENAL  Result Date: 03/04/2021 CLINICAL DATA:  AK I EXAM: RENAL / URINARY TRACT ULTRASOUND COMPLETE COMPARISON:  CT abdomen pelvis 04/16/2009 FINDINGS: Right Kidney: Renal measurements: 8.7 x 4.3 x 5.7 cm = volume: 113 mL. Echogenicity is increased. There is a solid mass in the inferior pole measuring 2.4 x 2.8 x 2.9 cm. No hydronephrosis. No shadowing renal calculi. Left Kidney: Renal measurements: 7.6 x 3.3 x 4.1 cm = volume: 54 mL. Echogenicity within normal limits. No mass or hydronephrosis visualized. Bladder: Decompressed with Foley catheter in place. Other: None. IMPRESSION: 1. There is a solid lesion in the inferior pole the right kidney measuring 2.9 cm. Further evaluation with renal protocol CT or MRI is recommended. 2. Increased echogenicity of the right kidney as can be seen in medical renal disease. 3.  Unremarkable sonographic appearance of the left kidney. Electronically Signed   By: Audie Pinto M.D.   On: 03/04/2021  15:42      IMPRESSION:   *   Sepsis from infected knee following 01/14/2021 left distal femur replacement following fracture.  Left lower extremity cellulitis, enterococcal UTI.  On Rocephin, daptomycin.  *    Anemia.  Acute on chronic. Multifactorial.  No gross GI bleeding. S/p 2 PRBCs pre femur surgery in Feb 2022.    Dr. Justin Mend does not recommend iron in the setting of severe infection, agrees with as needed transfusion  *    Oliguric AKI.  NAGMA.  Progressive rise of BUN/creatinine.  GFR has gone from max 24 >> 14 currently.  Receiving sodium bicarb twice daily.  Contrasted CT in 2010 revealed ultrasound raised concern for right, 2.9 cm kidney lesion.  No renal mass or issues with kidneys on noncontrasted CT.  Renal, Dr. Edrick Oh following.  *   Dementia based dysphagia.  Risk of aspiration. Observed family trying to feed the patient today and the patient did not really want to eat plus there was a piece of peach lingering at the front of the patient's mouth  *   Large, nonmalignant cecal mass 2010, s/p right hemicolectomy.    PLAN:     *   Per Dr Silverio Decamp.     Azucena Freed  03/06/2021, 12:37 PM Phone [phone]   Attending physician's note   I have taken a history, examined the patient and reviewed the chart.  I agree with the Advanced Practitioner's note, impression and recommendations.  87 year old female with severe dementia, peripheral vascular disease admitted after fracture of distal femur in February 2022 Stage IV decubitus ulcer  Transfusion dependent anemia  No sign of overt GI bleed, no melena or hematochezia  Please evaluate for non-GI source of blood loss, ?  Hemolysis or RP bleed Consider hematology consult  Do not recommend endoscopic evaluation due to lack of overt sign of GI bleeding, overall will be low yield and there is potential complication associated with procedure and anesthesia  Continue to discuss goals of care Please consult GI back if  needed if she develops signs of GI bleeding    K. Denzil Magnuson , MD [phone]

## 2021-03-06 NOTE — Progress Notes (Signed)
PROGRESS NOTE    [name]  OZD:[id number] DOB: [birth year] DOA: 02/24/2021 PCP: [name] Fire, MD   Chief Complaint  Patient presents with  . Weakness  Brief Narrative: 87 year old female with a PMH of Chronic Dementia, Peripheral Vascular Disease, and multiple medical problems presented to [facility] ED on 4/11 with confusion, weakness, mild AKI (Creatinine 2.53 with baseline ~ 1 mg/dL) and acute swelling and redness of the left knee.    On 2/27 - patient sustained a periprosthetic femur fracture s/p mechanical fall and underwent a Left Femur Replacement performed by Dr. Lyla Poole.     On 4/14 - Left Knee Arthrocentesis performed by Ortho.  WBC was 100K.  Cultures are growing GPCs.  On 4/16 - I&D, Left Total Knee Arthroplasty with Radical Synovectomy performed by Ortho Dr. Lyla Poole.  Intraoperative cultures sent.  On 4/18 - Hb was 5.5 g/dL, received 1 unit pRBC  4/14 Blood Culture: NG 4/14 Urine Culture: Enterococcus Avium  Subjective: Patient's mental status is worse today. She is drowsy, can only take sips of fluid, and is unable to swallow pills. She does have some secretions as well. I believe she may be nearing end of life. I had long conversation with family and palliative team in the afternoon.  We discussed goals of care and advocated for comfort measures.  We discussed preferred place of death.   Family is not ready to make any decisions and would like all measures taken including Full Code. I asked daughter what her mother would want and she stated, "she would want you to do everything." I reassured family that we will continue full supportive care.  Assessment & Plan:  Acute Anemia - postop blood loss in the setting of AOCKD:  - 4/18 1 unit pRBC. - 4/19 1 unit pRBC - Patient was on Aspirin 81 mg daily prior to acute anemia.  She remains tachycardic on beta blocker, has worsening renal function, and reticulocyte is 32%.  There is no active bleeding on exam.   However, I will consult GI for an evaluation, appreciate.   - Check FOBT. - Continue IV Protonix 40 mg BID.  Left Knee Prosthetic Joint Infection s/p 4/16 Resection Arthroplasty and Radical Synovectomy: - 4/14 aspiration cultures show GPCs in pairs and in chains. - Follow up on 4/16 intraoperative aerobic and anaerobic culture, gram stain, and histopathology.  Patient likely has bone infection in stem or hardware interface thus joint fluid may return negative.  Ortho is following, appreciate. - On Day 4 of Daptomycin 6 mg/kg QD and Ceftriaxone 2g QD.  Patient will need at least 6 weeks of IV therapy followed by oral drug therapy based on susceptibilities plus Rifampin 300-450 mg PO BID.  Infectious Disease is following, appreciate. - Monitor LFTs and CK weekly while on Daptomycin. - On 4/17, PICC line was placed.  Patient will need home IV antibiotics organized.  This was discussed with CM.  Leukocytosis: - WBC has decreased to 13K.  This is due to ongoing infection.   - Monitor daily.  Left Lower Extremity Non-Purulent Cellulitis: - Check MRI of ankle to evaluate for osteomyelitis. - Continue antibiotics as above.   Stage 4 Sacral Ulcer, POA: - Check MRI of sacrum to evaluate for osteomyelitis. - Continue antibiotics as above.   Enterococcus Avium UTI: 4/14 Urine cultures are resistant to Ampicillin and sensitive to Vancomycin. - Completed 7 days of Vancomycin.  Acute Kidney Injury with mild metabolic acidosis: - Creatinine increased from 2 to 3 mg/dL over  the last few days.  Baseline ~ 1.1 mg/dL. - I/O (+)6L since admission.  UO is 300 CCs x 24 hours. - I will give IV Bumex 1 mg x once and reassess in am. - 4/18 Abdominal CT shows no obstructive uropathy and no renal mass that was reported on ultrasound. - I did not recommend dialysis for [name].  This will be discussed with Nephrology tomorrow. - Nephrology is following, appreciate.  Acute Toxic Metabolic Encephalopathy in  the setting of Chronic Dementia - this is due to acute infection in the setting of worsening dementia: - Mental status is worsening.  Peripheral Vascular Disease: - Hold Aspirin as patient is profoundly anemic.  Paroxysmal Atrial Fibrillation: - Continue Metoprolol for rate control. - Hold anticoagulation due to high bleeding risk. - Monitor on telemetry.  Grade 1 Diastolic Heart Failure: 6/06 Echo showed Grade 1 DD and normal EF. - Echo also showed small LV cavity with hyperdynamic function.  Continue beta blocker for afterload reduction.    Physical Deconditioning: - PT initially recommended SNF. - Now that she has acutely declined, we will look at hospice options.  Severe Protein Calorie Malnutrition: - Albumin is 1.4 mmol/L.  Neurological Oropharyngeal Dysphagia: - Patient can tolerate minimal liquids with prompting and encouragement, but cannot swallow pills. - She has a weak cough reflex and multiple secretions. - Swallowing difficulties are a common sign of the end of life.  Patient currently has a weak cough reflex and multiple secretions without drooling.  The dysphagia is secondary to worsening neurological dementia and loss of swallowing muscle mass both acutely worsened by severe infection.   - We recommended comfort measures to the family today, but they refused. - We will continue daily discussions regarding comfort care and code status, work with SLP regarding oral intake, and maintain aspiration precautions. - I did not recommend PEG tube for [name].  Chronic Anemia - B12 and IDA: HH is stable. Essential Hypertension: BP is stable. Hold off on meds. GERD: PPI Hypothyroidism: Synthroid 2010 Cecal Mass s/p right hemicolectomy  Goals of Care: Given worsening renal failure, ongoing anemia, poor nutritional status with albumin of 1.4 mmol/L, neurological dysphagia, large sacral wounds, and multiple complicated infections requiring lifelong antibiotics - we recommend  comfort care measures.  We will continue daily discussions with family.  Palliative team is following up with family daily, appreciate.   Diet Order            DIET - DYS 1 Room service appropriate? No; Fluid consistency: Thin  Diet effective now                 Nutrition Problem: Increased nutrient needs Etiology: wound healing (3 stage 4 PI and a DTI to left heel) Signs/Symptoms: estimated needs Interventions: Ensure Enlive (each supplement provides 350kcal and 20 grams of protein),MVI,Juven,Prostat Patient's Body mass index is 37.44 kg/m.  Pressure Injury 02/28/2021 Sacrum Mid Stage 4 - Full thickness tissue loss with exposed bone, tendon or muscle. tunneling (Active)  03/16/2021 2105  Location: Sacrum  Location Orientation: Mid  Staging: Stage 4 - Full thickness tissue loss with exposed bone, tendon or muscle.  Wound Description (Comments): tunneling  Present on Admission: Yes     Pressure Injury 02/23/2021 Sacrum Mid Stage 4 - Full thickness tissue loss with exposed bone, tendon or muscle. tunneling (Active)  02/27/2021 2106  Location: Sacrum  Location Orientation: Mid  Staging: Stage 4 - Full thickness tissue loss with exposed bone, tendon or muscle.  Wound Description (Comments): tunneling  Present on Admission: Yes     Pressure Injury 02/26/21 Ankle Right;Lateral Stage 4 - Full thickness tissue loss with exposed bone, tendon or muscle. (Active)  02/26/21 0202  Location: Ankle  Location Orientation: Right;Lateral  Staging: Stage 4 - Full thickness tissue loss with exposed bone, tendon or muscle.  Wound Description (Comments):   Present on Admission:      Pressure Injury 02/26/21 Foot Left;Lateral Deep Tissue Pressure Injury - Purple or maroon localized area of discolored intact skin or blood-filled blister due to damage of underlying soft tissue from pressure and/or shear. (Active)  02/26/21 0158  Location: Foot  Location Orientation: Left;Lateral  Staging: Deep Tissue  Pressure Injury - Purple or maroon localized area of discolored intact skin or blood-filled blister due to damage of underlying soft tissue from pressure and/or shear.  Wound Description (Comments):   Present on Admission: Yes    DVT prophylaxis: SCDs Start: 03/06/2021 1405 Code Status:   Code Status: Full Code  Family Communication: plan of care discussed with patient at bedside.  Status is: Inpatient Remains inpatient appropriate because:IV treatments appropriate due to intensity of illness or inability to take PO and Inpatient level of care appropriate due to severity of illness  Dispo: The patient is from: Home              Anticipated d/c is to: TBD              Patient currently is not medically stable to d/c.   Difficult to place patient No Unresulted Labs (From admission, onward)          Start     Ordered   03/12/21 0500  CK  Every Monday (0500),   R     Question:  Specimen collection method  Answer:  IV Team=IV Team collect   03/06/21 0829   03/10/21 0500  Creatinine, serum  (enoxaparin (LOVENOX)  CrCl >/= 30 mL/min  )  Weekly,   R     Comments: while on enoxaparin therapy.   Question:  Specimen collection method  Answer:  Lab=Lab collect   03/15/2021 1404   03/04/21 0500  CBC  Daily,   R     Question:  Specimen collection method  Answer:  Lab=Lab collect   03/06/2021 1404   03/04/21 8563  Basic metabolic panel  Daily,   R     Question:  Specimen collection method  Answer:  Lab=Lab collect   03/13/2021 1322   03/04/21 0500  CBC with Differential/Platelet  Daily,   R     Question:  Specimen collection method  Answer:  Lab=Lab collect   03/15/2021 1322   Unscheduled  Occult blood card to lab, stool  As needed,   R      03/05/21 1409        Medications reviewed:  Scheduled Meds: . Chlorhexidine Gluconate Cloth  6 each Topical Daily  . cilostazol  100 mg Oral BID  . docusate sodium  100 mg Oral BID  . feeding supplement  237 mL Oral TID BM  . levothyroxine  100 mcg Oral  Q0600  . mouth rinse  15 mL Mouth Rinse BID  . megestrol  40 mg Oral Daily  . metoprolol tartrate  12.5 mg Oral BID  . mirtazapine  7.5 mg Oral QHS  . multivitamin with minerals  1 tablet Oral Daily  . nutrition supplement (JUVEN)  1 packet Oral BID BM  . pantoprazole (PROTONIX) IV  40 mg Intravenous Q12H  . senna-docusate  1 tablet Oral QHS  . sodium bicarbonate  1,300 mg Oral BID  . sodium chloride flush  10-40 mL Intracatheter Q12H  . vitamin B-12  1,000 mcg Oral Daily   Continuous Infusions: . cefTRIAXone (ROCEPHIN)  IV 2 g (03/05/21 2313)  . DAPTOmycin (CUBICIN)  IV 500 mg (03/05/21 2117)    Consultants:see note  Procedures:see note  Antimicrobials: Anti-infectives (From admission, onward)   Start     Dose/Rate Route Frequency Ordered Stop   03/05/21 2000  DAPTOmycin (CUBICIN) 500 mg in sodium chloride 0.9 % IVPB        500 mg 120 mL/hr over 30 Minutes Intravenous Every 48 hours 02/24/2021 1517     02/26/2021 2300  cefTRIAXone (ROCEPHIN) 2 g in sodium chloride 0.9 % 100 mL IVPB        2 g 200 mL/hr over 30 Minutes Intravenous Every 24 hours 02/23/2021 2236     03/02/2021 2000  DAPTOmycin (CUBICIN) 500 mg in sodium chloride 0.9 % IVPB  Status:  Discontinued        8 mg/kg  61.3 kg 120 mL/hr over 30 Minutes Intravenous Every 48 hours 03/02/21 0855 03/08/2021 1320   02/20/2021 1500  DAPTOmycin (CUBICIN) 600 mg in sodium chloride 0.9 % IVPB  Status:  Discontinued        6 mg/kg  99 kg 124 mL/hr over 30 Minutes Intravenous Daily 02/17/2021 1400 03/13/2021 1402   03/17/2021 1500  DAPTOmycin (CUBICIN) 500 mg in sodium chloride 0.9 % IVPB  Status:  Discontinued        500 mg 120 mL/hr over 30 Minutes Intravenous Daily 02/26/2021 1405 03/16/2021 1517   02/17/2021 1045  tobramycin (NEBCIN) powder  Status:  Discontinued          As needed 02/17/2021 1045 03/01/2021 1201   03/06/2021 1037  vancomycin (VANCOCIN) powder  Status:  Discontinued          As needed 03/13/2021 1044 02/19/2021 1201   03/07/2021 0800   vancomycin (VANCOCIN) IVPB 1000 mg/200 mL premix        1,000 mg 200 mL/hr over 60 Minutes Intravenous On call to O.R. 03/09/2021 0752 03/07/2021 1040   03/02/21 2200  cefTRIAXone (ROCEPHIN) injection 2 g  Status:  Discontinued        2 g Intramuscular Every 24 hours 03/02/21 0904 02/24/2021 2235   03/02/21 0600  ceFAZolin (ANCEF) IVPB 2g/100 mL premix  Status:  Discontinued        2 g 200 mL/hr over 30 Minutes Intravenous On call to O.R. 03/02/21 0110 02/21/2021 0559   02/27/21 2200  vancomycin (VANCOREADY) IVPB 750 mg/150 mL  Status:  Discontinued        750 mg 150 mL/hr over 60 Minutes Intravenous Every 48 hours 02/26/2021 2342 03/02/21 0855   02/24/2021 2315  vancomycin (VANCOCIN) IVPB 1000 mg/200 mL premix        1,000 mg 200 mL/hr over 60 Minutes Intravenous  Once 03/16/2021 2313 02/26/21 0115   02/16/2021 2315  ceFEPIme (MAXIPIME) 2 g in sodium chloride 0.9 % 100 mL IVPB  Status:  Discontinued        2 g 200 mL/hr over 30 Minutes Intravenous Every 24 hours 02/27/2021 2313 03/02/21 0904     Culture/Microbiology    Component Value Date/Time   SDES FLUID 03/01/2021 1102   SPECREQUEST SYNOVIAL LEFT KNEE 03/01/2021 1102   CULT  03/01/2021 1102    NO GROWTH 3 DAYS  Performed at [facility], [place] [address], [place], [place] [address]    REPTSTATUS 03/05/2021 FINAL 03/01/2021 1102    Other culture-see note  Objective: Vitals: Today's Vitals   03/06/21 0806 03/06/21 1209 03/06/21 1226 03/06/21 1523  BP: 110/80 126/69 130/62 125/61  Pulse: 81 87 92 94  Resp: 18 18 18 18   Temp:  98.4 F (36.9 C) 98.2 F (36.8 C) 98.7 F (37.1 C)  TempSrc:  Axillary Axillary Oral  SpO2:  100% 100% 99%  Weight:      Height:      PainSc:        Intake/Output Summary (Last 24 hours) at 03/06/2021 1531 Last data filed at 03/06/2021 1520 Gross per 24 hour  Intake 378 ml  Output 470 ml  Net -92 ml   Filed Weights   02/26/21 0148 02/28/21 0500 03/02/21 1024  Weight: 61.2 kg 61.3 kg 99 kg   Weight  change:   Intake/Output from previous day: 04/18 0701 - 04/19 0700 In: 300 [Blood:300] Out: 470 [Urine:400; Drains:70] Intake/Output this shift: Total I/O In: 378 [Blood:378] Out: -  Filed Weights   02/26/21 0148 02/28/21 0500 03/02/21 1024  Weight: 61.2 kg 61.3 kg 99 kg    Examination: General exam: AAOx1-2, ill looking, elderly, frail and weak. HEENT:Oral mucosa moist, Ear/Nose WNL grossly, dentition normal. Respiratory system: bilaterally diminished,no wheezing or crackles,no use of accessory muscle Cardiovascular system: S1 & S2 +, No JVD, no significant murmurs Gastrointestinal system: Abdomen soft, NT,ND, BS+ Nervous System:Alert, awake, moving extremities and grossly nonfocal Extremities: No edema, distal peripheral pulses palpable.  Left knee area is tender left lower extremity swollen Skin: sacral lesions noted MSK: physical deconditioning  Data Reviewed: I have personally reviewed following labs and imaging studies CBC: Recent Labs  Lab 02/26/2021 0103 03/04/21 0733 03/05/21 0323 03/05/21 1126 03/06/21 0305 03/06/21 1010  WBC 13.6* 21.4* 17.6* 15.6* 13.5*  --   NEUTROABS  --  18.1* 14.5*  --  10.9*  --   HGB 7.4* 7.3* 5.6* 7.0* 6.9* 6.8*  HCT 22.8* 22.4* 17.5* 21.4* 21.6* 21.1*  MCV 93.4 90.3 93.6 88.4 87.8  --   PLT 255 220 182 187 197  --    Basic Metabolic Panel: Recent Labs  Lab 02/28/21 0511 03/01/21 0211 03/02/21 0151 03/04/21 0740 03/05/21 0323 03/05/21 1206 03/05/21 1831 03/06/21 0305  NA 133* 133*   < > 142 140 142 138 139  K 3.4* 4.6   < > 4.6 4.7 4.6 4.4 4.5  CL 102 106   < > 115* 114* 113* 112* 112*  CO2 22 23   < > 20* 21* 21* 21* 23  GLUCOSE 86 104*   < > 124* 114* 96 107* 84  BUN 68* 68*   < > 82* 85* 85* 85* 84*  CREATININE 2.04* 2.07*   < > 2.48* 2.87* 2.93* 2.99* 3.06*  CALCIUM 8.0* 8.1*   < > 8.2* 8.5* 8.9 8.3* 8.3*  MG 1.9  --   --   --   --   --   --   --   PHOS  --  2.9  --  3.5  --   --   --   --    < > = values in this  interval not displayed.   GFR: Estimated Creatinine Clearance: 14.2 mL/min (A) (by C-G formula based on SCr of 3.06 mg/dL (H)). Liver Function Tests: Recent Labs  Lab 03/01/21 0211 03/02/21 0151 03/02/2021 0103 03/04/21  0175 03/04/21 0740  AST  --  19 24 32  --   ALT  --  11 13 9   --   ALKPHOS  --  59 56 48  --   BILITOT  --  0.7 0.4 0.8  --   PROT  --  5.7* 5.8* 5.0*  --   ALBUMIN 1.6* 1.4* 1.4* 1.3* 1.4*   Coagulation Profile: No results for input(s): INR, PROTIME in the last 168 hours. Cardiac Enzymes: Recent Labs  Lab 03/02/2021 0103 03/04/21 0519 03/06/21 0305  CKTOTAL 69 97 87   CBG: No results for input(s): GLUCAP in the last 168 hours. Sepsis Labs: Recent Labs  Lab 03/04/21 1715  LATICACIDVEN 1.2    Recent Results (from the past 240 hour(s))  Blood culture (routine single)     Status: None   Collection Time: 03/06/2021  8:57 PM   Specimen: Right Antecubital; Blood  Result Value Ref Range Status   Specimen Description RIGHT ANTECUBITAL  Final   Special Requests   Final    BOTTLES DRAWN AEROBIC AND ANAEROBIC Blood Culture adequate volume   Culture   Final    NO GROWTH 5 DAYS Performed at [facility], [address], [place], [place] [address]    Report Status 03/02/2021 FINAL  Final  Resp Panel by RT-PCR (Flu A&B, Covid) Nasopharyngeal Swab     Status: None   Collection Time: 02/18/2021  9:00 PM   Specimen: Nasopharyngeal Swab; Nasopharyngeal(NP) swabs in vial transport medium  Result Value Ref Range Status   SARS Coronavirus 2 by RT PCR NEGATIVE NEGATIVE Final    Comment: (NOTE) SARS-CoV-2 target nucleic acids are NOT DETECTED.  The SARS-CoV-2 RNA is generally detectable in upper respiratory specimens during the acute phase of infection. The lowest concentration of SARS-CoV-2 viral copies this assay can detect is 138 copies/mL. A negative result does not preclude SARS-Cov-2 infection and should not be used as the sole basis for treatment or other patient  management decisions. A negative result may occur with  improper specimen collection/handling, submission of specimen other than nasopharyngeal swab, presence of viral mutation(s) within the areas targeted by this assay, and inadequate number of viral copies(<138 copies/mL). A negative result must be combined with clinical observations, patient history, and epidemiological information. The expected result is Negative.  Fact Sheet for Patients:  EntrepreneurPulse.com.au  Fact Sheet for Healthcare Providers:  IncredibleEmployment.be  This test is no t yet approved or cleared by the [place] FDA and  has been authorized for detection and/or diagnosis of SARS-CoV-2 by FDA under an Emergency Use Authorization (EUA). This EUA will remain  in effect (meaning this test can be used) for the duration of the COVID-19 declaration under Section 564(b)(1) of the Act, 21 U.S.C.section 360bbb-3(b)(1), unless the authorization is terminated  or revoked sooner.       Influenza A by PCR NEGATIVE NEGATIVE Final   Influenza B by PCR NEGATIVE NEGATIVE Final    Comment: (NOTE) The Xpert Xpress SARS-CoV-2/FLU/RSV plus assay is intended as an aid in the diagnosis of influenza from Nasopharyngeal swab specimens and should not be used as a sole basis for treatment. Nasal washings and aspirates are unacceptable for Xpert Xpress SARS-CoV-2/FLU/RSV testing.  Fact Sheet for Patients: EntrepreneurPulse.com.au  Fact Sheet for Healthcare Providers: IncredibleEmployment.be  This test is not yet approved or cleared by the [place] FDA and has been authorized for detection and/or diagnosis of SARS-CoV-2 by FDA under an Emergency Use Authorization (EUA). This EUA will  remain in effect (meaning this test can be used) for the duration of the COVID-19 declaration under Section 564(b)(1) of the Act, 21 U.S.C. section 360bbb-3(b)(1),  unless the authorization is terminated or revoked.  Performed at [facility], [address], [place], [place] [address]   Urine culture     Status: Abnormal   Collection Time: 02/19/2021 11:57 PM   Specimen: In/Out Cath Urine  Result Value Ref Range Status   Specimen Description   Final    IN/OUT CATH URINE Performed at [facility], [address], [place], [place] [address]    Special Requests   Final    NONE Performed at [facility], [address] [place], [place] [address]    Culture 80,000 COLONIES/mL ENTEROCOCCUS AVIUM (A)  Final   Report Status 03/01/2021 FINAL  Final   Organism ID, Bacteria ENTEROCOCCUS AVIUM (A)  Final      Susceptibility   Enterococcus avium - MIC*    AMPICILLIN 16 RESISTANT Resistant     NITROFURANTOIN <=16 SENSITIVE Sensitive     VANCOMYCIN <=0.5 SENSITIVE Sensitive     * 80,000 COLONIES/mL ENTEROCOCCUS AVIUM  Body fluid culture w Gram Stain     Status: None   Collection Time: 03/01/21 11:02 AM   Specimen: Body Fluid  Result Value Ref Range Status   Specimen Description FLUID  Final   Special Requests SYNOVIAL LEFT KNEE  Final   Gram Stain   Final    ABUNDANT WBC PRESENT, PREDOMINANTLY MONONUCLEAR ABUNDANT GRAM POSITIVE COCCI IN PAIRS IN CHAINS    Culture   Final    NO GROWTH 3 DAYS Performed at [facility], [place] [address] [place],  [address]    Report Status 03/05/2021 FINAL  Final     Radiology Studies: CT ABDOMEN PELVIS WO CONTRAST  Result Date: 03/06/2021 CLINICAL DATA:  87 year old female with history of renal mass. Renal insufficiency. CT the abdomen EXAM: CT ABDOMEN AND PELVIS WITHOUT CONTRAST TECHNIQUE: Multidetector CT imaging of the abdomen and pelvis was performed following the standard protocol without IV contrast. COMPARISON:  CT the abdomen and pelvis 04/16/2009. FINDINGS: Comment: Today's study is limited for detection and characterization of visceral and/or vascular lesions by lack of IV contrast. Lower chest:  Small bilateral pleural effusions with what appears to be areas of dependent subsegmental atelectasis in the lower lobes of the lungs bilaterally. Atherosclerotic calcifications in the descending thoracic aorta as well as the left anterior descending, left circumflex and right coronary arteries. Hepatobiliary: No suspicious cystic or solid hepatic lesions are confidently identified on today's noncontrast CT examination. Unenhanced appearance of the gallbladder is unremarkable. Pancreas: No definite pancreatic mass or peripancreatic fluid collections or inflammatory changes are noted on today's noncontrast CT examination. Spleen: Unremarkable. Adrenals/Urinary Tract: No definite renal mass confidently identified on today's noncontrast CT examination. No hydroureteronephrosis. Urinary bladder is decompressed around an indwelling Foley balloon catheter. Bilateral adrenal glands are normal in appearance. Stomach/Bowel: Unenhanced appearance of the stomach is normal. No pathologic dilatation of small bowel or colon. Status post appendectomy. Vascular/Lymphatic: Aortic atherosclerosis. No lymphadenopathy noted in the abdomen or pelvis. Reproductive: Status post hysterectomy.  Ovaries are a trophic. Other: No significant volume of ascites.  No pneumoperitoneum. Musculoskeletal: Diffuse body wall edema. There are no aggressive appearing lytic or blastic lesions noted in the visualized portions of the skeleton. IMPRESSION: 1. Limited study secondary to lack of IV contrast. No renal mass confidently identified on today's noncontrast examination. 2. Diffuse body wall edema and  small bilateral pleural effusions; imaging findings that may suggest a state of anasarca. 3. Aortic atherosclerosis, in addition to at least 3 vessel coronary artery disease. 4. Additional incidental findings, as above. Electronically Signed   By: Vinnie Langton M.D.   On: 03/06/2021 08:07   US RENAL  Result Date: 03/04/2021 CLINICAL DATA:  AK I  EXAM: RENAL / URINARY TRACT ULTRASOUND COMPLETE COMPARISON:  CT abdomen pelvis 04/16/2009 FINDINGS: Right Kidney: Renal measurements: 8.7 x 4.3 x 5.7 cm = volume: 113 mL. Echogenicity is increased. There is a solid mass in the inferior pole measuring 2.4 x 2.8 x 2.9 cm. No hydronephrosis. No shadowing renal calculi. Left Kidney: Renal measurements: 7.6 x 3.3 x 4.1 cm = volume: 54 mL. Echogenicity within normal limits. No mass or hydronephrosis visualized. Bladder: Decompressed with Foley catheter in place. Other: None. IMPRESSION: 1. There is a solid lesion in the inferior pole the right kidney measuring 2.9 cm. Further evaluation with renal protocol CT or MRI is recommended. 2. Increased echogenicity of the right kidney as can be seen in medical renal disease. 3.  Unremarkable sonographic appearance of the left kidney. Electronically Signed   By: Audie Pinto M.D.   On: 03/04/2021 15:42     LOS: 9 days   George Hugh, MD [facility]  03/06/2021, 3:31 PM

## 2021-03-06 NOTE — Progress Notes (Signed)
[facility] ROUNDING NOTE   Subjective:   Brief history: This is an 87 year old lady with a history of hyperlipidemia peripheral vascular disease atrial fibrillation hypertension hypothyroidism who was admitted with a prosthetic joint infection.  She was treated with vancomycin.  Baseline creatinine is 0.8 to 1 mg/dL.  She has developed acute kidney injury with a creatinine 2.87 mg/dL 03/05/2021.  She is oliguric with only 250 cc of urine 03/05/2021  Blood pressure 110/80 pulse 78 afebrile.  Current medications: Cilostazol 100 mg twice daily, levothyroxine 100 mcg daily, metoprolol 12.5 mg twice daily, Protonix 40 mg every 12 hours, sodium bicarbonate 1.3 g twice daily.  IV Rocephin 2 g every 24 hours, daptomycin 500 mg every 48 hours  Sodium 139 potassium 4.5 chloride 112 CO2 23 BUN 84 creatinine 3.06 glucose 84 calcium 8.3 hemoglobin 6.9  Urinalysis cloudy.  Patient is on antibiotic therapy at this time.  We will send urine culture.  CT scan did not reveal any evidence of solid mass.   .  She is progressively oliguric and decisions will need to be made regarding appropriateness of initiating hemodialysis.  Objective:  Vital signs in last 24 hours:  Temp:  [98.2 F (36.8 C)-98.7 F (37.1 C)] 98.7 F (37.1 C) (04/18 1559) Pulse Rate:  [81-101] 81 (04/19 0806) Resp:  [16-18] 18 (04/19 0806) BP: (110-139)/(65-80) 110/80 (04/19 0806)  Weight change:  Filed Weights   02/26/21 0148 02/28/21 0500 03/02/21 1024  Weight: 61.2 kg 61.3 kg 99 kg    Intake/Output: I/O last 3 completed shifts: In: 425 [I.V.:20; Blood:300; Other:5; IV Piggyback:100] Out: 640 [Urine:500; Drains:140]   Intake/Output this shift:  No intake/output data recorded.  General: Chronically ill-appearing, lying in bed, no distress HEENT: anicteric sclera, oropharynx clear without lesions CV: Normal rate, no peripheral edema Lungs: clear to auscultation bilaterally, normal work of breathing Abd: soft,  non-tender, non-distended Skin: no visible lesions or rashes Psych: Awake and alert with appropriate mood Musculoskeletal: Left leg wrapped in Ace bandage, otherwise no significant deformities Neuro: normal speech, oriented to person but not place time or situation  Basic Metabolic Panel: Recent Labs  Lab 02/28/21 0511 03/01/21 0211 03/02/21 0151 03/04/21 0740 03/05/21 0323 03/05/21 1206 03/05/21 1831 03/06/21 0305  NA 133* 133*   < > 142 140 142 138 139  K 3.4* 4.6   < > 4.6 4.7 4.6 4.4 4.5  CL 102 106   < > 115* 114* 113* 112* 112*  CO2 22 23   < > 20* 21* 21* 21* 23  GLUCOSE 86 104*   < > 124* 114* 96 107* 84  BUN 68* 68*   < > 82* 85* 85* 85* 84*  CREATININE 2.04* 2.07*   < > 2.48* 2.87* 2.93* 2.99* 3.06*  CALCIUM 8.0* 8.1*   < > 8.2* 8.5* 8.9 8.3* 8.3*  MG 1.9  --   --   --   --   --   --   --   PHOS  --  2.9  --  3.5  --   --   --   --    < > = values in this interval not displayed.    Liver Function Tests: Recent Labs  Lab 03/01/21 0211 03/02/21 0151 03/07/2021 0103 03/04/21 0519 03/04/21 0740  AST  --  19 24 32  --   ALT  --  11 13 9   --   ALKPHOS  --  59 56 48  --   BILITOT  --  0.7 0.4 0.8  --   PROT  --  5.7* 5.8* 5.0*  --   ALBUMIN 1.6* 1.4* 1.4* 1.3* 1.4*   No results for input(s): LIPASE, AMYLASE in the last 168 hours. No results for input(s): AMMONIA in the last 168 hours.  CBC: Recent Labs  Lab 03/11/2021 0103 03/04/21 0733 03/05/21 0323 03/05/21 1126 03/06/21 0305  WBC 13.6* 21.4* 17.6* 15.6* 13.5*  NEUTROABS  --  18.1* 14.5*  --  10.9*  HGB 7.4* 7.3* 5.6* 7.0* 6.9*  HCT 22.8* 22.4* 17.5* 21.4* 21.6*  MCV 93.4 90.3 93.6 88.4 87.8  PLT 255 220 182 187 197    Cardiac Enzymes: Recent Labs  Lab 03/17/2021 0103 03/04/21 0519 03/06/21 0305  CKTOTAL 69 97 87    BNP: Invalid input(s): POCBNP  CBG: No results for input(s): GLUCAP in the last 168 hours.  Microbiology: Results for orders placed or performed during the hospital encounter  of 03/14/2021  Blood culture (routine single)     Status: None   Collection Time: 02/22/2021  8:57 PM   Specimen: Right Antecubital; Blood  Result Value Ref Range Status   Specimen Description RIGHT ANTECUBITAL  Final   Special Requests   Final    BOTTLES DRAWN AEROBIC AND ANAEROBIC Blood Culture adequate volume   Culture   Final    NO GROWTH 5 DAYS Performed at [facility], [address] [place], [place] [address]    Report Status 03/02/2021 FINAL  Final  Resp Panel by RT-PCR (Flu A&B, Covid) Nasopharyngeal Swab     Status: None   Collection Time: 03/13/2021  9:00 PM   Specimen: Nasopharyngeal Swab; Nasopharyngeal(NP) swabs in vial transport medium  Result Value Ref Range Status   SARS Coronavirus 2 by RT PCR NEGATIVE NEGATIVE Final    Comment: (NOTE) SARS-CoV-2 target nucleic acids are NOT DETECTED.  The SARS-CoV-2 RNA is generally detectable in upper respiratory specimens during the acute phase of infection. The lowest concentration of SARS-CoV-2 viral copies this assay can detect is 138 copies/mL. A negative result does not preclude SARS-Cov-2 infection and should not be used as the sole basis for treatment or other patient management decisions. A negative result may occur with  improper specimen collection/handling, submission of specimen other than nasopharyngeal swab, presence of viral mutation(s) within the areas targeted by this assay, and inadequate number of viral copies(<138 copies/mL). A negative result must be combined with clinical observations, patient history, and epidemiological information. The expected result is Negative.  Fact Sheet for Patients:  EntrepreneurPulse.com.au  Fact Sheet for Healthcare Providers:  IncredibleEmployment.be  This test is no t yet approved or cleared by the [place] FDA and  has been authorized for detection and/or diagnosis of SARS-CoV-2 by FDA under an Emergency Use Authorization (EUA). This  EUA will remain  in effect (meaning this test can be used) for the duration of the COVID-19 declaration under Section 564(b)(1) of the Act, 21 U.S.C.section 360bbb-3(b)(1), unless the authorization is terminated  or revoked sooner.       Influenza A by PCR NEGATIVE NEGATIVE Final   Influenza B by PCR NEGATIVE NEGATIVE Final    Comment: (NOTE) The Xpert Xpress SARS-CoV-2/FLU/RSV plus assay is intended as an aid in the diagnosis of influenza from Nasopharyngeal swab specimens and should not be used as a sole basis for treatment. Nasal washings and aspirates are unacceptable for Xpert Xpress SARS-CoV-2/FLU/RSV testing.  Fact Sheet for Patients: EntrepreneurPulse.com.au  Fact Sheet for Healthcare Providers: IncredibleEmployment.be  This test  is not yet approved or cleared by the [place] and has been authorized for detection and/or diagnosis of SARS-CoV-2 by FDA under an Emergency Use Authorization (EUA). This EUA will remain in effect (meaning this test can be used) for the duration of the COVID-19 declaration under Section 564(b)(1) of the Act, 21 U.S.C. section 360bbb-3(b)(1), unless the authorization is terminated or revoked.  Performed at [facility], [address] [place], [place] [address]   Urine culture     Status: Abnormal   Collection Time: 02/24/2021 11:57 PM   Specimen: In/Out Cath Urine  Result Value Ref Range Status   Specimen Description   Final    IN/OUT CATH URINE Performed at [facility], [address], [place], [place] [address]    Special Requests   Final    NONE Performed at [facility], [address], [place], [place] [address]    Culture 80,000 COLONIES/mL ENTEROCOCCUS AVIUM (A)  Final   Report Status 03/01/2021 FINAL  Final   Organism ID, Bacteria ENTEROCOCCUS AVIUM (A)  Final      Susceptibility   Enterococcus avium - MIC*    AMPICILLIN 16 RESISTANT Resistant     NITROFURANTOIN <=16 SENSITIVE  Sensitive     VANCOMYCIN <=0.5 SENSITIVE Sensitive     * 80,000 COLONIES/mL ENTEROCOCCUS AVIUM  Body fluid culture w Gram Stain     Status: None   Collection Time: 03/01/21 11:02 AM   Specimen: Body Fluid  Result Value Ref Range Status   Specimen Description FLUID  Final   Special Requests SYNOVIAL LEFT KNEE  Final   Gram Stain   Final    ABUNDANT WBC PRESENT, PREDOMINANTLY MONONUCLEAR ABUNDANT GRAM POSITIVE COCCI IN PAIRS IN CHAINS    Culture   Final    NO GROWTH 3 DAYS Performed at [facility], [place] [address] [place], [place] [address]    Report Status 03/05/2021 FINAL  Final    Coagulation Studies: No results for input(s): LABPROT, INR in the last 72 hours.  Urinalysis: Recent Labs    03/04/21 0717  COLORURINE AMBER*  LABSPEC 1.020  PHURINE 5.0  GLUCOSEU NEGATIVE  HGBUR MODERATE*  BILIRUBINUR NEGATIVE  KETONESUR 5*  PROTEINUR 100*  NITRITE NEGATIVE  LEUKOCYTESUR LARGE*      Imaging: CT ABDOMEN PELVIS WO CONTRAST  Result Date: 03/06/2021 CLINICAL DATA:  87 year old female with history of renal mass. Renal insufficiency. CT the abdomen EXAM: CT ABDOMEN AND PELVIS WITHOUT CONTRAST TECHNIQUE: Multidetector CT imaging of the abdomen and pelvis was performed following the standard protocol without IV contrast. COMPARISON:  CT the abdomen and pelvis 04/16/2009. FINDINGS: Comment: Today's study is limited for detection and characterization of visceral and/or vascular lesions by lack of IV contrast. Lower chest: Small bilateral pleural effusions with what appears to be areas of dependent subsegmental atelectasis in the lower lobes of the lungs bilaterally. Atherosclerotic calcifications in the descending thoracic aorta as well as the left anterior descending, left circumflex and right coronary arteries. Hepatobiliary: No suspicious cystic or solid hepatic lesions are confidently identified on today's noncontrast CT examination. Unenhanced appearance of the gallbladder is  unremarkable. Pancreas: No definite pancreatic mass or peripancreatic fluid collections or inflammatory changes are noted on today's noncontrast CT examination. Spleen: Unremarkable. Adrenals/Urinary Tract: No definite renal mass confidently identified on today's noncontrast CT examination. No hydroureteronephrosis. Urinary bladder is decompressed around an indwelling Foley balloon catheter. Bilateral adrenal glands are normal in appearance. Stomach/Bowel: Unenhanced appearance of the stomach is normal.  No pathologic dilatation of small bowel or colon. Status post appendectomy. Vascular/Lymphatic: Aortic atherosclerosis. No lymphadenopathy noted in the abdomen or pelvis. Reproductive: Status post hysterectomy.  Ovaries are a trophic. Other: No significant volume of ascites.  No pneumoperitoneum. Musculoskeletal: Diffuse body wall edema. There are no aggressive appearing lytic or blastic lesions noted in the visualized portions of the skeleton. IMPRESSION: 1. Limited study secondary to lack of IV contrast. No renal mass confidently identified on today's noncontrast examination. 2. Diffuse body wall edema and small bilateral pleural effusions; imaging findings that may suggest a state of anasarca. 3. Aortic atherosclerosis, in addition to at least 3 vessel coronary artery disease. 4. Additional incidental findings, as above. Electronically Signed   By: Vinnie Langton M.D.   On: 03/06/2021 08:07   US RENAL  Result Date: 03/04/2021 CLINICAL DATA:  AK I EXAM: RENAL / URINARY TRACT ULTRASOUND COMPLETE COMPARISON:  CT abdomen pelvis 04/16/2009 FINDINGS: Right Kidney: Renal measurements: 8.7 x 4.3 x 5.7 cm = volume: 113 mL. Echogenicity is increased. There is a solid mass in the inferior pole measuring 2.4 x 2.8 x 2.9 cm. No hydronephrosis. No shadowing renal calculi. Left Kidney: Renal measurements: 7.6 x 3.3 x 4.1 cm = volume: 54 mL. Echogenicity within normal limits. No mass or hydronephrosis visualized. Bladder:  Decompressed with Foley catheter in place. Other: None. IMPRESSION: 1. There is a solid lesion in the inferior pole the right kidney measuring 2.9 cm. Further evaluation with renal protocol CT or MRI is recommended. 2. Increased echogenicity of the right kidney as can be seen in medical renal disease. 3.  Unremarkable sonographic appearance of the left kidney. Electronically Signed   By: Audie Pinto M.D.   On: 03/04/2021 15:42     Medications:   . cefTRIAXone (ROCEPHIN)  IV 2 g (03/05/21 2313)  . DAPTOmycin (CUBICIN)  IV 500 mg (03/05/21 2117)   . Chlorhexidine Gluconate Cloth  6 each Topical Daily  . cilostazol  100 mg Oral BID  . docusate sodium  100 mg Oral BID  . feeding supplement  237 mL Oral TID BM  . levothyroxine  100 mcg Oral Q0600  . mouth rinse  15 mL Mouth Rinse BID  . metoprolol tartrate  12.5 mg Oral BID  . mirtazapine  7.5 mg Oral QHS  . multivitamin with minerals  1 tablet Oral Daily  . nutrition supplement (JUVEN)  1 packet Oral BID BM  . pantoprazole (PROTONIX) IV  40 mg Intravenous Q12H  . senna-docusate  1 tablet Oral QHS  . sodium bicarbonate  1,300 mg Oral BID  . sodium chloride flush  10-40 mL Intracatheter Q12H  . vitamin B-12  1,000 mcg Oral Daily   acetaminophen, ALPRAZolam, alum & mag hydroxide-simeth, diphenhydrAMINE, haloperidol lactate, HYDROcodone-acetaminophen, HYDROcodone-acetaminophen, menthol-cetylpyridinium **OR** phenol, metoCLOPramide **OR** metoCLOPramide (REGLAN) injection, metoprolol tartrate, morphine injection, ondansetron **OR** ondansetron (ZOFRAN) IV, polyethylene glycol, sodium chloride flush  Assessment/ Plan:  Oliguric AKI: BL 0.8-1 but higher outpatient recently and unclear why. AKI here initially improved now worse over past 24 hours w/ low UOP. 5L positive but possibly outs not well documented. She does not appear overloaded and may be slightly dry. AKI likely related to acute illness with possible tubular injury 2/2 sepsis w/  borderline hypotension and ongoing tachycardia. Vanc could be contributing -Underwent administration of 5% albumin 03/05/2021.  Hypoalbuminemia noted on laboratory evaluation -Continue hydration PRN -Urinalysis 100 mg/dL protein.  No protein noted 03/02/2021.  WBCs greater than 50.  RBCs 11-20.  This shows some change from urinalysis of 03/10/2021.  Random vancomycin level of 35 -Continue to monitor daily Cr, Dose meds for GFR -Monitor Daily I/Os, Daily weight  -Maintain MAP>65 for optimal renal perfusion.  -Avoid nephrotoxic medications including NSAIDs and Vanc/Zosyn combo -CT scan did not identify any renal mass  2.Prosthetic Joint Infection: s/p procedure with Ortho now on antibiotics. Cx w/ GPCs. F/u further results. Mgmt per primary, ortho, and ID  3.Sepsis: related to joint infection. WBC 21.4. Abx as above  4.Anemia: likely multifactorial w/ acute illness contributing. hgb of 6.9. No iron given infection. Transfusion as needed  5. NAGMA: likely 2/2 AKI. Bicarb 20. CTM     LOS: [place] @TODAY @8 :13 AM

## 2021-03-07 ENCOUNTER — Inpatient Hospital Stay (HOSPITAL_COMMUNITY): Payer: Medicare Other

## 2021-03-07 DIAGNOSIS — T8459XA Infection and inflammatory reaction due to other internal joint prosthesis, initial encounter: Secondary | ICD-10-CM | POA: Diagnosis not present

## 2021-03-07 DIAGNOSIS — G9341 Metabolic encephalopathy: Secondary | ICD-10-CM | POA: Diagnosis not present

## 2021-03-07 DIAGNOSIS — A419 Sepsis, unspecified organism: Secondary | ICD-10-CM | POA: Diagnosis not present

## 2021-03-07 DIAGNOSIS — N179 Acute kidney failure, unspecified: Secondary | ICD-10-CM | POA: Diagnosis not present

## 2021-03-07 LAB — CBC WITH DIFFERENTIAL/PLATELET
Abs Immature Granulocytes: 0.3 10*3/uL — ABNORMAL HIGH (ref 0.00–0.07)
Basophils Absolute: 0 10*3/uL (ref 0.0–0.1)
Basophils Relative: 0 %
Eosinophils Absolute: 0.3 10*3/uL (ref 0.0–0.5)
Eosinophils Relative: 2 %
HCT: 26.5 % — ABNORMAL LOW (ref 36.0–46.0)
Hemoglobin: 8.5 g/dL — ABNORMAL LOW (ref 12.0–15.0)
Immature Granulocytes: 2 %
Lymphocytes Relative: 8 %
Lymphs Abs: 1.2 10*3/uL (ref 0.7–4.0)
MCH: 28.3 pg (ref 26.0–34.0)
MCHC: 32.1 g/dL (ref 30.0–36.0)
MCV: 88.3 fL (ref 80.0–100.0)
Monocytes Absolute: 0.7 10*3/uL (ref 0.1–1.0)
Monocytes Relative: 5 %
Neutro Abs: 11.2 10*3/uL — ABNORMAL HIGH (ref 1.7–7.7)
Neutrophils Relative %: 83 %
Platelets: 258 10*3/uL (ref 150–400)
RBC: 3 MIL/uL — ABNORMAL LOW (ref 3.87–5.11)
RDW: 18.3 % — ABNORMAL HIGH (ref 11.5–15.5)
WBC: 13.6 10*3/uL — ABNORMAL HIGH (ref 4.0–10.5)
nRBC: 0 % (ref 0.0–0.2)

## 2021-03-07 LAB — BASIC METABOLIC PANEL
Anion gap: 9 (ref 5–15)
Anion gap: 8 (ref 5–15)
BUN: 84 mg/dL — ABNORMAL HIGH (ref 8–23)
BUN: 80 mg/dL — ABNORMAL HIGH (ref 8–23)
CO2: 19 mmol/L — ABNORMAL LOW (ref 22–32)
CO2: 21 mmol/L — ABNORMAL LOW (ref 22–32)
Calcium: 8.7 mg/dL — ABNORMAL LOW (ref 8.9–10.3)
Calcium: 8.8 mg/dL — ABNORMAL LOW (ref 8.9–10.3)
Chloride: 114 mmol/L — ABNORMAL HIGH (ref 98–111)
Chloride: 112 mmol/L — ABNORMAL HIGH (ref 98–111)
Creatinine, Ser: 2.94 mg/dL — ABNORMAL HIGH (ref 0.44–1.00)
Creatinine, Ser: 2.91 mg/dL — ABNORMAL HIGH (ref 0.44–1.00)
GFR, Estimated: 15 mL/min — ABNORMAL LOW (ref >60)
GFR, Estimated: 15 mL/min — ABNORMAL LOW (ref >60)
Glucose, Bld: 75 mg/dL (ref 70–99)
Glucose, Bld: 96 mg/dL (ref 70–99)
Potassium: 4.3 mmol/L (ref 3.5–5.1)
Potassium: 4 mmol/L (ref 3.5–5.1)
Sodium: 142 mmol/L (ref 135–145)
Sodium: 141 mmol/L (ref 135–145)

## 2021-03-07 LAB — TYPE AND SCREEN
ABO/RH(D): O NEG
Antibody Screen: NEGATIVE
Unit division: 0
Unit division: 0
Unit division: 0
Unit division: 0

## 2021-03-07 LAB — BPAM RBC
Blood Product Expiration Date: 202204212359
Blood Product Expiration Date: 202204212359
Blood Product Expiration Date: 202205172359
Blood Product Expiration Date: 202205192359
ISSUE DATE / TIME: 202204160819
ISSUE DATE / TIME: 202204160819
ISSUE DATE / TIME: 202204180623
ISSUE DATE / TIME: 202204191206
Unit Type and Rh: 5100
Unit Type and Rh: 5100
Unit Type and Rh: 9500
Unit Type and Rh: 9500

## 2021-03-07 LAB — HEMOGLOBIN AND HEMATOCRIT, BLOOD
HCT: 27.5 % — ABNORMAL LOW (ref 36.0–46.0)
Hemoglobin: 8.9 g/dL — ABNORMAL LOW (ref 12.0–15.0)

## 2021-03-07 MED ORDER — METOPROLOL TARTRATE 25 MG PO TABS
25.0000 mg | ORAL_TABLET | Freq: Two times a day (BID) | ORAL | Status: DC
  Administered 2021-03-07 – 2021-03-10 (×4): 25 mg via ORAL
  Filled 2021-03-07 (×4): qty 1

## 2021-03-07 MED ORDER — HEPARIN SODIUM (PORCINE) 5000 UNIT/ML IJ SOLN
5000.0000 [IU] | Freq: Two times a day (BID) | INTRAMUSCULAR | Status: DC
  Administered 2021-03-07 – 2021-03-15 (×17): 5000 [IU] via SUBCUTANEOUS
  Filled 2021-03-07 (×17): qty 1

## 2021-03-07 MED ORDER — BUMETANIDE 0.25 MG/ML IJ SOLN
2.0000 mg | Freq: Two times a day (BID) | INTRAMUSCULAR | Status: DC
  Administered 2021-03-07 (×2): 2 mg via INTRAVENOUS
  Filled 2021-03-07 (×3): qty 8

## 2021-03-07 MED ORDER — SODIUM BICARBONATE 650 MG PO TABS: 650.0000 mg | ORAL_TABLET | Freq: Two times a day (BID) | ORAL | Status: DC

## 2021-03-07 MED ORDER — POLYETHYLENE GLYCOL 3350 17 G PO PACK
17.0000 g | PACK | Freq: Every day | ORAL | Status: DC
  Administered 2021-03-07: 17 g via ORAL
  Filled 2021-03-07 (×3): qty 1

## 2021-03-07 MED ORDER — SODIUM BICARBONATE 650 MG PO TABS
1300.0000 mg | ORAL_TABLET | Freq: Three times a day (TID) | ORAL | Status: DC
  Administered 2021-03-09 (×2): 1300 mg via ORAL
  Filled 2021-03-07 (×6): qty 2

## 2021-03-07 MED ORDER — MEGESTROL ACETATE 400 MG/10ML PO SUSP
400.0000 mg | Freq: Every day | ORAL | Status: DC
  Filled 2021-03-07 (×3): qty 10

## 2021-03-07 NOTE — Progress Notes (Addendum)
Patients HR sustaining 140s-160s PRN lopressor given per MAR. BP 137/86. Patient resting in bed. Attending notified.  HR in 80s after 4th IV lopressor. 2200 dose of PO lopressor given early per attending. EKG ordered, instructed to obtain if HR elevates again.

## 2021-03-07 NOTE — Progress Notes (Incomplete)
    Progress Note from the Palliative Medicine Team at [facility]   Patient Name: [name]       Date: 03/07/2021 DOB: 08-07-1929  Age: 87 y.o. MRN#: [id number] Attending Physician: George Hugh, MD Primary Care Physician: Rosita Fire, MD Admit Date: 03/16/2021   Medical records reviewed   Per intake H&P --> 87 year old female with a PMH of Chronic Dementia, Peripheral Vascular Disease, and multiple medical problems presented to [facility] ED on 4/11 with confusion, weakness, mild AKI (Creatinine 2.53 with baseline ~ 1 mg/dL) and acute swelling and redness of the left knee. Left Knee Arthrocentesis performed by Ortho followed by a left Total Knee Arthroplasty with Radical Synovectomy on 4/16. Patient required 1 unit of PRBC's today for anemia.   Palliative care was asked to get involved to further address goals of care in the setting of ongoing infection(s).   Family face treatment option decisions, advanced directive decisions and anticipatory care needs.  This NP visited patient at the bedside as a follow up to  yesterday's [place].  I met with patients daughters, Corliss Skains and Leodis Liverpool  to furtherdiscuss diagnosis prognosis, GOC, EOL wishes, disposition and options.  Patient's sister/Louise and her husband were present at bedside. (Patient has no documented H POA, both daughters act together to make decisions in the patient's best interest)  Continued conversation regarding the seriousness of Ms. Jeanty's current medical situation; sepsis,  worsening renal function, stage IV sacral wounds, dysphagia and poor oral intake, acute anemia requiring transfusion, dementia based dysphagia  and  overall failure to thrive.  Education and conversation specific the concept of human mortality and adult failure to thrive.  We discussed limitations of medical interventions to prolong quality of life when the body does fail to thrive.  Chaplain services declined at this  time  Education offered on her worsening renal function and the fact that the patient would likely be a poor candidate for dialysis  [name] and [name] both tell me that they have never had any conversation discussing end-of-life wishes with their mother or each other.  [name] speaks to her son's death a year ago   The difference between an aggressive medical intervention path and a palliative comfort path for this patient at this time and the situation was detailed.  Both patient's daughter verbalized their openness to all offered and available medical interventions to prolong life.  "We are hopeful for a miracle".  Emotional support offered.  I discussed with daughters the importance of considering best case scenario versus worst-case scenario.  Discussed with family  the importance of continued conversation with each other and the  medical providers regarding overall plan of care and treatment options,  ensuring decisions are within the context of the patients values and GOCs.  Questions and concerns addressed   Discussed with Dr Lavera Guise  PMT will continue to support holistically  Total time spent on the unit was 60 minutes  Greater than 50% of the time was spent in counseling and coordination of care  Wadie Lessen NP  Palliative Medicine Team Team Phone # [phone] Pager [phone]

## 2021-03-07 NOTE — Progress Notes (Addendum)
PROGRESS NOTE    [name]  JKD:[id number] DOB: 01/31/1929 DOA: 02/16/2021 PCP: Rosita Fire, MD   Chief Complaint  Patient presents with  . Weakness  Brief Narrative: 87 year old female with a PMH of Chronic Dementia, Peripheral Vascular Disease, and multiple medical problems presented to [facility] ED on 4/11 with confusion, weakness, mild AKI (Creatinine 2.53 with baseline ~ 1 mg/dL) and acute swelling and redness of the left knee consistent with septic joint.    On 2/27 - patient sustained a periprosthetic femur fracture s/p mechanical fall and underwent a Left Femur Replacement performed by Dr. Lyla Glassing.     On 4/14 - Left Knee Arthrocentesis performed by Ortho.  WBC was 100K.  Cultures are growing GPCs.  On 4/16 - I&D, Left Total Knee Arthroplasty with Radical Synovectomy performed by Ortho Dr. Lyla Glassing.  Intraoperative cultures sent.  On 4/18 - Hb was 5.5 g/dL, received 1 unit pRBC  4/14 Blood Culture: NG 4/14 Urine Culture: Enterococcus Avium  Subjective: Patient's mental status is much better today, but it continues to wax and wane. Patient is unable to pass swallow evaluations.  We will start Tube Feeds and discuss with family PEG tube placement. We have recommended comfort care to the family.  They are still processing their mother's terminal disease, and would like to continue care and full code status. I asked daughter what her mother would want - "She would want you to do everything." I reassured family that we will continue full care for now.  Assessment & Plan:  Acute Anemia - postop blood loss in the setting of AOCKD:  - 4/18 1 unit pRBC. - 4/19 1 unit pRBC - Hold Aspirin.  It is OK to restart DVT prophylaxis. - GI is following patient, appreciate. - Continue IV Protonix 40 mg BID.  Left Knee Prosthetic Joint Infection s/p 4/16 Resection Arthroplasty and Radical Synovectomy: - 4/14 aspiration cultures showed GPCs in pairs and in chains. - 4/16  intraoperative cultures are not uploaded.  We will call the lab.  Ortho is following, appreciate. - On Day 5 of Daptomycin 6 mg/kg QD and Ceftriaxone 2g QD.  Patient will need at least 6 weeks of IV therapy followed by oral drug therapy based on susceptibilities plus Rifampin 300-450 mg PO BID.  Infectious Disease is following, appreciate. - Monitor LFTs and CK weekly while on Daptomycin. - On 4/17, PICC line was placed.  Patient will need outpatient IV antibiotics organized.  This was discussed with CM.  Leukocytosis: - WBC has decreased to 13K.  This is due to ongoing infection.   - Monitor daily.  Left Lower Extremity Non-Purulent Cellulitis / Osteomyelitis: - MRI of ankle shows findings of early osteomyelitis. - Consult Vascular Surgery if family agrees. - Infectious Disease is following, appreciate.   Stage 4 Sacral Ulcer, POA: - MRI of sacrum is pending. - Continue antibiotics as above.   Enterococcus Avium UTI: 4/14 Urine cultures are resistant to Ampicillin and sensitive to Vancomycin. - Completed 7 days of Vancomycin.  Acute Kidney Injury with mild metabolic acidosis: - Creatinine increased from 2 to 3 mg/dL over the last few days.  Baseline ~ 1.1 mg/dL. - I/O (+) 4.7 L since admission.  UO is 650 CCs x 24 hours. - Increase IV Bumex to 2 mg BID. - 4/18 Abdominal CT shows no obstructive uropathy and no renal mass that was reported on ultrasound. - I do not recommend dialysis.  However, this option will be discussed with Nephrology and family.  Acute Toxic Metabolic Encephalopathy in the setting of Chronic Dementia - this is due to acute infection in the setting of worsening dementia: - Mental status waxes and wanes.  Peripheral Vascular Disease: - Hold Aspirin given anemia.  Paroxysmal Atrial Fibrillation: - Continue Metoprolol for rate control. - Hold anticoagulation due to high bleeding risk. - Monitor on telemetry.  Grade 1 Diastolic Heart Failure: 6/30 Echo showed  Grade 1 DD and normal EF. - Echo also showed small LV cavity with hyperdynamic function.  Continue beta blocker for afterload reduction.    Physical Deconditioning: - Since family is refusing comfort care, we will start looking at SNF facilities.  Severe Protein Calorie Malnutrition: - Albumin is 1.4 mmol/L.  Neurological Oropharyngeal Dysphagia: - Patient's ability to swallow changes day by day depending on her mental status. - Swallowing difficulties are a common sign of the end of life.  The dysphagia is secondary to worsening neurological dementia and loss of swallowing muscle mass both acutely worsened by multiple severe infections.   - We recommended comfort measures to the family, but family refuses at this time. - We will place NGT and start Tube Feeds today. - We will discuss PEG tube with family.  Chronic Anemia - B12 and IDA: HH is stable. Essential Hypertension: BP is stable. Hold off on meds. GERD: PPI Hypothyroidism: Synthroid 2010 Cecal Mass s/p right hemicolectomy  Goals of Care: Given worsening renal failure, ongoing anemia, poor nutritional status with albumin of 1.4 mmol/L, neurological dysphagia, large sacral wounds, and multiple complicated infections requiring lifelong antibiotics - we recommend comfort care measures.  We will continue daily discussions with family.  Palliative team is following up with family daily, appreciate.   Diet Order            DIET - DYS 1 Room service appropriate? No; Fluid consistency: Thin  Diet effective now                 Nutrition Problem: Increased nutrient needs Etiology: wound healing (3 stage 4 PI and a DTI to left heel) Signs/Symptoms: estimated needs Interventions: Ensure Enlive (each supplement provides 350kcal and 20 grams of protein),MVI,Juven,Prostat Patient's Body mass index is 37.44 kg/m.  Pressure Injury 03/15/2021 Sacrum Mid Stage 4 - Full thickness tissue loss with exposed bone, tendon or muscle. tunneling  (Active)  03/15/2021 2105  Location: Sacrum  Location Orientation: Mid  Staging: Stage 4 - Full thickness tissue loss with exposed bone, tendon or muscle.  Wound Description (Comments): tunneling  Present on Admission: Yes     Pressure Injury 03/15/2021 Sacrum Mid Stage 4 - Full thickness tissue loss with exposed bone, tendon or muscle. tunneling (Active)  02/23/2021 2106  Location: Sacrum  Location Orientation: Mid  Staging: Stage 4 - Full thickness tissue loss with exposed bone, tendon or muscle.  Wound Description (Comments): tunneling  Present on Admission: Yes     Pressure Injury 02/26/21 Ankle Right;Lateral Stage 4 - Full thickness tissue loss with exposed bone, tendon or muscle. (Active)  02/26/21 0202  Location: Ankle  Location Orientation: Right;Lateral  Staging: Stage 4 - Full thickness tissue loss with exposed bone, tendon or muscle.  Wound Description (Comments):   Present on Admission:      Pressure Injury 02/26/21 Foot Left;Lateral Deep Tissue Pressure Injury - Purple or maroon localized area of discolored intact skin or blood-filled blister due to damage of underlying soft tissue from pressure and/or shear. (Active)  02/26/21 0158  Location: Foot  Location Orientation: Left;Lateral  Staging: Deep Tissue Pressure Injury - Purple or maroon localized area of discolored intact skin or blood-filled blister due to damage of underlying soft tissue from pressure and/or shear.  Wound Description (Comments):   Present on Admission: Yes    DVT prophylaxis: heparin injection 5,000 Units Start: 03/07/21 1000 SCDs Start: 02/19/2021 1405 Code Status:   Code Status: Full Code  Family Communication: plan of care discussed with patient at bedside.  Status is: Inpatient Remains inpatient appropriate because:IV treatments appropriate due to intensity of illness or inability to take PO and Inpatient level of care appropriate due to severity of illness  Dispo: The patient is from: Home               Anticipated d/c is to: TBD SNF vs Hospice              Patient currently is not medically stable to d/c.   Difficult to place patient No Unresulted Labs (From admission, onward)          Start     Ordered   03/12/21 0500  CK  Every Monday (0500),   R     Question:  Specimen collection method  Answer:  IV Team=IV Team collect   03/06/21 0829   03/10/21 0500  Creatinine, serum  (enoxaparin (LOVENOX)  CrCl >/= 30 mL/min  )  Weekly,   R     Comments: while on enoxaparin therapy.   Question:  Specimen collection method  Answer:  Lab=Lab collect   02/16/2021 1404   03/04/21 0500  CBC  Daily,   R     Question:  Specimen collection method  Answer:  Lab=Lab collect   03/10/2021 1404   03/04/21 2992  Basic metabolic panel  Daily,   R     Question:  Specimen collection method  Answer:  Lab=Lab collect   03/06/2021 1322   03/04/21 0500  CBC with Differential/Platelet  Daily,   R     Question:  Specimen collection method  Answer:  Lab=Lab collect   02/17/2021 1322   Unscheduled  Occult blood card to lab, stool  As needed,   R      03/05/21 1409        Medications reviewed:  Scheduled Meds: . bumetanide (BUMEX) IV  2 mg Intravenous Q12H  . Chlorhexidine Gluconate Cloth  6 each Topical Daily  . cilostazol  100 mg Oral BID  . docusate sodium  100 mg Oral BID  . feeding supplement  237 mL Oral TID BM  . heparin injection (subcutaneous)  5,000 Units Subcutaneous Q12H  . levothyroxine  100 mcg Oral Q0600  . mouth rinse  15 mL Mouth Rinse BID  . megestrol  400 mg Oral Daily  . metoprolol tartrate  12.5 mg Oral BID  . mirtazapine  7.5 mg Oral QHS  . multivitamin with minerals  1 tablet Oral Daily  . nutrition supplement (JUVEN)  1 packet Oral BID BM  . pantoprazole (PROTONIX) IV  40 mg Intravenous Q12H  . senna-docusate  1 tablet Oral QHS  . sodium bicarbonate  1,300 mg Oral TID  . sodium chloride flush  10-40 mL Intracatheter Q12H  . vitamin B-12  1,000 mcg Oral Daily   Continuous  Infusions: . cefTRIAXone (ROCEPHIN)  IV Stopped (03/06/21 2255)  . DAPTOmycin (CUBICIN)  IV 500 mg (03/05/21 2117)    Consultants:see note  Procedures:see note  Antimicrobials: Anti-infectives (From admission, onward)   Start     Dose/Rate Route  Frequency Ordered Stop   03/05/21 2000  DAPTOmycin (CUBICIN) 500 mg in sodium chloride 0.9 % IVPB        500 mg 120 mL/hr over 30 Minutes Intravenous Every 48 hours 03/01/2021 1517     03/06/2021 2300  cefTRIAXone (ROCEPHIN) 2 g in sodium chloride 0.9 % 100 mL IVPB        2 g 200 mL/hr over 30 Minutes Intravenous Every 24 hours 02/23/2021 2236     03/01/2021 2000  DAPTOmycin (CUBICIN) 500 mg in sodium chloride 0.9 % IVPB  Status:  Discontinued        8 mg/kg  61.3 kg 120 mL/hr over 30 Minutes Intravenous Every 48 hours 03/02/21 0855 03/08/2021 1320   03/10/2021 1500  DAPTOmycin (CUBICIN) 600 mg in sodium chloride 0.9 % IVPB  Status:  Discontinued        6 mg/kg  99 kg 124 mL/hr over 30 Minutes Intravenous Daily 03/02/2021 1400 02/22/2021 1402   02/21/2021 1500  DAPTOmycin (CUBICIN) 500 mg in sodium chloride 0.9 % IVPB  Status:  Discontinued        500 mg 120 mL/hr over 30 Minutes Intravenous Daily 03/10/2021 1405 02/24/2021 1517   02/19/2021 1045  tobramycin (NEBCIN) powder  Status:  Discontinued          As needed 02/16/2021 1045 03/02/2021 1201   03/02/2021 1037  vancomycin (VANCOCIN) powder  Status:  Discontinued          As needed 03/02/2021 1044 03/17/2021 1201   02/22/2021 0800  vancomycin (VANCOCIN) IVPB 1000 mg/200 mL premix        1,000 mg 200 mL/hr over 60 Minutes Intravenous On call to O.R. 02/20/2021 0752 03/08/2021 1040   03/02/21 2200  cefTRIAXone (ROCEPHIN) injection 2 g  Status:  Discontinued        2 g Intramuscular Every 24 hours 03/02/21 0904 02/18/2021 2235   03/02/21 0600  ceFAZolin (ANCEF) IVPB 2g/100 mL premix  Status:  Discontinued        2 g 200 mL/hr over 30 Minutes Intravenous On call to O.R. 03/02/21 0110 02/16/2021 0559   02/27/21 2200  vancomycin  (VANCOREADY) IVPB 750 mg/150 mL  Status:  Discontinued        750 mg 150 mL/hr over 60 Minutes Intravenous Every 48 hours 03/13/2021 2342 03/02/21 0855   02/18/2021 2315  vancomycin (VANCOCIN) IVPB 1000 mg/200 mL premix        1,000 mg 200 mL/hr over 60 Minutes Intravenous  Once 03/07/2021 2313 02/26/21 0115   02/28/2021 2315  ceFEPIme (MAXIPIME) 2 g in sodium chloride 0.9 % 100 mL IVPB  Status:  Discontinued        2 g 200 mL/hr over 30 Minutes Intravenous Every 24 hours 03/06/2021 2313 03/02/21 0904     Culture/Microbiology    Component Value Date/Time   SDES FLUID 03/01/2021 1102   SPECREQUEST SYNOVIAL LEFT KNEE 03/01/2021 1102   CULT  03/01/2021 1102    NO GROWTH 3 DAYS Performed at [facility], [address], [place], [place] [address]    REPTSTATUS 03/05/2021 FINAL 03/01/2021 1102    Other culture-see note  Objective: Vitals: Today's Vitals   03/06/21 1523 03/06/21 1900 03/07/21 0625 03/07/21 1255  BP: 125/61 (!) 156/79 (!) 163/84 (!) 166/83  Pulse: 94 86 85 84  Resp: 18 18 18 20   Temp: 98.7 F (37.1 C) 98.3 F (36.8 C) 98.5 F (36.9 C)   TempSrc: Oral Axillary Axillary   SpO2:  99% 97% 100% 99%  Weight:      Height:      PainSc:        Intake/Output Summary (Last 24 hours) at 03/07/2021 1454 Last data filed at 03/07/2021 1400 Gross per 24 hour  Intake 628 ml  Output 700 ml  Net -72 ml   Filed Weights   02/26/21 0148 02/28/21 0500 03/02/21 1024  Weight: 61.2 kg 61.3 kg 99 kg   Weight change:   Intake/Output from previous day: 04/19 0701 - 04/20 0700 In: 378 [Blood:378] Out: 700 [Urine:650; Drains:50] Intake/Output this shift: Total I/O In: 250 [P.O.:250] Out: -  Filed Weights   02/26/21 0148 02/28/21 0500 03/02/21 1024  Weight: 61.2 kg 61.3 kg 99 kg    Examination: General exam: AAOx1-2, ill looking, elderly, frail and weak. HEENT:Oral mucosa moist, Ear/Nose WNL grossly, dentition normal. Respiratory system: bilaterally diminished,no wheezing or  crackles,no use of accessory muscle Cardiovascular system: S1 & S2 +, No JVD, no significant murmurs Gastrointestinal system: Abdomen soft, NT,ND, BS+ Nervous System:Alert, awake, moving extremities and grossly nonfocal Extremities: No edema, distal peripheral pulses palpable.  Left knee area is tender left lower extremity swollen Skin: sacral lesions noted MSK: physical deconditioning  Data Reviewed: I have personally reviewed following labs and imaging studies CBC: Recent Labs  Lab 03/04/21 0733 03/05/21 0323 03/05/21 1126 03/06/21 0305 03/06/21 1010 03/06/21 1800 03/07/21 0355  WBC 21.4* 17.6* 15.6* 13.5*  --   --  13.6*  NEUTROABS 18.1* 14.5*  --  10.9*  --   --  11.2*  HGB 7.3* 5.6* 7.0* 6.9* 6.8* 8.4* 8.5*  HCT 22.4* 17.5* 21.4* 21.6* 21.1* 25.3* 26.5*  MCV 90.3 93.6 88.4 87.8  --   --  88.3  PLT 220 182 187 197  --   --  893   Basic Metabolic Panel: Recent Labs  Lab 03/01/21 0211 03/02/21 0151 03/04/21 0740 03/05/21 0323 03/05/21 1206 03/05/21 1831 03/06/21 0305 03/07/21 0355  NA 133*   < > 142 140 142 138 139 142  K 4.6   < > 4.6 4.7 4.6 4.4 4.5 4.3  CL 106   < > 115* 114* 113* 112* 112* 114*  CO2 23   < > 20* 21* 21* 21* 23 19*  GLUCOSE 104*   < > 124* 114* 96 107* 84 75  BUN 68*   < > 82* 85* 85* 85* 84* 84*  CREATININE 2.07*   < > 2.48* 2.87* 2.93* 2.99* 3.06* 2.94*  CALCIUM 8.1*   < > 8.2* 8.5* 8.9 8.3* 8.3* 8.7*  PHOS 2.9  --  3.5  --   --   --   --   --    < > = values in this interval not displayed.   GFR: Estimated Creatinine Clearance: 14.8 mL/min (A) (by C-G formula based on SCr of 2.94 mg/dL (H)). Liver Function Tests: Recent Labs  Lab 03/01/21 0211 03/02/21 0151 02/21/2021 0103 03/04/21 0519 03/04/21 0740  AST  --  19 24 32  --   ALT  --  11 13 9   --   ALKPHOS  --  59 56 48  --   BILITOT  --  0.7 0.4 0.8  --   PROT  --  5.7* 5.8* 5.0*  --   ALBUMIN 1.6* 1.4* 1.4* 1.3* 1.4*   Coagulation Profile: No results for input(s): INR, PROTIME  in the last 168 hours. Cardiac Enzymes: Recent Labs  Lab 03/08/2021 0103 03/04/21 0519 03/06/21 0305  CKTOTAL 69 97 87   CBG: No results for input(s): GLUCAP in the last 168 hours. Sepsis Labs: Recent Labs  Lab 03/04/21 1715  LATICACIDVEN 1.2    Recent Results (from the past 240 hour(s))  Blood culture (routine single)     Status: None   Collection Time: 02/16/2021  8:57 PM   Specimen: Right Antecubital; Blood  Result Value Ref Range Status   Specimen Description RIGHT ANTECUBITAL  Final   Special Requests   Final    BOTTLES DRAWN AEROBIC AND ANAEROBIC Blood Culture adequate volume   Culture   Final    NO GROWTH 5 DAYS Performed at [facility], [address] [place], [place] [address]    Report Status 03/02/2021 FINAL  Final  Resp Panel by RT-PCR (Flu A&B, Covid) Nasopharyngeal Swab     Status: None   Collection Time: 02/23/2021  9:00 PM   Specimen: Nasopharyngeal Swab; Nasopharyngeal(NP) swabs in vial transport medium  Result Value Ref Range Status   SARS Coronavirus 2 by RT PCR NEGATIVE NEGATIVE Final    Comment: (NOTE) SARS-CoV-2 target nucleic acids are NOT DETECTED.  The SARS-CoV-2 RNA is generally detectable in upper respiratory specimens during the acute phase of infection. The lowest concentration of SARS-CoV-2 viral copies this assay can detect is 138 copies/mL. A negative result does not preclude SARS-Cov-2 infection and should not be used as the sole basis for treatment or other patient management decisions. A negative result may occur with  improper specimen collection/handling, submission of specimen other than nasopharyngeal swab, presence of viral mutation(s) within the areas targeted by this assay, and inadequate number of viral copies(<138 copies/mL). A negative result must be combined with clinical observations, patient history, and epidemiological information. The expected result is Negative.  Fact Sheet for Patients:   EntrepreneurPulse.com.au  Fact Sheet for Healthcare Providers:  IncredibleEmployment.be  This test is no t yet approved or cleared by the [place] FDA and  has been authorized for detection and/or diagnosis of SARS-CoV-2 by FDA under an Emergency Use Authorization (EUA). This EUA will remain  in effect (meaning this test can be used) for the duration of the COVID-19 declaration under Section 564(b)(1) of the Act, 21 U.S.C.section 360bbb-3(b)(1), unless the authorization is terminated  or revoked sooner.       Influenza A by PCR NEGATIVE NEGATIVE Final   Influenza B by PCR NEGATIVE NEGATIVE Final    Comment: (NOTE) The Xpert Xpress SARS-CoV-2/FLU/RSV plus assay is intended as an aid in the diagnosis of influenza from Nasopharyngeal swab specimens and should not be used as a sole basis for treatment. Nasal washings and aspirates are unacceptable for Xpert Xpress SARS-CoV-2/FLU/RSV testing.  Fact Sheet for Patients: EntrepreneurPulse.com.au  Fact Sheet for Healthcare Providers: IncredibleEmployment.be  This test is not yet approved or cleared by the [place] FDA and has been authorized for detection and/or diagnosis of SARS-CoV-2 by FDA under an Emergency Use Authorization (EUA). This EUA will remain in effect (meaning this test can be used) for the duration of the COVID-19 declaration under Section 564(b)(1) of the Act, 21 U.S.C. section 360bbb-3(b)(1), unless the authorization is terminated or revoked.  Performed at [facility], [address], [place], [place] [address]   Urine culture     Status: Abnormal   Collection Time: 03/02/2021 11:57 PM   Specimen: In/Out Cath Urine  Result Value Ref Range Status   Specimen Description   Final    IN/OUT CATH URINE Performed at [facility], [address], [place], [place] [address]    Special Requests   Final    NONE Performed at [facility], [address], [place], [place] [address]    Culture 80,000 COLONIES/mL ENTEROCOCCUS AVIUM (A)  Final   Report Status 03/01/2021 FINAL  Final   Organism ID, Bacteria ENTEROCOCCUS AVIUM (A)  Final      Susceptibility   Enterococcus avium - MIC*    AMPICILLIN 16 RESISTANT Resistant     NITROFURANTOIN <=16 SENSITIVE Sensitive     VANCOMYCIN <=0.5 SENSITIVE Sensitive     * 80,000 COLONIES/mL ENTEROCOCCUS AVIUM  Body fluid culture w Gram Stain     Status: None   Collection Time: 03/01/21 11:02 AM   Specimen: Body Fluid  Result Value Ref Range Status   Specimen Description FLUID  Final   Special Requests SYNOVIAL LEFT KNEE  Final   Gram Stain   Final    ABUNDANT WBC PRESENT, PREDOMINANTLY MONONUCLEAR ABUNDANT GRAM POSITIVE COCCI IN PAIRS IN CHAINS    Culture   Final    NO GROWTH 3 DAYS Performed at [facility], [place] [address], [place], [place] [address]    Report Status 03/05/2021 FINAL  Final     Radiology Studies: CT ABDOMEN PELVIS WO CONTRAST  Result Date: 03/06/2021 CLINICAL DATA:  87 year old female with history of renal mass. Renal insufficiency. CT the abdomen EXAM: CT ABDOMEN AND PELVIS WITHOUT CONTRAST TECHNIQUE: Multidetector CT imaging of the abdomen and pelvis was performed following the standard protocol without IV contrast. COMPARISON:  CT the abdomen and pelvis 04/16/2009. FINDINGS: Comment: Today's study is limited for detection and characterization of visceral and/or vascular lesions by lack of IV contrast. Lower chest: Small bilateral pleural effusions with what appears to be areas of dependent subsegmental atelectasis in the lower lobes of the lungs bilaterally. Atherosclerotic calcifications in the descending thoracic aorta as well as the left anterior descending, left circumflex and right coronary arteries. Hepatobiliary: No suspicious cystic or solid hepatic lesions are confidently identified on today's noncontrast CT examination. Unenhanced appearance of the  gallbladder is unremarkable. Pancreas: No definite pancreatic mass or peripancreatic fluid collections or inflammatory changes are noted on today's noncontrast CT examination. Spleen: Unremarkable. Adrenals/Urinary Tract: No definite renal mass confidently identified on today's noncontrast CT examination. No hydroureteronephrosis. Urinary bladder is decompressed around an indwelling Foley balloon catheter. Bilateral adrenal glands are normal in appearance. Stomach/Bowel: Unenhanced appearance of the stomach is normal. No pathologic dilatation of small bowel or colon. Status post appendectomy. Vascular/Lymphatic: Aortic atherosclerosis. No lymphadenopathy noted in the abdomen or pelvis. Reproductive: Status post hysterectomy.  Ovaries are a trophic. Other: No significant volume of ascites.  No pneumoperitoneum. Musculoskeletal: Diffuse body wall edema. There are no aggressive appearing lytic or blastic lesions noted in the visualized portions of the skeleton. IMPRESSION: 1. Limited study secondary to lack of IV contrast. No renal mass confidently identified on today's noncontrast examination. 2. Diffuse body wall edema and small bilateral pleural effusions; imaging findings that may suggest a state of anasarca. 3. Aortic atherosclerosis, in addition to at least 3 vessel coronary artery disease. 4. Additional incidental findings, as above. Electronically Signed   By: Vinnie Langton M.D.   On: 03/06/2021 08:07   MR ANKLE RIGHT WO CONTRAST  Result Date: 03/07/2021 CLINICAL DATA:  Right lateral ankle wound. Evaluate for osteomyelitis. EXAM: MRI OF THE RIGHT ANKLE WITHOUT CONTRAST TECHNIQUE: Multiplanar, multisequence MR imaging of the ankle was performed. No intravenous contrast was administered. COMPARISON:  Right  foot x-rays dated January 10, 2021. Right ankle x-rays dated January 08, 2021. FINDINGS: TENDONS Peroneal: Peroneal longus tendon intact. Peroneal brevis intact. Posteromedial: Posterior tibial tendon  intact. Flexor digitorum longus tendon intact. Flexor hallucis longus tendon intact. Anterior: Tibialis anterior tendon intact. Extensor hallucis longus tendon intact Extensor digitorum longus tendon intact. Achilles:  Intact. Plantar Fascia: Intact. Mild thickening of the proximal central band. LIGAMENTS Lateral: Anterior talofibular ligament intact. Calcaneofibular ligament intact. Posterior talofibular ligament intact. Anterior and posterior tibiofibular ligaments intact. Medial: Deltoid ligament intact. Spring ligament intact. CARTILAGE Ankle Joint: No significant joint effusion. Normal ankle mortise. No chondral defect. Subtalar Joints/Sinus Tarsi: Normal subtalar joints. No subtalar joint effusion. Normal sinus tarsi. Bones: Mild marrow edema within the lateral malleolus. T1 marrow signal is mostly preserved. No acute fracture or dislocation. No suspicious bone lesion. Mild navicular-medial cuneiform osteoarthritis. Soft Tissue: Near circumferential lower leg and hindfoot soft tissue swelling extending into the dorsolateral forefoot, sparing the medial aspect. Soft tissue ulceration over the lateral malleolus. No fluid collection. IMPRESSION: 1. Soft tissue ulceration over the lateral malleolus with underlying mild marrow edema, suspicious for early osteomyelitis. No abscess. Electronically Signed   By: Titus Dubin M.D.   On: 03/07/2021 12:13     LOS: 10 days   George Hugh, MD [facility]  03/07/2021, 2:54 PM

## 2021-03-07 NOTE — Progress Notes (Signed)
Subjective:  Patient is unresponsive   Antibiotics:  Anti-infectives (From admission, onward)   Start     Dose/Rate Route Frequency Ordered Stop   03/05/21 2000  DAPTOmycin (CUBICIN) 500 mg in sodium chloride 0.9 % IVPB        500 mg 120 mL/hr over 30 Minutes Intravenous Every 48 hours 03/07/2021 1517     03/02/2021 2300  cefTRIAXone (ROCEPHIN) 2 g in sodium chloride 0.9 % 100 mL IVPB        2 g 200 mL/hr over 30 Minutes Intravenous Every 24 hours 03/10/2021 2236     02/23/2021 2000  DAPTOmycin (CUBICIN) 500 mg in sodium chloride 0.9 % IVPB  Status:  Discontinued        8 mg/kg  61.3 kg 120 mL/hr over 30 Minutes Intravenous Every 48 hours 03/02/21 0855 02/22/2021 1320   03/04/2021 1500  DAPTOmycin (CUBICIN) 600 mg in sodium chloride 0.9 % IVPB  Status:  Discontinued        6 mg/kg  99 kg 124 mL/hr over 30 Minutes Intravenous Daily 02/23/2021 1400 03/07/2021 1402   02/23/2021 1500  DAPTOmycin (CUBICIN) 500 mg in sodium chloride 0.9 % IVPB  Status:  Discontinued        500 mg 120 mL/hr over 30 Minutes Intravenous Daily 02/22/2021 1405 03/10/2021 1517   03/10/2021 1045  tobramycin (NEBCIN) powder  Status:  Discontinued          As needed 02/16/2021 1045 03/14/2021 1201   02/18/2021 1037  vancomycin (VANCOCIN) powder  Status:  Discontinued          As needed 02/28/2021 1044 03/01/2021 1201   02/16/2021 0800  vancomycin (VANCOCIN) IVPB 1000 mg/200 mL premix        1,000 mg 200 mL/hr over 60 Minutes Intravenous On call to O.R. 02/24/2021 0752 02/27/2021 1040   03/02/21 2200  cefTRIAXone (ROCEPHIN) injection 2 g  Status:  Discontinued        2 g Intramuscular Every 24 hours 03/02/21 0904 03/05/2021 2235   03/02/21 0600  ceFAZolin (ANCEF) IVPB 2g/100 mL premix  Status:  Discontinued        2 g 200 mL/hr over 30 Minutes Intravenous On call to O.R. 03/02/21 0110 03/06/2021 0559   02/27/21 2200  vancomycin (VANCOREADY) IVPB 750 mg/150 mL  Status:  Discontinued        750 mg 150 mL/hr over 60 Minutes Intravenous Every  48 hours 03/15/2021 2342 03/02/21 0855   02/18/2021 2315  vancomycin (VANCOCIN) IVPB 1000 mg/200 mL premix        1,000 mg 200 mL/hr over 60 Minutes Intravenous  Once 03/16/2021 2313 02/26/21 0115   03/01/2021 2315  ceFEPIme (MAXIPIME) 2 g in sodium chloride 0.9 % 100 mL IVPB  Status:  Discontinued        2 g 200 mL/hr over 30 Minutes Intravenous Every 24 hours 03/12/2021 2313 03/02/21 0904      Medications: Scheduled Meds: . bumetanide (BUMEX) IV  2 mg Intravenous Q12H  . Chlorhexidine Gluconate Cloth  6 each Topical Daily  . cilostazol  100 mg Oral BID  . docusate sodium  100 mg Oral BID  . feeding supplement  237 mL Oral TID BM  . heparin injection (subcutaneous)  5,000 Units Subcutaneous Q12H  . levothyroxine  100 mcg Oral Q0600  . mouth rinse  15 mL Mouth Rinse BID  . megestrol  400 mg Oral Daily  . metoprolol tartrate  12.5  mg Oral BID  . mirtazapine  7.5 mg Oral QHS  . multivitamin with minerals  1 tablet Oral Daily  . nutrition supplement (JUVEN)  1 packet Oral BID BM  . pantoprazole (PROTONIX) IV  40 mg Intravenous Q12H  . polyethylene glycol  17 g Oral Daily  . senna-docusate  1 tablet Oral QHS  . sodium bicarbonate  1,300 mg Oral TID  . sodium chloride flush  10-40 mL Intracatheter Q12H  . vitamin B-12  1,000 mcg Oral Daily   Continuous Infusions: . cefTRIAXone (ROCEPHIN)  IV Stopped (03/06/21 2255)  . DAPTOmycin (CUBICIN)  IV 500 mg (03/05/21 2117)   PRN Meds:.acetaminophen, alum & mag hydroxide-simeth, haloperidol lactate, menthol-cetylpyridinium **OR** phenol, metoCLOPramide **OR** metoCLOPramide (REGLAN) injection, metoprolol tartrate, ondansetron **OR** ondansetron (ZOFRAN) IV, sodium chloride flush    Objective: Weight change:   Intake/Output Summary (Last 24 hours) at 03/07/2021 1536 Last data filed at 03/07/2021 1400 Gross per 24 hour  Intake 250 ml  Output 700 ml  Net -450 ml   Blood pressure (!) 166/83, pulse 84, temperature 98.5 F (36.9 C), temperature  source Axillary, resp. rate 20, height 5' 4.02" (1.626 m), weight 99 kg, SpO2 99 %. Temp:  [98.3 F (36.8 C)-98.5 F (36.9 C)] 98.5 F (36.9 C) (04/20 0625) Pulse Rate:  [84-86] 84 (04/20 1255) Resp:  [18-20] 20 (04/20 1255) BP: (156-166)/(79-84) 166/83 (04/20 1255) SpO2:  [97 %-100 %] 99 % (04/20 1255)  Physical Exam: Physical Exam Constitutional:      General: She is not in acute distress.    Appearance: She is well-developed. She is ill-appearing. She is not diaphoretic.  HENT:     Head: Normocephalic and atraumatic.     Right Ear: External ear normal.     Left Ear: External ear normal.     Mouth/Throat:     Pharynx: No oropharyngeal exudate.  Eyes:     General: No scleral icterus. Cardiovascular:     Rate and Rhythm: Normal rate and regular rhythm.     Heart sounds: Normal heart sounds. No murmur heard. No friction rub. No gallop.   Pulmonary:     Effort: Pulmonary effort is normal. No respiratory distress.     Breath sounds: Normal breath sounds. No wheezing or rales.  Abdominal:     General: Bowel sounds are normal. There is no distension.     Palpations: Abdomen is soft.     Tenderness: There is no abdominal tenderness. There is no rebound.  Musculoskeletal:        General: No tenderness. Normal range of motion.  Lymphadenopathy:     Cervical: No cervical adenopathy.  Skin:    General: Skin is warm and dry.     Coloration: Skin is pale.     Findings: No erythema or rash.  Neurological:     Mental Status: She is disoriented.     Motor: No abnormal muscle tone.     Coordination: Coordination normal.  Psychiatric:        Speech: She is noncommunicative.        Cognition and Memory: Cognition is impaired. Memory is impaired. She exhibits impaired recent memory.     Knee with dressing in place CBC:    BMET Recent Labs    03/06/21 0305 03/07/21 0355  NA 139 142  K 4.5 4.3  CL 112* 114*  CO2 23 19*  GLUCOSE 84 75  BUN 84* 84*  CREATININE 3.06* 2.94*   CALCIUM 8.3* 8.7*  Liver Panel  No results for input(s): PROT, ALBUMIN, AST, ALT, ALKPHOS, BILITOT, BILIDIR, IBILI in the last 72 hours.     Sedimentation Rate No results for input(s): ESRSEDRATE in the last 72 hours. C-Reactive Protein No results for input(s): CRP in the last 72 hours.  Micro Results: Recent Results (from the past 720 hour(s))  Blood culture (routine single)     Status: None   Collection Time: 02/22/2021  8:57 PM   Specimen: Right Antecubital; Blood  Result Value Ref Range Status   Specimen Description RIGHT ANTECUBITAL  Final   Special Requests   Final    BOTTLES DRAWN AEROBIC AND ANAEROBIC Blood Culture adequate volume   Culture   Final    NO GROWTH 5 DAYS Performed at [facility] [address] [place], [place] [address]    Report Status 03/02/2021 FINAL  Final  Resp Panel by RT-PCR (Flu A&B, Covid) Nasopharyngeal Swab     Status: None   Collection Time: 03/15/2021  9:00 PM   Specimen: Nasopharyngeal Swab; Nasopharyngeal(NP) swabs in vial transport medium  Result Value Ref Range Status   SARS Coronavirus 2 by RT PCR NEGATIVE NEGATIVE Final    Comment: (NOTE) SARS-CoV-2 target nucleic acids are NOT DETECTED.  The SARS-CoV-2 RNA is generally detectable in upper respiratory specimens during the acute phase of infection. The lowest concentration of SARS-CoV-2 viral copies this assay can detect is 138 copies/mL. A negative result does not preclude SARS-Cov-2 infection and should not be used as the sole basis for treatment or other patient management decisions. A negative result may occur with  improper specimen collection/handling, submission of specimen other than nasopharyngeal swab, presence of viral mutation(s) within the areas targeted by this assay, and inadequate number of viral copies(<138 copies/mL). A negative result must be combined with clinical observations, patient history, and epidemiological information. The expected result is  Negative.  Fact Sheet for Patients:  EntrepreneurPulse.com.au  Fact Sheet for Healthcare Providers:  IncredibleEmployment.be  This test is no t yet approved or cleared by the [place] FDA and  has been authorized for detection and/or diagnosis of SARS-CoV-2 by FDA under an Emergency Use Authorization (EUA). This EUA will remain  in effect (meaning this test can be used) for the duration of the COVID-19 declaration under Section 564(b)(1) of the Act, 21 U.S.C.section 360bbb-3(b)(1), unless the authorization is terminated  or revoked sooner.       Influenza A by PCR NEGATIVE NEGATIVE Final   Influenza B by PCR NEGATIVE NEGATIVE Final    Comment: (NOTE) The Xpert Xpress SARS-CoV-2/FLU/RSV plus assay is intended as an aid in the diagnosis of influenza from Nasopharyngeal swab specimens and should not be used as a sole basis for treatment. Nasal washings and aspirates are unacceptable for Xpert Xpress SARS-CoV-2/FLU/RSV testing.  Fact Sheet for Patients: EntrepreneurPulse.com.au  Fact Sheet for Healthcare Providers: IncredibleEmployment.be  This test is not yet approved or cleared by the [place] FDA and has been authorized for detection and/or diagnosis of SARS-CoV-2 by FDA under an Emergency Use Authorization (EUA). This EUA will remain in effect (meaning this test can be used) for the duration of the COVID-19 declaration under Section 564(b)(1) of the Act, 21 U.S.C. section 360bbb-3(b)(1), unless the authorization is terminated or revoked.  Performed at [facility], [address], [place], [place] [address]   Urine culture     Status: Abnormal   Collection Time: 03/02/2021 11:57 PM   Specimen: In/Out Cath Urine  Result Value Ref Range  Status   Specimen Description   Final    IN/OUT CATH URINE Performed at [facility], [address], [place], [place] [address]    Special Requests   Final     NONE Performed at [facility], [address], [place], [place] [address]    Culture 80,000 COLONIES/mL ENTEROCOCCUS AVIUM (A)  Final   Report Status 03/01/2021 FINAL  Final   Organism ID, Bacteria ENTEROCOCCUS AVIUM (A)  Final      Susceptibility   Enterococcus avium - MIC*    AMPICILLIN 16 RESISTANT Resistant     NITROFURANTOIN <=16 SENSITIVE Sensitive     VANCOMYCIN <=0.5 SENSITIVE Sensitive     * 80,000 COLONIES/mL ENTEROCOCCUS AVIUM  Body fluid culture w Gram Stain     Status: None   Collection Time: 03/01/21 11:02 AM   Specimen: Body Fluid  Result Value Ref Range Status   Specimen Description FLUID  Final   Special Requests SYNOVIAL LEFT KNEE  Final   Gram Stain   Final    ABUNDANT WBC PRESENT, PREDOMINANTLY MONONUCLEAR ABUNDANT GRAM POSITIVE COCCI IN PAIRS IN CHAINS    Culture   Final    NO GROWTH 3 DAYS Performed at [facility], [place] [address] [place], [place] [address]    Report Status 03/05/2021 FINAL  Final    Studies/Results: CT ABDOMEN PELVIS WO CONTRAST  Result Date: 03/06/2021 CLINICAL DATA:  87 year old female with history of renal mass. Renal insufficiency. CT the abdomen EXAM: CT ABDOMEN AND PELVIS WITHOUT CONTRAST TECHNIQUE: Multidetector CT imaging of the abdomen and pelvis was performed following the standard protocol without IV contrast. COMPARISON:  CT the abdomen and pelvis 04/16/2009. FINDINGS: Comment: Today's study is limited for detection and characterization of visceral and/or vascular lesions by lack of IV contrast. Lower chest: Small bilateral pleural effusions with what appears to be areas of dependent subsegmental atelectasis in the lower lobes of the lungs bilaterally. Atherosclerotic calcifications in the descending thoracic aorta as well as the left anterior descending, left circumflex and right coronary arteries. Hepatobiliary: No suspicious cystic or solid hepatic lesions are confidently identified on today's noncontrast CT examination.  Unenhanced appearance of the gallbladder is unremarkable. Pancreas: No definite pancreatic mass or peripancreatic fluid collections or inflammatory changes are noted on today's noncontrast CT examination. Spleen: Unremarkable. Adrenals/Urinary Tract: No definite renal mass confidently identified on today's noncontrast CT examination. No hydroureteronephrosis. Urinary bladder is decompressed around an indwelling Foley balloon catheter. Bilateral adrenal glands are normal in appearance. Stomach/Bowel: Unenhanced appearance of the stomach is normal. No pathologic dilatation of small bowel or colon. Status post appendectomy. Vascular/Lymphatic: Aortic atherosclerosis. No lymphadenopathy noted in the abdomen or pelvis. Reproductive: Status post hysterectomy.  Ovaries are a trophic. Other: No significant volume of ascites.  No pneumoperitoneum. Musculoskeletal: Diffuse body wall edema. There are no aggressive appearing lytic or blastic lesions noted in the visualized portions of the skeleton. IMPRESSION: 1. Limited study secondary to lack of IV contrast. No renal mass confidently identified on today's noncontrast examination. 2. Diffuse body wall edema and small bilateral pleural effusions; imaging findings that may suggest a state of anasarca. 3. Aortic atherosclerosis, in addition to at least 3 vessel coronary artery disease. 4. Additional incidental findings, as above. Electronically Signed   By: Vinnie Langton M.D.   On: 03/06/2021 08:07   MR ANKLE RIGHT WO CONTRAST  Result Date: 03/07/2021 CLINICAL DATA:  Right lateral ankle wound. Evaluate for osteomyelitis. EXAM: MRI OF THE RIGHT ANKLE WITHOUT CONTRAST  TECHNIQUE: Multiplanar, multisequence MR imaging of the ankle was performed. No intravenous contrast was administered. COMPARISON:  Right foot x-rays dated January 10, 2021. Right ankle x-rays dated January 08, 2021. FINDINGS: TENDONS Peroneal: Peroneal longus tendon intact. Peroneal brevis intact.  Posteromedial: Posterior tibial tendon intact. Flexor digitorum longus tendon intact. Flexor hallucis longus tendon intact. Anterior: Tibialis anterior tendon intact. Extensor hallucis longus tendon intact Extensor digitorum longus tendon intact. Achilles:  Intact. Plantar Fascia: Intact. Mild thickening of the proximal central band. LIGAMENTS Lateral: Anterior talofibular ligament intact. Calcaneofibular ligament intact. Posterior talofibular ligament intact. Anterior and posterior tibiofibular ligaments intact. Medial: Deltoid ligament intact. Spring ligament intact. CARTILAGE Ankle Joint: No significant joint effusion. Normal ankle mortise. No chondral defect. Subtalar Joints/Sinus Tarsi: Normal subtalar joints. No subtalar joint effusion. Normal sinus tarsi. Bones: Mild marrow edema within the lateral malleolus. T1 marrow signal is mostly preserved. No acute fracture or dislocation. No suspicious bone lesion. Mild navicular-medial cuneiform osteoarthritis. Soft Tissue: Near circumferential lower leg and hindfoot soft tissue swelling extending into the dorsolateral forefoot, sparing the medial aspect. Soft tissue ulceration over the lateral malleolus. No fluid collection. IMPRESSION: 1. Soft tissue ulceration over the lateral malleolus with underlying mild marrow edema, suspicious for early osteomyelitis. No abscess. Electronically Signed   By: Titus Dubin M.D.   On: 03/07/2021 12:13      Assessment/Plan:  INTERVAL HISTORY:   MRI of right heel shows osteomyelitis read on MRI of the sacrum is pending  Principal Problem:   Severe Sepsis due to left leg cellulitis and possible Lt septic knee and Enterococcus UTI Active Problems:   Essential hypertension   PERIPHERAL VASCULAR DISEASE   GERD   Hypothyroidism   AKI (acute kidney injury) ([facility])   Pressure injury of skin   Generalized weakness   Acute metabolic encephalopathy   Leukocytosis   Hyponatremia   Dehydration   Hypoalbuminemia    Elevated brain natriuretic peptide (BNP) level   Paroxysmal A-fib (HCC)   Altered mental status   Septic infrapatellar bursitis of left knee   Infection of total knee replacement (HCC)   Medication monitoring encounter    ROSALENE WARDROP is a 87 y.o. female with history of recent distal femur fracture status post still femoral replacement discharged to skilled nursing facility but readmitted now with periprosthetic joint infection as well as to stage IV decubitus ulcers one in her sacral area and 1 in her ankle.  Blood cultures were taken on admission and have not yielded any organisms.  She was taken to the operating room  while on vancomycin and ceftriaxone.  Cultures did not yield any organisms though the gram stain showed gram-positive cocci in pairs and chains  She has acute on chronic renal insufficiency (latter is obvious from her prior elevated creatinines  She continues to be encephalopathic  #1 Goals of care: Greatly appreciate palliative care seeing her.  Not seem that the family have very realistic expectations and I really think all of the aggressive care the patient is receiving is not helping to improve her experience of life now or for the foreseeable future  #2  Prosthetic knee infection: Continue daptomycin and ceftriaxone for now.   #3  Deep stage IV decubitus ulcer of sacral area and foot:  MRI of the ankle shows osteomyelitis.  To treat this she would need amputation below the knee.  Think she is healthy to undergo such a surgery  MRI of the sacral area what undoubtedly show osteomyelitis as well.  The fact that she developed these decubitus ulcer should be clear-cut evidence for [place] that the patient's trajectory has been downward for some time and is not going to be reversible.   #4 acute on chronic renal failure: Dr Justin Mend is following. Patient not making much urine.She has largely been off vancomycin since I first saw her though I do believe her cement from  surgery contains vancomycin and tobramycin and these drugs could have made an impact on her renal function            LOS: 10 days   Alcide Evener 03/07/2021, 3:36 PM

## 2021-03-07 NOTE — Progress Notes (Signed)
PT Cancellation Note  Patient Details Name: ATALAYA ZAPPIA MRN: [id number] DOB: Mar 08, 1929   Cancelled Treatment:    Reason Eval/Treat Not Completed: Patient at procedure or test/unavailable   Pt off the unit. Will reattempt as schedule permits.   Arby Barrette, PT Pager [phone]   Rexanne Mano 03/07/2021, 8:26 AM

## 2021-03-07 NOTE — Evaluation (Signed)
Occupational Therapy Evaluation Patient Details Name: [name] MRN: [id number] DOB: 06/29/1929 Today's Date: 03/07/2021    History of Present Illness [name] is a 87 y/o female who presented to ED at [facility] on 4/10 with complaints of weakness, decreased appeptite, and confusion. Pt found to have sepsis due to L leg cellulitis and septic knee. Pt transfered to [facility] on 4/14. Was admitted on 01/14/21 for L distal femur fx repair and underwent L TKA. I&D with TKA resection on 03/05/2021. 4/19 Palliative consult with pt/family desiring full, aggressive care. 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   Clinical Impression   Pt PTA:  Pt home from SNF briefly before requiring transfer to here. Pt currently limited by decreased strength, decreased activity tolerance and decreased ability to care for self. Pt recent d/c from SNF and family wants to take pt home, but pt minA to dependent for ADL tasks. Pt maxA to [place] for bed mobility. Pt education for use of BUEs and elevation. Pt mumbled speech so difficult to understand pt. Pt would benefit from continued OT skilled services. OT following acutely.    Follow Up Recommendations  SNF;Supervision/Assistance - 24 hour    Equipment Recommendations  None recommended by OT    Recommendations for Other Services       Precautions / Restrictions Precautions Precautions: Fall Precaution Comments: JP drain, L hemovac, L wound vac, watch BP (tends to run low), dementia, difficult to understand speech Required Braces or Orthoses: Knee Immobilizer - Left Knee Immobilizer - Left: On at all times Restrictions Weight Bearing Restrictions: Yes LLE Weight Bearing: Weight bearing as tolerated Other Position/Activity Restrictions: Knee immobilizer on at all times      Mobility Bed Mobility Overal bed mobility: Needs Assistance             General bed mobility comments: totalA +2; only +1 assist  avaiable today.    Transfers                 General transfer comment: deferred; staff to use hoyer    Balance                                           ADL either performed or assessed with clinical judgement   ADL Overall ADL's : Needs assistance/impaired Eating/Feeding: NPO   Grooming: Minimal assistance;Bed level Grooming Details (indicate cue type and reason): washing face with set-upA with R hand Upper Body Bathing: Moderate assistance;Bed level   Lower Body Bathing: Total assistance;Bed level   Upper Body Dressing : Moderate assistance;Bed level   Lower Body Dressing: Total assistance;Bed level   Toilet Transfer: Total assistance;+2 for physical assistance;+2 for safety/equipment Toilet Transfer Details (indicate cue type and reason): hoyer; use of foley and bed pan at this time Toileting- Water quality scientist and Hygiene: Total assistance;Bed level       Functional mobility during ADLs: Total assistance;+2 for physical assistance;+2 for safety/equipment (useof hoyer lift for staff) General ADL Comments: Pt limited by decreased strength, decreased activity tolerance and decreased ability to care for self. Pt recent d/c from SNF and family wants to take pt home, but pt minA to dependent for ADL tasks.     Vision Baseline Vision/History: Wears glasses Patient Visual Report: Other (comment) (has glasses, but was not wearing them) Vision Assessment?: No apparent visual deficits  Additional Comments: scanning room     Perception     Praxis      Pertinent Vitals/Pain Pain Assessment: Faces Faces Pain Scale: Hurts little more Pain Location: LUE > RUE pain Pain Descriptors / Indicators: Moaning;Grimacing;Guarding Pain Intervention(s): Monitored during session;Premedicated before session;Repositioned     Hand Dominance Right   Extremity/Trunk Assessment Upper Extremity Assessment Upper Extremity Assessment: Generalized weakness;RUE  deficits/detail;LUE deficits/detail RUE Deficits / Details: edema, AROM, WFLs, poor grip strength 3-/5 MM grade LUE Deficits / Details: edema, AROM, WFLs, poor grip strength 3-/5 MM grade   Lower Extremity Assessment Lower Extremity Assessment: Defer to PT evaluation;Generalized weakness   Cervical / Trunk Assessment Cervical / Trunk Assessment: Other exceptions Cervical / Trunk Exceptions: large body habitus   Communication Communication Communication: Expressive difficulties;Other (comment)   Cognition Arousal/Alertness: Awake/alert Behavior During Therapy: Flat affect Overall Cognitive Status: Difficult to assess Area of Impairment: Orientation;Following commands;Awareness                 Orientation Level: Disoriented to;Place;Time;Situation Current Attention Level: Sustained   Following Commands: Follows one step commands consistently     Problem Solving: Requires verbal cues General Comments: Pt difficult to understand, may help if dentures are in for next sessions.   General Comments  99% O2 on 2L cues to keep O2 on as pt wanting to pull it out    Exercises Exercises: General Upper Extremity General Exercises - Upper Extremity Shoulder Flexion: AAROM;Both;10 reps;Supine Shoulder ABduction: AAROM;Both;10 reps;Supine Elbow Flexion: AAROM;Both;10 reps;Supine Elbow Extension: AAROM;Both;10 reps;Supine Wrist Flexion: AAROM;Both;10 reps;Supine Wrist Extension: AAROM;Both;10 reps;Supine Digit Composite Flexion: AAROM;Both;10 reps;Supine General Exercises - Lower Extremity Ankle Circles/Pumps: AROM;Both;5 reps Heel Slides:  (resisted extension)   Shoulder Instructions      Home Living Family/patient expects to be discharged to:: Private residence Living Arrangements: Children Available Help at Discharge: Family;Available 24 hours/day Type of Home: House Home Access: Ramped entrance     Home Layout: One level;Able to live on main level with  bedroom/bathroom         Bathroom Toilet: Standard     Home Equipment: Walker - 2 wheels;Hospital bed;Wheelchair - manual;Bedside commode          Prior Functioning/Environment Level of Independence: Needs assistance        Comments: Pt was at SNF for 18 days before d/c home. Living with daugther. Daughter reports that pt was walking short distances at SNF, but at home they wanted to wait until home health PT came to start walking at home again. Pt mainly in bed and sometimes moving to wheelchair. Unclear how pt was transferring to wheelchair. Daughter providing assistance to dress and sponge bathe.        OT Problem List: Decreased strength;Decreased activity tolerance;Decreased range of motion;Impaired balance (sitting and/or standing);Decreased coordination;Decreased safety awareness;Pain;Increased edema;Impaired UE functional use;Cardiopulmonary status limiting activity;Impaired vision/perception      OT Treatment/Interventions: Self-care/ADL training;Therapeutic exercise;Neuromuscular education;Energy conservation;DME and/or AE instruction;Therapeutic activities;Patient/family education;Balance training    OT Goals(Current goals can be found in the care plan section) Acute Rehab OT Goals Patient Stated Goal: none stated OT Goal Formulation: Patient unable to participate in goal setting Time For Goal Achievement: 03/21/21 Potential to Achieve Goals: Fair ADL Goals Pt Will Perform Eating: with min assist;with adaptive utensils;sitting;bed level Pt Will Perform Grooming: with set-up;sitting;bed level Pt/caregiver will Perform Home Exercise Program: Increased strength;Both right and left upper extremity;With minimal assist;With written HEP provided Additional ADL Goal #1: Pt will increase to x5 mins sitting EOB  with modA to increase sitting balance tolerance.  OT Frequency: Min 2X/week   Barriers to D/C:            Co-evaluation              AM-PAC OT "6 Clicks"  Daily Activity     Outcome Measure Help from another person eating meals?: Total Help from another person taking care of personal grooming?: A Lot Help from another person toileting, which includes using toliet, bedpan, or urinal?: Total Help from another person bathing (including washing, rinsing, drying)?: A Lot Help from another person to put on and taking off regular upper body clothing?: A Lot Help from another person to put on and taking off regular lower body clothing?: Total 6 Click Score: 9   End of Session Equipment Utilized During Treatment: Oxygen Nurse Communication: Mobility status  Activity Tolerance: Treatment limited secondary to medical complications (Comment) Patient left: in bed;with call bell/phone within reach;with bed alarm set  OT Visit Diagnosis: Unsteadiness on feet (R26.81);Muscle weakness (generalized) (M62.81);Pain Pain - Right/Left: Left Pain - part of body: Arm (generalized)                Time: 1435-1450 OT Time Calculation (min): 15 min Charges:  OT General Charges $OT Visit: 1 Visit OT Evaluation $OT Eval Low Complexity: 1 Low  Jefferey Pica, OTR/L Acute Rehabilitation Services Pager: [phone] Office: [phone]'   Kostas Marrow C 03/07/2021, 5:32 PM

## 2021-03-07 NOTE — Progress Notes (Signed)
[facility] ROUNDING NOTE   Subjective:   Brief history: This is an 87 year old lady with a history of hyperlipidemia peripheral vascular disease atrial fibrillation hypertension hypothyroidism who was admitted with a prosthetic joint infection.  She was treated with vancomycin.  Baseline creatinine is 0.8 to 1 mg/dL.  She has developed acute kidney injury with a creatinine 2.87 mg/dL 03/05/2021.  She is oliguric with only 250 cc of urine 03/05/2021  Blood pressure 160/84 pulse 104 temperature 98.5  Current medications: Cilostazol 100 mg twice daily, levothyroxine 100 mcg daily, metoprolol 12.5 mg twice daily, Protonix 40 mg every 12 hours, sodium bicarbonate 1.3 g twice daily.  IV Rocephin 2 g every 24 hours, daptomycin 500 mg every 48 hours  Sodium 142 potassium 4.3 chloride 19 CO2 84 creatinine 2.94 glucose 75 calcium 8.7 hemoglobin 8.5  Urinalysis cloudy.  Patient is on antibiotic therapy at this time.  We will send urine culture.  CT scan did not reveal any evidence of solid mass.   .  She is progressively oliguric and decisions will need to be made regarding appropriateness of initiating hemodialysis.  Objective:  Vital signs in last 24 hours:  Temp:  [98.2 F (36.8 C)-98.7 F (37.1 C)] 98.5 F (36.9 C) (04/20 0625) Pulse Rate:  [85-94] 85 (04/20 0625) Resp:  [18] 18 (04/20 0625) BP: (125-163)/(61-84) 163/84 (04/20 0625) SpO2:  [97 %-100 %] 100 % (04/20 0625)  Weight change:  Filed Weights   02/26/21 0148 02/28/21 0500 03/02/21 1024  Weight: 61.2 kg 61.3 kg 99 kg    Intake/Output: I/O last 3 completed shifts: In: 71 [Blood:378] Out: 1000 [Urine:900; Drains:100]   Intake/Output this shift:  No intake/output data recorded.  General: Chronically ill-appearing, lying in bed, no distress HEENT: anicteric sclera, oropharynx clear without lesions CV: Normal rate, no peripheral edema Lungs: clear to auscultation bilaterally, normal work of breathing Abd: soft,  non-tender, non-distended Skin: no visible lesions or rashes Psych: Awake and alert with appropriate mood Musculoskeletal: Left leg wrapped in Ace bandage, otherwise no significant deformities Neuro: normal speech, oriented to person but not place time or situation  Basic Metabolic Panel: Recent Labs  Lab 03/01/21 0211 03/02/21 0151 03/04/21 0740 03/05/21 0323 03/05/21 1206 03/05/21 1831 03/06/21 0305 03/07/21 0355  NA 133*   < > 142 140 142 138 139 142  K 4.6   < > 4.6 4.7 4.6 4.4 4.5 4.3  CL 106   < > 115* 114* 113* 112* 112* 114*  CO2 23   < > 20* 21* 21* 21* 23 19*  GLUCOSE 104*   < > 124* 114* 96 107* 84 75  BUN 68*   < > 82* 85* 85* 85* 84* 84*  CREATININE 2.07*   < > 2.48* 2.87* 2.93* 2.99* 3.06* 2.94*  CALCIUM 8.1*   < > 8.2* 8.5* 8.9 8.3* 8.3* 8.7*  PHOS 2.9  --  3.5  --   --   --   --   --    < > = values in this interval not displayed.    Liver Function Tests: Recent Labs  Lab 03/01/21 0211 03/02/21 0151 02/23/2021 0103 03/04/21 0519 03/04/21 0740  AST  --  19 24 32  --   ALT  --  11 13 9   --   ALKPHOS  --  59 56 48  --   BILITOT  --  0.7 0.4 0.8  --   PROT  --  5.7* 5.8* 5.0*  --  ALBUMIN 1.6* 1.4* 1.4* 1.3* 1.4*   No results for input(s): LIPASE, AMYLASE in the last 168 hours. No results for input(s): AMMONIA in the last 168 hours.  CBC: Recent Labs  Lab 03/04/21 0733 03/05/21 0323 03/05/21 1126 03/06/21 0305 03/06/21 1010 03/06/21 1800 03/07/21 0355  WBC 21.4* 17.6* 15.6* 13.5*  --   --  13.6*  NEUTROABS 18.1* 14.5*  --  10.9*  --   --  11.2*  HGB 7.3* 5.6* 7.0* 6.9* 6.8* 8.4* 8.5*  HCT 22.4* 17.5* 21.4* 21.6* 21.1* 25.3* 26.5*  MCV 90.3 93.6 88.4 87.8  --   --  88.3  PLT 220 182 187 197  --   --  258    Cardiac Enzymes: Recent Labs  Lab 02/26/2021 0103 03/04/21 0519 03/06/21 0305  CKTOTAL 69 97 87    BNP: Invalid input(s): POCBNP  CBG: No results for input(s): GLUCAP in the last 168 hours.  Microbiology: Results for orders  placed or performed during the hospital encounter of 03/14/2021  Blood culture (routine single)     Status: None   Collection Time: 03/01/2021  8:57 PM   Specimen: Right Antecubital; Blood  Result Value Ref Range Status   Specimen Description RIGHT ANTECUBITAL  Final   Special Requests   Final    BOTTLES DRAWN AEROBIC AND ANAEROBIC Blood Culture adequate volume   Culture   Final    NO GROWTH 5 DAYS Performed at [facility], [address] [place], [place] [address]    Report Status 03/02/2021 FINAL  Final  Resp Panel by RT-PCR (Flu A&B, Covid) Nasopharyngeal Swab     Status: None   Collection Time: 02/20/2021  9:00 PM   Specimen: Nasopharyngeal Swab; Nasopharyngeal(NP) swabs in vial transport medium  Result Value Ref Range Status   SARS Coronavirus 2 by RT PCR NEGATIVE NEGATIVE Final    Comment: (NOTE) SARS-CoV-2 target nucleic acids are NOT DETECTED.  The SARS-CoV-2 RNA is generally detectable in upper respiratory specimens during the acute phase of infection. The lowest concentration of SARS-CoV-2 viral copies this assay can detect is 138 copies/mL. A negative result does not preclude SARS-Cov-2 infection and should not be used as the sole basis for treatment or other patient management decisions. A negative result may occur with  improper specimen collection/handling, submission of specimen other than nasopharyngeal swab, presence of viral mutation(s) within the areas targeted by this assay, and inadequate number of viral copies(<138 copies/mL). A negative result must be combined with clinical observations, patient history, and epidemiological information. The expected result is Negative.  Fact Sheet for Patients:  EntrepreneurPulse.com.au  Fact Sheet for Healthcare Providers:  IncredibleEmployment.be  This test is no t yet approved or cleared by the [place] FDA and  has been authorized for detection and/or diagnosis of SARS-CoV-2  by FDA under an Emergency Use Authorization (EUA). This EUA will remain  in effect (meaning this test can be used) for the duration of the COVID-19 declaration under Section 564(b)(1) of the Act, 21 U.S.C.section 360bbb-3(b)(1), unless the authorization is terminated  or revoked sooner.       Influenza A by PCR NEGATIVE NEGATIVE Final   Influenza B by PCR NEGATIVE NEGATIVE Final    Comment: (NOTE) The Xpert Xpress SARS-CoV-2/FLU/RSV plus assay is intended as an aid in the diagnosis of influenza from Nasopharyngeal swab specimens and should not be used as a sole basis for treatment. Nasal washings and aspirates are unacceptable for Xpert Xpress SARS-CoV-2/FLU/RSV testing.  Fact Sheet for  Patients: EntrepreneurPulse.com.au  Fact Sheet for Healthcare Providers: IncredibleEmployment.be  This test is not yet approved or cleared by the [place] FDA and has been authorized for detection and/or diagnosis of SARS-CoV-2 by FDA under an Emergency Use Authorization (EUA). This EUA will remain in effect (meaning this test can be used) for the duration of the COVID-19 declaration under Section 564(b)(1) of the Act, 21 U.S.C. section 360bbb-3(b)(1), unless the authorization is terminated or revoked.  Performed at [facility], [address], [place], [place] [address]   Urine culture     Status: Abnormal   Collection Time: 02/21/2021 11:57 PM   Specimen: In/Out Cath Urine  Result Value Ref Range Status   Specimen Description   Final    IN/OUT CATH URINE Performed at [facility], [address], [place], [place] [address]    Special Requests   Final    NONE Performed at [facility], [address] [place], [place] [address]    Culture 80,000 COLONIES/mL ENTEROCOCCUS AVIUM (A)  Final   Report Status 03/01/2021 FINAL  Final   Organism ID, Bacteria ENTEROCOCCUS AVIUM (A)  Final      Susceptibility   Enterococcus avium - MIC*    AMPICILLIN 16  RESISTANT Resistant     NITROFURANTOIN <=16 SENSITIVE Sensitive     VANCOMYCIN <=0.5 SENSITIVE Sensitive     * 80,000 COLONIES/mL ENTEROCOCCUS AVIUM  Body fluid culture w Gram Stain     Status: None   Collection Time: 03/01/21 11:02 AM   Specimen: Body Fluid  Result Value Ref Range Status   Specimen Description FLUID  Final   Special Requests SYNOVIAL LEFT KNEE  Final   Gram Stain   Final    ABUNDANT WBC PRESENT, PREDOMINANTLY MONONUCLEAR ABUNDANT GRAM POSITIVE COCCI IN PAIRS IN CHAINS    Culture   Final    NO GROWTH 3 DAYS Performed at [facility], [place] [address], [place], [place] [address]    Report Status 03/05/2021 FINAL  Final    Coagulation Studies: No results for input(s): LABPROT, INR in the last 72 hours.  Urinalysis: No results for input(s): COLORURINE, LABSPEC, PHURINE, GLUCOSEU, HGBUR, BILIRUBINUR, KETONESUR, PROTEINUR, UROBILINOGEN, NITRITE, LEUKOCYTESUR in the last 72 hours.  Invalid input(s): APPERANCEUR    Imaging: CT ABDOMEN PELVIS WO CONTRAST  Result Date: 03/06/2021 CLINICAL DATA:  87 year old female with history of renal mass. Renal insufficiency. CT the abdomen EXAM: CT ABDOMEN AND PELVIS WITHOUT CONTRAST TECHNIQUE: Multidetector CT imaging of the abdomen and pelvis was performed following the standard protocol without IV contrast. COMPARISON:  CT the abdomen and pelvis 04/16/2009. FINDINGS: Comment: Today's study is limited for detection and characterization of visceral and/or vascular lesions by lack of IV contrast. Lower chest: Small bilateral pleural effusions with what appears to be areas of dependent subsegmental atelectasis in the lower lobes of the lungs bilaterally. Atherosclerotic calcifications in the descending thoracic aorta as well as the left anterior descending, left circumflex and right coronary arteries. Hepatobiliary: No suspicious cystic or solid hepatic lesions are confidently identified on today's noncontrast CT examination.  Unenhanced appearance of the gallbladder is unremarkable. Pancreas: No definite pancreatic mass or peripancreatic fluid collections or inflammatory changes are noted on today's noncontrast CT examination. Spleen: Unremarkable. Adrenals/Urinary Tract: No definite renal mass confidently identified on today's noncontrast CT examination. No hydroureteronephrosis. Urinary bladder is decompressed around an indwelling Foley balloon catheter. Bilateral adrenal glands are normal in appearance. Stomach/Bowel: Unenhanced appearance of the stomach is normal. No pathologic dilatation  of small bowel or colon. Status post appendectomy. Vascular/Lymphatic: Aortic atherosclerosis. No lymphadenopathy noted in the abdomen or pelvis. Reproductive: Status post hysterectomy.  Ovaries are a trophic. Other: No significant volume of ascites.  No pneumoperitoneum. Musculoskeletal: Diffuse body wall edema. There are no aggressive appearing lytic or blastic lesions noted in the visualized portions of the skeleton. IMPRESSION: 1. Limited study secondary to lack of IV contrast. No renal mass confidently identified on today's noncontrast examination. 2. Diffuse body wall edema and small bilateral pleural effusions; imaging findings that may suggest a state of anasarca. 3. Aortic atherosclerosis, in addition to at least 3 vessel coronary artery disease. 4. Additional incidental findings, as above. Electronically Signed   By: Vinnie Langton M.D.   On: 03/06/2021 08:07     Medications:   . cefTRIAXone (ROCEPHIN)  IV Stopped (03/06/21 2255)  . DAPTOmycin (CUBICIN)  IV 500 mg (03/05/21 2117)   . bumetanide (BUMEX) IV  2 mg Intravenous Q12H  . Chlorhexidine Gluconate Cloth  6 each Topical Daily  . cilostazol  100 mg Oral BID  . docusate sodium  100 mg Oral BID  . feeding supplement  237 mL Oral TID BM  . heparin injection (subcutaneous)  5,000 Units Subcutaneous Q12H  . levothyroxine  100 mcg Oral Q0600  . mouth rinse  15 mL Mouth  Rinse BID  . megestrol  400 mg Oral Daily  . metoprolol tartrate  12.5 mg Oral BID  . mirtazapine  7.5 mg Oral QHS  . multivitamin with minerals  1 tablet Oral Daily  . nutrition supplement (JUVEN)  1 packet Oral BID BM  . pantoprazole (PROTONIX) IV  40 mg Intravenous Q12H  . senna-docusate  1 tablet Oral QHS  . sodium bicarbonate  1,300 mg Oral BID  . sodium chloride flush  10-40 mL Intracatheter Q12H  . vitamin B-12  1,000 mcg Oral Daily   acetaminophen, alum & mag hydroxide-simeth, haloperidol lactate, menthol-cetylpyridinium **OR** phenol, metoCLOPramide **OR** metoCLOPramide (REGLAN) injection, metoprolol tartrate, ondansetron **OR** ondansetron (ZOFRAN) IV, polyethylene glycol, sodium chloride flush  Assessment/ Plan:  Oliguric AKI: BL 0.8-1 but higher outpatient recently and unclear why. AKI here initially improved now worse over past 24 hours w/ low UOP. 5L positive but possibly outs not well documented. She does not appear overloaded and may be slightly dry. AKI likely related to acute illness with possible tubular injury 2/2 sepsis w/ borderline hypotension and ongoing tachycardia. Vanc could be contributing -Underwent administration of 5% albumin 03/05/2021.  Hypoalbuminemia noted on laboratory evaluation -Continue hydration PRN -Urinalysis 100 mg/dL protein.  No protein noted 03/07/2021.  WBCs greater than 50.  RBCs 11-20.  This shows some change from urinalysis of 02/23/2021.  Random vancomycin level of 35 -Continue to monitor daily Cr, Dose meds for GFR -Monitor Daily I/Os, Daily weight  -Maintain MAP>65 for optimal renal perfusion.  -Avoid nephrotoxic medications including NSAIDs and Vanc/Zosyn combo -CT scan did not identify any renal mass  2.Prosthetic Joint Infection: s/p procedure with Ortho now on antibiotics. Cx w/ GPCs. F/u further results. Mgmt per primary, ortho, and ID  3.Sepsis: related to joint infection.  Antibiotics as per primary service  4.Anemia: likely  multifactorial w/ acute illness contributing. hgb of 6.9. No iron given infection. Transfusion as needed  5. NAGMA: likely 2/2 AKI. Bicarb 20. CTM we will add sodium bicarbonate 625 mg twice daily     LOS: [place] @TODAY @9 :18 AM

## 2021-03-07 NOTE — Progress Notes (Signed)
Physical Therapy Treatment Patient Details Name: ASLAN MONTAGNA MRN: [id number] DOB: 13-Feb-1929 Today's Date: 03/07/2021    History of Present Illness Copelyn is a 87 y/o female who presented to ED at [facility] on 4/10 with complaints of weakness, decreased appeptite, and confusion. Pt found to have sepsis due to L leg cellulitis and septic knee. Pt transfered to [facility] on 4/14. Was admitted on 01/14/21 for L distal femur fx repair and underwent L TKA. I&D with TKA resection on 03/02/2021. 4/19 Palliative consult with pt/family desiring full, aggressive care. 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    PT Comments    Patient seen for LE exercises to assess ability to follow commands and tolerance for activity. Patient followed all simple commands related to exercises. She was able to communicate when she needed to "spit" and able to seal lips around [place] for suctioning out secretions (~6 times during session). Able to communicate that she wanted her blankets removed "too much" and only a sheet covering her. Next visit will attempt mobility as appropriate.    Follow Up Recommendations  SNF;Supervision/Assistance - 24 hour     Equipment Recommendations  Other (comment) (hoyer lift and pad)    Recommendations for Other Services       Precautions / Restrictions Precautions Precautions: Fall Precaution Comments: JP drain, L hemovac, L wound vac, watch BP (tends to run low), dementia, difficult to understand speech Required Braces or Orthoses: Knee Immobilizer - Left Knee Immobilizer - Left: On at all times Restrictions LLE Weight Bearing: Weight bearing as tolerated Other Position/Activity Restrictions: Knee immobilizer on at all times    Mobility  Bed Mobility                    Transfers                    Ambulation/Gait                 Stairs             Wheelchair Mobility    Modified  Rankin (Stroke Patients Only)       Balance                                            Cognition Arousal/Alertness: Awake/alert Behavior During Therapy: Flat affect Overall Cognitive Status: Difficult to assess Area of Impairment: Orientation;Following commands;Awareness                 Orientation Level: Disoriented to;Place;Time;Situation Current Attention Level: Sustained   Following Commands: Follows one step commands consistently     Problem Solving: Requires verbal cues        Exercises General Exercises - Lower Extremity Ankle Circles/Pumps: AROM;Both;5 reps Quad Sets: AROM;Both;5 reps Short Arc Quad: AROM;Right;10 reps Heel Slides: AAROM;Right;5 reps (resisted extension) Hip ABduction/ADduction: AAROM;Both;5 reps    General Comments        Pertinent Vitals/Pain Pain Assessment: Faces Faces Pain Scale: No hurt    Home Living                      Prior Function            PT Goals (current goals can now be found in the care plan section) Acute Rehab PT Goals Patient Stated  Goal: none stated Progress towards PT goals: Progressing toward goals    Frequency    Min 3X/week      PT Plan Current plan remains appropriate    Co-evaluation              AM-PAC PT "6 Clicks" Mobility   Outcome Measure  Help needed turning from your back to your side while in a flat bed without using bedrails?: Total Help needed moving from lying on your back to sitting on the side of a flat bed without using bedrails?: Total Help needed moving to and from a bed to a chair (including a wheelchair)?: Total Help needed standing up from a chair using your arms (e.g., wheelchair or bedside chair)?: Total Help needed to walk in hospital room?: Total Help needed climbing 3-5 steps with a railing? : Total 6 Click Score: 6    End of Session Equipment Utilized During Treatment: Left knee immobilizer Activity Tolerance: Patient  tolerated treatment well Patient left: in bed;with call bell/phone within reach Nurse Communication: Other (comment) (pt reporting she wanted blankets off and only sheet) PT Visit Diagnosis: Muscle weakness (generalized) (M62.81);Unsteadiness on feet (R26.81)     Time: 4373-5789 PT Time Calculation (min) (ACUTE ONLY): 22 min  Charges:  $Therapeutic Exercise: 8-22 mins                      Arby Barrette, PT Pager [phone]    Rexanne Mano 03/07/2021, 11:51 AM

## 2021-03-08 DIAGNOSIS — R1310 Dysphagia, unspecified: Secondary | ICD-10-CM | POA: Diagnosis not present

## 2021-03-08 DIAGNOSIS — G9341 Metabolic encephalopathy: Secondary | ICD-10-CM | POA: Diagnosis not present

## 2021-03-08 DIAGNOSIS — N179 Acute kidney failure, unspecified: Secondary | ICD-10-CM | POA: Diagnosis not present

## 2021-03-08 DIAGNOSIS — R410 Disorientation, unspecified: Secondary | ICD-10-CM | POA: Diagnosis not present

## 2021-03-08 DIAGNOSIS — A419 Sepsis, unspecified organism: Secondary | ICD-10-CM | POA: Diagnosis not present

## 2021-03-08 DIAGNOSIS — T8459XA Infection and inflammatory reaction due to other internal joint prosthesis, initial encounter: Secondary | ICD-10-CM | POA: Diagnosis not present

## 2021-03-08 DIAGNOSIS — R627 Adult failure to thrive: Secondary | ICD-10-CM | POA: Diagnosis not present

## 2021-03-08 DIAGNOSIS — F0390 Unspecified dementia without behavioral disturbance: Secondary | ICD-10-CM | POA: Diagnosis not present

## 2021-03-08 LAB — CBC WITH DIFFERENTIAL/PLATELET
Abs Immature Granulocytes: 0.2 10*3/uL — ABNORMAL HIGH (ref 0.00–0.07)
Basophils Absolute: 0 10*3/uL (ref 0.0–0.1)
Basophils Relative: 0 %
Eosinophils Absolute: 0.2 10*3/uL (ref 0.0–0.5)
Eosinophils Relative: 1 %
HCT: 27.2 % — ABNORMAL LOW (ref 36.0–46.0)
Hemoglobin: 8.7 g/dL — ABNORMAL LOW (ref 12.0–15.0)
Immature Granulocytes: 2 %
Lymphocytes Relative: 7 %
Lymphs Abs: 0.8 10*3/uL (ref 0.7–4.0)
MCH: 28.2 pg (ref 26.0–34.0)
MCHC: 32 g/dL (ref 30.0–36.0)
MCV: 88.3 fL (ref 80.0–100.0)
Monocytes Absolute: 0.5 10*3/uL (ref 0.1–1.0)
Monocytes Relative: 4 %
Neutro Abs: 9.6 10*3/uL — ABNORMAL HIGH (ref 1.7–7.7)
Neutrophils Relative %: 86 %
Platelets: 309 10*3/uL (ref 150–400)
RBC: 3.08 MIL/uL — ABNORMAL LOW (ref 3.87–5.11)
RDW: 17.9 % — ABNORMAL HIGH (ref 11.5–15.5)
WBC: 11.2 10*3/uL — ABNORMAL HIGH (ref 4.0–10.5)
nRBC: 0 % (ref 0.0–0.2)

## 2021-03-08 LAB — BASIC METABOLIC PANEL
Anion gap: 8 (ref 5–15)
BUN: 75 mg/dL — ABNORMAL HIGH (ref 8–23)
CO2: 22 mmol/L (ref 22–32)
Calcium: 8.9 mg/dL (ref 8.9–10.3)
Chloride: 113 mmol/L — ABNORMAL HIGH (ref 98–111)
Creatinine, Ser: 2.89 mg/dL — ABNORMAL HIGH (ref 0.44–1.00)
GFR, Estimated: 15 mL/min — ABNORMAL LOW (ref >60)
Glucose, Bld: 73 mg/dL (ref 70–99)
Potassium: 3.6 mmol/L (ref 3.5–5.1)
Sodium: 143 mmol/L (ref 135–145)

## 2021-03-08 MED ORDER — PANTOPRAZOLE SODIUM 40 MG IV SOLR
40.0000 mg | Freq: Two times a day (BID) | INTRAVENOUS | Status: DC
  Administered 2021-03-09 – 2021-03-12 (×7): 40 mg via INTRAVENOUS
  Filled 2021-03-08 (×7): qty 40

## 2021-03-08 MED ORDER — OSMOLITE 1.5 CAL PO LIQD
1000.0000 mL | ORAL | Status: DC
  Administered 2021-03-09 – 2021-03-13 (×3): 1000 mL
  Filled 2021-03-08 (×8): qty 1000

## 2021-03-08 MED ORDER — BUMETANIDE 0.25 MG/ML IJ SOLN
2.0000 mg | Freq: Two times a day (BID) | INTRAMUSCULAR | Status: DC
  Administered 2021-03-08: 2 mg via INTRAVENOUS
  Filled 2021-03-08 (×2): qty 8

## 2021-03-08 MED ORDER — PROSOURCE TF PO LIQD
45.0000 mL | Freq: Three times a day (TID) | ORAL | Status: DC
  Administered 2021-03-09 – 2021-03-12 (×9): 45 mL
  Filled 2021-03-08 (×8): qty 45

## 2021-03-08 MED ORDER — JUVEN PO PACK
1.0000 | PACK | Freq: Two times a day (BID) | ORAL | Status: DC
  Administered 2021-03-09 – 2021-03-14 (×9): 1
  Filled 2021-03-08 (×8): qty 1

## 2021-03-08 MED ORDER — BISACODYL 10 MG RE SUPP
10.0000 mg | Freq: Once | RECTAL | Status: AC
  Administered 2021-03-08: 10 mg via RECTAL
  Filled 2021-03-08: qty 1

## 2021-03-08 NOTE — Progress Notes (Signed)
  Speech Language Pathology Treatment: Dysphagia  Patient Details Name: [name] MRN: [id number] DOB: [birth year] Today's Date: 03/08/2021 Time: 7062-3762 SLP Time Calculation (min) (ACUTE ONLY): 22 min  Assessment / Plan / Recommendation Clinical Impression  Follow-up for dysphagia. Pt's daughters were both at bedside and they were feeding their mother lunch.  Pt was responding well to their efforts, with improved oral attention and bolus manipulation today, palpable swallow, no coughing with thin liquids or purees.  She would decline further POs, then after 30" request more.  She ultimately began expectorating food from her mouth and family ceased efforts to feed.   [name] and [name] asked my opinion about tube feedings.  I was honest about the benefits vs adverse consequences, the latter as related to potential issues with diarrhea, poorly healing wounds, the need for restraints, as well as ongoing issues with aspiration (from saliva and often reflux of TF). We talked about their usefulness with people who have diagnoses from which they will recover; [name] said "but she won't recover from this" referring to her dementia. I agreed.  [name] conveyed how much they love their mother and how she was always the one that pulled their family together during hard times.  I offered support and encouragement during this difficult time.   Spoke with Burtis Junes re: our conversation.  No further SLP f/u is warranted. Education has been completed.   HPI HPI: Pt is an 87 y.o. female with medical history significant for hypothyroidism, dementia, GERD, hypertension, PVD and arthritis who presented to the [facility] ED via EMS due to 4-day onset of confusion and weakness. Per report, patient was unable to swallow within past 3 to 4 days prior to admission, she presented with gagging and choking sensation on eating.  Pt was admitted to the hospital from 2/21-3/2 due to left distal femur fracture s/p left distal  femoral replacement. Per EMR, pt pt's cognition waxes and wanes from day to day at baseline, but she is typically able to speak and indicate her wants and needs. She was found to have severe sepsis due to lower extremity cellulitis and there was concern for left septic knee arthritis; ortho was consulted and pt was transferred to [facility] for evaluation. Pt diagnosed with severe sepsis, acute renal failure, Enterococcus UTI, hyponatremia, hypokalemia, and acute metabolic encephalopathy. Pt s/p left knee aspiration and injection 4/14 and irrigation and debridement on 4/16.      SLP Plan  Discharge SLP treatment due to (comment) (education complete)       Recommendations  Diet recommendations: Dysphagia 1 (puree);Thin liquid Liquids provided via: Cup;Straw Medication Administration: Crushed with puree Supervision: Trained caregiver to feed patient Compensations: Slow rate;Small sips/bites                Oral Care Recommendations: Oral care BID Follow up Recommendations: 24 hour supervision/assistance SLP Visit Diagnosis: Dysphagia, unspecified (R13.10) Plan: Discharge SLP treatment due to (comment) (education complete)       GO              Yehuda Printup L. Tivis Ringer, [place] CCC/SLP Acute Rehabilitation Services Office number [phone] Pager [phone]   Assunta Curtis 03/08/2021, 2:32 PM

## 2021-03-08 NOTE — Progress Notes (Signed)
    Progress Note from the Palliative Medicine Team at [facility]   Patient Name: [name]       Date: 03/08/2021 DOB: 10/01/1929  Age: 87 y.o. MRN#: [id number] Attending Physician: Florencia Reasons, MD Primary Care Physician: Rosita Fire, MD Admit Date: 02/27/2021   Medical records reviewed   Per intake H&P --> 87 year old female with a PMH of Chronic Dementia, Peripheral Vascular Disease, and multiple medical problems presented to [facility] ED on 4/11 with confusion, weakness, mild AKI (Creatinine 2.53 with baseline ~ 1 mg/dL) and acute swelling and redness of the left knee. Left Knee Arthrocentesis performed by Ortho followed by a left Total Knee Arthroplasty with Radical Synovectomy on 4/16. Patient required 1 unit of PRBC's today for anemia.   Palliative care was asked to get involved to further address goals of care in the setting of ongoing infection(s).   Family face treatment option decisions, advanced directive decisions and anticipatory care needs.  This NP visited patient at the bedside as a follow up for palliative medicine needs and emotional support.    I met again today with patient's daughters, Corliss Skains and Leodis Liverpool  to continue discussion regarding diagnosis, treatment option decisions,  prognosis, GOC, EOL wishes, disposition and options.    (Patient has no documented H POA, both daughters act together to make decisions in the patient's best interest)  Continued conversation regarding the seriousness of Ms. Geeslin's current medical situation; sepsis,  worsening renal function, stage IV sacral wounds, dysphagia and poor oral intake, acute anemia requiring transfusion, dementia based dysphagia  and  overall failure to thrive.  Education offered on the natural trajectory of dementia specific to weakness, dysphagia and associated aspiration.  Detailed discussion had today regarding the risks and benefits of artificial feeding, specific to Ms. Mcglaughlin.     Family wish to proceed with "trial" with a core-trac hopeing that this will increase her chances of improvement.   Education and conversation specific the concept of adult failure to thrive.  We discussed limitations of medical interventions to prolong quality of life when the body does fail to thrive.     Both  daughters verbalized their openness to all offered and available medical interventions to prolong life. I discussed with daughters the importance of considering best case scenario versus worst-case scenario and anticipatory care needs and decisions.   Discussed with family  the importance of continued conversation with each other and the  medical providers regarding overall plan of care and treatment options,  ensuring decisions are within the context of the patients values and GOCs.  Questions and concerns addressed   Discussed with Dr Erlinda Hong and Juan Quam SLP  PMT will continue to support holistically  This nurse practitioner informed  the family  that I will be out of the hospital until Monday morning.  If the patient is still hospitalized I will follow-up at that time.  Call palliative medicine team phone # [phone] with questions or concerns in the interim   Total time spent on the unit was 45 minutes  Greater than 50% of the time was spent in counseling and coordination of care  Wadie Lessen NP  Palliative Medicine Team Team Phone # [phone] Pager [phone]

## 2021-03-08 NOTE — Progress Notes (Signed)
[facility] ROUNDING NOTE   Subjective:   Brief history: This is an 87 year old lady with a history of hyperlipidemia peripheral vascular disease atrial fibrillation hypertension hypothyroidism who was admitted with a prosthetic joint infection.  She was treated with vancomycin.  Baseline creatinine is 0.8 to 1 mg/dL.  She has developed acute kidney injury with a creatinine 2.87 mg/dL 03/05/2021.  She has had an improvement in urine output 2.2 L 03/07/2021    Blood pressure 124/61 pulse 77 temperature 97.6 O2 sats 90% 2 L nasal cannula.  Current medications: Cilostazol 100 mg twice daily, levothyroxine 100 mcg daily, metoprolol 25 mg   twice daily, Protonix 40 mg every 12 hours, sodium bicarbonate 1.3 g twice daily.  Megace 400 mg daily.  Remeron 15 mg daily IV Rocephin 2 g every 24 hours, daptomycin 500 mg every 48 hours  Bumex 2 mg IV twice daily  Sodium 143 potassium 3.6 chloride 113 CO2 22 BUN 75 creatinine 2.89 glucose 73 hemoglobin 8.7    Urinalysis cloudy.  Patient is on antibiotic therapy at this time.  We will send urine culture.  CT scan did not reveal any evidence of solid mass.   .  She is progressively oliguric and decisions will need to be made regarding appropriateness of initiating hemodialysis.  Objective:  Vital signs in last 24 hours:  Temp:  [97.6 F (36.4 C)] 97.6 F (36.4 C) (04/21 0620) Pulse Rate:  [72-84] 72 (04/21 0753) Resp:  [16-20] 16 (04/21 0620) BP: (108-166)/(61-86) 124/61 (04/21 0753) SpO2:  [99 %-100 %] 100 % (04/21 0620)  Weight change:  Filed Weights   02/26/21 0148 02/28/21 0500 03/02/21 1024  Weight: 61.2 kg 61.3 kg 99 kg    Intake/Output: I/O last 3 completed shifts: In: 668.5 [P.O.:250; IV Piggyback:418.5] Out: 0277 [Urine:3250; Drains:70]   Intake/Output this shift:  No intake/output data recorded.  General: Chronically ill-appearing, lying in bed, no distress HEENT: anicteric sclera, oropharynx clear without lesions CV:  Normal rate, no peripheral edema Lungs: clear to auscultation bilaterally, normal work of breathing Abd: soft, non-tender, non-distended Skin: no visible lesions or rashes Psych: Awake and alert with appropriate mood Musculoskeletal: Left leg wrapped in Ace bandage, otherwise no significant deformities Neuro: normal speech, oriented to person but not place time or situation  Basic Metabolic Panel: Recent Labs  Lab 03/04/21 0740 03/05/21 0323 03/05/21 1831 03/06/21 0305 03/07/21 0355 03/07/21 1830 03/08/21 0500  NA 142   < > 138 139 142 141 143  K 4.6   < > 4.4 4.5 4.3 4.0 3.6  CL 115*   < > 112* 112* 114* 112* 113*  CO2 20*   < > 21* 23 19* 21* 22  GLUCOSE 124*   < > 107* 84 75 96 73  BUN 82*   < > 85* 84* 84* 80* 75*  CREATININE 2.48*   < > 2.99* 3.06* 2.94* 2.91* 2.89*  CALCIUM 8.2*   < > 8.3* 8.3* 8.7* 8.8* 8.9  PHOS 3.5  --   --   --   --   --   --    < > = values in this interval not displayed.    Liver Function Tests: Recent Labs  Lab 03/02/21 0151 03/11/2021 0103 03/04/21 0519 03/04/21 0740  AST 19 24 32  --   ALT 11 13 9   --   ALKPHOS 59 56 48  --   BILITOT 0.7 0.4 0.8  --   PROT 5.7* 5.8* 5.0*  --  ALBUMIN 1.4* 1.4* 1.3* 1.4*   No results for input(s): LIPASE, AMYLASE in the last 168 hours. No results for input(s): AMMONIA in the last 168 hours.  CBC: Recent Labs  Lab 03/04/21 0733 03/05/21 0323 03/05/21 1126 03/06/21 0305 03/06/21 1010 03/06/21 1800 03/07/21 0355 03/07/21 1830 03/08/21 0500  WBC 21.4* 17.6* 15.6* 13.5*  --   --  13.6*  --  11.2*  NEUTROABS 18.1* 14.5*  --  10.9*  --   --  11.2*  --  9.6*  HGB 7.3* 5.6* 7.0* 6.9* 6.8* 8.4* 8.5* 8.9* 8.7*  HCT 22.4* 17.5* 21.4* 21.6* 21.1* 25.3* 26.5* 27.5* 27.2*  MCV 90.3 93.6 88.4 87.8  --   --  88.3  --  88.3  PLT 220 182 187 197  --   --  258  --  309    Cardiac Enzymes: Recent Labs  Lab 03/10/2021 0103 03/04/21 0519 03/06/21 0305  CKTOTAL 69 97 87    BNP: Invalid input(s):  POCBNP  CBG: No results for input(s): GLUCAP in the last 168 hours.  Microbiology: Results for orders placed or performed during the hospital encounter of 02/28/2021  Blood culture (routine single)     Status: None   Collection Time: 02/24/2021  8:57 PM   Specimen: Right Antecubital; Blood  Result Value Ref Range Status   Specimen Description RIGHT ANTECUBITAL  Final   Special Requests   Final    BOTTLES DRAWN AEROBIC AND ANAEROBIC Blood Culture adequate volume   Culture   Final    NO GROWTH 5 DAYS Performed at [facility], [address] [place], [place] [address]    Report Status 03/02/2021 FINAL  Final  Resp Panel by RT-PCR (Flu A&B, Covid) Nasopharyngeal Swab     Status: None   Collection Time: 03/14/2021  9:00 PM   Specimen: Nasopharyngeal Swab; Nasopharyngeal(NP) swabs in vial transport medium  Result Value Ref Range Status   SARS Coronavirus 2 by RT PCR NEGATIVE NEGATIVE Final    Comment: (NOTE) SARS-CoV-2 target nucleic acids are NOT DETECTED.  The SARS-CoV-2 RNA is generally detectable in upper respiratory specimens during the acute phase of infection. The lowest concentration of SARS-CoV-2 viral copies this assay can detect is 138 copies/mL. A negative result does not preclude SARS-Cov-2 infection and should not be used as the sole basis for treatment or other patient management decisions. A negative result may occur with  improper specimen collection/handling, submission of specimen other than nasopharyngeal swab, presence of viral mutation(s) within the areas targeted by this assay, and inadequate number of viral copies(<138 copies/mL). A negative result must be combined with clinical observations, patient history, and epidemiological information. The expected result is Negative.  Fact Sheet for Patients:  EntrepreneurPulse.com.au  Fact Sheet for Healthcare Providers:  IncredibleEmployment.be  This test is no t yet approved or  cleared by the [place] FDA and  has been authorized for detection and/or diagnosis of SARS-CoV-2 by FDA under an Emergency Use Authorization (EUA). This EUA will remain  in effect (meaning this test can be used) for the duration of the COVID-19 declaration under Section 564(b)(1) of the Act, 21 U.S.C.section 360bbb-3(b)(1), unless the authorization is terminated  or revoked sooner.       Influenza A by PCR NEGATIVE NEGATIVE Final   Influenza B by PCR NEGATIVE NEGATIVE Final    Comment: (NOTE) The Xpert Xpress SARS-CoV-2/FLU/RSV plus assay is intended as an aid in the diagnosis of influenza from Nasopharyngeal swab specimens and should not  be used as a sole basis for treatment. Nasal washings and aspirates are unacceptable for Xpert Xpress SARS-CoV-2/FLU/RSV testing.  Fact Sheet for Patients: EntrepreneurPulse.com.au  Fact Sheet for Healthcare Providers: IncredibleEmployment.be  This test is not yet approved or cleared by the [place] FDA and has been authorized for detection and/or diagnosis of SARS-CoV-2 by FDA under an Emergency Use Authorization (EUA). This EUA will remain in effect (meaning this test can be used) for the duration of the COVID-19 declaration under Section 564(b)(1) of the Act, 21 U.S.C. section 360bbb-3(b)(1), unless the authorization is terminated or revoked.  Performed at [facility], [address], [place], [place] [address]   Urine culture     Status: Abnormal   Collection Time: 03/15/2021 11:57 PM   Specimen: In/Out Cath Urine  Result Value Ref Range Status   Specimen Description   Final    IN/OUT CATH URINE Performed at [facility], [address], [place], [place] [address]    Special Requests   Final    NONE Performed at [facility], [address], [place], [place] [address]    Culture 80,000 COLONIES/mL ENTEROCOCCUS AVIUM (A)  Final   Report Status 03/01/2021 FINAL  Final   Organism ID,  Bacteria ENTEROCOCCUS AVIUM (A)  Final      Susceptibility   Enterococcus avium - MIC*    AMPICILLIN 16 RESISTANT Resistant     NITROFURANTOIN <=16 SENSITIVE Sensitive     VANCOMYCIN <=0.5 SENSITIVE Sensitive     * 80,000 COLONIES/mL ENTEROCOCCUS AVIUM  Body fluid culture w Gram Stain     Status: None   Collection Time: 03/01/21 11:02 AM   Specimen: Body Fluid  Result Value Ref Range Status   Specimen Description FLUID  Final   Special Requests SYNOVIAL LEFT KNEE  Final   Gram Stain   Final    ABUNDANT WBC PRESENT, PREDOMINANTLY MONONUCLEAR ABUNDANT GRAM POSITIVE COCCI IN PAIRS IN CHAINS    Culture   Final    NO GROWTH 3 DAYS Performed at [facility], [place] [address] [place], [place] [address]    Report Status 03/05/2021 FINAL  Final    Coagulation Studies: No results for input(s): LABPROT, INR in the last 72 hours.  Urinalysis: No results for input(s): COLORURINE, LABSPEC, PHURINE, GLUCOSEU, HGBUR, BILIRUBINUR, KETONESUR, PROTEINUR, UROBILINOGEN, NITRITE, LEUKOCYTESUR in the last 72 hours.  Invalid input(s): APPERANCEUR    Imaging: MR ANKLE RIGHT WO CONTRAST  Result Date: 03/07/2021 CLINICAL DATA:  Right lateral ankle wound. Evaluate for osteomyelitis. EXAM: MRI OF THE RIGHT ANKLE WITHOUT CONTRAST TECHNIQUE: Multiplanar, multisequence MR imaging of the ankle was performed. No intravenous contrast was administered. COMPARISON:  Right foot x-rays dated January 10, 2021. Right ankle x-rays dated January 08, 2021. FINDINGS: TENDONS Peroneal: Peroneal longus tendon intact. Peroneal brevis intact. Posteromedial: Posterior tibial tendon intact. Flexor digitorum longus tendon intact. Flexor hallucis longus tendon intact. Anterior: Tibialis anterior tendon intact. Extensor hallucis longus tendon intact Extensor digitorum longus tendon intact. Achilles:  Intact. Plantar Fascia: Intact. Mild thickening of the proximal central band. LIGAMENTS Lateral: Anterior talofibular ligament  intact. Calcaneofibular ligament intact. Posterior talofibular ligament intact. Anterior and posterior tibiofibular ligaments intact. Medial: Deltoid ligament intact. Spring ligament intact. CARTILAGE Ankle Joint: No significant joint effusion. Normal ankle mortise. No chondral defect. Subtalar Joints/Sinus Tarsi: Normal subtalar joints. No subtalar joint effusion. Normal sinus tarsi. Bones: Mild marrow edema within the lateral malleolus. T1 marrow signal is mostly preserved. No acute fracture or dislocation. No suspicious bone  lesion. Mild navicular-medial cuneiform osteoarthritis. Soft Tissue: Near circumferential lower leg and hindfoot soft tissue swelling extending into the dorsolateral forefoot, sparing the medial aspect. Soft tissue ulceration over the lateral malleolus. No fluid collection. IMPRESSION: 1. Soft tissue ulceration over the lateral malleolus with underlying mild marrow edema, suspicious for early osteomyelitis. No abscess. Electronically Signed   By: Titus Dubin M.D.   On: 03/07/2021 12:13     Medications:   . cefTRIAXone (ROCEPHIN)  IV Stopped (03/07/21 2304)  . DAPTOmycin (CUBICIN)  IV Stopped (03/07/21 2125)   . bumetanide (BUMEX) IV  2 mg Intravenous BID  . Chlorhexidine Gluconate Cloth  6 each Topical Daily  . cilostazol  100 mg Oral BID  . docusate sodium  100 mg Oral BID  . feeding supplement  237 mL Oral TID BM  . heparin injection (subcutaneous)  5,000 Units Subcutaneous Q12H  . levothyroxine  100 mcg Oral Q0600  . mouth rinse  15 mL Mouth Rinse BID  . megestrol  400 mg Oral Daily  . metoprolol tartrate  25 mg Oral BID  . mirtazapine  7.5 mg Oral QHS  . multivitamin with minerals  1 tablet Oral Daily  . nutrition supplement (JUVEN)  1 packet Oral BID BM  . pantoprazole (PROTONIX) IV  40 mg Intravenous Q12H  . polyethylene glycol  17 g Oral Daily  . senna-docusate  1 tablet Oral QHS  . sodium bicarbonate  1,300 mg Oral TID  . sodium chloride flush  10-40 mL  Intracatheter Q12H  . vitamin B-12  1,000 mcg Oral Daily   acetaminophen, alum & mag hydroxide-simeth, haloperidol lactate, menthol-cetylpyridinium **OR** phenol, metoCLOPramide **OR** metoCLOPramide (REGLAN) injection, metoprolol tartrate, ondansetron **OR** ondansetron (ZOFRAN) IV, sodium chloride flush  Assessment/ Plan:  Oliguric AKI: BL 0.8-1 but higher outpatient recently and unclear why. AKI here initially improved now worse over past 24 hours w/ low UOP. 5L positive but possibly outs not well documented. She does not appear overloaded and may be slightly dry. AKI likely related to acute illness with possible tubular injury 2/2 sepsis w/ borderline hypotension and ongoing tachycardia. Vanc could be contributing -Underwent administration of 5% albumin 03/05/2021.  Hypoalbuminemia noted on laboratory evaluation -Continue hydration PRN -Urinalysis 100 mg/dL protein.  No protein noted 03/11/2021.  WBCs greater than 50.  RBCs 11-20.  This shows some change from urinalysis of 02/18/2021.  Random vancomycin level of 35 -Continue to monitor daily Cr, Dose meds for GFR -Monitor Daily I/Os, Daily weight  -Maintain MAP>65 for optimal renal perfusion.  -Avoid nephrotoxic medications including NSAIDs and Vanc/Zosyn combo -CT scan did not identify any renal mass  2.Prosthetic Joint Infection: s/p procedure with Ortho now on antibiotics. Cx w/ GPCs. F/u further results. Mgmt per primary, ortho, and ID  3.Sepsis: related to joint infection.  Antibiotics as per primary service  4.Anemia: likely multifactorial w/ acute illness contributing. hgb of 6.9. No iron given infection. Transfusion as needed  5. NAGMA: likely 2/2 AKI. Bicarb 20. CTM we will add sodium bicarbonate 625 mg twice daily  6.  Volume/hypertension it appears that she has had a brisk diuretic response to IV Bumex.  We will continue to follow.   Would suggest maybe holding Bumex at this point to see if the diuresis continues as a post ATN  diuresis.     LOS: [place] @TODAY @8 :32 AM

## 2021-03-08 NOTE — Progress Notes (Addendum)
Nutrition Follow-up  DOCUMENTATION CODES:  Obesity unspecified  INTERVENTION:  Monitor magnesium, potassium, and phosphorus daily for at least 3 days, MD to replete as needed, as pt is at risk for refeeding syndrome  Once Cortrak is placed and ready for use: -Initiate Osmolite 1.5 @ 32m/hr, advance by 13mhr Q12H until goal rate of 5521mr (1320m49ms reached -45ml73msource TF TID -transition to Juven BID to per tube  At goal, TF will provide 2100 kcal, 106g protein, 1181ml 75m water  Note, given slow titration, pt will not be at goal TF rate until 0600 on 4/24, so calorie count will still be good indicator of pt's ability to meet nutritional needs PO despite continuous TF infusion as TF will not be providing enough calories/protein to cause satiety until goal rate is reached  -Calorie count per MD; RD will follow-up with results on Monday, 4/25  -Continue Ensure Enlive po TID, each supplement provides 350 kcal and 20 grams of protein -Continue feeding assistance    NUTRITION DIAGNOSIS:  Increased nutrient needs related to wound healing (3 stage 4 PI and a DTI to left heel) as evidenced by estimated needs. - ongoing  GOAL:  Patient will meet greater than or equal to 90% of their needs - not met  MONITOR:  PO intake,Supplement acceptance,Labs,Skin,Weight trends  REASON FOR ASSESSMENT:  Consult Enteral/tube feeding initiation and management,Calorie Count  ASSESSMENT:  Patient is a 87 yo 54male with presents with multiple wounds chronic stage 4 PI x 2, right ankle stage 4,  left heel DTI. History of PVD, HTN, hypothyroidism.   04/11 diet advanced to dysphagia 2 with thin liquids 04/14 diet changed to soft; s/p L knee aspiration and injection 04/16 s/p I&D L total knee arthroplasty with radical synovectomy and application of negative pressure incisional dressingdiet downgraded to fulls then advanced to heart healthy 04/17 diet downgraded to dysphagia 1 with thin liquids   04/18 5% albumin given  Pt continues to be lethargic and has exhibited little to no improvement. Despite pt's poor prognosis, pt's family not yet ready to transition to comfort and wish to pursue full scope including trial of Cortrak placement and TF given pt with no/minimal PO intake. MD wishes for calorie count to be conducted at the same time as the tube feeding trial, so will continue orders for PO supplements in case pt begins accepting the supplements once/if mentation improves.  UOP: 2600ml x26mours JP Drain: 20ml ou68m x24 hours 1x unmeasured stool occurrence today, occurred after suppository per RN I/O: +2571.2ml [id number]  Medications: colace, megace, remeron, mvi with minerals, juven BID, protonix, miralax, senokot-s, sodium bicarbonate BID, vitamin B12 Labs: Cr 2.89 (H, trending down)  Diet Order:   Diet Order            DIET - DYS 1 Room service appropriate? No; Fluid consistency: Thin  Diet effective now                 EDUCATION NEEDS:  Education needs have been addressed  Skin:  Skin Assessment: Skin Integrity Issues: Skin Integrity Issues:: Stage IV Stage IV: 2 sacral stge 4 and a stage 4 to right ankle, DTI left heel  Last BM:  4/21  Height:  Ht Readings from Last 1 Encounters:  02/26/21 5' 4.02" (1.626 m)    Weight:  Wt Readings from Last 1 Encounters:  03/02/21 99 kg   BMI:  Body mass index is 37.44 kg/m.  Estimated Nutritional Needs:  Kcal:  [phone]  103-110 gr Fluid:  >1500 ml daily  Larkin Ina, MS, RD, LDN RD pager number and weekend/on-call pager number located in [place].

## 2021-03-08 NOTE — Progress Notes (Addendum)
PROGRESS NOTE    [name]  ACZ:[id number] DOB: 01-27-1929 DOA: 03/15/2021 PCP: Rosita Fire, MD   Chief Complaint  Patient presents with  . Weakness  Brief Narrative: 87 year old female with a PMH of Chronic Dementia, Peripheral Vascular Disease, and multiple medical problems presented to [facility] ED on 4/11 with confusion, weakness, mild AKI (Creatinine 2.53 with baseline ~ 1 mg/dL) and acute swelling and redness of the left knee consistent with septic joint.    On 2/27 - patient sustained a periprosthetic femur fracture s/p mechanical fall and underwent a Left Femur Replacement performed by Dr. Lyla Glassing.     On 4/14 - Left Knee Arthrocentesis performed by Ortho.  WBC was 100K.  Cultures are growing GPCs.  On 4/16 - I&D, Left Total Knee Arthroplasty with Radical Synovectomy performed by Ortho Dr. Lyla Glassing.  Intraoperative cultures sent.  On 4/18 - Hb was 5.5 g/dL, received 1 unit pRBC  4/14 Blood Culture: NG 4/14 Urine Culture: Enterococcus Avium  Subjective:  Patient is drowsy, has her eye closed, but following commands, states she is at [facility]. She is not oriented to time She denies pain this am She has mittens on , currently no agitation She is on 2liter oxygen supplement, though I do not know when she was put on oxygen and for how long, no respiratory distress, no hypoxia on 2liter o2, no cough  2.6liter urine output documented last 24hrs, + indwelling foley  I donot see any documentation of bm  Wound vac on, left knee immobilizer on  Dysphagia 1 diet ordered, There was a discussion regarding tube feeds, will see if she can swallow today, will start calorie count    Assessment & Plan:  Acute Anemia - postop blood loss in the setting of AOCKD:  -Hemoglobin nadir at 5.6 on 4/18 -So far has received total 4 PRBC since 4/16 -Currently on Pletal and heparin subcu for DVT prophylaxis,  - GI consulted , no overt GI bleed , no melena or hematochezia ,  GI signed off on 4/1 9  - she has been on IV Protonix 40 mg BID since 4/18, no bm documented for several days , less likely gi blood loss, will check FOBT, will change to ppi daily for now  Left Knee Prosthetic Joint Infection s/p 4/16 Resection Arthroplasty and Radical Synovectomy: - 4/14 aspiration cultures showed GPCs in pairs and in chains. - 4/16 intraoperative cultures are not uploaded.  We will call the lab.  Ortho is following, appreciate. - On Day 5 of Daptomycin 6 mg/kg QD and Ceftriaxone 2g QD.  Patient will need at least 6 weeks of IV therapy followed by oral drug therapy based on susceptibilities plus Rifampin 300-450 mg PO BID.  Infectious Disease is following, appreciate. - Monitor LFTs and CK weekly while on Daptomycin. - On 4/17, PICC line was placed.     Left Lower Extremity Non-Purulent Cellulitis / Osteomyelitis with h/o PVD: - MRI of ankle shows findings of early osteomyelitis. - Consult Vascular Surgery if family agrees. - Infectious Disease is following, appreciate.   Stage 4 Sacral Ulcer, POA: - MRI of sacrum is pending. - Continue antibiotics as above.   Enterococcus Avium UTI: 4/14 Urine cultures are resistant to Ampicillin and sensitive to Vancomycin. - Completed 7 days of Vancomycin.  Leukocytosis: -WBC appear peak at 21.4 ,WBC has decreased to 11.2K.  This is due to ongoing infection.   - Monitor daily.  Acute Kidney Injury with mild metabolic acidosis: - Creatinine  increased from 2 to 3 mg/dL over the last few days.  Baseline ~ 1.1 mg/dL. - I/O (+) 4.7 L since admission.  UO is 650 CCs x 24 hours. - treated with  IV Bumex to 2 mg BID, discontinued on 4/21 per nephrology recommendation - 4/18 Abdominal CT shows no obstructive uropathy and no renal mass that was reported on ultrasound. -We will follow nephrology recommendation  Acute Toxic Metabolic Encephalopathy in the setting of Chronic Dementia - this is due to acute infection in the setting of  worsening dementia: - Mental status waxes and wanes.    Paroxysmal Atrial Fibrillation: - Continue Metoprolol for rate control. - Hold anticoagulation due to high bleeding risk. - Monitor on telemetry.  Grade 1 Diastolic Heart Failure: 0/09 Echo showed Grade 1 DD and normal EF. - Echo also showed small LV cavity with hyperdynamic function.  Continue beta blocker for afterload reduction.    Physical Deconditioning: - Since family does not want  comfort care, may need  SNF [facility] once able to discharge.  Severe Protein Calorie Malnutrition: - Albumin is 1.4 mmol/L.  Neurological Oropharyngeal Dysphagia: -Family agreed to core track tube feeds, order placed  Chronic Anemia - B12 and IDA: HH is stable. Essential Hypertension: BP is stable. Hold off on meds. GERD: PPI Hypothyroidism: Synthroid 2010 Cecal Mass s/p right hemicolectomy  Goals of Care: Given worsening renal failure, ongoing anemia, poor nutritional status with albumin of 1.4 mmol/L, neurological dysphagia, large sacral wounds, and multiple complicated infections requiring lifelong antibiotics - we recommend comfort care measures.  We will continue daily discussions with family.  Palliative team is following up with family daily, appreciate. family are still processing their mother's terminal disease, and would like to continue care and full code status.  Diet Order            DIET - DYS 1 Room service appropriate? No; Fluid consistency: Thin  Diet effective now                 Nutrition Problem: Increased nutrient needs Etiology: wound healing (3 stage 4 PI and a DTI to left heel) Signs/Symptoms: estimated needs Interventions: Ensure Enlive (each supplement provides 350kcal and 20 grams of protein),MVI,Juven,Prostat Patient's Body mass index is 37.44 kg/m.  Pressure Injury 02/22/2021 Sacrum Mid Stage 4 - Full thickness tissue loss with exposed bone, tendon or muscle. tunneling (Active)  03/13/2021 2105  Location:  Sacrum  Location Orientation: Mid  Staging: Stage 4 - Full thickness tissue loss with exposed bone, tendon or muscle.  Wound Description (Comments): tunneling  Present on Admission: Yes     Pressure Injury 03/13/2021 Sacrum Mid Stage 4 - Full thickness tissue loss with exposed bone, tendon or muscle. tunneling (Active)  02/26/2021 2106  Location: Sacrum  Location Orientation: Mid  Staging: Stage 4 - Full thickness tissue loss with exposed bone, tendon or muscle.  Wound Description (Comments): tunneling  Present on Admission: Yes     Pressure Injury 02/26/21 Ankle Right;Lateral Stage 4 - Full thickness tissue loss with exposed bone, tendon or muscle. (Active)  02/26/21 0202  Location: Ankle  Location Orientation: Right;Lateral  Staging: Stage 4 - Full thickness tissue loss with exposed bone, tendon or muscle.  Wound Description (Comments):   Present on Admission:      Pressure Injury 02/26/21 Foot Left;Lateral Deep Tissue Pressure Injury - Purple or maroon localized area of discolored intact skin or blood-filled blister due to damage of underlying soft tissue from pressure and/or shear. (Active)  02/26/21 0158  Location: Foot  Location Orientation: Left;Lateral  Staging: Deep Tissue Pressure Injury - Purple or maroon localized area of discolored intact skin or blood-filled blister due to damage of underlying soft tissue from pressure and/or shear.  Wound Description (Comments):   Present on Admission: Yes    DVT prophylaxis: heparin injection 5,000 Units Start: 03/07/21 1000 SCDs Start: 02/19/2021 1405 Code Status:   Code Status: Full Code  Family Communication:  will call family  Status is: Inpatient Remains inpatient appropriate because:IV treatments appropriate due to intensity of illness or inability to take PO and Inpatient level of care appropriate due to severity of illness  Dispo: The patient is from: Home              Anticipated d/c is to: TBD SNF vs LTAC vs  Hospice               Patient currently is not medically stable to d/c.   Difficult to place patient No Unresulted Labs (From admission, onward)          Start     Ordered   03/12/21 0500  CK  Every Monday (0500),   R     Question:  Specimen collection method  Answer:  IV Team=IV Team collect   03/06/21 0829   03/10/21 0500  Creatinine, serum  (enoxaparin (LOVENOX)  CrCl >/= 30 mL/min  )  Weekly,   R     Comments: while on enoxaparin therapy.   Question:  Specimen collection method  Answer:  Lab=Lab collect   03/08/2021 1404   03/04/21 0500  CBC with Differential/Platelet  Daily,   R     Question:  Specimen collection method  Answer:  Lab=Lab collect   02/18/2021 1322   Unscheduled  Occult blood card to lab, stool  As needed,   R      03/05/21 1409        Medications reviewed:  Scheduled Meds: . bumetanide (BUMEX) IV  2 mg Intravenous BID  . Chlorhexidine Gluconate Cloth  6 each Topical Daily  . cilostazol  100 mg Oral BID  . docusate sodium  100 mg Oral BID  . feeding supplement  237 mL Oral TID BM  . heparin injection (subcutaneous)  5,000 Units Subcutaneous Q12H  . levothyroxine  100 mcg Oral Q0600  . mouth rinse  15 mL Mouth Rinse BID  . megestrol  400 mg Oral Daily  . metoprolol tartrate  25 mg Oral BID  . mirtazapine  7.5 mg Oral QHS  . multivitamin with minerals  1 tablet Oral Daily  . nutrition supplement (JUVEN)  1 packet Oral BID BM  . pantoprazole (PROTONIX) IV  40 mg Intravenous Q12H  . polyethylene glycol  17 g Oral Daily  . senna-docusate  1 tablet Oral QHS  . sodium bicarbonate  1,300 mg Oral TID  . sodium chloride flush  10-40 mL Intracatheter Q12H  . vitamin B-12  1,000 mcg Oral Daily   Continuous Infusions: . cefTRIAXone (ROCEPHIN)  IV Stopped (03/07/21 2304)  . DAPTOmycin (CUBICIN)  IV Stopped (03/07/21 2125)    Consultants:see note , ID, palliative care, nephrology, GI, Ortho Procedures:see note  Antimicrobials: Anti-infectives (From admission, onward)   Start      Dose/Rate Route Frequency Ordered Stop   03/05/21 2000  DAPTOmycin (CUBICIN) 500 mg in sodium chloride 0.9 % IVPB        500 mg 120 mL/hr over 30 Minutes Intravenous Every 48 hours 02/17/2021  1517     03/11/2021 2300  cefTRIAXone (ROCEPHIN) 2 g in sodium chloride 0.9 % 100 mL IVPB        2 g 200 mL/hr over 30 Minutes Intravenous Every 24 hours 03/13/2021 2236     03/02/2021 2000  DAPTOmycin (CUBICIN) 500 mg in sodium chloride 0.9 % IVPB  Status:  Discontinued        8 mg/kg  61.3 kg 120 mL/hr over 30 Minutes Intravenous Every 48 hours 03/02/21 0855 03/04/2021 1320   02/17/2021 1500  DAPTOmycin (CUBICIN) 600 mg in sodium chloride 0.9 % IVPB  Status:  Discontinued        6 mg/kg  99 kg 124 mL/hr over 30 Minutes Intravenous Daily 03/04/2021 1400 03/17/2021 1402   03/06/2021 1500  DAPTOmycin (CUBICIN) 500 mg in sodium chloride 0.9 % IVPB  Status:  Discontinued        500 mg 120 mL/hr over 30 Minutes Intravenous Daily 03/07/2021 1405 03/09/2021 1517   03/14/2021 1045  tobramycin (NEBCIN) powder  Status:  Discontinued          As needed 03/07/2021 1045 03/07/2021 1201   03/16/2021 1037  vancomycin (VANCOCIN) powder  Status:  Discontinued          As needed 03/06/2021 1044 03/05/2021 1201   03/17/2021 0800  vancomycin (VANCOCIN) IVPB 1000 mg/200 mL premix        1,000 mg 200 mL/hr over 60 Minutes Intravenous On call to O.R. 03/08/2021 0752 03/10/2021 1040   03/02/21 2200  cefTRIAXone (ROCEPHIN) injection 2 g  Status:  Discontinued        2 g Intramuscular Every 24 hours 03/02/21 0904 03/17/2021 2235   03/02/21 0600  ceFAZolin (ANCEF) IVPB 2g/100 mL premix  Status:  Discontinued        2 g 200 mL/hr over 30 Minutes Intravenous On call to O.R. 03/02/21 0110 03/16/2021 0559   02/27/21 2200  vancomycin (VANCOREADY) IVPB 750 mg/150 mL  Status:  Discontinued        750 mg 150 mL/hr over 60 Minutes Intravenous Every 48 hours 03/06/2021 2342 03/02/21 0855   02/27/2021 2315  vancomycin (VANCOCIN) IVPB 1000 mg/200 mL premix        1,000 mg 200  mL/hr over 60 Minutes Intravenous  Once 02/17/2021 2313 02/26/21 0115   03/11/2021 2315  ceFEPIme (MAXIPIME) 2 g in sodium chloride 0.9 % 100 mL IVPB  Status:  Discontinued        2 g 200 mL/hr over 30 Minutes Intravenous Every 24 hours 02/24/2021 2313 03/02/21 0904     Culture/Microbiology    Component Value Date/Time   SDES FLUID 03/01/2021 1102   SPECREQUEST SYNOVIAL LEFT KNEE 03/01/2021 1102   CULT  03/01/2021 1102    NO GROWTH 3 DAYS Performed at [facility], [place] [address], [place], [place] [address]    REPTSTATUS 03/05/2021 FINAL 03/01/2021 1102    Other culture-see note  Objective: Vitals: Today's Vitals   03/07/21 1823 03/07/21 2030 03/08/21 0620 03/08/21 0753  BP: (!) 145/80 108/84 (!) 141/68 124/61  Pulse:  77 74 72  Resp:  18 16   Temp:   97.6 F (36.4 C)   TempSrc:  Axillary    SpO2:  100% 100%   Weight:      Height:      PainSc:        Intake/Output Summary (Last 24 hours) at 03/08/2021 1055 Last data filed at 03/08/2021 0648 Gross per 24 hour  Intake 468.53 ml  Output 2620 ml  Net -2151.47 ml   Filed Weights   02/26/21 0148 02/28/21 0500 03/02/21 1024  Weight: 61.2 kg 61.3 kg 99 kg   Weight change:   Intake/Output from previous day: 04/20 0701 - 04/21 0700 In: 668.5 [P.O.:250; IV Piggyback:418.5] Out: 2620 [Urine:2600; Drains:20] Intake/Output this shift: No intake/output data recorded. Filed Weights   02/26/21 0148 02/28/21 0500 03/02/21 1024  Weight: 61.2 kg 61.3 kg 99 kg    Examination: General exam: AAOx1-2, ill looking, elderly, frail and weak.  PICC line to right arm,+  indwelling Foley catheter, she is on 2liter oxygen HEENT:Oral mucosa moist, Ear/Nose WNL grossly, dentition normal. Respiratory system: bilaterally diminished,no wheezing or crackles,no use of accessory muscle Cardiovascular system: S1 & S2 +, No JVD, no significant murmurs Gastrointestinal system: Abdomen soft, NT,ND, BS+ Nervous System:Alert, awake, moving  extremities and grossly nonfocal Extremities: No edema, distal peripheral pulses palpable.  Left knee area is tender left lower extremity swollen, positive wound VAC,  Skin: sacral lesions noted MSK: physical deconditioning  Data Reviewed: I have personally reviewed following labs and imaging studies CBC: Recent Labs  Lab 03/04/21 0733 03/05/21 0323 03/05/21 1126 03/06/21 0305 03/06/21 1010 03/06/21 1800 03/07/21 0355 03/07/21 1830 03/08/21 0500  WBC 21.4* 17.6* 15.6* 13.5*  --   --  13.6*  --  11.2*  NEUTROABS 18.1* 14.5*  --  10.9*  --   --  11.2*  --  9.6*  HGB 7.3* 5.6* 7.0* 6.9* 6.8* 8.4* 8.5* 8.9* 8.7*  HCT 22.4* 17.5* 21.4* 21.6* 21.1* 25.3* 26.5* 27.5* 27.2*  MCV 90.3 93.6 88.4 87.8  --   --  88.3  --  88.3  PLT 220 182 187 197  --   --  258  --  341   Basic Metabolic Panel: Recent Labs  Lab 03/04/21 0740 03/05/21 0323 03/05/21 1831 03/06/21 0305 03/07/21 0355 03/07/21 1830 03/08/21 0500  NA 142   < > 138 139 142 141 143  K 4.6   < > 4.4 4.5 4.3 4.0 3.6  CL 115*   < > 112* 112* 114* 112* 113*  CO2 20*   < > 21* 23 19* 21* 22  GLUCOSE 124*   < > 107* 84 75 96 73  BUN 82*   < > 85* 84* 84* 80* 75*  CREATININE 2.48*   < > 2.99* 3.06* 2.94* 2.91* 2.89*  CALCIUM 8.2*   < > 8.3* 8.3* 8.7* 8.8* 8.9  PHOS 3.5  --   --   --   --   --   --    < > = values in this interval not displayed.   GFR: Estimated Creatinine Clearance: 15.1 mL/min (A) (by C-G formula based on SCr of 2.89 mg/dL (H)). Liver Function Tests: Recent Labs  Lab 03/02/21 0151 03/14/2021 0103 03/04/21 0519 03/04/21 0740  AST 19 24 32  --   ALT 11 13 9   --   ALKPHOS 59 56 48  --   BILITOT 0.7 0.4 0.8  --   PROT 5.7* 5.8* 5.0*  --   ALBUMIN 1.4* 1.4* 1.3* 1.4*   Coagulation Profile: No results for input(s): INR, PROTIME in the last 168 hours. Cardiac Enzymes: Recent Labs  Lab 03/12/2021 0103 03/04/21 0519 03/06/21 0305  CKTOTAL 69 97 87   CBG: No results for input(s): GLUCAP in the last  168 hours. Sepsis Labs: Recent Labs  Lab 03/04/21 1715  LATICACIDVEN 1.2    Recent  Results (from the past 240 hour(s))  Body fluid culture w Gram Stain     Status: None   Collection Time: 03/01/21 11:02 AM   Specimen: Body Fluid  Result Value Ref Range Status   Specimen Description FLUID  Final   Special Requests SYNOVIAL LEFT KNEE  Final   Gram Stain   Final    ABUNDANT WBC PRESENT, PREDOMINANTLY MONONUCLEAR ABUNDANT GRAM POSITIVE COCCI IN PAIRS IN CHAINS    Culture   Final    NO GROWTH 3 DAYS Performed at [facility], [place] [address], [place], [place] [address]    Report Status 03/05/2021 FINAL  Final     Radiology Studies: MR ANKLE RIGHT WO CONTRAST  Result Date: 03/07/2021 CLINICAL DATA:  Right lateral ankle wound. Evaluate for osteomyelitis. EXAM: MRI OF THE RIGHT ANKLE WITHOUT CONTRAST TECHNIQUE: Multiplanar, multisequence MR imaging of the ankle was performed. No intravenous contrast was administered. COMPARISON:  Right foot x-rays dated January 10, 2021. Right ankle x-rays dated January 08, 2021. FINDINGS: TENDONS Peroneal: Peroneal longus tendon intact. Peroneal brevis intact. Posteromedial: Posterior tibial tendon intact. Flexor digitorum longus tendon intact. Flexor hallucis longus tendon intact. Anterior: Tibialis anterior tendon intact. Extensor hallucis longus tendon intact Extensor digitorum longus tendon intact. Achilles:  Intact. Plantar Fascia: Intact. Mild thickening of the proximal central band. LIGAMENTS Lateral: Anterior talofibular ligament intact. Calcaneofibular ligament intact. Posterior talofibular ligament intact. Anterior and posterior tibiofibular ligaments intact. Medial: Deltoid ligament intact. Spring ligament intact. CARTILAGE Ankle Joint: No significant joint effusion. Normal ankle mortise. No chondral defect. Subtalar Joints/Sinus Tarsi: Normal subtalar joints. No subtalar joint effusion. Normal sinus tarsi. Bones: Mild marrow edema within the  lateral malleolus. T1 marrow signal is mostly preserved. No acute fracture or dislocation. No suspicious bone lesion. Mild navicular-medial cuneiform osteoarthritis. Soft Tissue: Near circumferential lower leg and hindfoot soft tissue swelling extending into the dorsolateral forefoot, sparing the medial aspect. Soft tissue ulceration over the lateral malleolus. No fluid collection. IMPRESSION: 1. Soft tissue ulceration over the lateral malleolus with underlying mild marrow edema, suspicious for early osteomyelitis. No abscess. Electronically Signed   By: Titus Dubin M.D.   On: 03/07/2021 12:13     LOS: 11 days   Florencia Reasons, MD PhD FACP [facility]  03/08/2021, 10:54 AM

## 2021-03-08 NOTE — Progress Notes (Signed)
Subjective:  She says she feels " all right"   Antibiotics:  Anti-infectives (From admission, onward)   Start     Dose/Rate Route Frequency Ordered Stop   03/05/21 2000  DAPTOmycin (CUBICIN) 500 mg in sodium chloride 0.9 % IVPB        500 mg 120 mL/hr over 30 Minutes Intravenous Every 48 hours 03/15/2021 1517     03/06/2021 2300  cefTRIAXone (ROCEPHIN) 2 g in sodium chloride 0.9 % 100 mL IVPB        2 g 200 mL/hr over 30 Minutes Intravenous Every 24 hours 03/05/2021 2236     03/11/2021 2000  DAPTOmycin (CUBICIN) 500 mg in sodium chloride 0.9 % IVPB  Status:  Discontinued        8 mg/kg  61.3 kg 120 mL/hr over 30 Minutes Intravenous Every 48 hours 03/02/21 0855 02/28/2021 1320   03/12/2021 1500  DAPTOmycin (CUBICIN) 600 mg in sodium chloride 0.9 % IVPB  Status:  Discontinued        6 mg/kg  99 kg 124 mL/hr over 30 Minutes Intravenous Daily 02/24/2021 1400 03/09/2021 1402   02/19/2021 1500  DAPTOmycin (CUBICIN) 500 mg in sodium chloride 0.9 % IVPB  Status:  Discontinued        500 mg 120 mL/hr over 30 Minutes Intravenous Daily 03/06/2021 1405 03/10/2021 1517   02/21/2021 1045  tobramycin (NEBCIN) powder  Status:  Discontinued          As needed 03/17/2021 1045 02/24/2021 1201   03/10/2021 1037  vancomycin (VANCOCIN) powder  Status:  Discontinued          As needed 03/06/2021 1044 03/07/2021 1201   03/15/2021 0800  vancomycin (VANCOCIN) IVPB 1000 mg/200 mL premix        1,000 mg 200 mL/hr over 60 Minutes Intravenous On call to O.R. 02/20/2021 0752 02/16/2021 1040   03/02/21 2200  cefTRIAXone (ROCEPHIN) injection 2 g  Status:  Discontinued        2 g Intramuscular Every 24 hours 03/02/21 0904 02/17/2021 2235   03/02/21 0600  ceFAZolin (ANCEF) IVPB 2g/100 mL premix  Status:  Discontinued        2 g 200 mL/hr over 30 Minutes Intravenous On call to O.R. 03/02/21 0110 03/05/2021 0559   02/27/21 2200  vancomycin (VANCOREADY) IVPB 750 mg/150 mL  Status:  Discontinued        750 mg 150 mL/hr over 60 Minutes  Intravenous Every 48 hours 03/17/2021 2342 03/02/21 0855   02/17/2021 2315  vancomycin (VANCOCIN) IVPB 1000 mg/200 mL premix        1,000 mg 200 mL/hr over 60 Minutes Intravenous  Once 02/24/2021 2313 02/26/21 0115   02/28/2021 2315  ceFEPIme (MAXIPIME) 2 g in sodium chloride 0.9 % 100 mL IVPB  Status:  Discontinued        2 g 200 mL/hr over 30 Minutes Intravenous Every 24 hours 03/05/2021 2313 03/02/21 0904      Medications: Scheduled Meds: . bisacodyl  10 mg Rectal Once  . bumetanide (BUMEX) IV  2 mg Intravenous BID  . Chlorhexidine Gluconate Cloth  6 each Topical Daily  . cilostazol  100 mg Oral BID  . docusate sodium  100 mg Oral BID  . feeding supplement  237 mL Oral TID BM  . heparin injection (subcutaneous)  5,000 Units Subcutaneous Q12H  . levothyroxine  100 mcg Oral Q0600  . mouth rinse  15 mL Mouth Rinse BID  .  megestrol  400 mg Oral Daily  . metoprolol tartrate  25 mg Oral BID  . mirtazapine  7.5 mg Oral QHS  . multivitamin with minerals  1 tablet Oral Daily  . nutrition supplement (JUVEN)  1 packet Oral BID BM  . pantoprazole (PROTONIX) IV  40 mg Intravenous Q12H  . polyethylene glycol  17 g Oral Daily  . senna-docusate  1 tablet Oral QHS  . sodium bicarbonate  1,300 mg Oral TID  . sodium chloride flush  10-40 mL Intracatheter Q12H  . vitamin B-12  1,000 mcg Oral Daily   Continuous Infusions: . cefTRIAXone (ROCEPHIN)  IV Stopped (03/07/21 2304)  . DAPTOmycin (CUBICIN)  IV Stopped (03/07/21 2125)   PRN Meds:.acetaminophen, alum & mag hydroxide-simeth, haloperidol lactate, menthol-cetylpyridinium **OR** phenol, metoCLOPramide **OR** metoCLOPramide (REGLAN) injection, metoprolol tartrate, ondansetron **OR** ondansetron (ZOFRAN) IV, sodium chloride flush    Objective: Weight change:   Intake/Output Summary (Last 24 hours) at 03/08/2021 1300 Last data filed at 03/08/2021 0648 Gross per 24 hour  Intake 468.53 ml  Output 2620 ml  Net -2151.47 ml   Blood pressure 124/61,  pulse 72, temperature 97.6 F (36.4 C), resp. rate 16, height 5' 4.02" (1.626 m), weight 99 kg, SpO2 100 %. Temp:  [97.6 F (36.4 C)] 97.6 F (36.4 C) (04/21 0620) Pulse Rate:  [72-77] 72 (04/21 0753) Resp:  [16-18] 16 (04/21 0620) BP: (108-145)/(61-86) 124/61 (04/21 0753) SpO2:  [100 %] 100 % (04/21 6283)  Physical Exam: Physical Exam Constitutional:      General: She is not in acute distress.    Appearance: She is well-developed. She is ill-appearing. She is not diaphoretic.  HENT:     Head: Normocephalic and atraumatic.     Right Ear: External ear normal.     Left Ear: External ear normal.     Mouth/Throat:     Pharynx: No oropharyngeal exudate.  Eyes:     General: No scleral icterus. Cardiovascular:     Rate and Rhythm: Normal rate and regular rhythm.     Heart sounds: Normal heart sounds. No murmur heard. No friction rub. No gallop.   Pulmonary:     Effort: Pulmonary effort is normal. No respiratory distress.     Breath sounds: Normal breath sounds. No wheezing or rales.  Abdominal:     General: Bowel sounds are normal. There is no distension.     Palpations: Abdomen is soft.     Tenderness: There is no abdominal tenderness. There is no rebound.  Musculoskeletal:        General: No tenderness. Normal range of motion.  Lymphadenopathy:     Cervical: No cervical adenopathy.  Skin:    General: Skin is warm and dry.     Coloration: Skin is pale.     Findings: No erythema or rash.  Neurological:     Mental Status: She is disoriented.     Motor: No abnormal muscle tone.     Coordination: Coordination normal.  Psychiatric:        Speech: She is noncommunicative.        Cognition and Memory: Cognition is impaired. Memory is impaired. She exhibits impaired recent memory.     Knee with dressing in place CBC:    BMET Recent Labs    03/07/21 1830 03/08/21 0500  NA 141 143  K 4.0 3.6  CL 112* 113*  CO2 21* 22  GLUCOSE 96 73  BUN 80* 75*  CREATININE 2.91*  2.89*  CALCIUM 8.8* 8.9  Liver Panel  No results for input(s): PROT, ALBUMIN, AST, ALT, ALKPHOS, BILITOT, BILIDIR, IBILI in the last 72 hours.     Sedimentation Rate No results for input(s): ESRSEDRATE in the last 72 hours. C-Reactive Protein No results for input(s): CRP in the last 72 hours.  Micro Results: Recent Results (from the past 720 hour(s))  Blood culture (routine single)     Status: None   Collection Time: 03/04/2021  8:57 PM   Specimen: Right Antecubital; Blood  Result Value Ref Range Status   Specimen Description RIGHT ANTECUBITAL  Final   Special Requests   Final    BOTTLES DRAWN AEROBIC AND ANAEROBIC Blood Culture adequate volume   Culture   Final    NO GROWTH 5 DAYS Performed at [facility], [address] [place], [place] [address]    Report Status 03/02/2021 FINAL  Final  Resp Panel by RT-PCR (Flu A&B, Covid) Nasopharyngeal Swab     Status: None   Collection Time: 03/06/2021  9:00 PM   Specimen: Nasopharyngeal Swab; Nasopharyngeal(NP) swabs in vial transport medium  Result Value Ref Range Status   SARS Coronavirus 2 by RT PCR NEGATIVE NEGATIVE Final    Comment: (NOTE) SARS-CoV-2 target nucleic acids are NOT DETECTED.  The SARS-CoV-2 RNA is generally detectable in upper respiratory specimens during the acute phase of infection. The lowest concentration of SARS-CoV-2 viral copies this assay can detect is 138 copies/mL. A negative result does not preclude SARS-Cov-2 infection and should not be used as the sole basis for treatment or other patient management decisions. A negative result may occur with  improper specimen collection/handling, submission of specimen other than nasopharyngeal swab, presence of viral mutation(s) within the areas targeted by this assay, and inadequate number of viral copies(<138 copies/mL). A negative result must be combined with clinical observations, patient history, and epidemiological information. The expected result is  Negative.  Fact Sheet for Patients:  EntrepreneurPulse.com.au  Fact Sheet for Healthcare Providers:  IncredibleEmployment.be  This test is no t yet approved or cleared by the [place] FDA and  has been authorized for detection and/or diagnosis of SARS-CoV-2 by FDA under an Emergency Use Authorization (EUA). This EUA will remain  in effect (meaning this test can be used) for the duration of the COVID-19 declaration under Section 564(b)(1) of the Act, 21 U.S.C.section 360bbb-3(b)(1), unless the authorization is terminated  or revoked sooner.       Influenza A by PCR NEGATIVE NEGATIVE Final   Influenza B by PCR NEGATIVE NEGATIVE Final    Comment: (NOTE) The Xpert Xpress SARS-CoV-2/FLU/RSV plus assay is intended as an aid in the diagnosis of influenza from Nasopharyngeal swab specimens and should not be used as a sole basis for treatment. Nasal washings and aspirates are unacceptable for Xpert Xpress SARS-CoV-2/FLU/RSV testing.  Fact Sheet for Patients: EntrepreneurPulse.com.au  Fact Sheet for Healthcare Providers: IncredibleEmployment.be  This test is not yet approved or cleared by the [place] FDA and has been authorized for detection and/or diagnosis of SARS-CoV-2 by FDA under an Emergency Use Authorization (EUA). This EUA will remain in effect (meaning this test can be used) for the duration of the COVID-19 declaration under Section 564(b)(1) of the Act, 21 U.S.C. section 360bbb-3(b)(1), unless the authorization is terminated or revoked.  Performed at [facility], [address] [place], [place] [address]   Urine culture     Status: Abnormal   Collection Time: 03/02/2021 11:57 PM   Specimen: In/Out Cath Urine  Result Value Ref Range  Status   Specimen Description   Final    IN/OUT CATH URINE Performed at [facility], [address] [place], [place] [address]    Special Requests   Final     NONE Performed at [facility], [address], [place], [place] [address]    Culture 80,000 COLONIES/mL ENTEROCOCCUS AVIUM (A)  Final   Report Status 03/01/2021 FINAL  Final   Organism ID, Bacteria ENTEROCOCCUS AVIUM (A)  Final      Susceptibility   Enterococcus avium - MIC*    AMPICILLIN 16 RESISTANT Resistant     NITROFURANTOIN <=16 SENSITIVE Sensitive     VANCOMYCIN <=0.5 SENSITIVE Sensitive     * 80,000 COLONIES/mL ENTEROCOCCUS AVIUM  Body fluid culture w Gram Stain     Status: None   Collection Time: 03/01/21 11:02 AM   Specimen: Body Fluid  Result Value Ref Range Status   Specimen Description FLUID  Final   Special Requests SYNOVIAL LEFT KNEE  Final   Gram Stain   Final    ABUNDANT WBC PRESENT, PREDOMINANTLY MONONUCLEAR ABUNDANT GRAM POSITIVE COCCI IN PAIRS IN CHAINS    Culture   Final    NO GROWTH 3 DAYS Performed at [facility], [place] [address] [place], [place] [address]    Report Status 03/05/2021 FINAL  Final    Studies/Results: MR ANKLE RIGHT WO CONTRAST  Result Date: 03/07/2021 CLINICAL DATA:  Right lateral ankle wound. Evaluate for osteomyelitis. EXAM: MRI OF THE RIGHT ANKLE WITHOUT CONTRAST TECHNIQUE: Multiplanar, multisequence MR imaging of the ankle was performed. No intravenous contrast was administered. COMPARISON:  Right foot x-rays dated January 10, 2021. Right ankle x-rays dated January 08, 2021. FINDINGS: TENDONS Peroneal: Peroneal longus tendon intact. Peroneal brevis intact. Posteromedial: Posterior tibial tendon intact. Flexor digitorum longus tendon intact. Flexor hallucis longus tendon intact. Anterior: Tibialis anterior tendon intact. Extensor hallucis longus tendon intact Extensor digitorum longus tendon intact. Achilles:  Intact. Plantar Fascia: Intact. Mild thickening of the proximal central band. LIGAMENTS Lateral: Anterior talofibular ligament intact. Calcaneofibular ligament intact. Posterior talofibular ligament intact. Anterior and  posterior tibiofibular ligaments intact. Medial: Deltoid ligament intact. Spring ligament intact. CARTILAGE Ankle Joint: No significant joint effusion. Normal ankle mortise. No chondral defect. Subtalar Joints/Sinus Tarsi: Normal subtalar joints. No subtalar joint effusion. Normal sinus tarsi. Bones: Mild marrow edema within the lateral malleolus. T1 marrow signal is mostly preserved. No acute fracture or dislocation. No suspicious bone lesion. Mild navicular-medial cuneiform osteoarthritis. Soft Tissue: Near circumferential lower leg and hindfoot soft tissue swelling extending into the dorsolateral forefoot, sparing the medial aspect. Soft tissue ulceration over the lateral malleolus. No fluid collection. IMPRESSION: 1. Soft tissue ulceration over the lateral malleolus with underlying mild marrow edema, suspicious for early osteomyelitis. No abscess. Electronically Signed   By: Titus Dubin M.D.   On: 03/07/2021 12:13      Assessment/Plan:  INTERVAL HISTORY:   MRI of sacrum still not read  Principal Problem:   Severe Sepsis due to left leg cellulitis and possible Lt septic knee and Enterococcus UTI Active Problems:   Essential hypertension   PERIPHERAL VASCULAR DISEASE   GERD   Hypothyroidism   AKI (acute kidney injury) ([place])   Pressure injury of skin   Generalized weakness   Acute metabolic encephalopathy   Leukocytosis   Hyponatremia   Dehydration   Hypoalbuminemia   Elevated brain natriuretic peptide (BNP) level   Paroxysmal A-fib (HCC)   Altered mental status   Septic infrapatellar bursitis of left  knee   Infection of total knee replacement (HCC)   Medication monitoring encounter    JISSELL TRAFTON is a 87 y.o. female with history of recent distal femur fracture status post still femoral replacement discharged to skilled nursing facility but readmitted now with periprosthetic joint infection as well as to stage IV decubitus ulcers one in her sacral area and 1 in her  ankle.  Blood cultures were taken on admission and have not yielded any organisms.  She was taken to the operating room  while on vancomycin and ceftriaxone.  Cultures did not yield any organisms though the gram stain showed gram-positive cocci in pairs and chains  She has acute on chronic renal insufficiency (latter is obvious from her prior elevated creatinines  She continues to be encephalopathic  #1 Goals of care: Greatly appreciate palliative care seeing her.  Not seem that the family have very realistic expectations and I really think all of the aggressive care the patient is receiving is not helping to improve her experience of life now or for the foreseeable future  #2  Prosthetic knee infection: Continue daptomycin and ceftriaxone for now but I do not see what we are going to really accomplish here in context of her incurable decubitus ulcers, poor nutritional status and dementia   #3  Deep stage IV decubitus ulcer of sacral area and foot:  MRI of the ankle shows osteomyelitis.  To treat this she would need amputation below the knee.  Think she is healthy to undergo such a surgery  MRI of the sacral area what undoubtedly show osteomyelitis as well.  The fact that she developed these decubitus ulcer should be clear-cut evidence for [place] that the patient's trajectory has been downward for some time and is not going to be reversible.   #4 acute on chronic renal failure: Dr Justin Mend is following. Patient not making much urine.She has largely been off vancomycin since I first saw her though I do believe her cement from surgery contains vancomycin and tobramycin and these drugs could have made an impact on her renal function  Will continue to hope that family will eventually make the compassionate decision to change to comfort care.             LOS: 11 days   Alcide Evener 03/08/2021, 1:00 PM

## 2021-03-09 ENCOUNTER — Inpatient Hospital Stay (HOSPITAL_COMMUNITY): Payer: Medicare Other

## 2021-03-09 DIAGNOSIS — A419 Sepsis, unspecified organism: Secondary | ICD-10-CM | POA: Diagnosis not present

## 2021-03-09 DIAGNOSIS — N179 Acute kidney failure, unspecified: Secondary | ICD-10-CM | POA: Diagnosis not present

## 2021-03-09 DIAGNOSIS — T8459XA Infection and inflammatory reaction due to other internal joint prosthesis, initial encounter: Secondary | ICD-10-CM | POA: Diagnosis not present

## 2021-03-09 DIAGNOSIS — R410 Disorientation, unspecified: Secondary | ICD-10-CM | POA: Diagnosis not present

## 2021-03-09 DIAGNOSIS — G9341 Metabolic encephalopathy: Secondary | ICD-10-CM | POA: Diagnosis not present

## 2021-03-09 LAB — GLUCOSE, CAPILLARY
Glucose-Capillary: 64 mg/dL — ABNORMAL LOW (ref 70–99)
Glucose-Capillary: 111 mg/dL — ABNORMAL HIGH (ref 70–99)
Glucose-Capillary: 72 mg/dL (ref 70–99)
Glucose-Capillary: 97 mg/dL (ref 70–99)
Glucose-Capillary: 115 mg/dL — ABNORMAL HIGH (ref 70–99)

## 2021-03-09 LAB — CBC WITH DIFFERENTIAL/PLATELET
Abs Immature Granulocytes: 0.18 10*3/uL — ABNORMAL HIGH (ref 0.00–0.07)
Basophils Absolute: 0 10*3/uL (ref 0.0–0.1)
Basophils Relative: 0 %
Eosinophils Absolute: 0.2 10*3/uL (ref 0.0–0.5)
Eosinophils Relative: 1 %
HCT: 27.8 % — ABNORMAL LOW (ref 36.0–46.0)
Hemoglobin: 8.8 g/dL — ABNORMAL LOW (ref 12.0–15.0)
Immature Granulocytes: 2 %
Lymphocytes Relative: 7 %
Lymphs Abs: 0.8 10*3/uL (ref 0.7–4.0)
MCH: 28.4 pg (ref 26.0–34.0)
MCHC: 31.7 g/dL (ref 30.0–36.0)
MCV: 89.7 fL (ref 80.0–100.0)
Monocytes Absolute: 0.5 10*3/uL (ref 0.1–1.0)
Monocytes Relative: 4 %
Neutro Abs: 10.5 10*3/uL — ABNORMAL HIGH (ref 1.7–7.7)
Neutrophils Relative %: 86 %
Platelets: 333 10*3/uL (ref 150–400)
RBC: 3.1 MIL/uL — ABNORMAL LOW (ref 3.87–5.11)
RDW: 18.5 % — ABNORMAL HIGH (ref 11.5–15.5)
WBC: 12.2 10*3/uL — ABNORMAL HIGH (ref 4.0–10.5)
nRBC: 0 % (ref 0.0–0.2)

## 2021-03-09 LAB — BASIC METABOLIC PANEL
Anion gap: 8 (ref 5–15)
BUN: 73 mg/dL — ABNORMAL HIGH (ref 8–23)
CO2: 23 mmol/L (ref 22–32)
Calcium: 8.9 mg/dL (ref 8.9–10.3)
Chloride: 114 mmol/L — ABNORMAL HIGH (ref 98–111)
Creatinine, Ser: 2.74 mg/dL — ABNORMAL HIGH (ref 0.44–1.00)
GFR, Estimated: 16 mL/min — ABNORMAL LOW (ref >60)
Glucose, Bld: 67 mg/dL — ABNORMAL LOW (ref 70–99)
Potassium: 3.6 mmol/L (ref 3.5–5.1)
Sodium: 145 mmol/L (ref 135–145)

## 2021-03-09 LAB — MAGNESIUM
Magnesium: 2 mg/dL (ref 1.7–2.4)
Magnesium: 2 mg/dL (ref 1.7–2.4)

## 2021-03-09 LAB — PHOSPHORUS
Phosphorus: 4.5 mg/dL (ref 2.5–4.6)
Phosphorus: 4.4 mg/dL (ref 2.5–4.6)

## 2021-03-09 MED ORDER — DEXTROSE 50 % IV SOLN
12.5000 g | INTRAVENOUS | Status: AC
  Administered 2021-03-09: 12.5 g via INTRAVENOUS
  Filled 2021-03-09: qty 50

## 2021-03-09 NOTE — Progress Notes (Signed)
Patient took a few PO medications per [facility] before stating "im done" and refusing to open her mouth for additional medications. Attempted to feed patient breakfast and patient repeatedly spit out food shaking head "no."

## 2021-03-09 NOTE — Procedures (Signed)
Cortrak  Person Inserting Tube:  Khaleel Beckom, Creola Corn, RD Tube Type:  Cortrak - 43 inches Tube Location:  Left nare Initial Placement:  Stomach Secured by: Bridle Technique Used to Measure Tube Placement:  Documented cm marking at nare/ corner of mouth Cortrak Secured At:  61 cm    Cortrak Tube Team Note:  Consult received to place a Cortrak feeding tube.    X-ray is required, abdominal x-ray has been ordered by the Cortrak team. Please confirm tube placement before using the Cortrak tube.   If the tube becomes dislodged please keep the tube and contact the Cortrak team at [email or web address] (password [email or web address]) for replacement.  If after hours and replacement cannot be delayed, place a NG tube and confirm placement with an abdominal x-ray.    Larkin Ina, MS, RD, LDN RD pager number and weekend/on-call pager number located in [place].

## 2021-03-09 NOTE — Consult Note (Signed)
WOC Nurse Consult Note: Patient receiving care in [facility] 2W16. Reason for Consult: UE wound Wound type: abrasion to lower left forearm, small bruises various places on right arm.  None of these need any special topical dressings, just normal washing and dry. Pressure Injury POA: Yes/No/NA Measurement: Wound bed: Drainage (amount, consistency, odor)  Periwound: Dressing procedure/placement/frequency: WOC nurse will not follow at this time.  Please re-consult the [facility] team if needed.  Val Riles, RN, MSN, CWOCN, CNS-BC, pager [phone]

## 2021-03-09 NOTE — Progress Notes (Signed)
PT Cancellation Note  Patient Details Name: [name] MRN: [id number] DOB: 01-Mar-1929   Cancelled Treatment:    Reason Eval/Treat Not Completed: Other (comment).  Pt was attempted x 2, first gone to MRI and then was quite tired upon return with lunch waiting.  Will see if tomorrow can be seen for mobility.  Family were in and trying to feed her.     Ramond Dial 03/09/2021, 2:45 PM   Mee Hives, PT MS Acute Rehab Dept. Number: [place]

## 2021-03-09 NOTE — Progress Notes (Signed)
Per attending Dr. Erlinda Hong, cortrak is in correct placement and okay to use.

## 2021-03-09 NOTE — Progress Notes (Signed)
Subjective:  Patient not verbalizing anything today when I saw her   Antibiotics:  Anti-infectives (From admission, onward)   Start     Dose/Rate Route Frequency Ordered Stop   03/05/21 2000  DAPTOmycin (CUBICIN) 500 mg in sodium chloride 0.9 % IVPB        500 mg 120 mL/hr over 30 Minutes Intravenous Every 48 hours 02/21/2021 1517     02/20/2021 2300  cefTRIAXone (ROCEPHIN) 2 g in sodium chloride 0.9 % 100 mL IVPB        2 g 200 mL/hr over 30 Minutes Intravenous Every 24 hours 02/17/2021 2236     02/19/2021 2000  DAPTOmycin (CUBICIN) 500 mg in sodium chloride 0.9 % IVPB  Status:  Discontinued        8 mg/kg  61.3 kg 120 mL/hr over 30 Minutes Intravenous Every 48 hours 03/02/21 0855 02/25/2021 1320   03/08/2021 1500  DAPTOmycin (CUBICIN) 600 mg in sodium chloride 0.9 % IVPB  Status:  Discontinued        6 mg/kg  99 kg 124 mL/hr over 30 Minutes Intravenous Daily 02/17/2021 1400 02/21/2021 1402   03/01/2021 1500  DAPTOmycin (CUBICIN) 500 mg in sodium chloride 0.9 % IVPB  Status:  Discontinued        500 mg 120 mL/hr over 30 Minutes Intravenous Daily 03/16/2021 1405 03/06/2021 1517   02/28/2021 1045  tobramycin (NEBCIN) powder  Status:  Discontinued          As needed 02/17/2021 1045 02/20/2021 1201   02/23/2021 1037  vancomycin (VANCOCIN) powder  Status:  Discontinued          As needed 03/11/2021 1044 03/13/2021 1201   03/11/2021 0800  vancomycin (VANCOCIN) IVPB 1000 mg/200 mL premix        1,000 mg 200 mL/hr over 60 Minutes Intravenous On call to O.R. 03/02/2021 0752 02/27/2021 1040   03/02/21 2200  cefTRIAXone (ROCEPHIN) injection 2 g  Status:  Discontinued        2 g Intramuscular Every 24 hours 03/02/21 0904 03/09/2021 2235   03/02/21 0600  ceFAZolin (ANCEF) IVPB 2g/100 mL premix  Status:  Discontinued        2 g 200 mL/hr over 30 Minutes Intravenous On call to O.R. 03/02/21 0110 02/25/2021 0559   02/27/21 2200  vancomycin (VANCOREADY) IVPB 750 mg/150 mL  Status:  Discontinued        750 mg 150 mL/hr over  60 Minutes Intravenous Every 48 hours 03/15/2021 2342 03/02/21 0855   02/24/2021 2315  vancomycin (VANCOCIN) IVPB 1000 mg/200 mL premix        1,000 mg 200 mL/hr over 60 Minutes Intravenous  Once 02/24/2021 2313 02/26/21 0115   03/13/2021 2315  ceFEPIme (MAXIPIME) 2 g in sodium chloride 0.9 % 100 mL IVPB  Status:  Discontinued        2 g 200 mL/hr over 30 Minutes Intravenous Every 24 hours 02/24/2021 2313 03/02/21 0904      Medications: Scheduled Meds: . Chlorhexidine Gluconate Cloth  6 each Topical Daily  . cilostazol  100 mg Oral BID  . docusate sodium  100 mg Oral BID  . feeding supplement  237 mL Oral TID BM  . feeding supplement (PROSource TF)  45 mL Per Tube TID  . heparin injection (subcutaneous)  5,000 Units Subcutaneous Q12H  . levothyroxine  100 mcg Oral Q0600  . mouth rinse  15 mL Mouth Rinse BID  . metoprolol tartrate  25  mg Oral BID  . multivitamin with minerals  1 tablet Oral Daily  . nutrition supplement (JUVEN)  1 packet Per Tube BID BM  . pantoprazole (PROTONIX) IV  40 mg Intravenous Q12H  . polyethylene glycol  17 g Oral Daily  . senna-docusate  1 tablet Oral QHS  . sodium bicarbonate  1,300 mg Oral TID  . sodium chloride flush  10-40 mL Intracatheter Q12H  . vitamin B-12  1,000 mcg Oral Daily   Continuous Infusions: . cefTRIAXone (ROCEPHIN)  IV Stopped (03/08/21 2310)  . DAPTOmycin (CUBICIN)  IV Stopped (03/07/21 2125)  . feeding supplement (OSMOLITE 1.5 CAL)     PRN Meds:.acetaminophen, alum & mag hydroxide-simeth, haloperidol lactate, menthol-cetylpyridinium **OR** phenol, metoCLOPramide **OR** metoCLOPramide (REGLAN) injection, metoprolol tartrate, ondansetron **OR** ondansetron (ZOFRAN) IV, sodium chloride flush    Objective: Weight change:   Intake/Output Summary (Last 24 hours) at 03/09/2021 1239 Last data filed at 03/09/2021 0846 Gross per 24 hour  Intake 110 ml  Output 1 ml  Net 109 ml   Blood pressure (!) 154/71, pulse 79, temperature 98 F (36.7 C),  temperature source Axillary, resp. rate 16, height 5' 4.02" (1.626 m), weight 99 kg, SpO2 100 %. Temp:  [98 F (36.7 C)-98.5 F (36.9 C)] 98 F (36.7 C) (04/21 2035) Pulse Rate:  [79] 79 (04/21 2035) BP: (148-154)/(71) 154/71 (04/21 2035) SpO2:  [99 %-100 %] 100 % (04/21 2035)  Physical Exam: Physical Exam Constitutional:      General: She is not in acute distress.    Appearance: She is well-developed. She is ill-appearing. She is not diaphoretic.  HENT:     Head: Normocephalic and atraumatic.     Right Ear: External ear normal.     Left Ear: External ear normal.     Mouth/Throat:     Pharynx: No oropharyngeal exudate.  Eyes:     General: No scleral icterus. Cardiovascular:     Rate and Rhythm: Normal rate and regular rhythm.     Heart sounds: Normal heart sounds. No murmur heard. No friction rub. No gallop.   Pulmonary:     Effort: Pulmonary effort is normal. No respiratory distress.     Breath sounds: Normal breath sounds. No wheezing or rales.  Abdominal:     General: Bowel sounds are normal. There is no distension.     Palpations: Abdomen is soft.     Tenderness: There is no abdominal tenderness. There is no rebound.  Musculoskeletal:        General: No tenderness. Normal range of motion.  Lymphadenopathy:     Cervical: No cervical adenopathy.  Skin:    General: Skin is warm and dry.     Coloration: Skin is pale.     Findings: No erythema or rash.  Neurological:     Mental Status: She is disoriented.     Motor: No abnormal muscle tone.     Coordination: Coordination normal.  Psychiatric:        Speech: She is noncommunicative.        Cognition and Memory: Cognition is impaired. Memory is impaired. She exhibits impaired recent memory.     Knee with dressing in place CBC:    BMET Recent Labs    03/08/21 0500 03/09/21 0430  NA 143 145  K 3.6 3.6  CL 113* 114*  CO2 22 23  GLUCOSE 73 67*  BUN 75* 73*  CREATININE 2.89* 2.74*  CALCIUM 8.9 8.9      Liver Panel  No  results for input(s): PROT, ALBUMIN, AST, ALT, ALKPHOS, BILITOT, BILIDIR, IBILI in the last 72 hours.     Sedimentation Rate No results for input(s): ESRSEDRATE in the last 72 hours. C-Reactive Protein No results for input(s): CRP in the last 72 hours.  Micro Results: Recent Results (from the past 720 hour(s))  Blood culture (routine single)     Status: None   Collection Time: 03/04/2021  8:57 PM   Specimen: Right Antecubital; Blood  Result Value Ref Range Status   Specimen Description RIGHT ANTECUBITAL  Final   Special Requests   Final    BOTTLES DRAWN AEROBIC AND ANAEROBIC Blood Culture adequate volume   Culture   Final    NO GROWTH 5 DAYS Performed at [facility], [address] [place], [place] [address]    Report Status 03/02/2021 FINAL  Final  Resp Panel by RT-PCR (Flu A&B, Covid) Nasopharyngeal Swab     Status: None   Collection Time: 03/11/2021  9:00 PM   Specimen: Nasopharyngeal Swab; Nasopharyngeal(NP) swabs in vial transport medium  Result Value Ref Range Status   SARS Coronavirus 2 by RT PCR NEGATIVE NEGATIVE Final    Comment: (NOTE) SARS-CoV-2 target nucleic acids are NOT DETECTED.  The SARS-CoV-2 RNA is generally detectable in upper respiratory specimens during the acute phase of infection. The lowest concentration of SARS-CoV-2 viral copies this assay can detect is 138 copies/mL. A negative result does not preclude SARS-Cov-2 infection and should not be used as the sole basis for treatment or other patient management decisions. A negative result may occur with  improper specimen collection/handling, submission of specimen other than nasopharyngeal swab, presence of viral mutation(s) within the areas targeted by this assay, and inadequate number of viral copies(<138 copies/mL). A negative result must be combined with clinical observations, patient history, and epidemiological information. The expected result is Negative.  Fact Sheet  for Patients:  EntrepreneurPulse.com.au  Fact Sheet for Healthcare Providers:  IncredibleEmployment.be  This test is no t yet approved or cleared by the [place] FDA and  has been authorized for detection and/or diagnosis of SARS-CoV-2 by FDA under an Emergency Use Authorization (EUA). This EUA will remain  in effect (meaning this test can be used) for the duration of the COVID-19 declaration under Section 564(b)(1) of the Act, 21 U.S.C.section 360bbb-3(b)(1), unless the authorization is terminated  or revoked sooner.       Influenza A by PCR NEGATIVE NEGATIVE Final   Influenza B by PCR NEGATIVE NEGATIVE Final    Comment: (NOTE) The Xpert Xpress SARS-CoV-2/FLU/RSV plus assay is intended as an aid in the diagnosis of influenza from Nasopharyngeal swab specimens and should not be used as a sole basis for treatment. Nasal washings and aspirates are unacceptable for Xpert Xpress SARS-CoV-2/FLU/RSV testing.  Fact Sheet for Patients: EntrepreneurPulse.com.au  Fact Sheet for Healthcare Providers: IncredibleEmployment.be  This test is not yet approved or cleared by the [place] FDA and has been authorized for detection and/or diagnosis of SARS-CoV-2 by FDA under an Emergency Use Authorization (EUA). This EUA will remain in effect (meaning this test can be used) for the duration of the COVID-19 declaration under Section 564(b)(1) of the Act, 21 U.S.C. section 360bbb-3(b)(1), unless the authorization is terminated or revoked.  Performed at [facility], [address], [place], [place] [address]   Urine culture     Status: Abnormal   Collection Time: 02/17/2021 11:57 PM   Specimen: In/Out Cath Urine  Result Value Ref Range Status   Specimen  Description   Final    IN/OUT CATH URINE Performed at [facility], [address], [place], [place] [address]    Special Requests   Final    NONE Performed at  [facility], [address] [place], [place] [address]    Culture 80,000 COLONIES/mL ENTEROCOCCUS AVIUM (A)  Final   Report Status 03/01/2021 FINAL  Final   Organism ID, Bacteria ENTEROCOCCUS AVIUM (A)  Final      Susceptibility   Enterococcus avium - MIC*    AMPICILLIN 16 RESISTANT Resistant     NITROFURANTOIN <=16 SENSITIVE Sensitive     VANCOMYCIN <=0.5 SENSITIVE Sensitive     * 80,000 COLONIES/mL ENTEROCOCCUS AVIUM  Body fluid culture w Gram Stain     Status: None   Collection Time: 03/01/21 11:02 AM   Specimen: Body Fluid  Result Value Ref Range Status   Specimen Description FLUID  Final   Special Requests SYNOVIAL LEFT KNEE  Final   Gram Stain   Final    ABUNDANT WBC PRESENT, PREDOMINANTLY MONONUCLEAR ABUNDANT GRAM POSITIVE COCCI IN PAIRS IN CHAINS    Culture   Final    NO GROWTH 3 DAYS Performed at [facility], [place] [address] [place], [place] [address]    Report Status 03/05/2021 FINAL  Final    Studies/Results: No results found.    Assessment/Plan:  INTERVAL HISTORY:   MRI of sacrum still not read   Patient refusing food now.  Principal Problem:   Severe Sepsis due to left leg cellulitis and possible Lt septic knee and Enterococcus UTI Active Problems:   Essential hypertension   PERIPHERAL VASCULAR DISEASE   GERD   Hypothyroidism   AKI (acute kidney injury) ([place])   Pressure injury of skin   Generalized weakness   Acute metabolic encephalopathy   Leukocytosis   Hyponatremia   Dehydration   Hypoalbuminemia   Elevated brain natriuretic peptide (BNP) level   Paroxysmal A-fib (HCC)   Altered mental status   Septic infrapatellar bursitis of left knee   Infection of total knee replacement (HCC)   Medication monitoring encounter    [name] is a 87 y.o. female with history of recent distal femur fracture status post still femoral replacement discharged to skilled nursing facility but readmitted now with periprosthetic joint infection as  well as to stage IV decubitus ulcers one in her sacral area and 1 in her ankle.  Blood cultures were taken on admission and have not yielded any organisms.  She was taken to the operating room  while on vancomycin and ceftriaxone.  Cultures did not yield any organisms though the gram stain showed gram-positive cocci in pairs and chains  She has acute on chronic renal insufficiency (latter is obvious from her prior elevated creatinines  She continues to be encephalopathic  #1 Goals of care: Greatly appreciate palliative care seeing her.  Not seem that the family have very realistic expectations and I really think all of the aggressive care the patient is receiving is not helping to improve her experience of life now or for the foreseeable future  #2  Prosthetic knee infection: Continue daptomycin and ceftriaxone for now but I do not see what we are going to really accomplish here in context of her incurable decubitus ulcers, poor nutritional status and dementia   #3  Deep stage IV decubitus ulcer of sacral area and foot:  MRI of the ankle shows osteomyelitis.  To treat this she would need amputation below the  knee.  Think she is healthy to undergo such a surgery  MRI of the sacral area what undoubtedly show osteomyelitis as well.  The fact that she developed these decubitus ulcer should be clear-cut evidence for [place] that the patient's trajectory has been downward for some time and is not going to be reversible.   #4 acute on chronic renal failure: Dr Justin Mend is following. Patient not making much urine.She has largely been off vancomycin since I first saw her though I do believe her cement from surgery contains vancomycin and tobramycin and these drugs could have made an impact on her renal function  Will continue to hope that family will eventually make the compassionate decision to change to comfort care.  I am going off service today and I am going to take her off of our rounding list as  I do not see her making progress towards DC anytime soon.  When and IF she gets to a place where she might DC on IV antibiotics (wihich is not what I recommend to be clear.  My recommendation is for her to go to pure comfort care.)  Then please page [place] back and we can arrange for hospital follow-up for her.             LOS: 12 days   Alcide Evener 03/09/2021, 12:39 PM

## 2021-03-09 NOTE — Progress Notes (Signed)
Pt remains more lethargic this evening and uninterested in any po intake. Pt spits medication and applesauce out when attempting to give it to her. Pt states, "i'm not eating that." Will continue to monitor pt.

## 2021-03-09 NOTE — Progress Notes (Addendum)
PROGRESS NOTE    [name]  ZJQ:[id number] DOB: 06-Jul-1929 DOA: Mar 05, 2021 PCP: Rosita Fire, MD   Chief Complaint  Patient presents with  . Weakness  Brief Narrative: 87 year old female with a PMH of Chronic Dementia, Peripheral Vascular Disease, and multiple medical problems presented to [facility] ED on 4/11 with confusion, weakness, mild AKI (Creatinine 2.53 with baseline ~ 1 mg/dL) and acute swelling and redness of the left knee consistent with septic joint.    On 2/27 - patient sustained a periprosthetic femur fracture s/p mechanical fall and underwent a Left Femur Replacement performed by Dr. Lyla Glassing.     On 4/14 - Left Knee Arthrocentesis performed by Ortho.  WBC was 100K.  Cultures are growing GPCs.  On 4/16 - I&D, Left Total Knee Arthroplasty with Radical Synovectomy performed by Ortho Dr. Lyla Glassing.  Intraoperative cultures sent.  On 4/18 - Hb was 5.5 g/dL, received 1 unit pRBC  4/14 Blood Culture: NG 4/14 Urine Culture: Enterococcus Avium  Subjective:   Core track tube feeding placement today No documentation about  last 24 hours urine output  Patient is drowsy and weak, but following commands, states she is at [facility]. She is not oriented to time She denies pain this am  currently no agitation She is on 2liter oxygen supplement, though I do not know when she was put on oxygen and for how long, no respiratory distress, no hypoxia on 2liter o2, no cough  bm x1 documented last 24hrs  Wound vac on, left knee immobilizer on     Assessment & Plan:  Acute Anemia - postop blood loss in the setting of AOCKD:  -Hemoglobin nadir at 5.6 on 4/18 -So far has received total 4 PRBC since 4/16 -Currently on Pletal and heparin subcu for DVT prophylaxis,  - GI consulted , no overt GI bleed , no melena or hematochezia , GI signed off on 4/1 9  - she has been on IV Protonix 40 mg BID since 4/18, no bm documented for several days , less likely gi blood loss,  will check FOBT,  change to ppi daily   Left Knee Prosthetic Joint Infection s/p 4/16 Resection Arthroplasty and Radical Synovectomy: - 4/14 aspiration cultures showed GPCs in pairs and in chains, fluids culture no growth - - On  Daptomycin 6 mg/kg QD and Ceftriaxone 2g QD currently, Patient will need at least 6 weeks of IV therapy followed by oral drug therapy based on susceptibilities plus Rifampin 300-450 mg PO BID.  Infectious Disease is following, appreciate. - Monitor LFTs and CK weekly while on Daptomycin. - On 4/17, PICC line was placed.   -ID recommend palliative care/comfort measures , detail please see ID note from 4/22.   Left Lower Extremity Non-Purulent Cellulitis / Osteomyelitis with h/o PVD: - MRI of ankle shows findings of early osteomyelitis. -Talk to daughter [name] who state do not wish to pursue surgical treatment - - Infectious Disease is following, appreciate.   Stage 4 Sacral Ulcer, POA: - MRI of sacrum is pending. - Continue antibiotics as above.   Enterococcus Avium UTI: 4/14 Urine cultures are resistant to Ampicillin and sensitive to Vancomycin. - Completed 7 days of Vancomycin.  Leukocytosis: -WBC appear peak at 21.4 ,WBC has decreased to 11-12K.   - Monitor daily.  Acute Kidney Injury with mild metabolic acidosis: - Creatinine increased from 2 to 3 mg/dL over the last few days.  Baseline ~ 1.1 mg/dL. - - treated with  IV Bumex to 2 mg  BID, discontinued on 4/21 per nephrology recommendation - 4/18 Abdominal CT shows no obstructive uropathy and no renal mass that was reported on ultrasound. - will follow nephrology recommendation  Acute Toxic Metabolic Encephalopathy in the setting of Chronic Dementia - this is due to acute infection in the setting of worsening dementia: - Mental status waxes and wanes, slightly improvement on 4/21 and 4/22    Paroxysmal Atrial Fibrillation: - Continue Metoprolol for rate control. - Hold anticoagulation due to high  bleeding risk. - Monitor on telemetry.  Grade 1 Diastolic Heart Failure: [id number] Echo showed Grade 1 DD and normal EF. - Echo also showed small LV cavity with hyperdynamic function.  Continue beta blocker  Physical Deconditioning: - Since family does not want  comfort care, may need  SNF [place] once able to discharge.  Severe Protein Calorie Malnutrition: - Albumin is 1.4 mmol/L. -started tube feeds on 4/22  Neurological Oropharyngeal Dysphagia: -Family agreed to core track tube feeds, order placed  Chronic Anemia - B12 and IDA: HH is stable. Essential Hypertension: BP is stable. Hold off on meds. GERD: PPI Hypothyroidism: Synthroid 2010 Cecal Mass s/p right hemicolectomy  family are still processing their mother's terminal disease, and would like to continue care and full code status.  Diet Order            DIET - DYS 1 Room service appropriate? No; Fluid consistency: Thin  Diet effective now                 Nutrition Problem: Increased nutrient needs Etiology: wound healing (3 stage 4 PI and a DTI to left heel) Signs/Symptoms: estimated needs Interventions: Refer to RD note for recommendations,Tube feeding,Prostat,Calorie Count Patient's Body mass index is 37.44 kg/m.  Pressure Injury 03/10/2021 Sacrum Mid Stage 4 - Full thickness tissue loss with exposed bone, tendon or muscle. tunneling (Active)  03/09/2021 2105  Location: Sacrum  Location Orientation: Mid  Staging: Stage 4 - Full thickness tissue loss with exposed bone, tendon or muscle.  Wound Description (Comments): tunneling  Present on Admission: Yes     Pressure Injury 03/07/2021 Sacrum Mid Stage 4 - Full thickness tissue loss with exposed bone, tendon or muscle. tunneling (Active)  03/09/2021 2106  Location: Sacrum  Location Orientation: Mid  Staging: Stage 4 - Full thickness tissue loss with exposed bone, tendon or muscle.  Wound Description (Comments): tunneling  Present on Admission: Yes     Pressure Injury  02/26/21 Ankle Right;Lateral Stage 4 - Full thickness tissue loss with exposed bone, tendon or muscle. (Active)  02/26/21 0202  Location: Ankle  Location Orientation: Right;Lateral  Staging: Stage 4 - Full thickness tissue loss with exposed bone, tendon or muscle.  Wound Description (Comments):   Present on Admission:      Pressure Injury 02/26/21 Foot Left;Lateral Deep Tissue Pressure Injury - Purple or maroon localized area of discolored intact skin or blood-filled blister due to damage of underlying soft tissue from pressure and/or shear. (Active)  02/26/21 0158  Location: Foot  Location Orientation: Left;Lateral  Staging: Deep Tissue Pressure Injury - Purple or maroon localized area of discolored intact skin or blood-filled blister due to damage of underlying soft tissue from pressure and/or shear.  Wound Description (Comments):   Present on Admission: Yes    DVT prophylaxis: heparin injection 5,000 Units Start: 03/07/21 1000 SCDs Start: 02/24/2021 1405 Code Status:   Code Status: Full Code  Family Communication:  I talked to daughter [name] over the phone on 4/22, I recommended  full comfort measures due to ongoing infection, osteomyelitis right ankle, sacral MRIs pending, but likely will show osteomyelitis as well, daughter [name] states no surgery for right ankle, she does not want her mother going through amputation, daughter is not ready to transition for comfort measures, she is hopefull that patient will improve , will need to continue goals of care conversation, palliative care following as well  Status is: Inpatient Remains inpatient appropriate because:IV treatments appropriate due to intensity of illness or inability to take PO and Inpatient level of care appropriate due to severity of illness  Dispo: The patient is from: Home              Anticipated d/c is to: TBD SNF vs LTAC vs  Hospice              Patient currently is not medically stable to d/c.   Difficult to place  patient No Unresulted Labs (From admission, onward)          Start     Ordered   03/12/21 0500  CK  Every Monday (0500),   R     Question:  Specimen collection method  Answer:  IV Team=IV Team collect   03/06/21 0829   03/10/21 0500  Creatinine, serum  (enoxaparin (LOVENOX)  CrCl >/= 30 mL/min  )  Weekly,   R     Comments: while on enoxaparin therapy.   Question:  Specimen collection method  Answer:  Lab=Lab collect   02/24/2021 1404   03/09/21 [id number]  Basic metabolic panel  Daily,   R     Question:  Specimen collection method  Answer:  Unit=Unit collect   03/08/21 1728   03/09/21 0500  Magnesium  (ICU Tube Feeding: PEPuP )  5A & 5P,   R (with TIMED occurrences)     Question:  Specimen collection method  Answer:  Unit=Unit collect   03/08/21 2343   03/09/21 0500  Phosphorus  (ICU Tube Feeding: PEPuP )  5A & 5P,   R (with TIMED occurrences)     Question:  Specimen collection method  Answer:  Unit=Unit collect   03/08/21 2343   03/04/21 0500  CBC with Differential/Platelet  Daily,   R     Question:  Specimen collection method  Answer:  Lab=Lab collect   03/04/2021 1322   Unscheduled  Occult blood card to lab, stool  As needed,   R      03/05/21 1409   Unscheduled  Occult blood card to lab, stool  As needed,   R      03/08/21 1711        Medications reviewed:  Scheduled Meds: . Chlorhexidine Gluconate Cloth  6 each Topical Daily  . cilostazol  100 mg Oral BID  . docusate sodium  100 mg Oral BID  . feeding supplement  237 mL Oral TID BM  . feeding supplement (PROSource TF)  45 mL Per Tube TID  . heparin injection (subcutaneous)  5,000 Units Subcutaneous Q12H  . levothyroxine  100 mcg Oral Q0600  . mouth rinse  15 mL Mouth Rinse BID  . megestrol  400 mg Oral Daily  . metoprolol tartrate  25 mg Oral BID  . mirtazapine  7.5 mg Oral QHS  . multivitamin with minerals  1 tablet Oral Daily  . nutrition supplement (JUVEN)  1 packet Per Tube BID BM  . pantoprazole (PROTONIX) IV  40 mg  Intravenous Q12H  . polyethylene glycol  17 g Oral  Daily  . senna-docusate  1 tablet Oral QHS  . sodium bicarbonate  1,300 mg Oral TID  . sodium chloride flush  10-40 mL Intracatheter Q12H  . vitamin B-12  1,000 mcg Oral Daily   Continuous Infusions: . cefTRIAXone (ROCEPHIN)  IV Stopped (03/08/21 2310)  . DAPTOmycin (CUBICIN)  IV Stopped (03/07/21 2125)  . feeding supplement (OSMOLITE 1.5 CAL)      Consultants:see note , ID, palliative care, nephrology, GI, Ortho Procedures:see note  Antimicrobials: Anti-infectives (From admission, onward)   Start     Dose/Rate Route Frequency Ordered Stop   03/05/21 2000  DAPTOmycin (CUBICIN) 500 mg in sodium chloride 0.9 % IVPB        500 mg 120 mL/hr over 30 Minutes Intravenous Every 48 hours 02/16/2021 1517     03/12/2021 2300  cefTRIAXone (ROCEPHIN) 2 g in sodium chloride 0.9 % 100 mL IVPB        2 g 200 mL/hr over 30 Minutes Intravenous Every 24 hours 03/04/2021 2236     03/11/2021 2000  DAPTOmycin (CUBICIN) 500 mg in sodium chloride 0.9 % IVPB  Status:  Discontinued        8 mg/kg  61.3 kg 120 mL/hr over 30 Minutes Intravenous Every 48 hours 03/02/21 0855 03/07/2021 1320   03/05/2021 1500  DAPTOmycin (CUBICIN) 600 mg in sodium chloride 0.9 % IVPB  Status:  Discontinued        6 mg/kg  99 kg 124 mL/hr over 30 Minutes Intravenous Daily 03/07/2021 1400 02/18/2021 1402   03/06/2021 1500  DAPTOmycin (CUBICIN) 500 mg in sodium chloride 0.9 % IVPB  Status:  Discontinued        500 mg 120 mL/hr over 30 Minutes Intravenous Daily 02/22/2021 1405 02/22/2021 1517   03/16/2021 1045  tobramycin (NEBCIN) powder  Status:  Discontinued          As needed 03/02/2021 1045 02/17/2021 1201   03/04/2021 1037  vancomycin (VANCOCIN) powder  Status:  Discontinued          As needed 03/01/2021 1044 03/02/2021 1201   02/21/2021 0800  vancomycin (VANCOCIN) IVPB 1000 mg/200 mL premix        1,000 mg 200 mL/hr over 60 Minutes Intravenous On call to O.R. 02/21/2021 0752 02/25/2021 1040   03/02/21 2200   cefTRIAXone (ROCEPHIN) injection 2 g  Status:  Discontinued        2 g Intramuscular Every 24 hours 03/02/21 0904 02/26/2021 2235   03/02/21 0600  ceFAZolin (ANCEF) IVPB 2g/100 mL premix  Status:  Discontinued        2 g 200 mL/hr over 30 Minutes Intravenous On call to O.R. 03/02/21 0110 02/26/2021 0559   02/27/21 2200  vancomycin (VANCOREADY) IVPB 750 mg/150 mL  Status:  Discontinued        750 mg 150 mL/hr over 60 Minutes Intravenous Every 48 hours 03/03/2021 2342 03/02/21 0855   02/17/2021 2315  vancomycin (VANCOCIN) IVPB 1000 mg/200 mL premix        1,000 mg 200 mL/hr over 60 Minutes Intravenous  Once 03/06/2021 2313 02/26/21 0115   03/16/2021 2315  ceFEPIme (MAXIPIME) 2 g in sodium chloride 0.9 % 100 mL IVPB  Status:  Discontinued        2 g 200 mL/hr over 30 Minutes Intravenous Every 24 hours 02/20/2021 2313 03/02/21 0904     Culture/Microbiology    Component Value Date/Time   SDES FLUID 03/01/2021 1102   SPECREQUEST SYNOVIAL LEFT KNEE 03/01/2021 1102   CULT  03/01/2021 1102    NO GROWTH 3 DAYS Performed at [facility], [place] [address], [place], [place] [address]    REPTSTATUS 03/05/2021 FINAL 03/01/2021 1102    Other culture-see note  Objective: Vitals: Today's Vitals   03/08/21 0620 03/08/21 0753 03/08/21 1840 03/08/21 2035  BP: (!) 141/68 124/61 (!) 148/71 (!) 154/71  Pulse: 74 72  79  Resp: 16     Temp: 97.6 F (36.4 C)  98.5 F (36.9 C) 98 F (36.7 C)  TempSrc:   Axillary Axillary  SpO2: 100%  99% 100%  Weight:      Height:      PainSc:        Intake/Output Summary (Last 24 hours) at 03/09/2021 0818 Last data filed at 03/09/2021 0645 Gross per 24 hour  Intake 100 ml  Output 1 ml  Net 99 ml   Filed Weights   02/26/21 0148 02/28/21 0500 03/02/21 1024  Weight: 61.2 kg 61.3 kg 99 kg   Weight change:   Intake/Output from previous day: 04/21 0701 - 04/22 0700 In: 100 [IV Piggyback:100] Out: 1 [Stool:1] Intake/Output this shift: No intake/output data  recorded. Filed Weights   02/26/21 0148 02/28/21 0500 03/02/21 1024  Weight: 61.2 kg 61.3 kg 99 kg    Examination: General exam: AAOx1-2, ill looking, elderly, frail and weak.  PICC line to right arm,+  indwelling Foley catheter, she is on 2liter oxygen HEENT:Oral mucosa moist, Ear/Nose WNL grossly, dentition normal. Respiratory system: bilaterally diminished,no wheezing or crackles,no use of accessory muscle Cardiovascular system: S1 & S2 +, No JVD, no significant murmurs Gastrointestinal system: Abdomen soft, NT,ND, BS+ Nervous System:Alert, awake, moving extremities and grossly nonfocal Extremities: No edema, distal peripheral pulses palpable.  Left knee area is tender left lower extremity swollen, positive wound VAC,  Skin: sacral lesions noted MSK: physical deconditioning  Data Reviewed: I have personally reviewed following labs and imaging studies CBC: Recent Labs  Lab 03/05/21 0323 03/05/21 1126 03/06/21 0305 03/06/21 1010 03/06/21 1800 03/07/21 0355 03/07/21 1830 03/08/21 0500 03/09/21 0430  WBC 17.6* 15.6* 13.5*  --   --  13.6*  --  11.2* 12.2*  NEUTROABS 14.5*  --  10.9*  --   --  11.2*  --  9.6* 10.5*  HGB 5.6* 7.0* 6.9*   < > 8.4* 8.5* 8.9* 8.7* 8.8*  HCT 17.5* 21.4* 21.6*   < > 25.3* 26.5* 27.5* 27.2* 27.8*  MCV 93.6 88.4 87.8  --   --  88.3  --  88.3 89.7  PLT 182 187 197  --   --  258  --  309 333   < > = values in this interval not displayed.   Basic Metabolic Panel: Recent Labs  Lab 03/04/21 0740 03/05/21 0323 03/06/21 0305 03/07/21 0355 03/07/21 1830 03/08/21 0500 03/09/21 0430  NA 142   < > 139 142 141 143 145  K 4.6   < > 4.5 4.3 4.0 3.6 3.6  CL 115*   < > 112* 114* 112* 113* 114*  CO2 20*   < > 23 19* 21* 22 23  GLUCOSE 124*   < > 84 75 96 73 67*  BUN 82*   < > 84* 84* 80* 75* 73*  CREATININE 2.48*   < > 3.06* 2.94* 2.91* 2.89* 2.74*  CALCIUM 8.2*   < > 8.3* 8.7* 8.8* 8.9 8.9  MG  --   --   --   --   --   --  2.0  PHOS 3.5  --   --   --    --   --  4.5   < > = values in this interval not displayed.   GFR: Estimated Creatinine Clearance: 15.9 mL/min (A) (by C-G formula based on SCr of 2.74 mg/dL (H)). Liver Function Tests: Recent Labs  Lab 03/02/2021 0103 03/04/21 0519 03/04/21 0740  AST 24 32  --   ALT 13 9  --   ALKPHOS 56 48  --   BILITOT 0.4 0.8  --   PROT 5.8* 5.0*  --   ALBUMIN 1.4* 1.3* 1.4*   Coagulation Profile: No results for input(s): INR, PROTIME in the last 168 hours. Cardiac Enzymes: Recent Labs  Lab 02/21/2021 0103 03/04/21 0519 03/06/21 0305  CKTOTAL 69 97 87   CBG: Recent Labs  Lab 03/09/21 0808  GLUCAP 64*   Sepsis Labs: Recent Labs  Lab 03/04/21 1715  LATICACIDVEN 1.2    Recent Results (from the past 240 hour(s))  Body fluid culture w Gram Stain     Status: None   Collection Time: 03/01/21 11:02 AM   Specimen: Body Fluid  Result Value Ref Range Status   Specimen Description FLUID  Final   Special Requests SYNOVIAL LEFT KNEE  Final   Gram Stain   Final    ABUNDANT WBC PRESENT, PREDOMINANTLY MONONUCLEAR ABUNDANT GRAM POSITIVE COCCI IN PAIRS IN CHAINS    Culture   Final    NO GROWTH 3 DAYS Performed at [facility], [place] [address] [place], [place] [address]    Report Status 03/05/2021 FINAL  Final     Radiology Studies: MR ANKLE RIGHT WO CONTRAST  Result Date: 03/07/2021 CLINICAL DATA:  Right lateral ankle wound. Evaluate for osteomyelitis. EXAM: MRI OF THE RIGHT ANKLE WITHOUT CONTRAST TECHNIQUE: Multiplanar, multisequence MR imaging of the ankle was performed. No intravenous contrast was administered. COMPARISON:  Right foot x-rays dated January 10, 2021. Right ankle x-rays dated January 08, 2021. FINDINGS: TENDONS Peroneal: Peroneal longus tendon intact. Peroneal brevis intact. Posteromedial: Posterior tibial tendon intact. Flexor digitorum longus tendon intact. Flexor hallucis longus tendon intact. Anterior: Tibialis anterior tendon intact. Extensor hallucis longus  tendon intact Extensor digitorum longus tendon intact. Achilles:  Intact. Plantar Fascia: Intact. Mild thickening of the proximal central band. LIGAMENTS Lateral: Anterior talofibular ligament intact. Calcaneofibular ligament intact. Posterior talofibular ligament intact. Anterior and posterior tibiofibular ligaments intact. Medial: Deltoid ligament intact. Spring ligament intact. CARTILAGE Ankle Joint: No significant joint effusion. Normal ankle mortise. No chondral defect. Subtalar Joints/Sinus Tarsi: Normal subtalar joints. No subtalar joint effusion. Normal sinus tarsi. Bones: Mild marrow edema within the lateral malleolus. T1 marrow signal is mostly preserved. No acute fracture or dislocation. No suspicious bone lesion. Mild navicular-medial cuneiform osteoarthritis. Soft Tissue: Near circumferential lower leg and hindfoot soft tissue swelling extending into the dorsolateral forefoot, sparing the medial aspect. Soft tissue ulceration over the lateral malleolus. No fluid collection. IMPRESSION: 1. Soft tissue ulceration over the lateral malleolus with underlying mild marrow edema, suspicious for early osteomyelitis. No abscess. Electronically Signed   By: Titus Dubin M.D.   On: 03/07/2021 12:13     LOS: 12 days   Florencia Reasons, MD PhD FACP [facility]  03/09/2021, 8:18 AM

## 2021-03-09 NOTE — Progress Notes (Signed)
[name]   Subjective:   Brief history: This is an 87 year old lady with a history of hyperlipidemia peripheral vascular disease atrial fibrillation hypertension hypothyroidism who was admitted with a prosthetic joint infection.  She was treated with vancomycin.  Baseline creatinine is 0.8 to 1 mg/dL.  She has developed acute kidney injury with a creatinine 2.87 mg/dL 03/05/2021.  She has had an improvement in urine output 2.2 L 03/07/2021    Blood pressure 124/61 pulse 77 temperature 97.6 O2 sats 90% 2 L nasal cannula.  Current medications: Cilostazol 100 mg twice daily, levothyroxine 100 mcg daily, metoprolol 25 mg   twice daily, Protonix 40 mg every 12 hours, sodium bicarbonate 1.3 g twice daily.  Megace 400 mg daily.  Remeron 15 mg daily IV Rocephin 2 g every 24 hours, daptomycin 500 mg every 48 hours  Urine output 1 L 03/08/2021  Sodium 145 potassium 3.6 chloride 114 CO2 23 BUN 73 creatinine 2.74 glucose 67 calcium 8.9 hemoglobin 8.8  Urinalysis cloudy.  Patient is on antibiotic therapy at this time.  We will send urine culture.  CT scan did not reveal any evidence of solid mass.   .  She is progressively oliguric and decisions will need to be made regarding appropriateness of initiating hemodialysis.  Objective:  Vital signs in last 24 hours:  Temp:  [98 F (36.7 C)-98.5 F (36.9 C)] 98 F (36.7 C) (04/21 2035) Pulse Rate:  [79] 79 (04/21 2035) BP: (148-154)/(71) 154/71 (04/21 2035) SpO2:  [99 %-100 %] 100 % (04/21 2035)  Weight change:  Filed Weights   02/26/21 0148 02/28/21 0500 03/02/21 1024  Weight: 61.2 kg 61.3 kg 99 kg    Intake/Output: I/O last 3 completed shifts: In: 518.5 [IV Piggyback:518.5] Out: 2621 [Urine:2600; Drains:20; Stool:1]   Intake/Output this shift:  Total I/O In: 10 [P.O.:10] Out: -   General: Chronically ill-appearing, lying in bed, no distress HEENT: anicteric sclera, oropharynx clear without lesions CV: Normal  rate, no peripheral edema Lungs: clear to auscultation bilaterally, normal work of breathing Abd: soft, non-tender, non-distended Skin: no visible lesions or rashes Psych: Awake and alert with appropriate mood Musculoskeletal: Left leg wrapped in Ace bandage, otherwise no significant deformities Neuro: normal speech, oriented to person but not place time or situation  Basic Metabolic Panel: Recent Labs  Lab 03/04/21 0740 03/05/21 0323 03/06/21 0305 03/07/21 0355 03/07/21 1830 03/08/21 0500 03/09/21 0430  NA 142   < > 139 142 141 143 145  K 4.6   < > 4.5 4.3 4.0 3.6 3.6  CL 115*   < > 112* 114* 112* 113* 114*  CO2 20*   < > 23 19* 21* 22 23  GLUCOSE 124*   < > 84 75 96 73 67*  BUN 82*   < > 84* 84* 80* 75* 73*  CREATININE 2.48*   < > 3.06* 2.94* 2.91* 2.89* 2.74*  CALCIUM 8.2*   < > 8.3* 8.7* 8.8* 8.9 8.9  MG  --   --   --   --   --   --  2.0  PHOS 3.5  --   --   --   --   --  4.5   < > = values in this interval not displayed.    Liver Function Tests: Recent Labs  Lab 03/17/2021 0103 03/04/21 0519 03/04/21 0740  AST 24 32  --   ALT 13 9  --   ALKPHOS 56 48  --  BILITOT 0.4 0.8  --   PROT 5.8* 5.0*  --   ALBUMIN 1.4* 1.3* 1.4*   No results for input(s): LIPASE, AMYLASE in the last 168 hours. No results for input(s): AMMONIA in the last 168 hours.  CBC: Recent Labs  Lab 03/05/21 0323 03/05/21 1126 03/06/21 0305 03/06/21 1010 03/06/21 1800 03/07/21 0355 03/07/21 1830 03/08/21 0500 03/09/21 0430  WBC 17.6* 15.6* 13.5*  --   --  13.6*  --  11.2* 12.2*  NEUTROABS 14.5*  --  10.9*  --   --  11.2*  --  9.6* 10.5*  HGB 5.6* 7.0* 6.9*   < > 8.4* 8.5* 8.9* 8.7* 8.8*  HCT 17.5* 21.4* 21.6*   < > 25.3* 26.5* 27.5* 27.2* 27.8*  MCV 93.6 88.4 87.8  --   --  88.3  --  88.3 89.7  PLT 182 187 197  --   --  258  --  309 333   < > = values in this interval not displayed.    Cardiac Enzymes: Recent Labs  Lab 2021/03/30 0103 03/04/21 0519 03/06/21 0305  CKTOTAL 69 97  87    BNP: Invalid input(s): POCBNP  CBG: Recent Labs  Lab 03/09/21 0808 03/09/21 0947  GLUCAP 64* 111*    Microbiology: Results for orders placed or performed during the hospital encounter of 03/12/2021  Blood culture (routine single)     Status: None   Collection Time: 02/24/2021  8:57 PM   Specimen: Right Antecubital; Blood  Result Value Ref Range Status   Specimen Description RIGHT ANTECUBITAL  Final   Special Requests   Final    BOTTLES DRAWN AEROBIC AND ANAEROBIC Blood Culture adequate volume   Culture   Final    NO GROWTH 5 DAYS Performed at [facility], [address] [place], [place] [address]    Report Status 03/02/2021 FINAL  Final  Resp Panel by RT-PCR (Flu A&B, Covid) Nasopharyngeal Swab     Status: None   Collection Time: 03/10/2021  9:00 PM   Specimen: Nasopharyngeal Swab; Nasopharyngeal(NP) swabs in vial transport medium  Result Value Ref Range Status   SARS Coronavirus 2 by RT PCR NEGATIVE NEGATIVE Final    Comment: (Poole) SARS-CoV-2 target nucleic acids are NOT DETECTED.  The SARS-CoV-2 RNA is generally detectable in upper respiratory specimens during the acute phase of infection. The lowest concentration of SARS-CoV-2 viral copies this assay can detect is 138 copies/mL. A negative result does not preclude SARS-Cov-2 infection and should not be used as the sole basis for treatment or other patient management decisions. A negative result may occur with  improper specimen collection/handling, submission of specimen other than nasopharyngeal swab, presence of viral mutation(s) within the areas targeted by this assay, and inadequate number of viral copies(<138 copies/mL). A negative result must be combined with clinical observations, patient history, and epidemiological information. The expected result is Negative.  Fact Sheet for Patients:  BloggerCourse.com  Fact Sheet for Healthcare Providers:   SeriousBroker.it  This test is no t yet approved or cleared by the [place] FDA and  has been authorized for detection and/or diagnosis of SARS-CoV-2 by FDA under an Emergency Use Authorization (EUA). This EUA will remain  in effect (meaning this test can be used) for the duration of the COVID-19 declaration under Section 564(b)(1) of the Act, 21 U.S.C.section 360bbb-3(b)(1), unless the authorization is terminated  or revoked sooner.       Influenza A by PCR NEGATIVE NEGATIVE Final   Influenza  B by PCR NEGATIVE NEGATIVE Final    Comment: (Poole) The Xpert Xpress SARS-CoV-2/FLU/RSV plus assay is intended as an aid in the diagnosis of influenza from Nasopharyngeal swab specimens and should not be used as a sole basis for treatment. Nasal washings and aspirates are unacceptable for Xpert Xpress SARS-CoV-2/FLU/RSV testing.  Fact Sheet for Patients: BloggerCourse.com  Fact Sheet for Healthcare Providers: SeriousBroker.it  This test is not yet approved or cleared by the [place] FDA and has been authorized for detection and/or diagnosis of SARS-CoV-2 by FDA under an Emergency Use Authorization (EUA). This EUA will remain in effect (meaning this test can be used) for the duration of the COVID-19 declaration under Section 564(b)(1) of the Act, 21 U.S.C. section 360bbb-3(b)(1), unless the authorization is terminated or revoked.  Performed at [facility], [address], [place], [place] [address]   Urine culture     Status: Abnormal   Collection Time: 03-08-21 11:57 PM   Specimen: In/Out Cath Urine  Result Value Ref Range Status   Specimen Description   Final    IN/OUT CATH URINE Performed at [facility], [address], [place], [place] [address]    Special Requests   Final    NONE Performed at [facility], [address] [place], [place] [address]    Culture 80,000 COLONIES/mL ENTEROCOCCUS  AVIUM (A)  Final   Report Status 03/01/2021 FINAL  Final   Organism ID, Bacteria ENTEROCOCCUS AVIUM (A)  Final      Susceptibility   Enterococcus avium - MIC*    AMPICILLIN 16 RESISTANT Resistant     NITROFURANTOIN <=16 SENSITIVE Sensitive     VANCOMYCIN <=0.5 SENSITIVE Sensitive     * 80,000 COLONIES/mL ENTEROCOCCUS AVIUM  Body fluid culture w Gram Stain     Status: None   Collection Time: 03/01/21 11:02 AM   Specimen: Body Fluid  Result Value Ref Range Status   Specimen Description FLUID  Final   Special Requests SYNOVIAL LEFT KNEE  Final   Gram Stain   Final    ABUNDANT WBC PRESENT, PREDOMINANTLY MONONUCLEAR ABUNDANT GRAM POSITIVE COCCI IN PAIRS IN CHAINS    Culture   Final    NO GROWTH 3 DAYS Performed at [facility], [address], [place], [place] [address]    Report Status 03/05/2021 FINAL  Final    Coagulation Studies: No results for input(s): LABPROT, INR in the last 72 hours.  Urinalysis: No results for input(s): COLORURINE, LABSPEC, PHURINE, GLUCOSEU, HGBUR, BILIRUBINUR, KETONESUR, PROTEINUR, UROBILINOGEN, NITRITE, LEUKOCYTESUR in the last 72 hours.  Invalid input(s): APPERANCEUR    Imaging: No results found.   Medications:   . cefTRIAXone (ROCEPHIN)  IV Stopped (03/08/21 2310)  . DAPTOmycin (CUBICIN)  IV Stopped (03/07/21 2125)  . feeding supplement (OSMOLITE 1.5 CAL)     . Chlorhexidine Gluconate Cloth  6 each Topical Daily  . cilostazol  100 mg Oral BID  . docusate sodium  100 mg Oral BID  . feeding supplement  237 mL Oral TID BM  . feeding supplement (PROSource TF)  45 mL Per Tube TID  . heparin injection (subcutaneous)  5,000 Units Subcutaneous Q12H  . levothyroxine  100 mcg Oral Q0600  . mouth rinse  15 mL Mouth Rinse BID  . metoprolol tartrate  25 mg Oral BID  . multivitamin with minerals  1 tablet Oral Daily  . nutrition supplement (JUVEN)  1 packet Per Tube BID BM  . pantoprazole (PROTONIX) IV  40 mg Intravenous  Q12H  . polyethylene  glycol  17 g Oral Daily  . senna-docusate  1 tablet Oral QHS  . sodium bicarbonate  1,300 mg Oral TID  . sodium chloride flush  10-40 mL Intracatheter Q12H  . vitamin B-12  1,000 mcg Oral Daily   acetaminophen, alum & mag hydroxide-simeth, haloperidol lactate, menthol-cetylpyridinium **OR** phenol, metoCLOPramide **OR** metoCLOPramide (REGLAN) injection, metoprolol tartrate, ondansetron **OR** ondansetron (ZOFRAN) IV, sodium chloride flush  Assessment/ Plan:  Oliguric AKI: BL 0.8-1 but higher outpatient recently and unclear why. AKI here initially improved now worse over past 24 hours w/ low UOP. 5L positive but possibly outs not well documented. She does not appear overloaded and may be slightly dry. AKI likely related to acute illness with possible tubular injury 2/2 sepsis w/ borderline hypotension and ongoing tachycardia. Vanc could be contributing -Underwent administration of 5% albumin 03/05/2021.  Hypoalbuminemia noted on laboratory evaluation -Continue hydration PRN -Urinalysis 100 mg/dL protein.  No protein noted 03/09/2021.  WBCs greater than 50.  RBCs 11-20.  This shows some change from urinalysis of 02/17/2021.  Random vancomycin level of 35 -Continue to monitor daily Cr, Dose meds for GFR -Monitor Daily I/Os, Daily weight  -Maintain MAP>65 for optimal renal perfusion.  -Avoid nephrotoxic medications including NSAIDs and Vanc/Zosyn combo -CT scan did not identify any renal mass  2.Prosthetic Joint Infection: s/p procedure with Ortho now on antibiotics. Cx w/ GPCs. F/u further results. Mgmt per primary, ortho, and ID  3.Sepsis: related to joint infection.  Antibiotics as per primary service  4.Anemia: likely multifactorial w/ acute illness contributing. hgb of 6.9. No iron given infection. Transfusion as needed  5. NAGMA: likely 2/2 AKI. Bicarb 20. CTM we will add sodium bicarbonate 625 mg twice daily  6.  Volume/hypertension.  Urine output is good.  Creatinine seems to be  improving we will continue to follow     LOS: War @TODAY @11 :00 AM

## 2021-03-10 DIAGNOSIS — N179 Acute kidney failure, unspecified: Secondary | ICD-10-CM | POA: Diagnosis not present

## 2021-03-10 LAB — GLUCOSE, CAPILLARY
Glucose-Capillary: 129 mg/dL — ABNORMAL HIGH (ref 70–99)
Glucose-Capillary: 175 mg/dL — ABNORMAL HIGH (ref 70–99)
Glucose-Capillary: 146 mg/dL — ABNORMAL HIGH (ref 70–99)
Glucose-Capillary: 194 mg/dL — ABNORMAL HIGH (ref 70–99)
Glucose-Capillary: 136 mg/dL — ABNORMAL HIGH (ref 70–99)

## 2021-03-10 LAB — BASIC METABOLIC PANEL
Anion gap: 8 (ref 5–15)
BUN: 83 mg/dL — ABNORMAL HIGH (ref 8–23)
CO2: 23 mmol/L (ref 22–32)
Calcium: 8.6 mg/dL — ABNORMAL LOW (ref 8.9–10.3)
Chloride: 113 mmol/L — ABNORMAL HIGH (ref 98–111)
Creatinine, Ser: 2.88 mg/dL — ABNORMAL HIGH (ref 0.44–1.00)
GFR, Estimated: 15 mL/min — ABNORMAL LOW (ref >60)
Glucose, Bld: 139 mg/dL — ABNORMAL HIGH (ref 70–99)
Potassium: 3.4 mmol/L — ABNORMAL LOW (ref 3.5–5.1)
Sodium: 144 mmol/L (ref 135–145)

## 2021-03-10 LAB — CBC WITH DIFFERENTIAL/PLATELET
Abs Immature Granulocytes: 0.09 10*3/uL — ABNORMAL HIGH (ref 0.00–0.07)
Basophils Absolute: 0.1 10*3/uL (ref 0.0–0.1)
Basophils Relative: 0 %
Eosinophils Absolute: 0.2 10*3/uL (ref 0.0–0.5)
Eosinophils Relative: 1 %
HCT: 26.8 % — ABNORMAL LOW (ref 36.0–46.0)
Hemoglobin: 8.5 g/dL — ABNORMAL LOW (ref 12.0–15.0)
Immature Granulocytes: 1 %
Lymphocytes Relative: 6 %
Lymphs Abs: 0.8 10*3/uL (ref 0.7–4.0)
MCH: 28.2 pg (ref 26.0–34.0)
MCHC: 31.7 g/dL (ref 30.0–36.0)
MCV: 89 fL (ref 80.0–100.0)
Monocytes Absolute: 0.6 10*3/uL (ref 0.1–1.0)
Monocytes Relative: 5 %
Neutro Abs: 10 10*3/uL — ABNORMAL HIGH (ref 1.7–7.7)
Neutrophils Relative %: 87 %
Platelets: 348 10*3/uL (ref 150–400)
RBC: 3.01 MIL/uL — ABNORMAL LOW (ref 3.87–5.11)
RDW: 18.7 % — ABNORMAL HIGH (ref 11.5–15.5)
WBC: 11.7 10*3/uL — ABNORMAL HIGH (ref 4.0–10.5)
nRBC: 0 % (ref 0.0–0.2)

## 2021-03-10 LAB — LACTATE DEHYDROGENASE: LDH: 183 U/L (ref 98–192)

## 2021-03-10 LAB — HEPATIC FUNCTION PANEL
ALT: 25 U/L (ref 0–44)
AST: 35 U/L (ref 15–41)
Albumin: 1.9 g/dL — ABNORMAL LOW (ref 3.5–5.0)
Alkaline Phosphatase: 57 U/L (ref 38–126)
Bilirubin, Direct: <0.1 mg/dL (ref 0.0–0.2)
Total Bilirubin: 0.3 mg/dL (ref 0.3–1.2)
Total Protein: 5.9 g/dL — ABNORMAL LOW (ref 6.5–8.1)

## 2021-03-10 LAB — MAGNESIUM
Magnesium: 1.9 mg/dL (ref 1.7–2.4)
Magnesium: 1.8 mg/dL (ref 1.7–2.4)

## 2021-03-10 LAB — PHOSPHORUS
Phosphorus: 4 mg/dL (ref 2.5–4.6)
Phosphorus: 2.3 mg/dL — ABNORMAL LOW (ref 2.5–4.6)

## 2021-03-10 MED ORDER — SODIUM BICARBONATE 650 MG PO TABS
1300.0000 mg | ORAL_TABLET | Freq: Three times a day (TID) | ORAL | Status: DC
  Administered 2021-03-10 – 2021-03-11 (×6): 1300 mg
  Filled 2021-03-10 (×5): qty 2

## 2021-03-10 MED ORDER — DOCUSATE SODIUM 50 MG/5ML PO LIQD
100.0000 mg | Freq: Two times a day (BID) | ORAL | Status: DC
  Administered 2021-03-10 – 2021-03-11 (×3): 100 mg
  Filled 2021-03-10 (×4): qty 10

## 2021-03-10 MED ORDER — SENNOSIDES-DOCUSATE SODIUM 8.6-50 MG PO TABS
1.0000 | ORAL_TABLET | Freq: Every day | ORAL | Status: DC
  Administered 2021-03-11: 1
  Filled 2021-03-10: qty 1

## 2021-03-10 MED ORDER — VITAMIN B-12 1000 MCG PO TABS
1000.0000 ug | ORAL_TABLET | Freq: Every day | ORAL | Status: DC
  Administered 2021-03-10 – 2021-03-14 (×5): 1000 ug
  Filled 2021-03-10 (×4): qty 1

## 2021-03-10 MED ORDER — ADULT MULTIVITAMIN W/MINERALS CH
1.0000 | ORAL_TABLET | Freq: Every day | ORAL | Status: DC
  Administered 2021-03-10 – 2021-03-14 (×5): 1
  Filled 2021-03-10 (×4): qty 1

## 2021-03-10 MED ORDER — METOCLOPRAMIDE HCL 5 MG/ML IJ SOLN: 5.0000 mg | Freq: Three times a day (TID) | INTRAMUSCULAR | Status: DC | PRN

## 2021-03-10 MED ORDER — POTASSIUM CHLORIDE 20 MEQ PO PACK
40.0000 meq | PACK | Freq: Once | ORAL | Status: AC
  Administered 2021-03-10: 40 meq
  Filled 2021-03-10: qty 2

## 2021-03-10 MED ORDER — ONDANSETRON HCL 4 MG PO TABS
4.0000 mg | ORAL_TABLET | Freq: Four times a day (QID) | ORAL | Status: DC | PRN
Start: 2021-03-10 — End: 2021-03-19

## 2021-03-10 MED ORDER — ONDANSETRON HCL 4 MG/2ML IJ SOLN
4.0000 mg | Freq: Four times a day (QID) | INTRAMUSCULAR | Status: DC | PRN
  Administered 2021-03-13 – 2021-03-15 (×3): 4 mg via INTRAVENOUS
  Filled 2021-03-10 (×3): qty 2

## 2021-03-10 MED ORDER — METOPROLOL TARTRATE 25 MG PO TABS
25.0000 mg | ORAL_TABLET | Freq: Two times a day (BID) | ORAL | Status: DC
  Administered 2021-03-10 – 2021-03-12 (×4): 25 mg
  Filled 2021-03-10 (×4): qty 1

## 2021-03-10 MED ORDER — ACETAMINOPHEN 325 MG PO TABS
325.0000 mg | ORAL_TABLET | Freq: Four times a day (QID) | ORAL | Status: DC | PRN
  Administered 2021-03-10 – 2021-03-12 (×2): 650 mg
  Filled 2021-03-10 (×2): qty 2

## 2021-03-10 MED ORDER — DILTIAZEM LOAD VIA INFUSION
15.0000 mg | Freq: Once | INTRAVENOUS | Status: AC
  Administered 2021-03-10: 15 mg via INTRAVENOUS
  Filled 2021-03-10: qty 15

## 2021-03-10 MED ORDER — DILTIAZEM HCL 25 MG/5ML IV SOLN
15.0000 mg | Freq: Once | INTRAVENOUS | Status: DC
  Filled 2021-03-10: qty 5

## 2021-03-10 MED ORDER — LEVOTHYROXINE SODIUM 100 MCG PO TABS
100.0000 ug | ORAL_TABLET | Freq: Every day | ORAL | Status: DC
  Administered 2021-03-11 – 2021-03-13 (×3): 100 ug
  Filled 2021-03-10 (×3): qty 1

## 2021-03-10 MED ORDER — ALUM & MAG HYDROXIDE-SIMETH 200-200-20 MG/5ML PO SUSP: 30.0000 mL | ORAL | Status: DC | PRN

## 2021-03-10 MED ORDER — POLYETHYLENE GLYCOL 3350 17 G PO PACK
17.0000 g | PACK | Freq: Every day | ORAL | Status: DC
  Administered 2021-03-10 – 2021-03-11 (×2): 17 g
  Filled 2021-03-10 (×2): qty 1

## 2021-03-10 MED ORDER — DILTIAZEM HCL-DEXTROSE 125-5 MG/125ML-% IV SOLN (PREMIX)
5.0000 mg/h | INTRAVENOUS | Status: DC
  Administered 2021-03-10: 10 mg/h via INTRAVENOUS
  Administered 2021-03-10: 5 mg/h via INTRAVENOUS
  Administered 2021-03-11: 7.5 mg/h via INTRAVENOUS
  Filled 2021-03-10 (×4): qty 125

## 2021-03-10 MED ORDER — CILOSTAZOL 100 MG PO TABS
100.0000 mg | ORAL_TABLET | Freq: Two times a day (BID) | ORAL | Status: DC
  Administered 2021-03-10 – 2021-03-14 (×7): 100 mg
  Filled 2021-03-10 (×13): qty 1

## 2021-03-10 MED ORDER — ENSURE ENLIVE PO LIQD
237.0000 mL | Freq: Three times a day (TID) | ORAL | Status: DC
  Administered 2021-03-10 – 2021-03-12 (×6): 237 mL

## 2021-03-10 MED ORDER — METOCLOPRAMIDE HCL 5 MG PO TABS
5.0000 mg | ORAL_TABLET | Freq: Three times a day (TID) | ORAL | Status: DC | PRN
  Filled 2021-03-10: qty 2

## 2021-03-10 MED ORDER — POTASSIUM CHLORIDE CRYS ER 20 MEQ PO TBCR: 40.0000 meq | EXTENDED_RELEASE_TABLET | Freq: Once | ORAL | Status: DC

## 2021-03-10 NOTE — Progress Notes (Addendum)
PROGRESS NOTE    [name]  OZD:[id number]  DOB: 07/24/1929  DOA: 03/06/2021 PCP: Rosita Fire, MD Outpatient Specialists:   Hospital course: 87 year old female with distal femur fracture 12/2020 s/p femoral replacement, PAF, HFpEF, aphasia, PVD was admitted for 1022 with altered mental status/acute metabolic encephalopathy and acute on chronic kidney injury.  Work-up is revealed  Periprosthetic joint infection and stage IV decubitus ulcers in sacral area.  She was started on vancomycin and ceftriaxone and patient was taken to the OR for washout.  Intraoperative cultures have shown GPC in pairs and chains.  Patient has been followed by infectious disease and has been started on daptomycin and ceftriaxone.  Patient's course has been complicated by worsening kidney dysfunction and developing oliguria.  Goals for care have been discussed and palliative care has been involved with the patient however it appears that family is unrealistic in terms of treatment potential.  Subjective:  Patient states that she would like help turning over in bed.  States she is too weak to sit up on her own.   Objective: Vitals:   03/09/21 2143 03/10/21 0549 03/10/21 0807 03/10/21 1159  BP: (!) 135/55 (!) 145/70 135/71 (!) 145/97  Pulse: 81 80    Resp: 20 17    Temp: 98.5 F (36.9 C) 98.1 F (36.7 C)    TempSrc: Oral Oral    SpO2: 100% 100% 98% 100%  Weight:      Height:        Intake/Output Summary (Last 24 hours) at 03/10/2021 1528 Last data filed at 03/10/2021 1300 Gross per 24 hour  Intake 525.83 ml  Output 810 ml  Net -284.17 ml   Filed Weights   02/26/21 0148 02/28/21 0500 03/02/21 1024  Weight: 61.2 kg 61.3 kg 99 kg     Exam:  General: Chronically ill-appearing female lying towards the right side, able to speak softly but coherently in short sentences. Eyes: sclera anicteric, conjuctiva mild injection bilaterally CVS: S1-S2, regular  Respiratory:  decreased air entry  bilaterally secondary to decreased inspiratory effort, rales at bases  GI: NABS, soft, NT  LE: No edema.  Neuro: As noted above, patient is able to speak softly but coherently in short sentences.  Otherwise neuro exam is difficult to assess given inability to follow commands and general debility.   Assessment & Plan:   87 year old female is admitted with the periprostatic joint infection and is on daptomycin and ceftriaxone.  She also has stage IV decubitus ulcers which are likely infected and possibly have osteomyelitis.  Patient's kidney function has continued to deteriorate with developing oliguria.  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 twice daily.  AKI Very much appreciate ongoing nephrology consultation Patient seems to be developing oliguria although accuracy of I's and O's are not clear UA is abnormal with WBCs greater than 50 and spillage of protein Patient is off vancomycin and now is on daptomycin Continue to optimize fluids GFR is down to 15 Continue to address goals for care with the family   Ankle decubitus with OM with known PVD MRI of the ankle shows osteomyelitis As noted above we will continue daptomycin and ceftriaxone Definitive treatment would require BKA Unclear if patient would tolerate surgery Continue Pletal  Sacral decubitus with OM of distal sacral and coccyx  OM of distal sacral and coccyx is a very poor prognostic indicator Continue wound management and antibiotics as above Will need to communicate with family that likelihood of this healing is very low given multiple morbidities  PAF She had multiple episodes of RVR despite being on metoprolol Patient had no chest pain throughout this Will place patient on  diltiazem drip to control rate Continue metoprolol We will try to wean off diltiazem drip tomorrow if it is tolerated  Goals for care Patient family have been seen by palliative care consultation Family continues to want aggressive care despite recommendations to the contrary Core track was placed for tube feeds TOC are involved for SNF/LTAC placement upon discharge when appropriate  Anemia Patient is required transfusion for drop in H&H after OR Hemoglobin is stable overnight She is hemodynamically stable with reasonable blood pressure The eye has seen patient and did not think she had a GI bleed Hepatic panel done today is not suggestive of hemolysis  Metabolic encephalopathy Patient was able to speak to me today and was making sense Although she was clearly weak and was speaking very softly, she was not acutely confused, Niccoli improving with hydration and antibiotics.  HTN Under reasonable control on metoprolol  HFpEF Continue metoprolol  Hypothyroidism Continue Synthroid  DVT prophylaxis: Q heparin Code Status: Full code despite lengthy conversations with family by hospitalist and palliative care and ID Family Communication: None today Disposition Plan:   Patient is from: Home  Anticipated Discharge Location: TBD, SNF versus LTAC  Barriers to Discharge: Ongoing infection and kidney failure  Is patient medically stable for Discharge: No   Consultants:  Nephrology  ID  Antimicrobials:  Ceftriaxone  Daptomycin   Data Reviewed:  Basic Metabolic Panel: Recent Labs  Lab 03/04/21 0740 03/05/21 0323 03/07/21 0355 03/07/21 1830 03/08/21 0500 03/09/21 0430 03/09/21 1600 03/10/21 0021  NA 142   < > 142 141 143 145  --  144  K 4.6   < > 4.3 4.0 3.6 3.6  --  3.4*  CL 115*   < > 114* 112* 113* 114*  --  113*  CO2 20*   < > 19* 21* 22 23  --  23  GLUCOSE 124*   < > 75 96 73 67*  --  139*  BUN 82*   < > 84* 80* 75* 73*  --  83*  CREATININE 2.48*   < >  2.94* 2.91* 2.89* 2.74*  --  2.88*  CALCIUM 8.2*   < > 8.7* 8.8* 8.9 8.9  --  8.6*  MG  --   --   --   --   --  2.0 2.0 1.9  PHOS 3.5  --   --   --   --  4.5 4.4 4.0   < > = values in this interval not displayed.   Liver Function Tests: Recent Labs  Lab 03/04/21 0519 03/04/21 0740 03/10/21 0945  AST 32  --  35  ALT 9  --  25  ALKPHOS 48  --  57  BILITOT 0.8  --  0.3  PROT 5.0*  --  5.9*  ALBUMIN 1.3* 1.4* 1.9*   No results for input(s): LIPASE, AMYLASE in the last 168 hours. No results for input(s): AMMONIA in the last 168 hours. CBC: Recent Labs  Lab 03/06/21 0305 03/06/21 1010 03/07/21 0355 03/07/21 1830 03/08/21 0500 03/09/21 0430 03/10/21 0021  WBC 13.5*  --  13.6*  --  11.2* 12.2* 11.7*  NEUTROABS  10.9*  --  11.2*  --  9.6* 10.5* 10.0*  HGB 6.9*   < > 8.5* 8.9* 8.7* 8.8* 8.5*  HCT 21.6*   < > 26.5* 27.5* 27.2* 27.8* 26.8*  MCV 87.8  --  88.3  --  88.3 89.7 89.0  PLT 197  --  258  --  309 333 348   < > = values in this interval not displayed.   Cardiac Enzymes: Recent Labs  Lab 03/04/21 0519 03/06/21 0305  CKTOTAL 97 87   BNP (last 3 results) No results for input(s): PROBNP in the last 8760 hours. CBG: Recent Labs  Lab 03/09/21 2033 03/09/21 2157 03/10/21 0101 03/10/21 0836 03/10/21 1202  GLUCAP 97 115* 129* 175* 146*    Recent Results (from the past 240 hour(s))  Body fluid culture w Gram Stain     Status: None   Collection Time: 03/01/21 11:02 AM   Specimen: Body Fluid  Result Value Ref Range Status   Specimen Description FLUID  Final   Special Requests SYNOVIAL LEFT KNEE  Final   Gram Stain   Final    ABUNDANT WBC PRESENT, PREDOMINANTLY MONONUCLEAR ABUNDANT GRAM POSITIVE COCCI IN PAIRS IN CHAINS    Culture   Final    NO GROWTH 3 DAYS Performed at [facility], [place] [address], [place], [place] [address]    Report Status 03/05/2021 FINAL  Final      Studies: DG Abd Portable 1V  Result Date: 03/09/2021 CLINICAL DATA:   Weakness, severe sepsis due to left leg cellulitis a possible septic left knee with UTI EXAM: PORTABLE ABDOMEN - 1 VIEW COMPARISON:  CT abdomen and pelvis March 05, 2021 FINDINGS: The bowel gas pattern is normal. Enteric feeding catheter with tip overlying the stomach. No radio-opaque calculi or other significant radiographic abnormality are seen. Pelvic phleboliths. Levoconvex curvature of the lumbar spine. Spondylosis. IMPRESSION: 1. Nonobstructive bowel gas pattern. 2. Enteric feeding catheter with tip overlying the stomach. Electronically Signed   By: Dahlia Bailiff MD   On: 03/09/2021 17:10     Scheduled Meds: . Chlorhexidine Gluconate Cloth  6 each Topical Daily  . cilostazol  100 mg Per Tube BID  . docusate  100 mg Per Tube BID  . feeding supplement  237 mL Per Tube TID BM  . feeding supplement (PROSource TF)  45 mL Per Tube TID  . heparin injection (subcutaneous)  5,000 Units Subcutaneous Q12H  . [START ON 03/11/2021] levothyroxine  100 mcg Per Tube Q0600  . mouth rinse  15 mL Mouth Rinse BID  . metoprolol tartrate  25 mg Per Tube BID  . multivitamin with minerals  1 tablet Per Tube Daily  . nutrition supplement (JUVEN)  1 packet Per Tube BID BM  . pantoprazole (PROTONIX) IV  40 mg Intravenous Q12H  . polyethylene glycol  17 g Per Tube Daily  . senna-docusate  1 tablet Per Tube QHS  . sodium bicarbonate  1,300 mg Per Tube TID  . sodium chloride flush  10-40 mL Intracatheter Q12H  . vitamin B-12  1,000 mcg Per Tube Daily   Continuous Infusions: . cefTRIAXone (ROCEPHIN)  IV 2 g (03/09/21 2232)  . DAPTOmycin (CUBICIN)  IV 500 mg (03/09/21 2048)  . diltiazem (CARDIZEM) infusion 5 mg/hr (03/10/21 1431)  . feeding supplement (OSMOLITE 1.5 CAL) 1,000 mL (03/09/21 1746)    Principal Problem:   Severe Sepsis due to left leg cellulitis and possible Lt septic knee and Enterococcus UTI Active Problems:  Essential hypertension   PERIPHERAL VASCULAR DISEASE   GERD   Hypothyroidism    AKI (acute kidney injury) ([facility])   Pressure injury of skin   Generalized weakness   Acute metabolic encephalopathy   Leukocytosis   Hyponatremia   Dehydration   Hypoalbuminemia   Elevated brain natriuretic peptide (BNP) level   Paroxysmal A-fib ([facility])   Altered mental status   Septic infrapatellar bursitis of left knee   Infection of total knee replacement ([place])   Medication monitoring encounter     Vashti Hey, [facility]  If 7PM-7AM, please contact night-coverage [email or web address]   LOS: 13 days

## 2021-03-10 NOTE — Progress Notes (Signed)
[facility] ROUNDING NOTE   Subjective:   Brief history: This is an 87 year old lady with a history of hyperlipidemia peripheral vascular disease atrial fibrillation hypertension hypothyroidism who was admitted with a prosthetic joint infection.  She was treated with vancomycin.  Baseline creatinine is 0.8 to 1 mg/dL.  She has developed acute kidney injury with a creatinine 2.87 mg/dL 03/05/2021.  She has had an improvement in urine output 2.2 L 03/07/2021    Blood pressure 135/71 pulse 80 temperature 98.1 O2 sats 98% room air  Current medications: Cilostazol 100 mg twice daily, levothyroxine 100 mcg daily, metoprolol 25 mg   twice daily, Protonix 40 mg every 12 hours, sodium bicarbonate 1.3 g twice daily.  Megace 400 mg daily.  Remeron 15 mg daily IV Rocephin 2 g every 24 hours, daptomycin 500 mg every 48 hours  Urine output 500 cc 03/09/2021  Sodium 144 potassium 3.4 chloride 113 CO2 23 BUN 83 creatinine 2.88 glucose 139 calcium 8.6 phosphorus 8.5  Urinalysis cloudy.  Patient is on antibiotic therapy at this time.  We will send urine culture.  CT scan did not reveal any evidence of solid mass.   .  She is progressively oliguric and decisions will need to be made regarding appropriateness of initiating hemodialysis.  Objective:  Vital signs in last 24 hours:  Temp:  [98.1 F (36.7 C)-98.6 F (37 C)] 98.1 F (36.7 C) (04/23 0549) Pulse Rate:  [80-98] 80 (04/23 0549) Resp:  [17-20] 17 (04/23 0549) BP: (129-145)/(55-90) 135/71 (04/23 0807) SpO2:  [98 %-100 %] 98 % (04/23 0807)  Weight change:  Filed Weights   02/26/21 0148 02/28/21 0500 03/02/21 1024  Weight: 61.2 kg 61.3 kg 99 kg    Intake/Output: I/O last 3 completed shifts: In: 635.8 [P.O.:10; NG/GT:305.8; IV Piggyback:320] Out: 560 [Urine:500; Drains:60]   Intake/Output this shift:  No intake/output data recorded.  General: Chronically ill-appearing, lying in bed, no distress HEENT: anicteric sclera,  oropharynx clear without lesions CV: Normal rate, no peripheral edema Lungs: clear to auscultation bilaterally, normal work of breathing Abd: soft, non-tender, non-distended Skin: no visible lesions or rashes Psych: Awake and alert with appropriate mood Musculoskeletal: Left leg wrapped in Ace bandage, otherwise no significant deformities Neuro: normal speech, oriented to person but not place time or situation  Basic Metabolic Panel: Recent Labs  Lab 03/04/21 0740 03/05/21 0323 03/07/21 0355 03/07/21 1830 03/08/21 0500 03/09/21 0430 03/09/21 1600 03/10/21 0021  NA 142   < > 142 141 143 145  --  144  K 4.6   < > 4.3 4.0 3.6 3.6  --  3.4*  CL 115*   < > 114* 112* 113* 114*  --  113*  CO2 20*   < > 19* 21* 22 23  --  23  GLUCOSE 124*   < > 75 96 73 67*  --  139*  BUN 82*   < > 84* 80* 75* 73*  --  83*  CREATININE 2.48*   < > 2.94* 2.91* 2.89* 2.74*  --  2.88*  CALCIUM 8.2*   < > 8.7* 8.8* 8.9 8.9  --  8.6*  MG  --   --   --   --   --  2.0 2.0 1.9  PHOS 3.5  --   --   --   --  4.5 4.4 4.0   < > = values in this interval not displayed.    Liver Function Tests: Recent Labs  Lab 03/04/21 0519 03/04/21  0740  AST 32  --   ALT 9  --   ALKPHOS 48  --   BILITOT 0.8  --   PROT 5.0*  --   ALBUMIN 1.3* 1.4*   No results for input(s): LIPASE, AMYLASE in the last 168 hours. No results for input(s): AMMONIA in the last 168 hours.  CBC: Recent Labs  Lab 03/06/21 0305 03/06/21 1010 03/07/21 0355 03/07/21 1830 03/08/21 0500 03/09/21 0430 03/10/21 0021  WBC 13.5*  --  13.6*  --  11.2* 12.2* 11.7*  NEUTROABS 10.9*  --  11.2*  --  9.6* 10.5* 10.0*  HGB 6.9*   < > 8.5* 8.9* 8.7* 8.8* 8.5*  HCT 21.6*   < > 26.5* 27.5* 27.2* 27.8* 26.8*  MCV 87.8  --  88.3  --  88.3 89.7 89.0  PLT 197  --  258  --  309 333 348   < > = values in this interval not displayed.    Cardiac Enzymes: Recent Labs  Lab 03/04/21 0519 03/06/21 0305  CKTOTAL 97 87    BNP: Invalid input(s):  POCBNP  CBG: Recent Labs  Lab 03/09/21 0947 03/09/21 1530 03/09/21 2033 03/09/21 2157 03/10/21 0101  GLUCAP 111* 72 97 115* 129*    Microbiology: Results for orders placed or performed during the hospital encounter of 02/26/2021  Blood culture (routine single)     Status: None   Collection Time: 02/21/2021  8:57 PM   Specimen: Right Antecubital; Blood  Result Value Ref Range Status   Specimen Description RIGHT ANTECUBITAL  Final   Special Requests   Final    BOTTLES DRAWN AEROBIC AND ANAEROBIC Blood Culture adequate volume   Culture   Final    NO GROWTH 5 DAYS Performed at [facility], [address], [place], [place] [address]    Report Status 03/02/2021 FINAL  Final  Resp Panel by RT-PCR (Flu A&B, Covid) Nasopharyngeal Swab     Status: None   Collection Time: 03/09/2021  9:00 PM   Specimen: Nasopharyngeal Swab; Nasopharyngeal(NP) swabs in vial transport medium  Result Value Ref Range Status   SARS Coronavirus 2 by RT PCR NEGATIVE NEGATIVE Final    Comment: (NOTE) SARS-CoV-2 target nucleic acids are NOT DETECTED.  The SARS-CoV-2 RNA is generally detectable in upper respiratory specimens during the acute phase of infection. The lowest concentration of SARS-CoV-2 viral copies this assay can detect is 138 copies/mL. A negative result does not preclude SARS-Cov-2 infection and should not be used as the sole basis for treatment or other patient management decisions. A negative result may occur with  improper specimen collection/handling, submission of specimen other than nasopharyngeal swab, presence of viral mutation(s) within the areas targeted by this assay, and inadequate number of viral copies(<138 copies/mL). A negative result must be combined with clinical observations, patient history, and epidemiological information. The expected result is Negative.  Fact Sheet for Patients:  EntrepreneurPulse.com.au  Fact Sheet for Healthcare Providers:   IncredibleEmployment.be  This test is no t yet approved or cleared by the [place] FDA and  has been authorized for detection and/or diagnosis of SARS-CoV-2 by FDA under an Emergency Use Authorization (EUA). This EUA will remain  in effect (meaning this test can be used) for the duration of the COVID-19 declaration under Section 564(b)(1) of the Act, 21 U.S.C.section 360bbb-3(b)(1), unless the authorization is terminated  or revoked sooner.       Influenza A by PCR NEGATIVE NEGATIVE Final   Influenza B by PCR  NEGATIVE NEGATIVE Final    Comment: (NOTE) The Xpert Xpress SARS-CoV-2/FLU/RSV plus assay is intended as an aid in the diagnosis of influenza from Nasopharyngeal swab specimens and should not be used as a sole basis for treatment. Nasal washings and aspirates are unacceptable for Xpert Xpress SARS-CoV-2/FLU/RSV testing.  Fact Sheet for Patients: EntrepreneurPulse.com.au  Fact Sheet for Healthcare Providers: IncredibleEmployment.be  This test is not yet approved or cleared by the [place] FDA and has been authorized for detection and/or diagnosis of SARS-CoV-2 by FDA under an Emergency Use Authorization (EUA). This EUA will remain in effect (meaning this test can be used) for the duration of the COVID-19 declaration under Section 564(b)(1) of the Act, 21 U.S.C. section 360bbb-3(b)(1), unless the authorization is terminated or revoked.  Performed at [facility], [address] [place], [place] [address]   Urine culture     Status: Abnormal   Collection Time: 03/11/2021 11:57 PM   Specimen: In/Out Cath Urine  Result Value Ref Range Status   Specimen Description   Final    IN/OUT CATH URINE Performed at [facility], [address], [place], [place] [address]    Special Requests   Final    NONE Performed at [facility], [address], [place], [place] [address]    Culture 80,000 COLONIES/mL ENTEROCOCCUS  AVIUM (A)  Final   Report Status 03/01/2021 FINAL  Final   Organism ID, Bacteria ENTEROCOCCUS AVIUM (A)  Final      Susceptibility   Enterococcus avium - MIC*    AMPICILLIN 16 RESISTANT Resistant     NITROFURANTOIN <=16 SENSITIVE Sensitive     VANCOMYCIN <=0.5 SENSITIVE Sensitive     * 80,000 COLONIES/mL ENTEROCOCCUS AVIUM  Body fluid culture w Gram Stain     Status: None   Collection Time: 03/01/21 11:02 AM   Specimen: Body Fluid  Result Value Ref Range Status   Specimen Description FLUID  Final   Special Requests SYNOVIAL LEFT KNEE  Final   Gram Stain   Final    ABUNDANT WBC PRESENT, PREDOMINANTLY MONONUCLEAR ABUNDANT GRAM POSITIVE COCCI IN PAIRS IN CHAINS    Culture   Final    NO GROWTH 3 DAYS Performed at [facility], [place] [address], [place], [place] [address]    Report Status 03/05/2021 FINAL  Final    Coagulation Studies: No results for input(s): LABPROT, INR in the last 72 hours.  Urinalysis: No results for input(s): COLORURINE, LABSPEC, PHURINE, GLUCOSEU, HGBUR, BILIRUBINUR, KETONESUR, PROTEINUR, UROBILINOGEN, NITRITE, LEUKOCYTESUR in the last 72 hours.  Invalid input(s): APPERANCEUR    Imaging: DG Abd Portable 1V  Result Date: 03/09/2021 CLINICAL DATA:  Weakness, severe sepsis due to left leg cellulitis a possible septic left knee with UTI EXAM: PORTABLE ABDOMEN - 1 VIEW COMPARISON:  CT abdomen and pelvis March 05, 2021 FINDINGS: The bowel gas pattern is normal. Enteric feeding catheter with tip overlying the stomach. No radio-opaque calculi or other significant radiographic abnormality are seen. Pelvic phleboliths. Levoconvex curvature of the lumbar spine. Spondylosis. IMPRESSION: 1. Nonobstructive bowel gas pattern. 2. Enteric feeding catheter with tip overlying the stomach. Electronically Signed   By: Dahlia Bailiff MD   On: 03/09/2021 17:10     Medications:   . cefTRIAXone (ROCEPHIN)  IV 2 g (03/09/21 2232)  . DAPTOmycin (CUBICIN)  IV 500 mg (03/09/21  2048)  . feeding supplement (OSMOLITE 1.5 CAL) 1,000 mL (03/09/21 1746)   . Chlorhexidine Gluconate Cloth  6 each Topical Daily  .  cilostazol  100 mg Oral BID  . docusate sodium  100 mg Oral BID  . feeding supplement  237 mL Oral TID BM  . feeding supplement (PROSource TF)  45 mL Per Tube TID  . heparin injection (subcutaneous)  5,000 Units Subcutaneous Q12H  . levothyroxine  100 mcg Oral Q0600  . mouth rinse  15 mL Mouth Rinse BID  . metoprolol tartrate  25 mg Oral BID  . multivitamin with minerals  1 tablet Oral Daily  . nutrition supplement (JUVEN)  1 packet Per Tube BID BM  . pantoprazole (PROTONIX) IV  40 mg Intravenous Q12H  . polyethylene glycol  17 g Oral Daily  . senna-docusate  1 tablet Oral QHS  . sodium bicarbonate  1,300 mg Oral TID  . sodium chloride flush  10-40 mL Intracatheter Q12H  . vitamin B-12  1,000 mcg Oral Daily   acetaminophen, alum & mag hydroxide-simeth, haloperidol lactate, menthol-cetylpyridinium **OR** phenol, metoCLOPramide **OR** metoCLOPramide (REGLAN) injection, metoprolol tartrate, ondansetron **OR** ondansetron (ZOFRAN) IV, sodium chloride flush  Assessment/ Plan:  Oliguric AKI: BL 0.8-1 but higher outpatient recently and unclear why. AKI here initially improved now worse over past 24 hours w/ low UOP. 5L positive but possibly outs not well documented. She does not appear overloaded and may be slightly dry. AKI likely related to acute illness with possible tubular injury 2/2 sepsis w/ borderline hypotension and ongoing tachycardia. Vanc could be contributing -Underwent administration of 5% albumin 03/05/2021.  Hypoalbuminemia noted on laboratory evaluation -Continue hydration PRN -Urinalysis 100 mg/dL protein.  No protein noted 03/03/2021.  WBCs greater than 50.  RBCs 11-20.  This shows some change from urinalysis of 02/28/2021.  Random vancomycin level of 35 -Continue to monitor daily Cr, Dose meds for GFR -Monitor Daily I/Os, Daily weight  -Maintain  MAP>65 for optimal renal perfusion.  -Avoid nephrotoxic medications including NSAIDs and Vanc/Zosyn combo -CT scan did not identify any renal mass  2.Prosthetic Joint Infection: s/p procedure with Ortho now on antibiotics. Cx w/ GPCs. F/u further results. Mgmt per primary, ortho, and ID  3.Sepsis: related to joint infection.  Antibiotics as per primary service  4.Anemia: likely multifactorial w/ acute illness contributing. hgb of 8.5 no iron given infection. Transfusion as needed  5. NAGMA: likely 2/2 AKI. Bicarb 20. CTM we will add sodium bicarbonate 625 mg twice daily  6.  Volume/hypertension.  Urine output is reasonable.  Creatinine appears to be stable.  There has not been a tremendous improvement in renal function.  I would not offer dialysis and appreciate assistance with palliative medicine.  Fortunately, there are no acute dialytic indications at this time.    LOS: [place] @TODAY @8 :21 AM

## 2021-03-10 NOTE — Plan of Care (Signed)
  Problem: Nutrition: Goal: Adequate nutrition will be maintained Outcome: Progressing   Problem: Coping: Goal: Level of anxiety will decrease Outcome: Progressing   Problem: Elimination: Goal: Will not experience complications related to bowel motility Outcome: Progressing   Problem: Pain Managment: Goal: General experience of comfort will improve Outcome: Progressing   Problem: Safety: Goal: Ability to remain free from injury will improve Outcome: Progressing   

## 2021-03-11 DIAGNOSIS — A419 Sepsis, unspecified organism: Secondary | ICD-10-CM | POA: Diagnosis not present

## 2021-03-11 LAB — MAGNESIUM
Magnesium: 1.8 mg/dL (ref 1.7–2.4)
Magnesium: 1.8 mg/dL (ref 1.7–2.4)

## 2021-03-11 LAB — BASIC METABOLIC PANEL
Anion gap: 10 (ref 5–15)
BUN: 106 mg/dL — ABNORMAL HIGH (ref 8–23)
CO2: 23 mmol/L (ref 22–32)
Calcium: 8.8 mg/dL — ABNORMAL LOW (ref 8.9–10.3)
Chloride: 113 mmol/L — ABNORMAL HIGH (ref 98–111)
Creatinine, Ser: 2.78 mg/dL — ABNORMAL HIGH (ref 0.44–1.00)
GFR, Estimated: 16 mL/min — ABNORMAL LOW (ref >60)
Glucose, Bld: 184 mg/dL — ABNORMAL HIGH (ref 70–99)
Potassium: 4 mmol/L (ref 3.5–5.1)
Sodium: 146 mmol/L — ABNORMAL HIGH (ref 135–145)

## 2021-03-11 LAB — GLUCOSE, CAPILLARY
Glucose-Capillary: 147 mg/dL — ABNORMAL HIGH (ref 70–99)
Glucose-Capillary: 191 mg/dL — ABNORMAL HIGH (ref 70–99)
Glucose-Capillary: 193 mg/dL — ABNORMAL HIGH (ref 70–99)

## 2021-03-11 LAB — PHOSPHORUS
Phosphorus: 1.8 mg/dL — ABNORMAL LOW (ref 2.5–4.6)
Phosphorus: <1 mg/dL — CL (ref 2.5–4.6)

## 2021-03-11 MED ORDER — SODIUM PHOSPHATES 45 MMOLE/15ML IV SOLN
15.0000 mmol | Freq: Once | INTRAVENOUS | Status: AC
  Administered 2021-03-11: 15 mmol via INTRAVENOUS
  Filled 2021-03-11: qty 5

## 2021-03-11 MED ORDER — POTASSIUM & SODIUM PHOSPHATES 280-160-250 MG PO PACK
1.0000 | PACK | Freq: Three times a day (TID) | ORAL | Status: AC
  Administered 2021-03-11 (×3): 1 via ORAL
  Filled 2021-03-11 (×4): qty 1

## 2021-03-11 NOTE — Progress Notes (Addendum)
[facility] ROUNDING NOTE   Subjective:   Brief history: This is an 87 year old lady with a history of hyperlipidemia peripheral vascular disease atrial fibrillation hypertension hypothyroidism who was admitted with a prosthetic joint infection.  She was treated with vancomycin.  Baseline creatinine is 0.8 to 1 mg/dL.  She has developed acute kidney injury with a creatinine 2.87 mg/dL 03/05/2021.     Blood pressure 149/54 pulse 84 O2 sats 100% on 3 L  Current medications: Cilostazol 100 mg twice daily, levothyroxine 100 mcg daily, metoprolol 25 mg   twice daily, Protonix 40 mg every 12 hours, sodium bicarbonate 1.3 g twice daily.  Megace 400 mg daily.  Remeron 15 mg daily IV Rocephin 2 g every 24 hours, daptomycin 500 mg every 48 hours  Urine output 250 cc 03/10/2021  Sodium 146 potassium 4 chloride 113 CO2 23 BUN 106 creatinine 2.78 glucose 184 calcium 8.8 phosphorus 1.8 last hemoglobin 8.5  Urinalysis cloudy.  Patient is on antibiotic therapy at this time.  We will send urine culture.  CT scan did not reveal any evidence of solid mass.   .  She is progressively oliguric and decisions will need to be made regarding appropriateness of initiating hemodialysis.  In my opinion she is not a candidate for outpatient dialysis  Objective:  Vital signs in last 24 hours:  Temp:  [97.9 F (36.6 C)] 97.9 F (36.6 C) (04/23 1945) Pulse Rate:  [66-106] 86 (04/24 0500) Resp:  [18-29] 29 (04/24 0500) BP: (129-154)/(49-97) 147/64 (04/24 0500) SpO2:  [94 %-100 %] 100 % (04/24 0500) Weight:  [94.5 kg] 94.5 kg (04/24 0500)  Weight change:  Filed Weights   02/28/21 0500 03/02/21 1024 03/11/21 0500  Weight: 61.3 kg 99 kg 94.5 kg    Intake/Output: I/O last 3 completed shifts: In: 1053.4 [I.V.:121.6; NG/GT:611.7; IV Piggyback:320.1] Out: 600 [Urine:600]   Intake/Output this shift:  No intake/output data recorded.  General: Chronically ill-appearing, lying in bed, no distress HEENT:  anicteric sclera, oropharynx clear without lesions CV: Normal rate, no peripheral edema Lungs: clear to auscultation bilaterally, normal work of breathing Abd: soft, non-tender, non-distended Skin: no visible lesions or rashes Psych: Awake and alert with appropriate mood Musculoskeletal: Left leg wrapped in Ace bandage, otherwise no significant deformities Neuro: normal speech, oriented to person but not place time or situation  Basic Metabolic Panel: Recent Labs  Lab 03/07/21 1830 03/08/21 0500 03/09/21 0430 03/09/21 1600 03/10/21 0021 03/10/21 1900 03/11/21 0320  NA 141 143 145  --  144  --  146*  K 4.0 3.6 3.6  --  3.4*  --  4.0  CL 112* 113* 114*  --  113*  --  113*  CO2 21* 22 23  --  23  --  23  GLUCOSE 96 73 67*  --  139*  --  184*  BUN 80* 75* 73*  --  83*  --  106*  CREATININE 2.91* 2.89* 2.74*  --  2.88*  --  2.78*  CALCIUM 8.8* 8.9 8.9  --  8.6*  --  8.8*  MG  --   --  2.0 2.0 1.9 1.8 1.8  PHOS  --   --  4.5 4.4 4.0 2.3* 1.8*    Liver Function Tests: Recent Labs  Lab 03/10/21 0945  AST 35  ALT 25  ALKPHOS 57  BILITOT 0.3  PROT 5.9*  ALBUMIN 1.9*   No results for input(s): LIPASE, AMYLASE in the last 168 hours. No results for input(s):  AMMONIA in the last 168 hours.  CBC: Recent Labs  Lab 03/06/21 0305 03/06/21 1010 03/07/21 0355 03/07/21 1830 03/08/21 0500 03/09/21 0430 03/10/21 0021  WBC 13.5*  --  13.6*  --  11.2* 12.2* 11.7*  NEUTROABS 10.9*  --  11.2*  --  9.6* 10.5* 10.0*  HGB 6.9*   < > 8.5* 8.9* 8.7* 8.8* 8.5*  HCT 21.6*   < > 26.5* 27.5* 27.2* 27.8* 26.8*  MCV 87.8  --  88.3  --  88.3 89.7 89.0  PLT 197  --  258  --  309 333 348   < > = values in this interval not displayed.    Cardiac Enzymes: Recent Labs  Lab 03/06/21 0305  CKTOTAL 87    BNP: Invalid input(s): POCBNP  CBG: Recent Labs  Lab 03/10/21 0836 03/10/21 1202 03/10/21 1627 03/10/21 2104 03/11/21 0813  GLUCAP 175* 146* 194* 136* 191*     Microbiology: Results for orders placed or performed during the hospital encounter of 02/23/2021  Blood culture (routine single)     Status: None   Collection Time: 03/04/2021  8:57 PM   Specimen: Right Antecubital; Blood  Result Value Ref Range Status   Specimen Description RIGHT ANTECUBITAL  Final   Special Requests   Final    BOTTLES DRAWN AEROBIC AND ANAEROBIC Blood Culture adequate volume   Culture   Final    NO GROWTH 5 DAYS Performed at [facility], [address] [place], [place] [address]    Report Status 03/02/2021 FINAL  Final  Resp Panel by RT-PCR (Flu A&B, Covid) Nasopharyngeal Swab     Status: None   Collection Time: 03/04/2021  9:00 PM   Specimen: Nasopharyngeal Swab; Nasopharyngeal(NP) swabs in vial transport medium  Result Value Ref Range Status   SARS Coronavirus 2 by RT PCR NEGATIVE NEGATIVE Final    Comment: (NOTE) SARS-CoV-2 target nucleic acids are NOT DETECTED.  The SARS-CoV-2 RNA is generally detectable in upper respiratory specimens during the acute phase of infection. The lowest concentration of SARS-CoV-2 viral copies this assay can detect is 138 copies/mL. A negative result does not preclude SARS-Cov-2 infection and should not be used as the sole basis for treatment or other patient management decisions. A negative result may occur with  improper specimen collection/handling, submission of specimen other than nasopharyngeal swab, presence of viral mutation(s) within the areas targeted by this assay, and inadequate number of viral copies(<138 copies/mL). A negative result must be combined with clinical observations, patient history, and epidemiological information. The expected result is Negative.  Fact Sheet for Patients:  EntrepreneurPulse.com.au  Fact Sheet for Healthcare Providers:  IncredibleEmployment.be  This test is no t yet approved or cleared by the [place] FDA and  has been authorized for  detection and/or diagnosis of SARS-CoV-2 by FDA under an Emergency Use Authorization (EUA). This EUA will remain  in effect (meaning this test can be used) for the duration of the COVID-19 declaration under Section 564(b)(1) of the Act, 21 U.S.C.section 360bbb-3(b)(1), unless the authorization is terminated  or revoked sooner.       Influenza A by PCR NEGATIVE NEGATIVE Final   Influenza B by PCR NEGATIVE NEGATIVE Final    Comment: (NOTE) The Xpert Xpress SARS-CoV-2/FLU/RSV plus assay is intended as an aid in the diagnosis of influenza from Nasopharyngeal swab specimens and should not be used as a sole basis for treatment. Nasal washings and aspirates are unacceptable for Xpert Xpress SARS-CoV-2/FLU/RSV testing.  Fact Sheet for Patients:  EntrepreneurPulse.com.au  Fact Sheet for Healthcare Providers: IncredibleEmployment.be  This test is not yet approved or cleared by the [place] FDA and has been authorized for detection and/or diagnosis of SARS-CoV-2 by FDA under an Emergency Use Authorization (EUA). This EUA will remain in effect (meaning this test can be used) for the duration of the COVID-19 declaration under Section 564(b)(1) of the Act, 21 U.S.C. section 360bbb-3(b)(1), unless the authorization is terminated or revoked.  Performed at [facility], [address], [place], [place] [address]   Urine culture     Status: Abnormal   Collection Time: 02/24/2021 11:57 PM   Specimen: In/Out Cath Urine  Result Value Ref Range Status   Specimen Description   Final    IN/OUT CATH URINE Performed at [facility], [address] [place], [place] [address]    Special Requests   Final    NONE Performed at [facility], [address], [place], [place] [address]    Culture 80,000 COLONIES/mL ENTEROCOCCUS AVIUM (A)  Final   Report Status 03/01/2021 FINAL  Final   Organism ID, Bacteria ENTEROCOCCUS AVIUM (A)  Final      Susceptibility    Enterococcus avium - MIC*    AMPICILLIN 16 RESISTANT Resistant     NITROFURANTOIN <=16 SENSITIVE Sensitive     VANCOMYCIN <=0.5 SENSITIVE Sensitive     * 80,000 COLONIES/mL ENTEROCOCCUS AVIUM  Body fluid culture w Gram Stain     Status: None   Collection Time: 03/01/21 11:02 AM   Specimen: Body Fluid  Result Value Ref Range Status   Specimen Description FLUID  Final   Special Requests SYNOVIAL LEFT KNEE  Final   Gram Stain   Final    ABUNDANT WBC PRESENT, PREDOMINANTLY MONONUCLEAR ABUNDANT GRAM POSITIVE COCCI IN PAIRS IN CHAINS    Culture   Final    NO GROWTH 3 DAYS Performed at [facility], [place] [address], [place], [place] [address]    Report Status 03/05/2021 FINAL  Final    Coagulation Studies: No results for input(s): LABPROT, INR in the last 72 hours.  Urinalysis: No results for input(s): COLORURINE, LABSPEC, PHURINE, GLUCOSEU, HGBUR, BILIRUBINUR, KETONESUR, PROTEINUR, UROBILINOGEN, NITRITE, LEUKOCYTESUR in the last 72 hours.  Invalid input(s): APPERANCEUR    Imaging: DG Abd Portable 1V  Result Date: 03/09/2021 CLINICAL DATA:  Weakness, severe sepsis due to left leg cellulitis a possible septic left knee with UTI EXAM: PORTABLE ABDOMEN - 1 VIEW COMPARISON:  CT abdomen and pelvis March 05, 2021 FINDINGS: The bowel gas pattern is normal. Enteric feeding catheter with tip overlying the stomach. No radio-opaque calculi or other significant radiographic abnormality are seen. Pelvic phleboliths. Levoconvex curvature of the lumbar spine. Spondylosis. IMPRESSION: 1. Nonobstructive bowel gas pattern. 2. Enteric feeding catheter with tip overlying the stomach. Electronically Signed   By: Dahlia Bailiff MD   On: 03/09/2021 17:10     Medications:   . cefTRIAXone (ROCEPHIN)  IV Stopped (03/11/21 0011)  . DAPTOmycin (CUBICIN)  IV 500 mg (03/09/21 2048)  . diltiazem (CARDIZEM) infusion 7.5 mg/hr (03/11/21 0553)  . feeding supplement (OSMOLITE 1.5 CAL) 1,000 mL (03/09/21 1746)    . Chlorhexidine Gluconate Cloth  6 each Topical Daily  . cilostazol  100 mg Per Tube BID  . docusate  100 mg Per Tube BID  . feeding supplement  237 mL Per Tube TID BM  . feeding supplement (PROSource TF)  45 mL Per Tube TID  . heparin injection (subcutaneous)  5,000 Units  Subcutaneous Q12H  . levothyroxine  100 mcg Per Tube Q0600  . mouth rinse  15 mL Mouth Rinse BID  . metoprolol tartrate  25 mg Per Tube BID  . multivitamin with minerals  1 tablet Per Tube Daily  . nutrition supplement (JUVEN)  1 packet Per Tube BID BM  . pantoprazole (PROTONIX) IV  40 mg Intravenous Q12H  . polyethylene glycol  17 g Per Tube Daily  . senna-docusate  1 tablet Per Tube QHS  . sodium bicarbonate  1,300 mg Per Tube TID  . sodium chloride flush  10-40 mL Intracatheter Q12H  . vitamin B-12  1,000 mcg Per Tube Daily   acetaminophen, alum & mag hydroxide-simeth, haloperidol lactate, menthol-cetylpyridinium **OR** phenol, metoCLOPramide **OR** metoCLOPramide (REGLAN) injection, metoprolol tartrate, ondansetron **OR** ondansetron (ZOFRAN) IV, sodium chloride flush  Assessment/ Plan:  Oliguric AKI: BL 0.8-1 but higher outpatient recently and unclear why. AKI here initially improved now worse over past 24 hours w/ low UOP. 5L positive but possibly outs not well documented. She does not appear overloaded and may be slightly dry. AKI likely related to acute illness with possible tubular injury 2/2 sepsis w/ borderline hypotension and ongoing tachycardia. Vanc could be contributing -Underwent administration of 5% albumin 03/05/2021.  Hypoalbuminemia noted on laboratory evaluation -Continue hydration PRN -Urinalysis 100 mg/dL protein.  No protein noted 03/08/2021.  WBCs greater than 50.  RBCs 11-20.  This shows some change from urinalysis of 03/10/2021.  Random vancomycin level of 35 -Continue to monitor daily Cr, Dose meds for GFR -Monitor Daily I/Os, Daily weight  -Maintain MAP>65 for optimal renal perfusion.   -Avoid nephrotoxic medications including NSAIDs and Vanc/Zosyn combo -CT scan did not identify any renal mass  2.Prosthetic Joint Infection: s/p procedure with Ortho now on antibiotics. Cx w/ GPCs. F/u further results. Mgmt per primary, ortho, and ID  3.Sepsis: related to joint infection.  Antibiotics as per primary service  4.Anemia: likely multifactorial w/ acute illness contributing. hgb of 8.5 no iron given infection. Transfusion as needed  5. NAGMA: likely 2/2 AKI. Bicarb 20. CTM we will add sodium bicarbonate 625 mg twice daily  6.  Volume/hypertension.  Urine output is reasonable.  Creatinine appears to be stable.  There has not been a tremendous improvement in renal function.  I would not offer dialysis and appreciate assistance with palliative medicine.  Fortunately, there are no acute dialytic indications at this time.  7.  Hypophosphatemia we will add oral phosphate replacement x3 doses    LOS: [place] @TODAY @9 :39 AM

## 2021-03-11 NOTE — Progress Notes (Signed)
PROGRESS NOTE    [name]  IFO:[id number]  DOB: 02-Feb-1929  DOA: 03/07/2021 PCP: Rosita Fire, MD Outpatient Specialists:   Hospital course: 87 year old female with distal femur fracture 12/2020 s/p femoral replacement, PAF, HFpEF, aphasia, PVD was admitted for 1022 with altered mental status/acute metabolic encephalopathy and acute on chronic kidney injury.  Work-up is revealed  Periprosthetic joint infection and stage IV decubitus ulcers in sacral area.  She was started on vancomycin and ceftriaxone and patient was taken to the OR for washout.  Intraoperative cultures have shown GPC in pairs and chains.  Patient has been followed by infectious disease and has been started on daptomycin and ceftriaxone.  Patient's course has been complicated by worsening kidney dysfunction and developing oliguria.  Goals for care have been discussed and palliative care has been involved with the patient however it appears that family is unrealistic in terms of treatment potential.  Subjective:  Patient lying in bed with oxygen mask on face asleep.  Patient's family is at bedside and wants to know why she has an oxygen mask on instead of nasal cannula.  Discussed the fact the patient has had RVR and required to go back on diltiazem drip.I expressed that my concern that patient is getting sicker and sicker and that given sacral osteomyelitis and impending renal failure, there is not much more we can offer her for meaningful recovery.  Objective: Vitals:   03/11/21 0800 03/11/21 1000 03/11/21 1100 03/11/21 1200  BP: (!) 150/55 136/60  (!) 147/55  Pulse: 84   90  Resp:      Temp:      TempSrc:      SpO2: 100% 100% 100% 100%  Weight:      Height:        Intake/Output Summary (Last 24 hours) at 03/11/2021 1626 Last data filed at 03/11/2021 0553 Gross per 24 hour  Intake 527.53 ml  Output --  Net 527.53 ml   Filed Weights   02/28/21 0500 03/02/21 1024 03/11/21 0500  Weight: 61.3 kg 99 kg  94.5 kg     Exam:  General: Chronically ill-appearing patient lying flat in bed with oxygen and core track in place in NAD.   CVS: S1-S2, irregular  Respiratory:  decreased air entry bilaterally secondary to decreased inspiratory effort, rales at bases  GI: NABS, soft, NT  LE: No edema.    Assessment & Plan:   87 year old female is admitted with the periprostatic joint infection and is on daptomycin and ceftriaxone.  She also has stage IV decubitus ulcers which are likely infected and possibly have osteomyelitis.  Patient's kidney function has continued to deteriorate with developing oliguria.  Yesterday she developed RVR with her known atrial fibrillation.   Patient's family continues to want aggressive care despite multiple conversations with several physicians and palliative care that there is no meaningful hope of recovery given sacral osteomyelitis and impending renal failure.   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 twice daily.  AKI Very much appreciate ongoing nephrology consultation Patient seems to be developing oliguria and need for HD will need to be addressed in the near future per nephrology note.  They do not feel that she is likely a candidate for outpatient HD.  Once again goals of care need to be readdressed  with family.  Ankle decubitus with OM with known PVD MRI of the ankle shows osteomyelitis As noted above we will continue daptomycin and ceftriaxone Definitive treatment would require BKA Unclear if patient would tolerate surgery Continue Pletal  Sacral decubitus with OM of distal sacral and coccyx OM of distal sacral and coccyx is a very poor prognostic indicator Continue wound management and antibiotics as above Will  need to communicate with family that likelihood of this healing is very low given multiple morbidities  PAF Presently on diltiazem drip to control rate Can consider increasing metoprolol and weaning off drip tomorrow if tolerated by blood pressure.  Goals for care Patient family have been seen by palliative care consultation Family continues to want aggressive care despite recommendations to the contrary Core track was placed for tube feeds TOC are involved for SNF/LTAC placement upon discharge when appropriate  Anemia Patient is required transfusion for drop in H&H after OR Hemoglobin is stable overnight She is hemodynamically stable with reasonable blood pressure The eye has seen patient and did not think she had a GI bleed Hepatic panel done today is not suggestive of hemolysis  Metabolic encephalopathy Patient was able to speak to me today and was making sense Although she was clearly weak and was speaking very softly, she was not acutely confused, Niccoli improving with hydration and antibiotics.  HTN Under reasonable control on metoprolol  HFpEF Continue metoprolol  Hypothyroidism Continue Synthroid  DVT prophylaxis: Q heparin Code Status: Full code despite lengthy conversations with family by hospitalist and palliative care and ID Family Communication: None today Disposition Plan:   Patient is from: Home  Anticipated Discharge Location: TBD, SNF versus LTAC  Barriers to Discharge: Ongoing infection and kidney failure  Is patient medically stable for Discharge: No   Consultants:  Nephrology  ID  Antimicrobials:  Ceftriaxone  Daptomycin   Data Reviewed:  Basic Metabolic Panel: Recent Labs  Lab 03/07/21 1830 03/08/21 0500 03/09/21 0430 03/09/21 1600 03/10/21 0021 03/10/21 1900 03/11/21 0320  NA 141 143 145  --  144  --  146*  K 4.0 3.6 3.6  --  3.4*  --  4.0  CL 112* 113* 114*  --  113*  --  113*  CO2 21* 22 23  --  23  --  23  GLUCOSE 96 73  67*  --  139*  --  184*  BUN 80* 75* 73*  --  83*  --  106*  CREATININE 2.91* 2.89* 2.74*  --  2.88*  --  2.78*  CALCIUM 8.8* 8.9 8.9  --  8.6*  --  8.8*  MG  --   --  2.0 2.0 1.9 1.8 1.8  PHOS  --   --  4.5 4.4 4.0 2.3* 1.8*   Liver Function Tests: Recent Labs  Lab 03/10/21 0945  AST 35  ALT 25  ALKPHOS 57  BILITOT 0.3  PROT 5.9*  ALBUMIN 1.9*   No results for input(s): LIPASE, AMYLASE in the last 168 hours. No results for input(s): AMMONIA in the last 168 hours. CBC: Recent Labs  Lab 03/06/21 0305 03/06/21 1010 03/07/21 0355 03/07/21 1830 03/08/21 0500 03/09/21 0430 03/10/21 0021  WBC 13.5*  --  13.6*  --  11.2* 12.2* 11.7*  NEUTROABS 10.9*  --  11.2*  --  9.6* 10.5* 10.0*  HGB 6.9*   < > 8.5* 8.9* 8.7* 8.8* 8.5*  HCT 21.6*   < > 26.5* 27.5* 27.2* 27.8* 26.8*  MCV 87.8  --  88.3  --  88.3 89.7 89.0  PLT 197  --  258  --  309 333 348   < > = values in this interval not displayed.   Cardiac Enzymes: Recent Labs  Lab 03/06/21 0305  CKTOTAL 87   BNP (last 3 results) No results for input(s): PROBNP in the last 8760 hours. CBG: Recent Labs  Lab 03/10/21 0836 03/10/21 1202 03/10/21 1627 03/10/21 2104 03/11/21 0813  GLUCAP 175* 146* 194* 136* 191*    No results found for this or any previous visit (from the past 240 hour(s)).    Studies: DG Abd Portable 1V  Result Date: 03/09/2021 CLINICAL DATA:  Weakness, severe sepsis due to left leg cellulitis a possible septic left knee with UTI EXAM: PORTABLE ABDOMEN - 1 VIEW COMPARISON:  CT abdomen and pelvis March 05, 2021 FINDINGS: The bowel gas pattern is normal. Enteric feeding catheter with tip overlying the stomach. No radio-opaque calculi or other significant radiographic abnormality are seen. Pelvic phleboliths. Levoconvex curvature of the lumbar spine. Spondylosis. IMPRESSION: 1. Nonobstructive bowel gas pattern. 2. Enteric feeding catheter with tip overlying the stomach. Electronically Signed   By: Dahlia Bailiff MD   On: 03/09/2021 17:10     Scheduled Meds: . Chlorhexidine Gluconate Cloth  6 each Topical Daily  . cilostazol  100 mg Per Tube BID  . docusate  100 mg Per Tube BID  . feeding supplement  237 mL Per Tube TID BM  . feeding supplement (PROSource TF)  45 mL Per Tube TID  . heparin injection (subcutaneous)  5,000 Units Subcutaneous Q12H  . levothyroxine  100 mcg Per Tube Q0600  . mouth rinse  15 mL Mouth Rinse BID  . metoprolol tartrate  25 mg Per Tube BID  . multivitamin with minerals  1 tablet Per Tube Daily  . nutrition supplement (JUVEN)  1 packet Per Tube BID BM  . pantoprazole (PROTONIX) IV  40 mg Intravenous Q12H  . polyethylene glycol  17 g Per Tube Daily  . potassium & sodium phosphates  1 packet Oral TID AC & HS  . senna-docusate  1 tablet Per Tube QHS  . sodium bicarbonate  1,300 mg Per Tube TID  . sodium chloride flush  10-40 mL Intracatheter Q12H  . vitamin B-12  1,000 mcg Per Tube Daily   Continuous Infusions: . cefTRIAXone (ROCEPHIN)  IV Stopped (03/11/21 0011)  . DAPTOmycin (CUBICIN)  IV 500 mg (03/09/21 2048)  . diltiazem (CARDIZEM) infusion 7.5 mg/hr (03/11/21 1415)  . feeding supplement (OSMOLITE 1.5 CAL) 1,000 mL (03/09/21 1746)    Principal Problem:   Severe Sepsis due to left leg cellulitis and possible Lt septic knee and Enterococcus UTI Active Problems:   Essential hypertension   PERIPHERAL VASCULAR DISEASE   GERD   Hypothyroidism   AKI (acute kidney injury) ([place])   Pressure injury of skin   Generalized weakness   Acute metabolic encephalopathy   Leukocytosis   Hyponatremia   Dehydration   Hypoalbuminemia   Elevated brain natriuretic peptide (BNP) level   Paroxysmal A-fib ([facility])   Altered mental status   Septic infrapatellar bursitis of left knee   Infection of total knee replacement ([place])   Medication monitoring encounter     Vashti Hey, [facility]  If 7PM-7AM, please contact  night-coverage [email or web address]   LOS: 14 days

## 2021-03-11 NOTE — Progress Notes (Addendum)
Critical value reported to the attending page through [facility], , as lab called with a phosphorus level below 1.0 The attending called iin response to the page to confirm lab value

## 2021-03-12 DIAGNOSIS — F0390 Unspecified dementia without behavioral disturbance: Secondary | ICD-10-CM | POA: Diagnosis not present

## 2021-03-12 DIAGNOSIS — Z96659 Presence of unspecified artificial knee joint: Secondary | ICD-10-CM | POA: Diagnosis not present

## 2021-03-12 DIAGNOSIS — R1310 Dysphagia, unspecified: Secondary | ICD-10-CM | POA: Diagnosis not present

## 2021-03-12 DIAGNOSIS — N179 Acute kidney failure, unspecified: Secondary | ICD-10-CM | POA: Diagnosis not present

## 2021-03-12 DIAGNOSIS — T8459XS Infection and inflammatory reaction due to other internal joint prosthesis, sequela: Secondary | ICD-10-CM | POA: Diagnosis not present

## 2021-03-12 DIAGNOSIS — G9341 Metabolic encephalopathy: Secondary | ICD-10-CM | POA: Diagnosis not present

## 2021-03-12 DIAGNOSIS — R627 Adult failure to thrive: Secondary | ICD-10-CM | POA: Diagnosis not present

## 2021-03-12 LAB — PHOSPHORUS: Phosphorus: 1.7 mg/dL — ABNORMAL LOW (ref 2.5–4.6)

## 2021-03-12 LAB — GLUCOSE, CAPILLARY
Glucose-Capillary: 122 mg/dL — ABNORMAL HIGH (ref 70–99)
Glucose-Capillary: 134 mg/dL — ABNORMAL HIGH (ref 70–99)
Glucose-Capillary: 135 mg/dL — ABNORMAL HIGH (ref 70–99)
Glucose-Capillary: 138 mg/dL — ABNORMAL HIGH (ref 70–99)
Glucose-Capillary: 142 mg/dL — ABNORMAL HIGH (ref 70–99)
Glucose-Capillary: 145 mg/dL — ABNORMAL HIGH (ref 70–99)
Glucose-Capillary: 159 mg/dL — ABNORMAL HIGH (ref 70–99)

## 2021-03-12 LAB — VITAMIN D 25 HYDROXY (VIT D DEFICIENCY, FRACTURES): Vit D, 25-Hydroxy: 29.82 ng/mL — ABNORMAL LOW (ref 30–100)

## 2021-03-12 LAB — BASIC METABOLIC PANEL
Anion gap: 8 (ref 5–15)
BUN: 120 mg/dL — ABNORMAL HIGH (ref 8–23)
CO2: 26 mmol/L (ref 22–32)
Calcium: 8.6 mg/dL — ABNORMAL LOW (ref 8.9–10.3)
Chloride: 113 mmol/L — ABNORMAL HIGH (ref 98–111)
Creatinine, Ser: 2.9 mg/dL — ABNORMAL HIGH (ref 0.44–1.00)
GFR, Estimated: 15 mL/min — ABNORMAL LOW (ref >60)
Glucose, Bld: 168 mg/dL — ABNORMAL HIGH (ref 70–99)
Potassium: 4.5 mmol/L (ref 3.5–5.1)
Sodium: 147 mmol/L — ABNORMAL HIGH (ref 135–145)

## 2021-03-12 LAB — CK: Total CK: 72 U/L (ref 38–234)

## 2021-03-12 MED ORDER — OXYCODONE HCL 5 MG/5ML PO SOLN
2.5000 mg | ORAL | Status: DC | PRN
  Administered 2021-03-12 (×3): 2.5 mg
  Filled 2021-03-12 (×3): qty 5

## 2021-03-12 MED ORDER — FREE WATER
175.0000 mL | Status: DC
  Administered 2021-03-12 – 2021-03-14 (×7): 175 mL

## 2021-03-12 MED ORDER — OXYCODONE HCL 5 MG/5ML PO SOLN
5.0000 mg | Freq: Once | ORAL | Status: AC
Start: 2021-03-12 — End: 2021-03-12
  Administered 2021-03-12: 5 mg
  Filled 2021-03-12: qty 5

## 2021-03-12 MED ORDER — SODIUM BICARBONATE 650 MG PO TABS: 1300.0000 mg | ORAL_TABLET | Freq: Two times a day (BID) | ORAL | Status: DC

## 2021-03-12 MED ORDER — PROSOURCE TF PO LIQD
45.0000 mL | Freq: Two times a day (BID) | ORAL | Status: DC
  Administered 2021-03-12 – 2021-03-14 (×2): 45 mL
  Filled 2021-03-12 (×2): qty 45

## 2021-03-12 MED ORDER — SODIUM PHOSPHATES 45 MMOLE/15ML IV SOLN
15.0000 mmol | Freq: Once | INTRAVENOUS | Status: AC
  Administered 2021-03-12: 15 mmol via INTRAVENOUS
  Filled 2021-03-12: qty 5

## 2021-03-12 MED ORDER — PANTOPRAZOLE SODIUM 40 MG PO PACK
40.0000 mg | PACK | Freq: Two times a day (BID) | ORAL | Status: DC
  Administered 2021-03-12 – 2021-03-14 (×3): 40 mg
  Filled 2021-03-12 (×5): qty 20

## 2021-03-12 NOTE — Progress Notes (Addendum)
[name] NEPHROLOGY PROGRESS NOTE  Assessment/ Plan: Pt is a 87 y.o. yo female with HLD, PVD, A. fib admitted with prosthetic joint infection treated with vancomycin consulted for AKI.  # Acute kidney injury, nonoliguric: Baseline creatinine level around 0.8-1 but recently higher level.  AKI probably due to combination of multiple insults including sepsis, hypotension, tachycardia and vancomycin toxicity.  UA with chronic proteinuria, WBC and RBC.  Random vancomycin level was 35.  CT scan without any hydronephrosis or renal mass. Urine output is increasing and creatinine level 2.9 today.  Noted BUN is elevated however hard to assess uremic symptoms because of poor mental status.  I think patient's quality of life will not improve with dialysis therefore I would not offer dialysis.  Recommend palliative care discussion about goals of care.  Fortunately, no acute indication for dialysis at this time. I will order IV albumin.  #Metabolic acidosis improved.  Discontinue sodium bicarbonate because of mild hypernatremia.  #Periprosthetic joint infection: Currently on daptomycin and ceftriaxone per ID.  Patient will need 6 weeks of IV antibiotics.  #A. fib with RVR: Currently on diltiazem drip and metoprolol.  Monitor blood pressure.  Avoid hypotensive episode.  #Hypophosphatemia: Due to GI loss and poor intake.  I will check PTH and vitamin D level.  Continue to replete phosphorus.  # Anemia due to critical illness: Required blood transfusion.  Hemoglobin is stable.  #Mild hypernatremia: Free water deficit.  It seems like she has dysphagia diet.  Encourage oral intake of free water.  DC sodium bicarbonate.  #Acute metabolic encephalopathy  Subjective: Seen and examined.  Blood pressure soft.  Not really responding to the name.  Urine output is recorded around 700 cc. Objective Vital signs in last 24 hours: Vitals:   03/12/21 0436 03/12/21 0500 03/12/21 0600 03/12/21 0805  BP:   (!) 95/47 (!) 132/46 (!) 105/41  Pulse:  87 92   Resp:  18 (!) 23 (!) 21  Temp:    97.8 F (36.6 C)  TempSrc:    Axillary  SpO2:  100% 100%   Weight: 93 kg     Height:       Weight change: -1.5 kg  Intake/Output Summary (Last 24 hours) at 03/12/2021 0827 Last data filed at 03/12/2021 0529 Gross per 24 hour  Intake 852.32 ml  Output 710 ml  Net 142.32 ml       Labs: Basic Metabolic Panel: Recent Labs  Lab 03/10/21 0021 03/10/21 1900 03/11/21 0320 03/11/21 1654 03/12/21 0518  NA 144  --  146*  --  147*  K 3.4*  --  4.0  --  4.5  CL 113*  --  113*  --  113*  CO2 23  --  23  --  26  GLUCOSE 139*  --  184*  --  168*  BUN 83*  --  106*  --  120*  CREATININE 2.88*  --  2.78*  --  2.90*  CALCIUM 8.6*  --  8.8*  --  8.6*  PHOS 4.0   < > 1.8* <1.0* 1.7*   < > = values in this interval not displayed.   Liver Function Tests: Recent Labs  Lab 03/10/21 0945  AST 35  ALT 25  ALKPHOS 57  BILITOT 0.3  PROT 5.9*  ALBUMIN 1.9*   No results for input(s): LIPASE, AMYLASE in the last 168 hours. No results for input(s): AMMONIA in the last 168 hours. CBC: Recent Labs  Lab 03/06/21 0305 03/06/21  1010 03/07/21 0355 03/07/21 1830 03/08/21 0500 03/09/21 0430 03/10/21 0021  WBC 13.5*  --  13.6*  --  11.2* 12.2* 11.7*  NEUTROABS 10.9*  --  11.2*  --  9.6* 10.5* 10.0*  HGB 6.9*   < > 8.5*   < > 8.7* 8.8* 8.5*  HCT 21.6*   < > 26.5*   < > 27.2* 27.8* 26.8*  MCV 87.8  --  88.3  --  88.3 89.7 89.0  PLT 197  --  258  --  309 333 348   < > = values in this interval not displayed.   Cardiac Enzymes: Recent Labs  Lab 03/06/21 0305 03/12/21 0518  CKTOTAL 87 72   CBG: Recent Labs  Lab 03/11/21 0813 03/11/21 1637 03/11/21 2355 03/12/21 0332 03/12/21 0808  GLUCAP 191* 193* 147* 159* 138*    Iron Studies: No results for input(s): IRON, TIBC, TRANSFERRIN, FERRITIN in the last 72 hours. Studies/Results: No results found.  Medications: Infusions: . cefTRIAXone  (ROCEPHIN)  IV 2 g (03/11/21 2340)  . DAPTOmycin (CUBICIN)  IV 500 mg (03/11/21 2227)  . diltiazem (CARDIZEM) infusion Stopped (03/12/21 0818)  . feeding supplement (OSMOLITE 1.5 CAL) 1,000 mL (03/09/21 1746)  . sodium phosphate  Dextrose 5% IVPB      Scheduled Medications: . Chlorhexidine Gluconate Cloth  6 each Topical Daily  . cilostazol  100 mg Per Tube BID  . feeding supplement  237 mL Per Tube TID BM  . feeding supplement (PROSource TF)  45 mL Per Tube TID  . heparin injection (subcutaneous)  5,000 Units Subcutaneous Q12H  . levothyroxine  100 mcg Per Tube Q0600  . mouth rinse  15 mL Mouth Rinse BID  . metoprolol tartrate  25 mg Per Tube BID  . multivitamin with minerals  1 tablet Per Tube Daily  . nutrition supplement (JUVEN)  1 packet Per Tube BID BM  . pantoprazole (PROTONIX) IV  40 mg Intravenous Q12H  . sodium bicarbonate  1,300 mg Per Tube TID  . sodium chloride flush  10-40 mL Intracatheter Q12H  . vitamin B-12  1,000 mcg Per Tube Daily    have reviewed scheduled and prn medications.  Physical Exam: General: Critically ill looking female lying on bed, not responding to her name Heart:RRR, s1s2 nl, no rubs Lungs: Distant breath sound Abdomen:soft, Non-tender, non-distended Extremities:No edema Neurology: Not following commands.  Jaron Czarnecki Prasad Tiegan Jambor 03/12/2021,8:27 AM  LOS: 15 days

## 2021-03-12 NOTE — Progress Notes (Signed)
Cardizem drip received from nightshift infusing at 5 mg/hr. Last documentation in MAR from 04/24 at 1415 shows rate at 7.5 mg/hr. MAR updated to reflect correct rate. Unsure at what time medication was titrated to 5 mg/hr.

## 2021-03-12 NOTE — Progress Notes (Signed)
Physical Therapy Treatment Patient Details Name: [name] MRN: [id number] DOB: 08-27-1929 Today's Date: 03/12/2021    History of Present Illness [name] is a 87 y/o female who presented to ED at [facility] on 4/10 with complaints of weakness, decreased appeptite, and confusion. Pt found to have sepsis due to L leg cellulitis and septic knee. [facility] on 4/14. Was admitted on 01/14/21 for L distal femur fx repair and underwent L TKA. I&D with TKA resection on 02/16/2021. 4/19 Palliative consult with pt/family desiring full, aggressive care. 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    PT Comments    Patient lethargic and followed no commands x 4 extremities. No attempt to verbalize. Spoke with Caryl Pina, RN and she reports pt has not followed commands for her during past 3-4 shifts. Pt last seen by PT 4/20 when she was able to follow commands. Will monitor and assess ability to participate x 1 more visit. If pt remains unchanged, will discontinue PT. Currently not appropriate for skilled therapy at either SNF or LTACH.     Follow Up Recommendations  Supervision/Assistance - 24 hour     Equipment Recommendations  Hospital bed    Recommendations for Other Services       Precautions / Restrictions Precautions Precautions: Fall Precaution Comments: JP drain, L hemovac, L wound vac, watch BP (tends to run low), dementia, difficult to understand speech Required Braces or Orthoses: Knee Immobilizer - Left Knee Immobilizer - Left: On at all times Restrictions Other Position/Activity Restrictions: Knee immobilizer on at all times    Mobility  Bed Mobility                    Transfers                    Ambulation/Gait                 Stairs             Wheelchair Mobility    Modified Rankin (Stroke Patients Only)       Balance                                             Cognition Arousal/Alertness: Lethargic Behavior During Therapy: Flat affect Overall Cognitive Status: Difficult to assess                                 General Comments: Eyes open briefly. No attempts to verbalize and not following any commands      Exercises General Exercises - Upper Extremity Shoulder Flexion: Both;Supine;PROM;5 reps Elbow Flexion: Both;Supine;PROM;5 reps Elbow Extension: Both;Supine;PROM;5 reps Digit Composite Flexion: Both;Supine;PROM;5 reps General Exercises - Lower Extremity Ankle Circles/Pumps: Both;5 reps;PROM Heel Slides: PROM;Right;5 reps    General Comments        Pertinent Vitals/Pain Pain Assessment: Faces Faces Pain Scale: No hurt    Home Living                      Prior Function            PT Goals (current goals can now be found in the care plan section) Acute Rehab PT Goals Patient Stated Goal: none stated Progress towards  PT goals: Not progressing toward goals - comment    Frequency    Min 2X/week      PT Plan Discharge plan needs to be updated    Co-evaluation              AM-PAC PT "6 Clicks" Mobility   Outcome Measure  Help needed turning from your back to your side while in a flat bed without using bedrails?: Total Help needed moving from lying on your back to sitting on the side of a flat bed without using bedrails?: Total Help needed moving to and from a bed to a chair (including a wheelchair)?: Total Help needed standing up from a chair using your arms (e.g., wheelchair or bedside chair)?: Total Help needed to walk in hospital room?: Total Help needed climbing 3-5 steps with a railing? : Total 6 Click Score: 6    End of Session Equipment Utilized During Treatment: Left knee immobilizer Activity Tolerance: Patient limited by lethargy Patient left: in bed;with call bell/phone within reach;with bed alarm set Nurse Communication: Other (comment) (RN reports pt has not followed  commands for 3-4 shifts) PT Visit Diagnosis: Muscle weakness (generalized) (M62.81);Unsteadiness on feet (R26.81)     Time: 1610-9604 PT Time Calculation (min) (ACUTE ONLY): 15 min  Charges:  $Therapeutic Exercise: 8-22 mins                      Arby Barrette, PT Pager [phone]    Rexanne Mano 03/12/2021, 5:08 PM

## 2021-03-12 NOTE — Progress Notes (Signed)
Nutrition Follow-up  DOCUMENTATION CODES:   Obesity unspecified  INTERVENTION:  Continue via Cortrak: -Osmolite 1.5 @ 77m/hr (13248m -4555mrosource TF BID -175m93mee water Q4H -Juven per tube BID  -mvi per tube daily  TF provides 2060 kcals, 105g protein, 1006ml60me water (2056ml 61ml free water with flushes) Meets 100% of nutritional needs   -d/c Ensure -d/c Calorie count  NUTRITION DIAGNOSIS:   Increased nutrient needs related to wound healing (3 stage 4 PI and a DTI to left heel) as evidenced by estimated needs.  ongoing  GOAL:   Patient will meet greater than or equal to 90% of their needs  Met with TF  MONITOR:   PO intake,Supplement acceptance,Labs,Skin,Weight trends  REASON FOR ASSESSMENT:   Consult Enteral/tube feeding initiation and management,Calorie Count  ASSESSMENT:   Patient is a 87 yo 62male with presents with multiple wounds chronic stage 4 PI x 2, right ankle stage 4,  left heel DTI. History of PVD, HTN, hypothyroidism.   04/11 diet advanced to dysphagia 2 with thin liquids 04/14 diet changed to soft; s/p L knee aspiration and injection 04/16 s/p I&D L total knee arthroplasty with radical synovectomy and application of negative pressure incisional dressingdiet downgraded to fulls then advanced to heart healthy 04/17 diet downgraded to dysphagia 1 with thin liquids  04/18 5% albumin given 04/22 cortrak placed (gastric tip)  Pt noted to have nonoliguric AKI, likely due to combination of sepsis, hypotension, tachycardia, and vancomycin toxicity per Nephrology. Baseline Cr 0.8-1, today it is 2.9. Per Nephrology, pt is a poor candidate for dialysis given it is unlikely to improve pt's quality of life. Nephrology recommending further discussions [place].   RD went to follow-up on calorie count and discussed pt with RN. Per RN, pt is more lethargic this week than last. Note pt without any PO intake over weekend aside from  occasional sips/bites, insufficient to meet needs. Will d/c oral nutrition supplements and ensure TF is meeting 100% of pt's needs unless GOC are changed. Pt tolerating the following via Cortrak: Osmolite 1.5 @ 55ml/h106m5ml pr28mrce TF TID  UOP: 700ml x2452mrs JP drain: 10ml outp22m24 hours  Medications: mvi with minerals, juven BID, protonix, vitamin B12, IV abx, IV sodium phosphate 15mmol x1 38m: Na 147 (H), PO4 1.7 (L) CBGs [id number]:   Diet Order            DIET - DYS 1 Room service appropriate? No; Fluid consistency: Thin  Diet effective now                 EDUCATION NEEDS:   Education needs have been addressed  Skin:  Skin Assessment: Skin Integrity Issues: Skin Integrity Issues:: Stage IV Stage IV: 2 sacral stge 4 and a stage 4 to right ankle  Last BM:  4/23 type 7  Height:   Ht Readings from Last 1 Encounters:  02/26/21 5' 4.02" (1.626 m)    Weight:   Wt Readings from Last 1 Encounters:  03/12/21 93 kg    BMI:  Body mass index is 35.18 kg/m.  Estimated Nutritional Needs:   Kcal:  1952-2135  0938-1829 103-110 gr  Fluid:  >1500 ml daily    Tasnia Spegal AverLarkin InaDN RD pager number and weekend/on-call pager number located in [place]

## 2021-03-12 NOTE — Progress Notes (Signed)
Notified MD of large liquid stool, orders received.

## 2021-03-12 NOTE — Progress Notes (Signed)
    Progress Note from the Palliative Medicine Team at [facility]   Patient Name: [name]       Date: 03/12/2021 DOB: 11/15/1929  Age: 87 y.o. MRN#: [id number] Attending Physician: Aline August, MD Primary Care Physician: Rosita Fire, MD Admit Date: 02/24/2021   Medical records reviewed   Per intake H&P --> 87 year old female with a PMH of Chronic Dementia, Peripheral Vascular Disease, and multiple medical problems presented to [facility] ED on 4/11 with confusion, weakness, mild AKI (Creatinine 2.53 with baseline ~ 1 mg/dL) and acute swelling and redness of the left knee. Left Knee Arthrocentesis performed by Ortho followed by a left Total Knee Arthroplasty with Radical Synovectomy on 4/16.  Today is day 14 of this hospitalization  Palliative care was asked to get involved to further address goals of care in the setting of ongoing infection(s).   Family face treatment option decisions, advanced directive decisions and anticipatory care needs.  This NP visited patient at the bedside as a follow up for palliative medicine needs and emotional support.    I met again today with patient's daughters, Corliss Skains and Leodis Liverpool  to continue discussion regarding diagnosis, treatment option decisions,  prognosis, GOC, EOL wishes, disposition and options.    (Patient has no documented H POA, both daughters act together to make decisions in the patient's best interest)  Continued conversation regarding the seriousness of Ms. Sennett's current medical situation; sepsis,  worsening renal function, stage IV sacral wounds, dysphagia and poor oral intake, acute anemia requiring transfusion, dementia based dysphagia  and  overall failure to thrive.  Core track was placed on Friday for nutritional support.  Unfortunately today the patient is minimally responsive.  She was requiring a nonrebreather to maintain O2 sats.  Education offered on the natural trajectory of dementia specific  to weakness, dysphagia and associated aspiration.  Created space and opportunity for family to explore the thoughts and feelings regarding current medical situation.  Both acknowledge the seriousness of their mother's current medical situation but at this time remain hopeful for improvement.  Education and conversation specific the concept of adult failure to thrive.  We discussed limitations of medical interventions to prolong quality of life when the body does fail to thrive.     I discussed with daughters the importance of considering best case scenario versus worst-case scenario and anticipatory care needs and decisions.   Encouraged family to consider DNR/DNI status understanding evidenced based poor outcomes in similar hospitalized patient, as the cause of arrest is likely associated with advanced chronic illness rather than an easily reversible acute cardio-pulmonary event.  Both  daughters verbalized their openness to all offered and available medical interventions to prolong life.  Discussed with sisters  the importance of continued conversation with each other and the  medical providers regarding overall plan of care and treatment options,  ensuring decisions are within the context of the patients values and GOCs.  Questions and concerns addressed   Discussed with bedside RN  PMT will continue to support holistically  Total time spent on the unit was 35 minutes  Greater than 50% of the time was spent in counseling and coordination of care  Wadie Lessen NP  Palliative Medicine Team Team Phone # [phone] Pager [phone]

## 2021-03-12 NOTE — Progress Notes (Signed)
Patient ID: [name], female   DOB: 06/30/1929, 87 y.o.   MRN: [id number]  PROGRESS NOTE    [name]  FIE:[id number] DOB: February 05, 1929 DOA: 03/01/2021 PCP: Rosita Fire, MD   Brief Narrative:  87 year old female with history of chronic dementia, peripheral vascular disease, left periprosthetic femur fracture in 12/2020 requiring left femur replacement, chronic diastolic heart failure, paroxysmal A. fib, aphasia presented with acute metabolic encephalopathy and acute on chronic kidney injury.  Work-up revealed periprosthetic joint infection and stage IV sacral decubitus ulcers.  She was started on vancomycin and Rocephin and underwent washout in the OR by orthopedics.  ID was consulted and patient was switched to Rocephin and daptomycin.  Hospital course has been complicated by worsening kidney function and oliguria.  Nephrology has been consulted.  Palliative care also has been consulted for goals of care discussion but patient remains full code as family seems to be unrealistic in the terms of treatment outcomes.  Assessment & Plan:   Left knee periprosthetic joint infection -Status post resection arthroplasty and radical synovectomy. -Currently on daptomycin and Rocephin as per ID recommendations: Will need at least 6 weeks of IV therapy followed by oral drug therapy if family continue to pursue aggressive treatment although ID is also recommending palliative approach.  Acute kidney injury -Baseline creatinine of 0.8-1.   -Nephrology following.  Creatinine 2.9 today. -Patient is not a candidate for dialysis as per nephrology.  Nephrology is also recommending palliative care discussions -CT scan was without any hydronephrosis or renal mass  Paroxysmal A. fib with RVR -Currently on Cardizem drip.  Heart rate improved.  Blood pressure on the lower side.  Will DC Cardizem drip.  Can use metoprolol if blood pressure allows. -Not on anticoagulation  Ankle decubitus with osteomyelitis  with known PVD -MRI of the ankle shows osteomyelitis -As noted above we will continue daptomycin and ceftriaxone -Definitive treatment would require BKA -Unclear if patient would tolerate surgery -Continue Pletal  Sacral decubitus with OM of distal sacral and coccyx -OM of distal sacral and coccyx is a very poor prognostic indicator -Continue wound management and antibiotics as above -likelihood of this healing is very low given multiple morbidities  Goals for care -Palliative care team has had discussion with patient's family members. -Family continues to want aggressive care despite recommendations to the contrary Core track was placed for tube feeds -TOC are involved for SNF/LTAC placement upon discharge when appropriate -Overall prognosis is very poor.  Recommend hospice/total comfort measures.  Anemia of chronic disease -Has required transfusion during the hospitalization.  Hemoglobin currently stable.  No overt signs of acute blood loss currently. -GI has evaluated the patient during the hospitalization and did not recommend endoscopic evaluation   Acute metabolic encephalopathy History of dementia -Mental status fluctuating with episodes of agitation.  Monitor mental status. -Fall precautions.  HTN -Blood pressure on the lower side.  Continue metoprolol if blood pressure allows  Chronic diastolic heart failure -Strict input and output.  Daily weights.  Continue metoprolol  Hypothyroidism Continue Synthroid  Severe protein calorie malnutrition -Follow nutrition recommendations and tube feeding  Neurological oropharyngeal dysphagia -Continue cortrak tube feeds for now  Various pressure ulcers as described below: Present on admission -Follow wound care recommendations Pressure Injury 03/10/2021 Sacrum Mid Stage 4 - Full thickness tissue loss with exposed bone, tendon or muscle. tunneling (Active)  03/04/2021 2105  Location: Sacrum  Location Orientation: Mid   Staging: Stage 4 - Full thickness tissue loss with exposed bone, tendon  or muscle.  Wound Description (Comments): tunneling  Present on Admission: Yes     Pressure Injury Mar 07, 2021 Sacrum Mid Stage 4 - Full thickness tissue loss with exposed bone, tendon or muscle. tunneling (Active)  2021/03/07 2106  Location: Sacrum  Location Orientation: Mid  Staging: Stage 4 - Full thickness tissue loss with exposed bone, tendon or muscle.  Wound Description (Comments): tunneling  Present on Admission: Yes     Pressure Injury 02/26/21 Ankle Right;Lateral Stage 4 - Full thickness tissue loss with exposed bone, tendon or muscle. (Active)  02/26/21 0202  Location: Ankle  Location Orientation: Right;Lateral  Staging: Stage 4 - Full thickness tissue loss with exposed bone, tendon or muscle.  Wound Description (Comments):   Present on Admission:      Pressure Injury 02/26/21 Foot Left;Lateral Deep Tissue Pressure Injury - Purple or maroon localized area of discolored intact skin or blood-filled blister due to damage of underlying soft tissue from pressure and/or shear. (Active)  02/26/21 0158  Location: Foot  Location Orientation: Left;Lateral  Staging: Deep Tissue Pressure Injury - Purple or maroon localized area of discolored intact skin or blood-filled blister due to damage of underlying soft tissue from pressure and/or shear.  Wound Description (Comments):   Present on Admission: Yes     DVT prophylaxis: Subcutaneous heparin Code Status: Full Family Communication: None at bedside Disposition Plan: Status is: Inpatient  Remains inpatient appropriate because:Inpatient level of care appropriate due to severity of illness.  Patient continues to need IV antibiotics with ongoing renal failure   Dispo: The patient is from: Home              Anticipated d/c is to: To be decided              Patient currently is not medically stable to d/c.   Difficult to place patient No  Consultants:  Nephrology/ID/palliative care/orthopedics/GI  Procedures: Resection arthroplasty and radical synovectomy on 03/17/2021  Antimicrobials:  Anti-infectives (From admission, onward)   Start     Dose/Rate Route Frequency Ordered Stop   03/05/21 2000  DAPTOmycin (CUBICIN) 500 mg in sodium chloride 0.9 % IVPB        500 mg 120 mL/hr over 30 Minutes Intravenous Every 48 hours 02/23/2021 1517     02/16/2021 2300  cefTRIAXone (ROCEPHIN) 2 g in sodium chloride 0.9 % 100 mL IVPB        2 g 200 mL/hr over 30 Minutes Intravenous Every 24 hours 03/16/2021 2236     03/17/2021 2000  DAPTOmycin (CUBICIN) 500 mg in sodium chloride 0.9 % IVPB  Status:  Discontinued        8 mg/kg  61.3 kg 120 mL/hr over 30 Minutes Intravenous Every 48 hours 03/02/21 0855 02/23/2021 1320   02/18/2021 1500  DAPTOmycin (CUBICIN) 600 mg in sodium chloride 0.9 % IVPB  Status:  Discontinued        6 mg/kg  99 kg 124 mL/hr over 30 Minutes Intravenous Daily 02/17/2021 1400 02/21/2021 1402   03/08/2021 1500  DAPTOmycin (CUBICIN) 500 mg in sodium chloride 0.9 % IVPB  Status:  Discontinued        500 mg 120 mL/hr over 30 Minutes Intravenous Daily 02/20/2021 1405 03/11/2021 1517   03/02/2021 1045  tobramycin (NEBCIN) powder  Status:  Discontinued          As needed 02/17/2021 1045 03/11/2021 1201   03/06/2021 1037  vancomycin (VANCOCIN) powder  Status:  Discontinued  As needed 02/24/2021 1044 02/25/2021 1201   02/23/2021 0800  vancomycin (VANCOCIN) IVPB 1000 mg/200 mL premix        1,000 mg 200 mL/hr over 60 Minutes Intravenous On call to O.R. 03/14/2021 0752 02/18/2021 1040   03/02/21 2200  cefTRIAXone (ROCEPHIN) injection 2 g  Status:  Discontinued        2 g Intramuscular Every 24 hours 03/02/21 0904 02/16/2021 2235   03/02/21 0600  ceFAZolin (ANCEF) IVPB 2g/100 mL premix  Status:  Discontinued        2 g 200 mL/hr over 30 Minutes Intravenous On call to O.R. 03/02/21 0110 03/12/2021 0559   02/27/21 2200  vancomycin (VANCOREADY) IVPB 750 mg/150 mL  Status:   Discontinued        750 mg 150 mL/hr over 60 Minutes Intravenous Every 48 hours 03/07/2021 2342 03/02/21 0855   03/16/2021 2315  vancomycin (VANCOCIN) IVPB 1000 mg/200 mL premix        1,000 mg 200 mL/hr over 60 Minutes Intravenous  Once 02/23/2021 2313 02/26/21 0115   02/21/2021 2315  ceFEPIme (MAXIPIME) 2 g in sodium chloride 0.9 % 100 mL IVPB  Status:  Discontinued        2 g 200 mL/hr over 30 Minutes Intravenous Every 24 hours 03/14/2021 2313 03/02/21 0904       Subjective: Patient seen and examined at bedside.  Hardly wakes up on calling her name or follows commands.  No overnight fever or vomiting reported.  Objective: Vitals:   03/12/21 0500 03/12/21 0600 03/12/21 0805 03/12/21 1219  BP: (!) 95/47 (!) 132/46 (!) 105/41 (!) 112/45  Pulse: 87 92    Resp: 18 (!) 23 (!) 21   Temp:   97.8 F (36.6 C) 98.5 F (36.9 C)  TempSrc:   Axillary Axillary  SpO2: 100% 100%    Weight:      Height:        Intake/Output Summary (Last 24 hours) at 03/12/2021 1413 Last data filed at 03/12/2021 0908 Gross per 24 hour  Intake 1102.32 ml  Output 710 ml  Net 392.32 ml   Filed Weights   03/02/21 1024 03/11/21 0500 03/12/21 0436  Weight: 99 kg 94.5 kg 93 kg    Examination:  General exam: Chronically ill looking elderly female lying in bed.  Was on Ventimask at the time of my evaluation Respiratory system: Bilateral decreased breath sounds at bases with scattered crackles; intermittently tachypneic Cardiovascular system: S1 & S2 heard, Rate controlled Gastrointestinal system: Abdomen is nondistended, soft and nontender. Normal bowel sounds heard. Extremities: No cyanosis, clubbing; lower extremity edema present Central nervous system: Hardly wakes up and follows commands. Skin: No obvious ecchymosis/petechiae Psychiatry: Could not be assessed because of mental status    Data Reviewed: I have personally reviewed following labs and imaging studies  CBC: Recent Labs  Lab 03/06/21 0305  03/06/21 1010 03/07/21 0355 03/07/21 1830 03/08/21 0500 03/09/21 0430 03/10/21 0021  WBC 13.5*  --  13.6*  --  11.2* 12.2* 11.7*  NEUTROABS 10.9*  --  11.2*  --  9.6* 10.5* 10.0*  HGB 6.9*   < > 8.5* 8.9* 8.7* 8.8* 8.5*  HCT 21.6*   < > 26.5* 27.5* 27.2* 27.8* 26.8*  MCV 87.8  --  88.3  --  88.3 89.7 89.0  PLT 197  --  258  --  309 333 348   < > = values in this interval not displayed.   Basic Metabolic Panel: Recent Labs  Lab  03/08/21 0500 03/09/21 0430 03/09/21 0430 03/09/21 1600 03/10/21 0021 03/10/21 1900 03/11/21 0320 03/11/21 1654 03/12/21 0518  NA 143 145  --   --  144  --  146*  --  147*  K 3.6 3.6  --   --  3.4*  --  4.0  --  4.5  CL 113* 114*  --   --  113*  --  113*  --  113*  CO2 22 23  --   --  23  --  23  --  26  GLUCOSE 73 67*  --   --  139*  --  184*  --  168*  BUN 75* 73*  --   --  83*  --  106*  --  120*  CREATININE 2.89* 2.74*  --   --  2.88*  --  2.78*  --  2.90*  CALCIUM 8.9 8.9  --   --  8.6*  --  8.8*  --  8.6*  MG  --  2.0   < > 2.0 1.9 1.8 1.8 1.8  --   PHOS  --  4.5   < > 4.4 4.0 2.3* 1.8* <1.0* 1.7*   < > = values in this interval not displayed.   GFR: Estimated Creatinine Clearance: 14.5 mL/min (A) (by C-G formula based on SCr of 2.9 mg/dL (H)). Liver Function Tests: Recent Labs  Lab 03/10/21 0945  AST 35  ALT 25  ALKPHOS 57  BILITOT 0.3  PROT 5.9*  ALBUMIN 1.9*   No results for input(s): LIPASE, AMYLASE in the last 168 hours. No results for input(s): AMMONIA in the last 168 hours. Coagulation Profile: No results for input(s): INR, PROTIME in the last 168 hours. Cardiac Enzymes: Recent Labs  Lab 03/06/21 0305 03/12/21 0518  CKTOTAL 87 72   BNP (last 3 results) No results for input(s): PROBNP in the last 8760 hours. HbA1C: No results for input(s): HGBA1C in the last 72 hours. CBG: Recent Labs  Lab 03/11/21 1637 03/11/21 2355 03/12/21 0332 03/12/21 0808 03/12/21 1217  GLUCAP 193* 147* 159* 138* 145*   Lipid  Profile: No results for input(s): CHOL, HDL, LDLCALC, TRIG, CHOLHDL, LDLDIRECT in the last 72 hours. Thyroid Function Tests: No results for input(s): TSH, T4TOTAL, FREET4, T3FREE, THYROIDAB in the last 72 hours. Anemia Panel: No results for input(s): VITAMINB12, FOLATE, FERRITIN, TIBC, IRON, RETICCTPCT in the last 72 hours. Sepsis Labs: No results for input(s): PROCALCITON, LATICACIDVEN in the last 168 hours.  No results found for this or any previous visit (from the past 240 hour(s)).       Radiology Studies: No results found.      Scheduled Meds: . Chlorhexidine Gluconate Cloth  6 each Topical Daily  . cilostazol  100 mg Per Tube BID  . feeding supplement  237 mL Per Tube TID BM  . feeding supplement (PROSource TF)  45 mL Per Tube TID  . heparin injection (subcutaneous)  5,000 Units Subcutaneous Q12H  . levothyroxine  100 mcg Per Tube Q0600  . mouth rinse  15 mL Mouth Rinse BID  . metoprolol tartrate  25 mg Per Tube BID  . multivitamin with minerals  1 tablet Per Tube Daily  . nutrition supplement (JUVEN)  1 packet Per Tube BID BM  . pantoprazole sodium  40 mg Per Tube BID  . sodium chloride flush  10-40 mL Intracatheter Q12H  . vitamin B-12  1,000 mcg Per Tube Daily   Continuous Infusions: . cefTRIAXone (ROCEPHIN)  IV 2 g (03/11/21 2340)  . DAPTOmycin (CUBICIN)  IV 500 mg (03/11/21 2227)  . diltiazem (CARDIZEM) infusion Stopped (03/12/21 0818)  . feeding supplement (OSMOLITE 1.5 CAL) 1,000 mL (03/12/21 1339)  . sodium phosphate  Dextrose 5% IVPB 15 mmol (03/12/21 0911)          Aline August, MD [facility] 03/12/2021, 2:13 PM

## 2021-03-13 ENCOUNTER — Inpatient Hospital Stay (HOSPITAL_COMMUNITY): Payer: Medicare Other

## 2021-03-13 DIAGNOSIS — A419 Sepsis, unspecified organism: Secondary | ICD-10-CM | POA: Diagnosis not present

## 2021-03-13 DIAGNOSIS — J9601 Acute respiratory failure with hypoxia: Secondary | ICD-10-CM | POA: Diagnosis not present

## 2021-03-13 DIAGNOSIS — R4182 Altered mental status, unspecified: Secondary | ICD-10-CM | POA: Diagnosis not present

## 2021-03-13 DIAGNOSIS — N179 Acute kidney failure, unspecified: Secondary | ICD-10-CM | POA: Diagnosis not present

## 2021-03-13 DIAGNOSIS — Z515 Encounter for palliative care: Secondary | ICD-10-CM | POA: Diagnosis not present

## 2021-03-13 LAB — CBC WITH DIFFERENTIAL/PLATELET
Abs Immature Granulocytes: 0.1 10*3/uL — ABNORMAL HIGH (ref 0.00–0.07)
Abs Immature Granulocytes: 0.27 10*3/uL — ABNORMAL HIGH (ref 0.00–0.07)
Basophils Absolute: 0.1 10*3/uL (ref 0.0–0.1)
Basophils Absolute: 0 10*3/uL (ref 0.0–0.1)
Basophils Relative: 0 %
Basophils Relative: 0 %
Eosinophils Absolute: 0.3 10*3/uL (ref 0.0–0.5)
Eosinophils Absolute: 0.2 10*3/uL (ref 0.0–0.5)
Eosinophils Relative: 2 %
Eosinophils Relative: 2 %
HCT: 26.5 % — ABNORMAL LOW (ref 36.0–46.0)
HCT: 28.4 % — ABNORMAL LOW (ref 36.0–46.0)
Hemoglobin: 8.2 g/dL — ABNORMAL LOW (ref 12.0–15.0)
Hemoglobin: 8.4 g/dL — ABNORMAL LOW (ref 12.0–15.0)
Immature Granulocytes: 1 %
Immature Granulocytes: 2 %
Lymphocytes Relative: 4 %
Lymphocytes Relative: 17 %
Lymphs Abs: 0.6 10*3/uL — ABNORMAL LOW (ref 0.7–4.0)
Lymphs Abs: 2 10*3/uL (ref 0.7–4.0)
MCH: 28.6 pg (ref 26.0–34.0)
MCH: 28.7 pg (ref 26.0–34.0)
MCHC: 30.9 g/dL (ref 30.0–36.0)
MCHC: 29.6 g/dL — ABNORMAL LOW (ref 30.0–36.0)
MCV: 92.3 fL (ref 80.0–100.0)
MCV: 96.9 fL (ref 80.0–100.0)
Monocytes Absolute: 0.6 10*3/uL (ref 0.1–1.0)
Monocytes Absolute: 0.3 10*3/uL (ref 0.1–1.0)
Monocytes Relative: 4 %
Monocytes Relative: 3 %
Neutro Abs: 12.7 10*3/uL — ABNORMAL HIGH (ref 1.7–7.7)
Neutro Abs: 9.1 10*3/uL — ABNORMAL HIGH (ref 1.7–7.7)
Neutrophils Relative %: 89 %
Neutrophils Relative %: 76 %
Platelets: 352 10*3/uL (ref 150–400)
Platelets: 355 10*3/uL (ref 150–400)
RBC: 2.87 MIL/uL — ABNORMAL LOW (ref 3.87–5.11)
RBC: 2.93 MIL/uL — ABNORMAL LOW (ref 3.87–5.11)
RDW: 20.3 % — ABNORMAL HIGH (ref 11.5–15.5)
RDW: 20.6 % — ABNORMAL HIGH (ref 11.5–15.5)
WBC: 14.4 10*3/uL — ABNORMAL HIGH (ref 4.0–10.5)
WBC: 11.9 10*3/uL — ABNORMAL HIGH (ref 4.0–10.5)
nRBC: 0 % (ref 0.0–0.2)
nRBC: 0.3 % — ABNORMAL HIGH (ref 0.0–0.2)

## 2021-03-13 LAB — COMPREHENSIVE METABOLIC PANEL
ALT: 69 U/L — ABNORMAL HIGH (ref 0–44)
ALT: 117 U/L — ABNORMAL HIGH (ref 0–44)
AST: 92 U/L — ABNORMAL HIGH (ref 15–41)
AST: 185 U/L — ABNORMAL HIGH (ref 15–41)
Albumin: 1.9 g/dL — ABNORMAL LOW (ref 3.5–5.0)
Albumin: 1.8 g/dL — ABNORMAL LOW (ref 3.5–5.0)
Alkaline Phosphatase: 102 U/L (ref 38–126)
Alkaline Phosphatase: 106 U/L (ref 38–126)
Anion gap: 7 (ref 5–15)
Anion gap: 14 (ref 5–15)
BUN: 141 mg/dL — ABNORMAL HIGH (ref 8–23)
BUN: 136 mg/dL — ABNORMAL HIGH (ref 8–23)
CO2: 26 mmol/L (ref 22–32)
CO2: 24 mmol/L (ref 22–32)
Calcium: 8.4 mg/dL — ABNORMAL LOW (ref 8.9–10.3)
Calcium: 12 mg/dL — ABNORMAL HIGH (ref 8.9–10.3)
Chloride: 108 mmol/L (ref 98–111)
Chloride: 108 mmol/L (ref 98–111)
Creatinine, Ser: 2.91 mg/dL — ABNORMAL HIGH (ref 0.44–1.00)
Creatinine, Ser: 2.97 mg/dL — ABNORMAL HIGH (ref 0.44–1.00)
GFR, Estimated: 15 mL/min — ABNORMAL LOW (ref >60)
GFR, Estimated: 15 mL/min — ABNORMAL LOW (ref >60)
Glucose, Bld: 157 mg/dL — ABNORMAL HIGH (ref 70–99)
Glucose, Bld: 206 mg/dL — ABNORMAL HIGH (ref 70–99)
Potassium: 5.3 mmol/L — ABNORMAL HIGH (ref 3.5–5.1)
Potassium: 6.3 mmol/L (ref 3.5–5.1)
Sodium: 141 mmol/L (ref 135–145)
Sodium: 146 mmol/L — ABNORMAL HIGH (ref 135–145)
Total Bilirubin: 0.9 mg/dL (ref 0.3–1.2)
Total Bilirubin: 0.7 mg/dL (ref 0.3–1.2)
Total Protein: 5.8 g/dL — ABNORMAL LOW (ref 6.5–8.1)
Total Protein: 5.3 g/dL — ABNORMAL LOW (ref 6.5–8.1)

## 2021-03-13 LAB — POCT I-STAT 7, (LYTES, BLD GAS, ICA,H+H)
Acid-base deficit: 8 mmol/L — ABNORMAL HIGH (ref 0.0–2.0)
Bicarbonate: 19.2 mmol/L — ABNORMAL LOW (ref 20.0–28.0)
Calcium, Ion: 1.41 mmol/L — ABNORMAL HIGH (ref 1.15–1.40)
HCT: 24 % — ABNORMAL LOW (ref 36.0–46.0)
Hemoglobin: 8.2 g/dL — ABNORMAL LOW (ref 12.0–15.0)
O2 Saturation: 59 %
Potassium: 6.2 mmol/L — ABNORMAL HIGH (ref 3.5–5.1)
Sodium: 141 mmol/L (ref 135–145)
TCO2: 21 mmol/L — ABNORMAL LOW (ref 22–32)
pCO2 arterial: 48.1 mmHg — ABNORMAL HIGH (ref 32.0–48.0)
pH, Arterial: 7.21 — ABNORMAL LOW (ref 7.350–7.450)
pO2, Arterial: 38 mmHg — CL (ref 83.0–108.0)

## 2021-03-13 LAB — HEMOGLOBIN A1C
Hgb A1c MFr Bld: 5.2 % (ref 4.8–5.6)
Mean Plasma Glucose: 102.54 mg/dL

## 2021-03-13 LAB — GLUCOSE, CAPILLARY
Glucose-Capillary: 129 mg/dL — ABNORMAL HIGH (ref 70–99)
Glucose-Capillary: 131 mg/dL — ABNORMAL HIGH (ref 70–99)
Glucose-Capillary: 152 mg/dL — ABNORMAL HIGH (ref 70–99)
Glucose-Capillary: 200 mg/dL — ABNORMAL HIGH (ref 70–99)
Glucose-Capillary: 65 mg/dL — ABNORMAL LOW (ref 70–99)
Glucose-Capillary: 70 mg/dL (ref 70–99)

## 2021-03-13 LAB — MAGNESIUM
Magnesium: 1.7 mg/dL (ref 1.7–2.4)
Magnesium: 2.2 mg/dL (ref 1.7–2.4)

## 2021-03-13 LAB — PARATHYROID HORMONE, INTACT (NO CA): PTH: 37 pg/mL (ref 15–65)

## 2021-03-13 LAB — BASIC METABOLIC PANEL
Anion gap: 11 (ref 5–15)
BUN: 138 mg/dL — ABNORMAL HIGH (ref 8–23)
CO2: 27 mmol/L (ref 22–32)
Calcium: 8.7 mg/dL — ABNORMAL LOW (ref 8.9–10.3)
Chloride: 103 mmol/L (ref 98–111)
Creatinine, Ser: 2.95 mg/dL — ABNORMAL HIGH (ref 0.44–1.00)
GFR, Estimated: 15 mL/min — ABNORMAL LOW (ref >60)
Glucose, Bld: 96 mg/dL (ref 70–99)
Potassium: 5.5 mmol/L — ABNORMAL HIGH (ref 3.5–5.1)
Sodium: 141 mmol/L (ref 135–145)

## 2021-03-13 LAB — LACTIC ACID, PLASMA: Lactic Acid, Venous: 8.3 mmol/L (ref 0.5–1.9)

## 2021-03-13 LAB — PHOSPHORUS: Phosphorus: 2.3 mg/dL — ABNORMAL LOW (ref 2.5–4.6)

## 2021-03-13 MED ORDER — DEXTROSE 50 % IV SOLN: 12.5000 g | INTRAVENOUS | Status: AC

## 2021-03-13 MED ORDER — METOPROLOL TARTRATE 12.5 MG HALF TABLET: 12.5000 mg | ORAL_TABLET | Freq: Two times a day (BID) | ORAL | Status: DC

## 2021-03-13 MED ORDER — LEVOTHYROXINE SODIUM 75 MCG PO TABS: 75.0000 ug | ORAL_TABLET | Freq: Every day | ORAL | Status: DC

## 2021-03-13 MED ORDER — INSULIN ASPART 100 UNIT/ML ~~LOC~~ SOLN
0.0000 [IU] | SUBCUTANEOUS | Status: DC
  Administered 2021-03-13 (×2): 1 [IU] via SUBCUTANEOUS

## 2021-03-13 MED ORDER — SODIUM BICARBONATE 8.4 % IV SOLN
INTRAVENOUS | Status: AC
  Administered 2021-03-13: 50 meq via INTRAVENOUS
  Filled 2021-03-13: qty 50

## 2021-03-13 MED ORDER — NOREPINEPHRINE 4 MG/250ML-% IV SOLN
INTRAVENOUS | Status: AC
  Administered 2021-03-13: 30 ug/min via INTRAVENOUS
  Filled 2021-03-13: qty 250

## 2021-03-13 MED ORDER — SODIUM CHLORIDE 0.9 % IV SOLN: 250.0000 mL | INTRAVENOUS | Status: DC

## 2021-03-13 MED ORDER — NOREPINEPHRINE 4 MG/250ML-% IV SOLN
2.0000 ug/min | INTRAVENOUS | Status: DC
Start: 2021-03-13 — End: 2021-03-13
  Filled 2021-03-13: qty 250

## 2021-03-13 MED ORDER — SODIUM ZIRCONIUM CYCLOSILICATE 10 G PO PACK
10.0000 g | PACK | Freq: Three times a day (TID) | ORAL | Status: AC
  Administered 2021-03-13 (×3): 10 g
  Filled 2021-03-13 (×3): qty 1

## 2021-03-13 MED ORDER — NOREPINEPHRINE 4 MG/250ML-% IV SOLN
0.0000 ug/min | INTRAVENOUS | Status: DC
Start: 2021-03-13 — End: 2021-03-15
  Administered 2021-03-13: 7 ug/min via INTRAVENOUS
  Filled 2021-03-13: qty 250

## 2021-03-13 MED ORDER — SODIUM BICARBONATE 8.4 % IV SOLN: 50.0000 meq | Freq: Once | INTRAVENOUS | Status: AC

## 2021-03-13 NOTE — Progress Notes (Signed)
   03/13/21 0648  Clinical Encounter Type  Visited With Patient not available  Visit Type Code  Referral From Nurse  Consult/Referral To Chaplain  Chaplain responded to code blue. The patient family was not present. Chaplain not needed at this time. Chaplain checked in on staff who provided care to the patient during crisis. Chaplain remains available if needed. This note was prepared by Jeanine Luz, M.Div..  For questions please contact by phone [phone].

## 2021-03-13 NOTE — Progress Notes (Signed)
Received call from central telemetry about pt desatting. Upon arrival to pt's room, pt 02 sat in the 50s on venti mask @ 28%. Sats continued to drop and pt began to brady down. Hospitalist paged through [facility] and RT notified to come to pt's room stat. 02 increased by this RN.   RT at bedside, and code called at [phone]. Pt pulseless, chest compressions started and code called. See code flow sheet.  Pt transferred to ICU and care relinquished to ICU RN. Family notified by physician.

## 2021-03-13 NOTE — Procedures (Signed)
Intubation Procedure Note  [name]  [id number]  November 29, 1928  Date:03/13/21  Time:7:04 AM   Provider Performing:Leanda Padmore, Belva Chimes A    Procedure: Intubation (86578)  Indication(s) Respiratory Failure  Consent Unable to obtain consent due to emergent nature of procedure.   Anesthesia no pulse/heart rate   Time Out Verified patient identification, verified procedure, site/side was marked, verified correct patient position, special equipment/implants available, medications/allergies/relevant history reviewed, required imaging and test results available.   Sterile Technique Usual hand hygeine, masks, and gloves were used   Procedure Description Patient positioned in bed supine.  Sedation given as noted above.  Patient was intubated with endotracheal tube using MAC 3.  View was Grade 1 full glottis .  Number of attempts was 1.  Colorimetric CO2 detector was consistent with tracheal placement.   Complications/Tolerance None; patient tolerated the procedure well. Chest X-ray is ordered to verify placement.   EBL Minimal   Specimen(s) None

## 2021-03-13 NOTE — Consult Note (Addendum)
NAME:  [name], MRN:  [id number], DOB:  1929/08/29, LOS: 61 ADMISSION DATE:  03/02/2021, CONSULTATION DATE:  4/26 REFERRING MD:  Dr. Starla Link, CHIEF COMPLAINT:  Confusion, weakness   History of Present Illness:  87 y/o F who presented to [facility] on 4/10 with complaints of confusion and weakness.   The patient was admitted from 2/21-3/2 for distal left femur fracture s/p left distal femoral replacement by Dr. Delfino Lovett.  She was discharged to a SNF.  Reportedly at baseline, she has dementia that waxes and wanes from day to day - can talk and express her needs/wants.  Per report, she was not progressing well with PT at the SNF.    On presentation to the ER, she was documented as confused, weak with swelling / erythema of left knee, and SNF reported she was unable to swallow for the 3-4 days prior to admit and had a choking sensation with eating.  She was admitted by [facility] with concern for with periprosthetic joint infection.  The patient was pan cultured and infectious disease was consulted on admission.  Left knee was aspirated and culture was negative.  Blood cultures were negative but urine culture was positive for 80k enterococcus.  She was treated with IV daptomycin and rocephin.  Hospital course complicated by [facility] requiring cardizem infusion, agitated delirium requiring haldol.  She required 3L O2. ID & Ortho recommended palliative care.  Nephrology was consulted for AKI on CKD and she was deemed not an HD candidate. She had a large sacral and ankle decubitus present on admission.  Palliative Care met with the family on 4/25 and family wanted to continue care to include placement of a cortrak for artifical feeding but they were considering a DNR status.  On am of 4/26, the patient suffered a cardiac arrest requiring ACLS for approximately 18 minutes.  Initial event began with hypoxia.  She was treated with IV amiodarone, epi and sodium bicarbonate.  She was intubated and transferred urgently to the  ICU.    Pertinent  Medical History  HTN  Hypothyroidism   Significant Hospital Events: Including procedures, antibiotic start and stop dates in addition to other pertinent events   . 4/10 Admit with concern for periprosthetic joint infection   Interim History / Subjective:  As above  Objective   Blood pressure 127/72, pulse 94, temperature 97.9 F (36.6 C), temperature source Axillary, resp. rate 20, height 5' 4.02" (1.626 m), weight 93 kg, SpO2 (!) 16 %.    FiO2 (%):  [28 %] 28 %   Intake/Output Summary (Last 24 hours) at 03/13/2021 0725 Last data filed at 03/13/2021 4734 Gross per 24 hour  Intake 1526.08 ml  Output 200 ml  Net 1326.08 ml   Filed Weights   03/02/21 1024 03/11/21 0500 03/12/21 0436  Weight: 99 kg 94.5 kg 93 kg    Examination: General: critically ill appearing adult female lying in bed on vent HEENT: MM pink/moist, ETT Neuro: GCS 3, agonal breathing on vent, intermittent eye opening with upward gaze, tongue protrusion, no follow commands, not breathing over vent CV: s1s2 RRR, SB 50-60's, no m/r/g PULM: agonal respirations on vent, coarse crackles bilaterally  GI: soft, bsx4 active  Extremities: warm/dry, anasarca Skin: JP drain drain in left thigh with bloody drainage, knee immobilizer in place   [facility] [place]  4/26 >> images personally reviewed, ETT in right mainstem, left opacification of hemithorax  Resolved Hospital Problem list     Assessment & Plan:   Cardiac Arrest with  Circulatory Shock  Suspect respiratory leading to cardiac arrest with hypoxia.  Required ~18 minutes ACLS before ROSC. -transfer to ICU  -tele monitoring  -levophed for MAP >65  -assess STAT labs now  -hold lopressor with hypotension, rescheduled for 4/27 am and dose reduced. May need to stop all together.   Acute Hypercarbic / Hypoxic Respiratory Failure  Opacification of Left Hemithorax Atelectasis / Pleural Effusion  -PRVC 8cc/kg, rate 24 -follow up ABG post code -CXR reviewed  post arrest intubation, ETT adjusted  -follow up CXR in am   Acute Metabolic Encephalopathy  Abnormal motor / eye movements post arrest, concern for hypoxic encephalopathy  -follow serial neuro exam  -hold sedation to allow for exam   Sepsis secondary to L Knee Periprosthetic Joint Infection  -continue ABX -appreciate ID input  -follow WBC / fever trend  -monitor drain output  -have notified Ortho of status change   AKI on CKD  Hyperkalemia  -seen by Nephrology, deemed not an HD candidate  -Trend BMP / urinary output -lokelma 10 mg PT TID with re-check BMP this evening  -Replace electrolytes as indicated -Avoid nephrotoxic agents, ensure adequate renal perfusion  Elevated LFT's  Suspect in setting of poor perfusion / post arrest  -trend LFT's   AFwRVR  CAD Cardizem stopped 4/25, NSR / bradycardia  -hold cardizem  -tele monitoring  -hold lopressor, see above  Anemia  -trend CBC  -monitor for bleeding  DM  -SSI, very sensitive scale   Hypothyroidism  -continue synthroid  Moderate Protein Calorie Malnutrition  -TF per Nutrition   Dementia  -supportive care   Best practice (right click and "Reselect all SmartList Selections" daily)  Diet:  NPO Pain/Anxiety/Delirium protocol (if indicated): Yes (RASS goal 0) VAP protocol (if indicated): Yes DVT prophylaxis: Subcutaneous Heparin GI prophylaxis: PPI Glucose control:  SSI Yes Central venous access:  N/A Arterial line:  N/A Foley:  N/A Mobility:  bed rest  PT consulted: N/A Last date of multidisciplinary goals of care discussion:  Spoke with daughter's [name] and [name] at bedside.  [name] has been the patient's primary care giver and has lived here in [place] close to her all her life.  She struggles with the idea of her mother dying.  We discussed the implications of cardiac arrest and her abnormal motor movements with concern for anoxic injury.  They indicate she has suffered for some time and has had difficulties  with walking, progression of dementia and extensive wounds after being bed bound.  They express love for their mother and want the best for her.  We discussed the concept of DNR and possible compassionate withdrawal of care.  They would like to allow other family to visit.  Will revisit timing of extubation, etc with family this afternoon.  Support offered.  DNR order placed.  Code Status:  DNR Disposition: ICU  Labs   CBC: Recent Labs  Lab 03/07/21 0355 03/07/21 1830 03/08/21 0500 03/09/21 0430 03/10/21 0021 03/13/21 0425  WBC 13.6*  --  11.2* 12.2* 11.7* 14.4*  NEUTROABS 11.2*  --  9.6* 10.5* 10.0* 12.7*  HGB 8.5* 8.9* 8.7* 8.8* 8.5* 8.2*  HCT 26.5* 27.5* 27.2* 27.8* 26.8* 26.5*  MCV 88.3  --  88.3 89.7 89.0 92.3  PLT 258  --  309 333 348 626    Basic Metabolic Panel: Recent Labs  Lab 03/09/21 0430 03/09/21 1600 03/10/21 0021 03/10/21 1900 03/11/21 0320 03/11/21 1654 03/12/21 0518 03/13/21 0425  NA 145  --  144  --  146*  --  147* 141  K 3.6  --  3.4*  --  4.0  --  4.5 5.3*  CL 114*  --  113*  --  113*  --  113* 108  CO2 23  --  23  --  23  --  26 26  GLUCOSE 67*  --  139*  --  184*  --  168* 157*  BUN 73*  --  83*  --  106*  --  120* 141*  CREATININE 2.74*  --  2.88*  --  2.78*  --  2.90* 2.91*  CALCIUM 8.9  --  8.6*  --  8.8*  --  8.6* 8.4*  MG 2.0   < > 1.9 1.8 1.8 1.8  --  1.7  PHOS 4.5   < > 4.0 2.3* 1.8* <1.0* 1.7* 2.3*   < > = values in this interval not displayed.   GFR: Estimated Creatinine Clearance: 14.5 mL/min (A) (by C-G formula based on SCr of 2.91 mg/dL (H)). Recent Labs  Lab 03/08/21 0500 03/09/21 0430 03/10/21 0021 03/13/21 0425  WBC 11.2* 12.2* 11.7* 14.4*    Liver Function Tests: Recent Labs  Lab 03/10/21 0945 03/13/21 0425  AST 35 92*  ALT 25 69*  ALKPHOS 57 102  BILITOT 0.3 0.9  PROT 5.9* 5.8*  ALBUMIN 1.9* 1.9*   No results for input(s): LIPASE, AMYLASE in the last 168 hours. No results for input(s): AMMONIA in the last 168  hours.  ABG    Component Value Date/Time   HCO3 26.2 02/27/2021 2100   O2SAT 55.4 03/07/2021 2100     Coagulation Profile: No results for input(s): INR, PROTIME in the last 168 hours.  Cardiac Enzymes: Recent Labs  Lab 03/12/21 0518  CKTOTAL 72    HbA1C: No results found for: HGBA1C  CBG: Recent Labs  Lab 03/12/21 1217 03/12/21 1636 03/12/21 2053 03/12/21 2330 03/13/21 0425  GLUCAP 145* 122* 134* 135* 129*    Review of Systems:   Unable to complete as patient is altered on mechanical ventilation.   Past Medical History:  She,  has a past medical history of Arthritis, Gastritis, Helicobacter pylori, Hypertension, and Hypothyroidism.   Surgical History:   Past Surgical History:  Procedure Laterality Date  . ABDOMINAL HYSTERECTOMY    . CARPAL TUNNEL RELEASE    . CERVICAL FUSION    . COLON SURGERY  2010   Dr. Georgette Dover: secondary to cecal mass. Negative for malignancy  . COLONOSCOPY  May 2010   Dr. Oneida Alar: large cecal mass, referred to Dr. Georgette Dover  . ESOPHAGOGASTRODUODENOSCOPY  May 2010   normal esophagus, no Barrett's, moderate gastritis, +H.PYLORI  . I & D KNEE WITH POLY EXCHANGE Left 02/16/2021   Procedure: IRRIGATION AND DEBRIDEMENT KNEE WITH POLY EXCHANGE;  Surgeon: Rod Can, MD;  Location: [place];  Service: Orthopedics;  Laterality: Left;  . JOINT REPLACEMENT     bilateral knee replacement, left shoulder surgery  . TOTAL KNEE REVISION Left 01/14/2021   Procedure: LEFT DISTAL FEMUR REPLACEMENT;  Surgeon: Rod Can, MD;  Location: [place];  Service: Orthopedics;  Laterality: Left;     Social History:   reports that she has never smoked. She has never used smokeless tobacco. She reports that she does not drink alcohol and does not use drugs.   Family History:  Her family history is negative for Colon cancer.   Allergies Allergies  Allergen Reactions  . Amoxicillin Other (See Comments)    Burning sensation.  Marland Kitchen  Penicillins Other (See Comments)     Burning sensation.     Home Medications  Prior to Admission medications   Medication Sig Start Date End Date Taking? Authorizing Provider  acetaminophen (TYLENOL) 325 MG tablet Take 2 tablets (650 mg total) by mouth every 6 (six) hours as needed for mild pain (or Fever >/= 101). 01/17/21  Yes Geradine Girt, DO  ALPRAZolam (XANAX) 0.5 MG tablet Take 0.5 mg by mouth daily as needed. 02/14/21  Yes [provider]  cilostazol (PLETAL) 100 MG tablet Take 100 mg by mouth 2 (two) times daily.   Yes [provider]  felodipine (PLENDIL) 10 MG 24 hr tablet Take 10 mg by mouth daily.   Yes [provider]  furosemide (LASIX) 40 MG tablet Take 40 mg by mouth daily. 01/29/21  Yes [provider]  lansoprazole (PREVACID) 30 MG capsule Take 30 mg by mouth daily.   Yes [provider]  metoprolol tartrate (LOPRESSOR) 25 MG tablet Take 1 tablet (25 mg total) by mouth 2 (two) times daily. 01/17/21  Yes Eulogio Bear U, DO  potassium chloride SA (KLOR-CON) 20 MEQ tablet Take 20 mEq by mouth daily.   Yes [provider]  SYNTHROID 100 MCG tablet Take 100 mcg by mouth every morning. 01/29/21  Yes [provider]  aspirin 81 MG chewable tablet Chew 1 tablet (81 mg total) by mouth 2 (two) times daily with a meal. 03/05/21 04/19/21  Dorothyann Peng, PA     Critical care time: 18 minutes      Noe Gens, MSN, APRN, NP-C, AGACNP-BC [facility] 03/13/2021, 9:21 AM   Please see [email or web address] for pager details.   From 7A-7P if no response, please call [phone] After hours, please call ELink [phone]

## 2021-03-13 NOTE — Progress Notes (Signed)
Brief nephrology note,: Event noted.  Patient had cardiac arrest this morning required around 18 minutes of CPR.  Transferred to ICU, intubated.  The ICU team discussed with the daughter for declining clinical condition.  She is now DNR and plan for possible compassionate withdrawal of care this afternoon. No further recommendation from nephrology.  Please call us if needed.  Discussed with ICU team.

## 2021-03-13 NOTE — Progress Notes (Signed)
Reassessed patient at bedside.  Daughters, [facility] [name], present. The patient is more alert - nodding yes/no to questions at times, does make eye contact.  Levophed requirements reduced as day has progressed.  Reviewed with family that we will plan for a one-way extubation in the am and that while the patient has shown some improvement, overall she remains very ill and not likely to survive this admission. Support offered.      Noe Gens, MSN, APRN, NP-C, AGACNP-BC [facility] 03/13/2021, 3:29 PM   Please see [email or web address] for pager details.   From 7A-7P if no response, please call [phone] After hours, please call ELink [phone]

## 2021-03-13 NOTE — Progress Notes (Addendum)
Progress Note from the Palliative Medicine Team at [facility]   Patient Name: [name]       Date: 03/13/2021 DOB: Jan 16, 1929  Age: 87 y.o. MRN#: [id number] Attending Physician: Audria Nine, DO Primary Care Physician: Rosita Fire, MD Admit Date: 02/16/2021   Medical records reviewed   Per intake H&P --> 87 year old female with a PMH of Chronic Dementia, Peripheral Vascular Disease, and multiple medical problems presented to [facility] ED on 4/11 with confusion, weakness, mild AKI (Creatinine 2.53 with baseline ~ 1 mg/dL) and acute swelling and redness of the left knee. Left Knee Arthrocentesis performed by Ortho followed by a left Total Knee Arthroplasty with Radical Synovectomy on 4/16.  Today is day 15 of this hospitalization  Patient suffered a cardiac arrest requiring ACLS for approximately 18 minutes.  Patient is now intubated and in the ICU.   Family face treatment option decisions, advanced directive decisions and anticipatory care needs.  This NP visited patient at the bedside as a follow up for palliative medicine needs and emotional support.    I met again today with patient's daughters, [name] and [name] and several other family members  to continue discussion regarding diagnosis, treatment option decisions,  prognosis, GOC, EOL wishes, disposition and options.    Continued conversation regarding the seriousness of [name]'s current medical situation; sepsis,  worsening renal function, stage IV sacral wounds, dysphagia and poor oral intake, acute anemia requiring transfusion, dementia based dysphagia  and  overall failure to thrive, and now  A further event of cardiac arrest.    Created space and opportunity for family to explore the thoughts and feelings regarding current medical situation.  [name] is able to verbalize her understanding of the poor prognosis and "does not want this to go on too long but I want my sister/[name] to be okay  with it too"  Education offered on the logistics of liberation from the vent.  The difference between an aggressive medical intervention path and a palliative comfort path for this patient at this time in this situation was detailed.  [name] was able to support and direct her sister to the decision that current medical interventions will be continued for the next 24 hours, however in the morning unless patient is showing improvement they agreed to one-way extubation.  Discussed with sisters  the importance of continued conversation with each other and the  medical providers regarding overall plan of care and treatment options,  ensuring decisions are within the context of the patients values and GOCs.  This nurse practitioner will follow up tomorrow for continued support as family makes decisions regarding treatment plan.  Questions and concerns addressed   Discussed with bedside RN and [name] Ober NP  PMT will continue to support holistically  Total time spent on the unit was 45 minutes  Greater than 50% of the time was spent in counseling and coordination of care  Wadie Lessen NP  Palliative Medicine Team Team Phone # [phone] Pager [phone]

## 2021-03-13 NOTE — Code Documentation (Signed)
  Patient Name: [name]   MRN: [id number]   Date of Birth/ Sex: February 08, 1929 , female      Admission Date: 02/24/2021  Attending Provider: Aline August, MD  Primary Diagnosis: Sepsis [facility])   Indication: Pt was in her usual state of health until this AM, when she was noted to be hypoxic and then became pulseless. Code blue was subsequently called. At the time of arrival on scene, ACLS protocol was underway.   Technical Description:  - CPR performance duration:  18 minutes  - Was defibrillation or cardioversion used? Yes   - Was external pacer placed? No  - Was patient intubated pre/post CPR? Yes   Medications Administered: Y = Yes; Blank = No Amiodarone  Y  Atropine  N  Calcium  Y  Epinephrine  Y  Lidocaine  N  Magnesium  N  Norepinephrine  N  Phenylephrine  N  Sodium bicarbonate  Y  Vasopressin  N   Post CPR evaluation:  - Final Status - Was patient successfully resuscitated ? Yes - What is current rhythm? NSR - What is current hemodynamic status? Yes  Miscellaneous Information:  - Labs sent, including: CBC, CMP, Lactic Acid, Troponin, ABG, Mag  - Primary team notified?  Yes  - Family Notified? Yes  - Additional notes/ transfer status: Transferred to ICU     Riesa Pope, MD  03/13/2021, 7:45 AM

## 2021-03-13 NOTE — Progress Notes (Addendum)
OT Cancellation Note  Patient Details Name: KAYLEIGH BROADWELL MRN: [id number] DOB: 1929/03/21   Cancelled Treatment:    Reason Eval/Treat Not Completed: Medical issues which prohibited therapy (One way extubation scheduled for tomorrow. OT to continue to follow as medically appropriate.) Anticipate D/C from OT next visit due to pt's worsening condition.  4/27:  Pt with dementia, DNR and cardiac arrest requiring intubation. No therapeutic OT needs at this time. OT D/C at this time.  Jefferey Pica, OTR/L Acute Rehabilitation Services Pager: [phone] Office: [phone]   Jefferey Pica 03/13/2021, 4:55 PM

## 2021-03-13 NOTE — Progress Notes (Signed)
RT NOTE: RT obtained ABG per NP. ABG reported to NP bedside: 7.21/48.1/38/19.2 on PRVC tidal volume 430, RR 18, FiO2 100% and PEEP of 8.  RT pulled back ETT 5cm per NP. ETT was 27@lips  ETT is now 22@lips . Breath sounds now bilateral. RT will continue to monitor.

## 2021-03-14 ENCOUNTER — Inpatient Hospital Stay (HOSPITAL_COMMUNITY): Payer: Medicare Other

## 2021-03-14 DIAGNOSIS — N179 Acute kidney failure, unspecified: Secondary | ICD-10-CM | POA: Diagnosis not present

## 2021-03-14 LAB — GLUCOSE, CAPILLARY
Glucose-Capillary: 61 mg/dL — ABNORMAL LOW (ref 70–99)
Glucose-Capillary: 63 mg/dL — ABNORMAL LOW (ref 70–99)
Glucose-Capillary: 68 mg/dL — ABNORMAL LOW (ref 70–99)
Glucose-Capillary: 71 mg/dL (ref 70–99)
Glucose-Capillary: 79 mg/dL (ref 70–99)
Glucose-Capillary: 83 mg/dL (ref 70–99)
Glucose-Capillary: 87 mg/dL (ref 70–99)
Glucose-Capillary: 87 mg/dL (ref 70–99)

## 2021-03-14 LAB — RENAL FUNCTION PANEL
Albumin: 1.8 g/dL — ABNORMAL LOW (ref 3.5–5.0)
Anion gap: 13 (ref 5–15)
BUN: 140 mg/dL — ABNORMAL HIGH (ref 8–23)
CO2: 25 mmol/L (ref 22–32)
Calcium: 8.5 mg/dL — ABNORMAL LOW (ref 8.9–10.3)
Chloride: 103 mmol/L (ref 98–111)
Creatinine, Ser: 3.19 mg/dL — ABNORMAL HIGH (ref 0.44–1.00)
GFR, Estimated: 13 mL/min — ABNORMAL LOW (ref >60)
Glucose, Bld: 87 mg/dL (ref 70–99)
Phosphorus: 2.5 mg/dL (ref 2.5–4.6)
Potassium: 5.1 mmol/L (ref 3.5–5.1)
Sodium: 141 mmol/L (ref 135–145)

## 2021-03-14 LAB — MRSA PCR SCREENING: MRSA by PCR: NEGATIVE

## 2021-03-14 MED ORDER — DEXTROSE 50 % IV SOLN
12.5000 g | INTRAVENOUS | Status: AC
  Administered 2021-03-14: 12.5 g via INTRAVENOUS
  Filled 2021-03-14: qty 50

## 2021-03-14 MED ORDER — DEXTROSE 50 % IV SOLN
12.5000 g | INTRAVENOUS | Status: AC
Start: 2021-03-14 — End: 2021-03-14
  Administered 2021-03-14: 12.5 g via INTRAVENOUS

## 2021-03-14 MED ORDER — HYDROMORPHONE HCL 1 MG/ML IJ SOLN
0.5000 mg | INTRAMUSCULAR | Status: DC | PRN
  Administered 2021-03-14 – 2021-03-15 (×3): 0.5 mg via INTRAVENOUS
  Filled 2021-03-14 (×4): qty 0.5

## 2021-03-14 MED ORDER — DEXTROSE 50 % IV SOLN
INTRAVENOUS | Status: AC
  Administered 2021-03-14: 12.5 g via INTRAVENOUS
  Filled 2021-03-14: qty 50

## 2021-03-14 NOTE — Progress Notes (Signed)
Met with pt's two daughter and palliative NP, Mary at pt's bedside.   Discussed her mental status improvement with ability to follow commands, marignal pressure (but ability to wean from pressors at this time) and minimal settings on vent.   We approached the long term goals with them as the pt does have other chronic and acute on chronic conditions with her dementia, failure to thrive, renal failure (not a dialysis candidate). We proposed optimizing the patient as best as possible prior to extubation and while hopeful for recovery and stability if she were to decline then pursuing a focus on quality of life with comfort being at the forefront.   They are considering all options and discussing further with [facility].

## 2021-03-14 NOTE — Progress Notes (Signed)
Spoke with both of the pt's daughters on the phone. They expressed that they want to "just take it out so she can be comfortable tonight and we will see her in the morning." I asked if they were talking about doing a one-way extubation. They seemed unsure about what any of this meant. I told them it's probably best to just wait until tomorrow when they're here and they can speak with the provider about possibly doing a one-way extubation if that's what they would like to do. All questions answered to the best of my ability.

## 2021-03-14 NOTE — Progress Notes (Signed)
NAME:  [name], MRN:  [id number], DOB:  Feb 27, 1929, LOS: 69 ADMISSION DATE:  03/09/2021, CONSULTATION DATE:  4/26 REFERRING MD:  Dr. Starla Link, CHIEF COMPLAINT:  Confusion, weakness   History of Present Illness:  87 y/o F who presented to [facility] on 4/10 with complaints of confusion and weakness.   The patient was admitted from 2/21-3/2 for distal left femur fracture s/p left distal femoral replacement by Dr. Delfino Lovett.  She was discharged to a SNF.  Reportedly at baseline, she has dementia that waxes and wanes from day to day - can talk and express her needs/wants.  Per report, she was not progressing well with PT at the SNF.    On presentation to the ER, she was documented as confused, weak with swelling / erythema of left knee, and SNF reported she was unable to swallow for the 3-4 days prior to admit and had a choking sensation with eating.  She was admitted by [facility] with concern for with periprosthetic joint infection.  The patient was pan cultured and infectious disease was consulted on admission.  Left knee was aspirated and culture was negative.  Blood cultures were negative but urine culture was positive for 80k enterococcus.  She was treated with IV daptomycin and rocephin.  Hospital course complicated by [facility] requiring cardizem infusion, agitated delirium requiring haldol.  She required 3L O2. ID & Ortho recommended palliative care.  Nephrology was consulted for AKI on CKD and she was deemed not an HD candidate. She had a large sacral and ankle decubitus present on admission.  Palliative Care met with the family on 4/25 and family wanted to continue care to include placement of a cortrak for artifical feeding but they were considering a DNR status.  On am of 4/26, the patient suffered a cardiac arrest requiring ACLS for approximately 18 minutes.  Initial event began with hypoxia.  She was treated with IV amiodarone, epi and sodium bicarbonate.  She was intubated and transferred urgently to the  ICU.    Pertinent  Medical History  HTN  Hypothyroidism   Significant Hospital Events: Including procedures, antibiotic start and stop dates in addition to other pertinent events   . 4/10 Admit with concern for periprosthetic joint infection   Interim History / Subjective:  4/27: over course of yesterday pt became more alert. Opens eyes, nods/shakes head to questions. Follows simple commands. Weaned from vasopressors. Awaiting family arrival. A Only 242m uop/24 hr Objective   Blood pressure 126/71, pulse 67, temperature 97.9 F (36.6 C), temperature source Axillary, resp. rate (!) 24, height 5' 4.02" (1.626 m), weight 73.5 kg, SpO2 100 %.    Vent Mode: PRVC FiO2 (%):  [40 %-100 %] 40 % Set Rate:  [24 bmp] 24 bmp Vt Set:  [430 mL] 430 mL PEEP:  [8 cmH20] 8 cmH20 Plateau Pressure:  [18 cmH20-21 cmH20] 18 cmH20   Intake/Output Summary (Last 24 hours) at 03/14/2021 0819 Last data filed at 03/14/2021 0558 Gross per 24 hour  Intake 470 ml  Output 525 ml  Net -55 ml   Filed Weights   03/11/21 0500 03/12/21 0436 03/14/21 0334  Weight: 94.5 kg 93 kg 73.5 kg    Examination: General: critically ill appearing adult female lying in bed on vent awake  HEENT: ncat, eomi, perrla MM pink/moist, ETT Neuro: awake,following commands, tremulous CV: s1s2 RRR,  PULM:  coarse crackles bilaterally  GI: soft, bsx4 active  Extremities: warm/dry, anasarca with multiple breaks in skin Skin: JP drain drain  in left thigh with bloody drainage, knee immobilizer in place   [facility]  4/26 >> images personally reviewed, ETT in correct position. L hemithorax with resolved opacification  Resolved Hospital Problem list     Assessment & Plan:   Cardiac Arrest with Circulatory Shock  Suspect respiratory leading to cardiac arrest with hypoxia.  Required ~18 minutes ACLS before ROSC. -remains in ICU  -tele monitoring  -shock has resolved off vasopressors at this time.  -cont to hold antihtn agents  Acute  Hypercarbic / Hypoxic Respiratory Failure  Opacification of Left Hemithorax Atelectasis / Pleural Effusion  -PRVC 8cc/kg, rate 24 -will attempt cpap with improvement in mental status -perhaps extubation once family arrived, will discuss one way extubation in this patients case  Acute Metabolic Encephalopathy  -improving  Sepsis secondary to L Knee Periprosthetic Joint Infection  -continue ABX -appreciate ID input  -follow WBC / fever trend  -monitor drain output, no output from yesterday -ortho aware   AKI on CKD  Hyperkalemia  -seen by Nephrology, deemed not an HD candidate  -Trend BMP / urinary output -potassium improved with lokelma dosing.  -renal function decline continues but at slower rate.   -Replace electrolytes as indicated -Avoid nephrotoxic agents, ensure adequate renal perfusion  Elevated LFT's  Suspect in setting of poor perfusion / post arrest  -trend LFT's   AFwRVR  CAD Cardizem stopped 4/25, NSR / bradycardia  -hold cardizem  -tele monitoring  -hold lopressor, see above  Anemia  -trend CBC  -monitor for bleeding  DM  -SSI, very sensitive scale   Hypothyroidism  -continue synthroid  Moderate Protein Calorie Malnutrition  -TF per Nutrition   Dementia  -supportive care   Best practice (right click and "Reselect all SmartList Selections" daily)  Diet:  NPO Pain/Anxiety/Delirium protocol (if indicated): Yes (RASS goal 0) VAP protocol (if indicated): Yes DVT prophylaxis: Subcutaneous Heparin GI prophylaxis: PPI Glucose control:  SSI Yes Central venous access:  N/A Arterial line:  N/A Foley:  N/A Mobility:  bed rest  PT consulted: N/A Last date of multidisciplinary goals of care discussion:  DNR as of 4/26, appreciate pallative. Possible one way extubation in next 24-48 hours.  Code Status:  DNR Disposition: ICU  Labs   CBC: Recent Labs  Lab 03/08/21 0500 03/09/21 0430 03/10/21 0021 03/13/21 0425 03/13/21 0708 03/13/21 0744  WBC  11.2* 12.2* 11.7* 14.4* 11.9*  --   NEUTROABS 9.6* 10.5* 10.0* 12.7* 9.1*  --   HGB 8.7* 8.8* 8.5* 8.2* 8.4* 8.2*  HCT 27.2* 27.8* 26.8* 26.5* 28.4* 24.0*  MCV 88.3 89.7 89.0 92.3 96.9  --   PLT 309 333 348 352 355  --     Basic Metabolic Panel: Recent Labs  Lab 03/10/21 1900 03/11/21 0320 03/11/21 1654 03/12/21 0518 03/13/21 0425 03/13/21 0708 03/13/21 0744 03/13/21 1500 03/14/21 0331  NA  --  146*  --  147* 141 146* 141 141 141  K  --  4.0  --  4.5 5.3* 6.3* 6.2* 5.5* 5.1  CL  --  113*  --  113* 108 108  --  103 103  CO2  --  23  --  _0 --  27 25  GLUCOSE  --  184*  --  168* 157* 206*  --  96 87  BUN  --  106*  --  120* 141* 136*  --  138* 140*  CREATININE  --  2.78*  --  2.90* 2.91* 2.97*  --  2.95*  3.19*  CALCIUM  --  8.8*  --  8.6* 8.4* 12.0*  --  8.7* 8.5*  MG 1.8 1.8 1.8  --  1.7 2.2  --   --   --   PHOS 2.3* 1.8* <1.0* 1.7* 2.3*  --   --   --  2.5   GFR: Estimated Creatinine Clearance: 11.7 mL/min (A) (by C-G formula based on SCr of 3.19 mg/dL (H)). Recent Labs  Lab 03/09/21 0430 03/10/21 0021 03/13/21 0425 03/13/21 0708  WBC 12.2* 11.7* 14.4* 11.9*  LATICACIDVEN  --   --   --  8.3*    Liver Function Tests: Recent Labs  Lab 03/10/21 0945 03/13/21 0425 03/13/21 0708 03/14/21 0331  AST 35 92* 185*  --   ALT 25 69* 117*  --   ALKPHOS 57 102 106  --   BILITOT 0.3 0.9 0.7  --   PROT 5.9* 5.8* 5.3*  --   ALBUMIN 1.9* 1.9* 1.8* 1.8*   No results for input(s): LIPASE, AMYLASE in the last 168 hours. No results for input(s): AMMONIA in the last 168 hours.  ABG    Component Value Date/Time   PHART 7.210 (L) 03/13/2021 0744   PCO2ART 48.1 (H) 03/13/2021 0744   PO2ART 38 (LL) 03/13/2021 0744   HCO3 19.2 (L) 03/13/2021 0744   TCO2 21 (L) 03/13/2021 0744   ACIDBASEDEF 8.0 (H) 03/13/2021 0744   O2SAT 59.0 03/13/2021 0744     Coagulation Profile: No results for input(s): INR, PROTIME in the last 168 hours.  Cardiac Enzymes: Recent Labs  Lab  03/12/21 0518  CKTOTAL 72    HbA1C: Hgb A1c MFr Bld  Date/Time Value Ref Range Status  03/13/2021 08:37 AM 5.2 4.8 - 5.6 % Final    Comment:    (NOTE) Pre diabetes:          5.7%-6.4%  Diabetes:              >6.4%  Glycemic control for   <7.0% adults with diabetes     CBG: Recent Labs  Lab 03/13/21 1514 03/13/21 1918 03/13/21 2349 03/14/21 0316 03/14/21 0749  GLUCAP 131* 70 65* 79 68*       Critical care time: The patient is critically ill with multiple organ systems failure and requires high complexity decision making for assessment and support, frequent evaluation and titration of therapies, application of advanced monitoring technologies and extensive interpretation of multiple databases.  Critical care time 39 mins. This represents my time independent of the NPs time taking care of the pt. This is excluding procedures.    Audria Nine DO [facility] 03/14/2021, 8:19 AM See Amion for pager If no response to pager, please call [phone] until 1900 After 1900 please call [facility] [phone]

## 2021-03-14 NOTE — Progress Notes (Signed)
Nutrition Follow-up  DOCUMENTATION CODES:   Obesity unspecified  INTERVENTION:   If continued aggressive interventions are pursued, recommend resuming tube feeds via Cortrak: - Recommend Vital AF 1.2 @ 20 ml/hr and advance by 10 ml q 8 hours to goal rate of 45 ml/hr (1080 ml/day) - ProSource TF 45 ml BID  Recommended tube feeding regimen at goal would provide 1376 kcal, 103 grams of protein, and 876 ml of H2O.  - 1 packet Juven BID per tube, each packet provides 95 calories, 2.5 grams of protein, and 9.8 grams of carbohydrate; also contains L-arginine and L-glutamine, vitamin C, vitamin E, vitamin B-12, zinc, calcium, and calcium Beta-hydroxy-Beta-methylbutyrate to support wound healing  NUTRITION DIAGNOSIS:   Increased nutrient needs related to wound healing (3 stage 4 PI and a DTI to left heel) as evidenced by estimated needs.  Ongoing  GOAL:   Patient will meet greater than or equal to 90% of their needs  Unmet, tube feeds currently on hold  MONITOR:   Vent status,Labs,Weight trends,Skin,I & O's,Other (GOC)  REASON FOR ASSESSMENT:   Consult Enteral/tube feeding initiation and management,Calorie Count  ASSESSMENT:   Patient is a 87 yo female with presents with multiple wounds chronic stage 4 PI x 2, right ankle stage 4,  left heel DTI. History of PVD, HTN, hypothyroidism.  4/11 - diet advanced to dysphagia 2 with thin liquids 4/14 - diet changed to soft, s/p L knee aspiration and injection 4/16 - s/p I&D L total knee arthroplasty with radical synovectomy and application of negative pressure incisional dressing, diet downgraded to fulls then advanced to heart healthy 4/17 - diet downgraded to dysphagia 1 with thin liquids  4/18 - 5% albumin given 4/22 - Cortrak placed (gastric tip) 4/26 - cardiac arrest, intubated  Discussed pt with RN and during ICU rounds. Per RN, tube feeds are on hold due to high output via OG tube. Palliative Care team is involved regarding  [place].  Cortrak tube remains in place, currently clamped. OG tube also in place, currently to low intermittent suction with dark yellow output in tubing and cannister.  Admit weight: 63.6 kg Current weight: 73.5 kg Lowest weight: 61.2 kg  Pt with +3 pitting edema to BUE and BLE.  Patient is currently intubated on ventilator support MV: 10.3 L/min Temp (24hrs), Avg:97.9 F (36.6 C), Min:97.6 F (36.4 C), Max:98.1 F (36.7 C)  Medications reviewed and include: SSI q 4 hours, MVI with minerals, Juven BID, protonix, vitamin B-12, IV abx  Labs reviewed: BUN 140, creatinine 3.19, elevated LFTs, lactic acid 8.3, hemoglobin 8.2 CBG's: 61-131 x 24 hours   UOP: 0 ml x 24 hours, 50 ml so far today OGT output: 500 ml x 24 hours JP drain: 25 ml x 24 hours I/O's: +4.8 L since admit  Diet Order:   Diet Order    None      EDUCATION NEEDS:   Education needs have been addressed  Skin:  Skin Assessment: Skin Integrity Issues: DTI: left foot Stage IV: sacrum x 2, right ankle Incisions: left knee  Last BM:  03/13/21  Height:   Ht Readings from Last 1 Encounters:  02/26/21 5' 4.02" (1.626 m)    Weight:   Wt Readings from Last 1 Encounters:  03/14/21 73.5 kg    BMI:  Body mass index is 27.8 kg/m.  Estimated Nutritional Needs:   Kcal:  1300  Protein:  95-110 grams  Fluid:  >/= 1.5 L/day    Gustavus Bryant, MS, RD, LDN  Inpatient Clinical Dietitian Please see AMiON for contact information.

## 2021-03-15 DIAGNOSIS — N179 Acute kidney failure, unspecified: Secondary | ICD-10-CM | POA: Diagnosis not present

## 2021-03-15 LAB — CBC
HCT: 21.3 % — ABNORMAL LOW (ref 36.0–46.0)
HCT: 22.8 % — ABNORMAL LOW (ref 36.0–46.0)
Hemoglobin: 6.8 g/dL — CL (ref 12.0–15.0)
Hemoglobin: 7.4 g/dL — ABNORMAL LOW (ref 12.0–15.0)
MCH: 28.8 pg (ref 26.0–34.0)
MCH: 28.8 pg (ref 26.0–34.0)
MCHC: 31.9 g/dL (ref 30.0–36.0)
MCHC: 32.5 g/dL (ref 30.0–36.0)
MCV: 90.3 fL (ref 80.0–100.0)
MCV: 88.7 fL (ref 80.0–100.0)
Platelets: 246 10*3/uL (ref 150–400)
Platelets: 267 10*3/uL (ref 150–400)
RBC: 2.36 MIL/uL — ABNORMAL LOW (ref 3.87–5.11)
RBC: 2.57 MIL/uL — ABNORMAL LOW (ref 3.87–5.11)
RDW: 20.7 % — ABNORMAL HIGH (ref 11.5–15.5)
RDW: 20.7 % — ABNORMAL HIGH (ref 11.5–15.5)
WBC: 8.2 10*3/uL (ref 4.0–10.5)
WBC: 9.3 10*3/uL (ref 4.0–10.5)
nRBC: 0 % (ref 0.0–0.2)
nRBC: 0 % (ref 0.0–0.2)

## 2021-03-15 LAB — GLUCOSE, CAPILLARY
Glucose-Capillary: 68 mg/dL — ABNORMAL LOW (ref 70–99)
Glucose-Capillary: 78 mg/dL (ref 70–99)
Glucose-Capillary: 62 mg/dL — ABNORMAL LOW (ref 70–99)
Glucose-Capillary: 88 mg/dL (ref 70–99)
Glucose-Capillary: 94 mg/dL (ref 70–99)

## 2021-03-15 LAB — COMPREHENSIVE METABOLIC PANEL
ALT: 68 U/L — ABNORMAL HIGH (ref 0–44)
AST: 71 U/L — ABNORMAL HIGH (ref 15–41)
Albumin: 1.8 g/dL — ABNORMAL LOW (ref 3.5–5.0)
Alkaline Phosphatase: 63 U/L (ref 38–126)
Anion gap: 13 (ref 5–15)
BUN: 143 mg/dL — ABNORMAL HIGH (ref 8–23)
CO2: 25 mmol/L (ref 22–32)
Calcium: 8.3 mg/dL — ABNORMAL LOW (ref 8.9–10.3)
Chloride: 103 mmol/L (ref 98–111)
Creatinine, Ser: 3.64 mg/dL — ABNORMAL HIGH (ref 0.44–1.00)
GFR, Estimated: 11 mL/min — ABNORMAL LOW (ref >60)
Glucose, Bld: 77 mg/dL (ref 70–99)
Potassium: 4.5 mmol/L (ref 3.5–5.1)
Sodium: 141 mmol/L (ref 135–145)
Total Bilirubin: 0.6 mg/dL (ref 0.3–1.2)
Total Protein: 5 g/dL — ABNORMAL LOW (ref 6.5–8.1)

## 2021-03-15 LAB — TYPE AND SCREEN
ABO/RH(D): O NEG
Antibody Screen: NEGATIVE

## 2021-03-15 MED ORDER — MIDAZOLAM BOLUS VIA INFUSION
2.0000 mg | INTRAVENOUS | Status: DC | PRN
  Filled 2021-03-15: qty 2

## 2021-03-15 MED ORDER — GLYCOPYRROLATE 0.2 MG/ML IJ SOLN
0.1000 mg | Freq: Once | INTRAMUSCULAR | Status: AC
  Administered 2021-03-15: 0.1 mg via INTRAVENOUS
  Filled 2021-03-15: qty 1

## 2021-03-15 MED ORDER — DEXTROSE 50 % IV SOLN
12.5000 g | INTRAVENOUS | Status: AC
  Administered 2021-03-15: 12.5 g via INTRAVENOUS
  Filled 2021-03-15: qty 50

## 2021-03-15 MED ORDER — MIDAZOLAM HCL 2 MG/2ML IJ SOLN
INTRAMUSCULAR | Status: AC
  Administered 2021-03-15: 2 mg
  Filled 2021-03-15: qty 2

## 2021-03-15 MED ORDER — DEXTROSE 50 % IV SOLN
12.5000 g | INTRAVENOUS | Status: AC
  Administered 2021-03-15: 12.5 g via INTRAVENOUS

## 2021-03-15 MED ORDER — MIDAZOLAM HCL (PF) 10 MG/2ML IJ SOLN: 2.0000 mg | INTRAMUSCULAR | Status: DC | PRN

## 2021-03-15 MED ORDER — HYDROMORPHONE HCL 1 MG/ML IJ SOLN
0.5000 mg | INTRAMUSCULAR | Status: DC | PRN
  Administered 2021-03-15: 0.5 mg via INTRAVENOUS

## 2021-03-15 MED ORDER — MIDAZOLAM 50MG/50ML (1MG/ML) PREMIX INFUSION
0.5000 mg/h | INTRAVENOUS | Status: DC
Start: 2021-03-15 — End: 2021-03-19
  Administered 2021-03-15: 0.5 mg/h via INTRAVENOUS
  Administered 2021-03-16 – 2021-03-18 (×2): 1.5 mg/h via INTRAVENOUS
  Filled 2021-03-15 (×3): qty 50

## 2021-03-15 MED ORDER — HYDROMORPHONE HCL 1 MG/ML IJ SOLN: 1.0000 mg | INTRAMUSCULAR | Status: DC | PRN

## 2021-03-15 NOTE — Progress Notes (Signed)
NAME:  PROMISE WELDIN, MRN:  [id number], DOB:  May 16, 1929, LOS: 24 ADMISSION DATE:  02/24/2021, CONSULTATION DATE:  4/26 REFERRING MD:  Dr. Starla Link, CHIEF COMPLAINT:  Confusion, weakness   History of Present Illness:  87 y/o F who presented to [facility] on 4/10 with complaints of confusion and weakness.   The patient was admitted from 2/21-3/2 for distal left femur fracture s/p left distal femoral replacement by Dr. Delfino Lovett.  She was discharged to a SNF.  Reportedly at baseline, she has dementia that waxes and wanes from day to day - can talk and express her needs/wants.  Per report, she was not progressing well with PT at the SNF.    On presentation to the ER, she was documented as confused, weak with swelling / erythema of left knee, and SNF reported she was unable to swallow for the 3-4 days prior to admit and had a choking sensation with eating.  She was admitted by [facility] with concern for with periprosthetic joint infection.  The patient was pan cultured and infectious disease was consulted on admission.  Left knee was aspirated and culture was negative.  Blood cultures were negative but urine culture was positive for 80k enterococcus.  She was treated with IV daptomycin and rocephin.  Hospital course complicated by [facility] requiring cardizem infusion, agitated delirium requiring haldol.  She required 3L O2. ID & Ortho recommended palliative care.  Nephrology was consulted for AKI on CKD and she was deemed not an HD candidate. She had a large sacral and ankle decubitus present on admission.  Palliative Care met with the family on 4/25 and family wanted to continue care to include placement of a cortrak for artifical feeding but they were considering a DNR status.  On am of 4/26, the patient suffered a cardiac arrest requiring ACLS for approximately 18 minutes.  Initial event began with hypoxia.  She was treated with IV amiodarone, epi and sodium bicarbonate.  She was intubated and transferred urgently to the  ICU.    Pertinent  Medical History  HTN  Hypothyroidism   Significant Hospital Events: Including procedures, antibiotic start and stop dates in addition to other pertinent events   . 4/10 Admit with concern for periprosthetic joint infection  . 4/26: cardiac arrest 2/2 hypoxia s/p rosc  Interim History / Subjective:  4/28: pt with low hgb overnight. Renal function further declines.  4/27: over course of yesterday pt became more alert. Opens eyes, nods/shakes head to questions. Follows simple commands. Weaned from vasopressors. Awaiting family arrival. A Only 289m uop/24 hr Objective   Blood pressure 91/67, pulse 100, temperature 98.2 F (36.8 C), temperature source Axillary, resp. rate (!) 24, height 5' 4.02" (1.626 m), weight 71.4 kg, SpO2 98 %.    Vent Mode: PRVC FiO2 (%):  [40 %] 40 % Set Rate:  [24 bmp] 24 bmp Vt Set:  [430 mL] 430 mL PEEP:  [8 cmH20] 8 cmH20 Plateau Pressure:  [16 cmH20-19 cmH20] 16 cmH20   Intake/Output Summary (Last 24 hours) at 03/15/2021 0820 Last data filed at 03/15/2021 0700 Gross per 24 hour  Intake 1225 ml  Output 160 ml  Net 1065 ml   Filed Weights   03/12/21 0436 03/14/21 0334 03/15/21 0336  Weight: 93 kg 73.5 kg 71.4 kg    Examination: General: critically ill appearing adult female lying in bed on vent awake  HEENT: ncat, eomi, perrla MM pink/moist, ETT Neuro: awake,following commands, tremulous CV: s1s2 RRR,  PULM:  coarse crackles bilaterally  GI: soft, bsx4 active  Extremities: warm/dry, anasarca with multiple breaks in skin Skin: JP drain drain in left thigh with bloody drainage, knee immobilizer in place   [facility]  4/26 >> images personally reviewed, ETT in correct position. L hemithorax with resolved opacification  Resolved Hospital Problem list     Assessment & Plan:   Cardiac Arrest with Circulatory Shock  Suspect respiratory leading to cardiac arrest with hypoxia.  Required ~18 minutes ACLS before ROSC. -remains in ICU   -tele monitoring  -shock has resolved off vasopressors at this time.  -cont to hold antihtn agents  Acute Hypercarbic / Hypoxic Respiratory Failure  Opacification of Left Hemithorax Atelectasis / Pleural Effusion  -PRVC 8cc/kg, rate 24 -will attempt cpap with improvement in mental status -perhaps extubation once family arrived, will discuss one way extubation in this patients case  Acute Metabolic Encephalopathy  -improving  Sepsis secondary to L Knee Periprosthetic Joint Infection  -continue ABX -appreciate ID input  -follow WBC / fever trend  -monitor drain output, no output from yesterday -ortho aware   AKI on CKD  Hyperkalemia  -seen by Nephrology, deemed not an HD candidate  -Trend BMP / urinary output -potassium improved with lokelma dosing.  -renal function decline continues but at slower rate.   -Replace electrolytes as indicated -Avoid nephrotoxic agents, ensure adequate renal perfusion  Elevated LFT's  Suspect in setting of poor perfusion / post arrest  -trend LFT's, improving at this time.   AFwRVR  CAD Cardizem stopped 4/25, NSR / bradycardia  -hold cardizem  -tele monitoring  -hold lopressor, see above  Anemia  -trend CBC  -monitor for bleeding  DM  -SSI, very sensitive scale   Hypothyroidism  -continue synthroid  Moderate Protein Calorie Malnutrition  -TF per Nutrition   Dementia  -supportive care   Best practice (right click and "Reselect all SmartList Selections" daily)  Diet:  NPO Pain/Anxiety/Delirium protocol (if indicated): Yes (RASS goal 0) VAP protocol (if indicated): Yes DVT prophylaxis: Subcutaneous Heparin GI prophylaxis: PPI Glucose control:  SSI Yes Central venous access:  N/A Arterial line:  N/A Foley:  N/A Mobility:  bed rest  PT consulted: N/A Last date of multidisciplinary goals of care discussion:  DNR as of 4/26, appreciate pallative. Possible one way extubation in next 24-48 hours.  Code Status:   DNR Disposition: ICU  Labs   CBC: Recent Labs  Lab 03/09/21 0430 03/10/21 0021 03/13/21 0425 03/13/21 0708 03/13/21 0744 03/15/21 0415 03/15/21 0546  WBC 12.2* 11.7* 14.4* 11.9*  --  8.2 9.3  NEUTROABS 10.5* 10.0* 12.7* 9.1*  --   --   --   HGB 8.8* 8.5* 8.2* 8.4* 8.2* 6.8* 7.4*  HCT 27.8* 26.8* 26.5* 28.4* 24.0* 21.3* 22.8*  MCV 89.7 89.0 92.3 96.9  --  90.3 88.7  PLT 333 348 352 355  --  246 973    Basic Metabolic Panel: Recent Labs  Lab 03/10/21 1900 03/11/21 0320 03/11/21 1654 03/12/21 0518 03/13/21 0425 03/13/21 0708 03/13/21 0744 03/13/21 1500 03/14/21 0331 03/15/21 0339  NA  --  146*  --  147* 141 146* 141 141 141 141  K  --  4.0  --  4.5 5.3* 6.3* 6.2* 5.5* 5.1 4.5  CL  --  113*  --  113* 108 108  --  103 103 103  CO2  --  23  --  _0 --  _1 GLUCOSE  --  184*  --  168* 157*  206*  --  96 87 77  BUN  --  106*  --  120* 141* 136*  --  138* 140* 143*  CREATININE  --  2.78*  --  2.90* 2.91* 2.97*  --  2.95* 3.19* 3.64*  CALCIUM  --  8.8*  --  8.6* 8.4* 12.0*  --  8.7* 8.5* 8.3*  MG 1.8 1.8 1.8  --  1.7 2.2  --   --   --   --   PHOS 2.3* 1.8* <1.0* 1.7* 2.3*  --   --   --  2.5  --    GFR: Estimated Creatinine Clearance: 10.2 mL/min (A) (by C-G formula based on SCr of 3.64 mg/dL (H)). Recent Labs  Lab 03/13/21 0425 03/13/21 0708 03/15/21 0415 03/15/21 0546  WBC 14.4* 11.9* 8.2 9.3  LATICACIDVEN  --  8.3*  --   --     Liver Function Tests: Recent Labs  Lab 03/10/21 0945 03/13/21 0425 03/13/21 0708 03/14/21 0331 03/15/21 0339  AST 35 92* 185*  --  71*  ALT 25 69* 117*  --  68*  ALKPHOS 57 102 106  --  63  BILITOT 0.3 0.9 0.7  --  0.6  PROT 5.9* 5.8* 5.3*  --  5.0*  ALBUMIN 1.9* 1.9* 1.8* 1.8* 1.8*   No results for input(s): LIPASE, AMYLASE in the last 168 hours. No results for input(s): AMMONIA in the last 168 hours.  ABG    Component Value Date/Time   PHART 7.210 (L) 03/13/2021 0744   PCO2ART 48.1 (H) 03/13/2021 0744    PO2ART 38 (LL) 03/13/2021 0744   HCO3 19.2 (L) 03/13/2021 0744   TCO2 21 (L) 03/13/2021 0744   ACIDBASEDEF 8.0 (H) 03/13/2021 0744   O2SAT 59.0 03/13/2021 0744     Coagulation Profile: No results for input(s): INR, PROTIME in the last 168 hours.  Cardiac Enzymes: Recent Labs  Lab 03/12/21 0518  CKTOTAL 72    HbA1C: Hgb A1c MFr Bld  Date/Time Value Ref Range Status  03/13/2021 08:37 AM 5.2 4.8 - 5.6 % Final    Comment:    (NOTE) Pre diabetes:          5.7%-6.4%  Diabetes:              >6.4%  Glycemic control for   <7.0% adults with diabetes     CBG: Recent Labs  Lab 03/14/21 1635 03/14/21 1917 03/14/21 2348 03/15/21 0308 03/15/21 0746  GLUCAP 87 63* 71 68* 78       Critical care time: The patient is critically ill with multiple organ systems failure and requires high complexity decision making for assessment and support, frequent evaluation and titration of therapies, application of advanced monitoring technologies and extensive interpretation of multiple databases.  Critical care time 36 mins. This represents my time independent of the NPs time taking care of the pt. This is excluding procedures.    Audria Nine DO [place] [facility] 03/15/2021, 8:20 AM See Amion for pager If no response to pager, please call [phone] until 1900 After 1900 please call [facility] [phone]

## 2021-03-15 NOTE — Progress Notes (Addendum)
This chaplain responded to PMT spiritual care referral.  The Pt. RN-Linda is in the Pt. room at the time of the visit.  Both sisters are at the Pt. bedside. RN-Kimberly updated the chaplain.    The chaplain will coordinate a spiritual care F/U and communication with the  Palliative Care team.  **[id number]  The chaplain is appreciative of RN-Kim referral for spiritual care after family meeting with Dr. Ruthann Cancer.    This chaplain offered a peaceful presence and reflective listening beside both of the Pt. daughters as they shared memories of the Pt. in their storytelling.  The chaplain understands both sisters desire the Pt. to remain comfortable and not suffer, therefore giving the medical team permission to extubate.   The family accepted the chaplain's invitation for prayer. This chaplain updated tonight's on-call chaplain.

## 2021-03-15 NOTE — Progress Notes (Signed)
eLink Physician-Brief Progress Note Patient Name: [name] DOB: 1929-08-17 MRN: [id number]   Date of Service  03/15/2021  HPI/Events of Note  Hb 6.8. No bleeding noted. Notes mention possible plan for comfort based approach. Drop in h/h noted from yesterday  eICU Interventions  Repeat a CBC now.      Intervention Category Intermediate Interventions: Other:  Margaretmary Lombard 03/15/2021, 5:25 AM

## 2021-03-15 NOTE — Progress Notes (Signed)
Family accepting pt's decline. Reflecting upon her decline over the past 3 months since her fall. They state they do not want her to "suffer" they are in agreement to remove all lines and ensure she is comfortable.   They would like to remain at her side.   RN updated

## 2021-03-15 NOTE — Progress Notes (Signed)
PT Cancellation Note  Patient Details Name: [name] MRN: [id number] DOB: Mar 08, 1929   Cancelled Treatment:    Reason Eval/Treat Not Completed: Medical issues which prohibited therapy (Nurse states that plan is to extubate today and possibly is comfort care. Nurse asked PT to check back tomorrow.)   Alvira Philips 03/15/2021, 12:46 PM  Trudi Morgenthaler M,PT Acute Rehab Services [phone] (pager)

## 2021-03-15 NOTE — Progress Notes (Signed)
Pt extubated to room air with Rn at bedside.

## 2021-03-15 NOTE — Progress Notes (Signed)
Patient extubated to room air with RT at bedside. Versed drip running for comfort. Family at bedside with patient and RN will continue to monitor for comfort.

## 2021-03-16 DIAGNOSIS — Z515 Encounter for palliative care: Secondary | ICD-10-CM | POA: Diagnosis not present

## 2021-03-16 NOTE — Progress Notes (Signed)
NAME:  [name], MRN:  [id number], DOB:  [birth year], LOS: 33 ADMISSION DATE:  02/23/2021, CONSULTATION DATE:  4/26 REFERRING MD:  Dr. Starla Link, CHIEF COMPLAINT:  Confusion, weakness   History of Present Illness:  87 y/o F who presented to [facility] on 4/10 with complaints of confusion and weakness.   The patient was admitted from 2/21-3/2 for distal left femur fracture s/p left distal femoral replacement by Dr. Delfino Lovett.  She was discharged to a SNF.  Reportedly at baseline, she has dementia that waxes and wanes from day to day - can talk and express her needs/wants.  Per report, she was not progressing well with PT at the SNF.    On presentation to the ER, she was documented as confused, weak with swelling / erythema of left knee, and SNF reported she was unable to swallow for the 3-4 days prior to admit and had a choking sensation with eating.  She was admitted by [facility] with concern for with periprosthetic joint infection.  The patient was pan cultured and infectious disease was consulted on admission.  Left knee was aspirated and culture was negative.  Blood cultures were negative but urine culture was positive for 80k enterococcus.  She was treated with IV daptomycin and rocephin.  Hospital course complicated by [facility] requiring cardizem infusion, agitated delirium requiring haldol.  She required 3L O2. ID & Ortho recommended palliative care.  Nephrology was consulted for AKI on CKD and she was deemed not an HD candidate. She had a large sacral and ankle decubitus present on admission.  Palliative Care met with the family on 4/25 and family wanted to continue care to include placement of a cortrak for artifical feeding but they were considering a DNR status.  On am of 4/26, the patient suffered a cardiac arrest requiring ACLS for approximately 18 minutes.  Initial event began with hypoxia.  She was treated with IV amiodarone, epi and sodium bicarbonate.  She was intubated and transferred urgently to the  ICU.    Transition to comfort measures only 03/15/2021  Pertinent  Medical History  HTN  Hypothyroidism   Significant Hospital Events: Including procedures, antibiotic start and stop dates in addition to other pertinent events   . 4/10 Admit with concern for periprosthetic joint infection  . 4/26: cardiac arrest 2/2 hypoxia s/p rosc . 4/28: Comfort measures only  Interim History / Subjective:   4/29: Appears comfortable 4/28: pt with low hgb overnight. Renal function further declines.  4/27: over course of yesterday pt became more alert. Opens eyes, nods/shakes head to questions. Follows simple commands. Weaned from vasopressors. Awaiting family arrival. A Only 29m uop/24 hr  Objective   Blood pressure 122/61, pulse 75, temperature 97.7 F (36.5 C), temperature source Axillary, resp. rate 20, height 5' 4.02" (1.626 m), weight 71.4 kg, SpO2 95 %.    Vent Mode: PRVC FiO2 (%):  [40 %] 40 % Set Rate:  [24 bmp] 24 bmp Vt Set:  [430 mL] 430 mL PEEP:  [8 cmH20] 8 cmH20   Intake/Output Summary (Last 24 hours) at 03/16/2021 0844 Last data filed at 03/16/2021 0600 Gross per 24 hour  Intake 66.99 ml  Output 105 ml  Net -38.01 ml   Filed Weights   03/12/21 0436 03/14/21 0334 03/15/21 0336  Weight: 93 kg 73.5 kg 71.4 kg    Examination: General: Critically ill-appearing lady, lethargic HEENT: Dry oral mucosa Neuro: Appears comfortable CV: s1s2 RRR,  PULM: Decreased air movement bilaterally GI: Bowel sounds appreciated  Extremities: Warm, dry Skin: JP drain drain in left thigh with bloody drainage, knee immobilizer in place   Last chest x-ray was 4/26  Resolved Hospital Problem list     Assessment & Plan:   Patient is currently for comfort measures -Appears comfortable  Status postcardiac arrest with circulatory shock -18 minutes of ACLS before ROSC  Respiratory failure, acute on chronic -Ventilator was discontinued 4/28  Sepsis secondary to left knee  periprosthetic joint infection  Acute kidney injury on chronic kidney disease  A. fib with RVR  Hypothyroidism  Dementia  Comfort measures in place  Will transfer to 6th  [place], MD [place] [facility] Pager: [phone]

## 2021-03-16 NOTE — Progress Notes (Signed)
Nutrition Brief Note  Chart reviewed. Pt transitioning to comfort care.  No further nutrition interventions planned at this time.  Please re-consult as needed.    Kate Jaemarie Hochberg, MS, RD, LDN Inpatient Clinical Dietitian Please see [facility] for contact information.  

## 2021-03-16 NOTE — Progress Notes (Signed)
This chaplain is present for F/U spiritual care after the Pt. transition to comfort care on Thursday.  The chaplain was updated by the Pt. RN-Julian. The RN shares and the chaplain understands the Pt. daughters were updated "the Pt. is resting comfortably"  by phone this morning. The chaplain paused at the Pt. bedside and  shared a moment of prayer with the Pt.   This chaplain will attempt to meet the daughters at the bedside to offer spiritual care as needed.  Chaplain Sallyanne Kuster [phone]

## 2021-03-16 NOTE — Progress Notes (Signed)
Pt transferred from 70M with a wound vac dressing to left thigh but not connected to suction. Per previous primary nurse, the wound vac was discontinued days ago. Also has a JP drain to same extremity. No orders for wound care in chart. Pt on [facility] for comfort measures and on a versed drip . Attending MD text paged and waiting for response

## 2021-03-17 DIAGNOSIS — G9341 Metabolic encephalopathy: Secondary | ICD-10-CM | POA: Diagnosis not present

## 2021-03-17 DIAGNOSIS — Z96659 Presence of unspecified artificial knee joint: Secondary | ICD-10-CM | POA: Diagnosis not present

## 2021-03-17 DIAGNOSIS — T8459XS Infection and inflammatory reaction due to other internal joint prosthesis, sequela: Secondary | ICD-10-CM | POA: Diagnosis not present

## 2021-03-17 MED ORDER — ATROPINE SULFATE 1 % OP SOLN
2.0000 [drp] | Freq: Four times a day (QID) | OPHTHALMIC | Status: DC | PRN
  Administered 2021-03-18: 2 [drp] via SUBLINGUAL
  Filled 2021-03-17: qty 2

## 2021-03-17 NOTE — Progress Notes (Signed)
NAME:  [name], MRN:  [id number], DOB:  10-25-1929, LOS: 37 ADMISSION DATE:  02/24/2021, CONSULTATION DATE:  4/26 REFERRING MD:  Dr. Starla Link, CHIEF COMPLAINT:  Confusion, weakness   History of Present Illness:  87 y/o F who presented to [facility] on 4/10 with complaints of confusion and weakness.   The patient was admitted from 2/21-3/2 for distal left femur fracture s/p left distal femoral replacement by Dr. Delfino Lovett.  She was discharged to a SNF.  Reportedly at baseline, she has dementia that waxes and wanes from day to day - can talk and express her needs/wants.  Per report, she was not progressing well with PT at the SNF.    On presentation to the ER, she was documented as confused, weak with swelling / erythema of left knee, and SNF reported she was unable to swallow for the 3-4 days prior to admit and had a choking sensation with eating.  She was admitted by [facility] with concern for with periprosthetic joint infection.  The patient was pan cultured and infectious disease was consulted on admission.  Left knee was aspirated and culture was negative.  Blood cultures were negative but urine culture was positive for 80k enterococcus.  She was treated with IV daptomycin and rocephin.  Hospital course complicated by [facility] requiring cardizem infusion, agitated delirium requiring haldol.  She required 3L O2. ID & Ortho recommended palliative care.  Nephrology was consulted for AKI on CKD and she was deemed not an HD candidate. She had a large sacral and ankle decubitus present on admission.  Palliative Care met with the family on 4/25 and family wanted to continue care to include placement of a cortrak for artifical feeding but they were considering a DNR status.  On am of 4/26, the patient suffered a cardiac arrest requiring ACLS for approximately 18 minutes.  Initial event began with hypoxia.  She was treated with IV amiodarone, epi and sodium bicarbonate.  She was intubated and transferred urgently to the  ICU.    Transition to comfort measures only 03/15/2021  Pertinent  Medical History  HTN  Hypothyroidism   Significant Hospital Events: Including procedures, antibiotic start and stop dates in addition to other pertinent events   . 4/10 Admit with concern for periprosthetic joint infection  . 4/26: cardiac arrest 2/2 hypoxia s/p rosc . 4/28: Comfort measures only  Interim History / Subjective:   4/30: Patient is on midazolam 1.5 mg infusion, comfortable.  No new issues reported 4/29: Appears comfortable 4/28: pt with low hgb overnight. Renal function further declines.  4/27: over course of yesterday pt became more alert. Opens eyes, nods/shakes head to questions. Follows simple commands. Weaned from vasopressors. Awaiting family arrival. A Only 266m uop/24 hr  Objective   Blood pressure (!) 159/77, pulse 75, temperature 97.7 F (36.5 C), resp. rate 18, height 5' 4.02" (1.626 m), weight 71.4 kg, SpO2 91 %.        Intake/Output Summary (Last 24 hours) at 03/17/2021 0826 Last data filed at 03/17/2021 0600 Gross per 24 hour  Intake 0 ml  Output 50 ml  Net -50 ml   Filed Weights   03/12/21 0436 03/14/21 0334 03/15/21 0336  Weight: 93 kg 73.5 kg 71.4 kg    Examination: General: Ill-appearing elderly woman, laying in bed HEENT: Upper airway noise and secretions heard Neuro: Comfortable, sedated, does not open eyes to voice or interact, does not withdraw from pain, Grammas CV: Regular, distant, no murmur PULM: Referred upper airway noise,  some rhonchi, shallow breaths GI: Positive bowel sounds Extremities: Well perfused Skin: Left thigh wound incision clean and dry, I do not see a JP drain   Resolved Hospital Problem list     Assessment & Plan:   History of dementia, here with sepsis due to left periprosthetic joint infection with course complicated by: Cardiac arrest with circulatory shock Acute respiratory failure require mechanical ventilation, extubated 4/28 Acute  on chronic renal failure due to shock Atrial fibrillation with RVR Hypothyroidism  -Patient transition to comfort care and does appear to be comfortable, currently on midazolam 1.5 mg infusion.  Dilaudid is also available if needed -Plan to continue comfort based care.  Secretion management or oropharyngeal suctioning only if any evidence of respiratory distress or if concerning to family.   Baltazar Apo, MD, PhD 03/17/2021, 8:32 AM [facility] [phone] or if no answer before 7:00PM call [phone] For any issues after 7:00PM please call eLink [phone]

## 2021-03-17 NOTE — Progress Notes (Signed)
eLink Physician-Brief Progress Note Patient Name: [name] DOB: 04-05-1929 MRN: [id number]   Date of Service  03/17/2021  HPI/Events of Note  Excessive oral secretions. Patient is on comfort measures.   eICU Interventions  Plan: 1. Atropine solution 2 drops SL Q 5 hours PRN excessive oral secretions.      Intervention Category Major Interventions: Other:  Lysle Dingwall 03/17/2021, 10:53 PM

## 2021-03-17 NOTE — Plan of Care (Signed)
Pt remains unresponsive, family updated via telephone.    Problem: Health Behavior/Discharge Planning: Goal: Ability to manage health-related needs will improve Outcome: Not Progressing

## 2021-03-18 DIAGNOSIS — G9341 Metabolic encephalopathy: Secondary | ICD-10-CM | POA: Diagnosis not present

## 2021-03-18 DEATH — deceased

## 2021-03-28 DIAGNOSIS — N390 Urinary tract infection, site not specified: Secondary | ICD-10-CM | POA: Diagnosis present

## 2021-03-28 DIAGNOSIS — J9621 Acute and chronic respiratory failure with hypoxia: Secondary | ICD-10-CM | POA: Diagnosis not present

## 2021-03-28 DIAGNOSIS — A419 Sepsis, unspecified organism: Secondary | ICD-10-CM | POA: Diagnosis not present

## 2021-03-28 DIAGNOSIS — I469 Cardiac arrest, cause unspecified: Secondary | ICD-10-CM | POA: Diagnosis not present

## 2021-03-28 DIAGNOSIS — R6521 Severe sepsis with septic shock: Secondary | ICD-10-CM | POA: Diagnosis not present

## 2021-03-28 DIAGNOSIS — R092 Respiratory arrest: Secondary | ICD-10-CM | POA: Diagnosis not present

## 2021-03-28 DIAGNOSIS — B952 Enterococcus as the cause of diseases classified elsewhere: Secondary | ICD-10-CM | POA: Diagnosis present

## 2021-03-28 DIAGNOSIS — G931 Anoxic brain damage, not elsewhere classified: Secondary | ICD-10-CM | POA: Diagnosis not present

## 2021-04-06 MED FILL — Medication: Qty: 1 | Status: CN

## 2021-04-06 MED FILL — Medication: Qty: 1 | Status: AC

## 2021-04-18 NOTE — Progress Notes (Signed)
[place] [facility] contacted and pt is not a candidate for donation

## 2021-04-18 NOTE — Progress Notes (Incomplete)
Pt condition

## 2021-04-18 NOTE — Death Summary Note (Signed)
DEATH SUMMARY   Patient Details  Name: [name] MRN: [id number] DOB: 10/17/1929  Admission/Discharge Information   Admit Date:  03-15-21  Date of Death: Date of Death: 2021/04/05  Time of Death: Time of Death: 02/01/1744  Length of Stay: January 26, 2023  Referring Physician: Rosita Fire, MD   Reason(s) for Hospitalization  Encephalopathy, lethargy  Diagnoses  Preliminary cause of death:   Acute on chronic respiratory failure with hypoxia  Secondary Diagnoses (including complications and co-morbidities):  Principal Problem:   Respiratory arrest before cardiac arrest [facility]) Active Problems:   Essential hypertension   PERIPHERAL VASCULAR DISEASE   GERD   Hypothyroidism   AKI (acute kidney injury) ([place])   Pressure injury of skin   Severe Sepsis due to left leg cellulitis and possible Lt septic knee and Enterococcus UTI   Acute metabolic encephalopathy   Leukocytosis   Hyponatremia   Dehydration   Hypoalbuminemia   Paroxysmal A-fib (HCC)   Altered mental status   Septic infrapatellar bursitis of left knee   Infection of total knee replacement (HCC)   Septic shock ([place])   Acute on chronic respiratory failure with hypoxia ([place])   Anoxic encephalopathy ([place])   Enterococcus UTI   Brief Hospital Course (including significant findings, care, treatment, and services provided and events leading to death)  [name] is a 87 y.o. year old female  who presented to [facility] on 2023/03/16 with complaints of confusion and weakness.   The patient had been admitted from 2/21-3/2 for distal left femur fracture s/p left distal femoral replacement by Dr. Delfino Lovett.  She was discharged to a SNF.  She has baseline dementia - able to talk and express her needs/wants.  Per report, she was not progressing well with PT at the SNF.    On presentation to the ER 03/16/23, she was documented as confused, weak with swelling / erythema of left knee. SNF reported she was unable to swallow for the 3-4 days prior to admit  and had a choking sensation with eating.  She was admitted by [facility] with concern for with periprosthetic joint infection.  The patient was pan cultured and infectious disease was consulted on admission.  Left knee was aspirated and culture was negative.  Blood cultures were negative but urine culture was positive for 80k enterococcus.  She was treated with IV daptomycin and rocephin.  Hospital course complicated by [facility] requiring cardizem infusion, agitated delirium requiring haldol.  She required 3L O2. ID & Ortho recommended palliative care.  Nephrology was consulted for AKI on CKD and she was deemed not an HD candidate. She had a large sacral and ankle decubitus present on admission.  Palliative Care met with the family on 4/25 and family wanted to continue care to include placement of a cortrak for artifical feeding but they were considering a DNR status.  On am of 4/26, the patient suffered a cardiac arrest requiring ACLS for approximately 18 minutes.  Initial event began with hypoxia and progressive respiratory failure.  She was treated with IV amiodarone, epi and sodium bicarbonate.  She was intubated and transferred urgently to the ICU.  ICU course significant for relative anemia, shock and progressive acute renal failure.   Transition to comfort measures only 03/15/2021. She was treated with comfort based medications and expired on 04-05-21.     Pertinent Labs and Studies  Significant Diagnostic Studies CT ABDOMEN PELVIS WO CONTRAST  Result Date: 03/06/2021 CLINICAL DATA:  87 year old female with history of renal mass. Renal insufficiency.  CT the abdomen EXAM: CT ABDOMEN AND PELVIS WITHOUT CONTRAST TECHNIQUE: Multidetector CT imaging of the abdomen and pelvis was performed following the standard protocol without IV contrast. COMPARISON:  CT the abdomen and pelvis 04/16/2009. FINDINGS: Comment: Today's study is limited for detection and characterization of visceral and/or vascular lesions by lack of  IV contrast. Lower chest: Small bilateral pleural effusions with what appears to be areas of dependent subsegmental atelectasis in the lower lobes of the lungs bilaterally. Atherosclerotic calcifications in the descending thoracic aorta as well as the left anterior descending, left circumflex and right coronary arteries. Hepatobiliary: No suspicious cystic or solid hepatic lesions are confidently identified on today's noncontrast CT examination. Unenhanced appearance of the gallbladder is unremarkable. Pancreas: No definite pancreatic mass or peripancreatic fluid collections or inflammatory changes are noted on today's noncontrast CT examination. Spleen: Unremarkable. Adrenals/Urinary Tract: No definite renal mass confidently identified on today's noncontrast CT examination. No hydroureteronephrosis. Urinary bladder is decompressed around an indwelling Foley balloon catheter. Bilateral adrenal glands are normal in appearance. Stomach/Bowel: Unenhanced appearance of the stomach is normal. No pathologic dilatation of small bowel or colon. Status post appendectomy. Vascular/Lymphatic: Aortic atherosclerosis. No lymphadenopathy noted in the abdomen or pelvis. Reproductive: Status post hysterectomy.  Ovaries are a trophic. Other: No significant volume of ascites.  No pneumoperitoneum. Musculoskeletal: Diffuse body wall edema. There are no aggressive appearing lytic or blastic lesions noted in the visualized portions of the skeleton. IMPRESSION: 1. Limited study secondary to lack of IV contrast. No renal mass confidently identified on today's noncontrast examination. 2. Diffuse body wall edema and small bilateral pleural effusions; imaging findings that may suggest a state of anasarca. 3. Aortic atherosclerosis, in addition to at least 3 vessel coronary artery disease. 4. Additional incidental findings, as above. Electronically Signed   By: Vinnie Langton M.D.   On: 03/06/2021 08:07   DG Abd 1 View  Result Date:  03/13/2021 CLINICAL DATA:  Status post OG tube placement. EXAM: ABDOMEN - 1 VIEW COMPARISON:  None. FINDINGS: Two tubes are identified with their tips projecting in the distal fundus of the stomach. IMPRESSION: As above. Electronically Signed   By: Inge Rise M.D.   On: 03/13/2021 14:23   US RENAL  Result Date: 03/04/2021 CLINICAL DATA:  AK I EXAM: RENAL / URINARY TRACT ULTRASOUND COMPLETE COMPARISON:  CT abdomen pelvis 04/16/2009 FINDINGS: Right Kidney: Renal measurements: 8.7 x 4.3 x 5.7 cm = volume: 113 mL. Echogenicity is increased. There is a solid mass in the inferior pole measuring 2.4 x 2.8 x 2.9 cm. No hydronephrosis. No shadowing renal calculi. Left Kidney: Renal measurements: 7.6 x 3.3 x 4.1 cm = volume: 54 mL. Echogenicity within normal limits. No mass or hydronephrosis visualized. Bladder: Decompressed with Foley catheter in place. Other: None. IMPRESSION: 1. There is a solid lesion in the inferior pole the right kidney measuring 2.9 cm. Further evaluation with renal protocol CT or MRI is recommended. 2. Increased echogenicity of the right kidney as can be seen in medical renal disease. 3.  Unremarkable sonographic appearance of the left kidney. Electronically Signed   By: Audie Pinto M.D.   On: 03/04/2021 15:42   MR SACRUM SI JOINTS WO CONTRAST  Result Date: 03/09/2021 CLINICAL DATA:  Evaluate decubitus ulcers. EXAM: MRI PELVIS WITHOUT CONTRAST TECHNIQUE: Multiplanar multisequence MR imaging of the pelvis was performed. No intravenous contrast was administered. COMPARISON:  CT scan 03/05/2021 FINDINGS: There is a deep sacral decubitus ulcer extending right down to the base of the  sacrum and coccyx. There is a pathologic fracture of the coccyx which is displaced anteriorly. There is osteomyelitis in the distal sacrum and coccyx. Fluid and gas in the decubitus ulcer with some surrounding granulation tissue. I do not see any significant myofasciitis involving the adjacent gluteus  maximus muscles. Presacral fluid and edema noted but no evidence of discrete drainable intrapelvic abscess. Both hips are normally located. No findings suspicious for septic arthritis or osteomyelitis. The pubic symphysis and SI joints are intact. Diffuse and fairly marked at atrophy of the hip and pelvic musculature IMPRESSION: 1. Deep sacral decubitus ulcer extending right down to the base of the sacrum and coccyx. 2. Osteomyelitis in the distal sacrum and coccyx. 3. Displaced pathologic fracture of the coccyx. 4. No evidence of septic arthritis or osteomyelitis involving the hips. 5. Presacral fluid and edema but no evidence of discrete drainable intrapelvic abscess. Electronically Signed   By: Marijo Sanes M.D.   On: 03/09/2021 13:20   MR ANKLE RIGHT WO CONTRAST  Result Date: 03/07/2021 CLINICAL DATA:  Right lateral ankle wound. Evaluate for osteomyelitis. EXAM: MRI OF THE RIGHT ANKLE WITHOUT CONTRAST TECHNIQUE: Multiplanar, multisequence MR imaging of the ankle was performed. No intravenous contrast was administered. COMPARISON:  Right foot x-rays dated January 10, 2021. Right ankle x-rays dated January 08, 2021. FINDINGS: TENDONS Peroneal: Peroneal longus tendon intact. Peroneal brevis intact. Posteromedial: Posterior tibial tendon intact. Flexor digitorum longus tendon intact. Flexor hallucis longus tendon intact. Anterior: Tibialis anterior tendon intact. Extensor hallucis longus tendon intact Extensor digitorum longus tendon intact. Achilles:  Intact. Plantar Fascia: Intact. Mild thickening of the proximal central band. LIGAMENTS Lateral: Anterior talofibular ligament intact. Calcaneofibular ligament intact. Posterior talofibular ligament intact. Anterior and posterior tibiofibular ligaments intact. Medial: Deltoid ligament intact. Spring ligament intact. CARTILAGE Ankle Joint: No significant joint effusion. Normal ankle mortise. No chondral defect. Subtalar Joints/Sinus Tarsi: Normal subtalar  joints. No subtalar joint effusion. Normal sinus tarsi. Bones: Mild marrow edema within the lateral malleolus. T1 marrow signal is mostly preserved. No acute fracture or dislocation. No suspicious bone lesion. Mild navicular-medial cuneiform osteoarthritis. Soft Tissue: Near circumferential lower leg and hindfoot soft tissue swelling extending into the dorsolateral forefoot, sparing the medial aspect. Soft tissue ulceration over the lateral malleolus. No fluid collection. IMPRESSION: 1. Soft tissue ulceration over the lateral malleolus with underlying mild marrow edema, suspicious for early osteomyelitis. No abscess. Electronically Signed   By: Titus Dubin M.D.   On: 03/07/2021 12:13   US Venous Img Lower Bilateral (DVT)  Result Date: 02/27/2021 CLINICAL DATA:  87 year old female with leg swelling. EXAM: BILATERAL LOWER EXTREMITY VENOUS DOPPLER ULTRASOUND TECHNIQUE: Gray-scale sonography with graded compression, as well as color Doppler and duplex ultrasound were performed to evaluate the lower extremity deep venous systems from the level of the common femoral vein and including the common femoral, femoral, profunda femoral, popliteal and calf veins including the posterior tibial, peroneal and gastrocnemius veins when visible. The superficial great saphenous vein was also interrogated. Spectral Doppler was utilized to evaluate flow at rest and with distal augmentation maneuvers in the common femoral, femoral and popliteal veins. COMPARISON:  None. FINDINGS: RIGHT LOWER EXTREMITY Common Femoral Vein: No evidence of thrombus. Normal compressibility, respiratory phasicity and response to augmentation. Saphenofemoral Junction: No evidence of thrombus. Normal compressibility and flow on color Doppler imaging. Profunda Femoral Vein: No evidence of thrombus. Normal compressibility and flow on color Doppler imaging. Femoral Vein: No evidence of thrombus. Normal compressibility, respiratory phasicity and response to  augmentation.  Popliteal Vein: No evidence of thrombus. Normal compressibility, respiratory phasicity and response to augmentation. Calf Veins: No evidence of thrombus. Normal compressibility and flow on color Doppler imaging. Other Findings:  None. LEFT LOWER EXTREMITY Common Femoral Vein: No evidence of thrombus. Normal compressibility, respiratory phasicity and response to augmentation. Saphenofemoral Junction: No evidence of thrombus. Normal compressibility and flow on color Doppler imaging. Profunda Femoral Vein: No evidence of thrombus. Normal compressibility and flow on color Doppler imaging. Femoral Vein: No evidence of thrombus. Normal compressibility, respiratory phasicity and response to augmentation. Popliteal Vein: No evidence of thrombus. Normal compressibility, respiratory phasicity and response to augmentation. Calf Veins: No evidence of thrombus. Normal compressibility and flow on color Doppler imaging. Other Findings:  None. IMPRESSION: No evidence of bilateral lower extremity deep venous thrombosis. Ruthann Cancer, MD [facility] Electronically Signed   By: Ruthann Cancer MD   On: 02/27/2021 11:58   DG CHEST PORT 1 VIEW  Result Date: 03/14/2021 CLINICAL DATA:  Acute respiratory failure EXAM: PORTABLE CHEST 1 VIEW COMPARISON:  03/13/2021 FINDINGS: Endotracheal tube is been withdrawn, now 3.8 cm above the carina. Nasoenteric feeding tube extends into the upper abdomen. Right upper extremity PICC line tip noted within the superior vena cava. Marked interval improvement in left lung aeration. Small left pleural effusion is present. No pneumothorax. Retrocardiac opacification is present likely representing atelectasis or infiltrate within the a medial lung bases bilaterally. Cardiac size within normal limits. Pulmonary vascularity is normal. Advanced degenerative changes noted within the right shoulder. Left total shoulder arthroplasty noted.  IMPRESSION: Interval repositioning of endotracheal tube, now in appropriate position. Marked interval improvement in the left lung aeration. Small left pleural effusion present. Bibasilar retrocardiac opacification likely representing medial basilar atelectasis. Electronically Signed   By: Fidela Salisbury MD   On: 03/14/2021 04:44   DG CHEST PORT 1 VIEW  Result Date: 03/13/2021 CLINICAL DATA:  Intubation. EXAM: PORTABLE CHEST 1 VIEW COMPARISON:  Chest x-ray 14 2022. FINDINGS: Endotracheal tube tip noted in the right mainstem bronchus. Proximal repositioning of 6 cm suggested. Feeding tube noted with tip below left hemidiaphragm. Right PICC line noted with tip over cavoatrial junction. Atelectasis/consolidation left lung. No pleural effusion or pneumothorax. Heart size difficult to assess. Degenerative changes scoliosis lumbar spine. Deformity right shoulder. Postsurgical changes left hip. IMPRESSION: 1. Endotracheal tube noted with tip in the right mainstem bronchus. Proximal repositioning of approximately 6 cm suggested. Associated atelectasis/consolidation left lung. 2. Feeding tube noted with tip below left hemidiaphragm. Right PICC line noted with tip over cavoatrial junction. Critical Value/emergent results were called by telephone at the time of interpretation on 03/13/2021 at 7:43 am to nurse Seth Bake , who verbally acknowledged these results. Electronically Signed   By: Marcello Moores  Register   On: 03/13/2021 07:46   DG Knee Complete 4 Views Left  Result Date: 02/26/2021 CLINICAL DATA:  History of surgery EXAM: LEFT KNEE - COMPLETE 4+ VIEW COMPARISON:  01/14/2021 FINDINGS: Status post left knee replacement with resection of distal femur and long-stem prosthesis in the femur and tibia. No acute fracture is visualized. Considerable soft tissue swelling at the knee and distal thigh. Suspected small gas at the suprapatellar joint space. Vascular calcifications. IMPRESSION: Status post left knee replacement with  intact hardware and normal alignment. Considerable soft tissue swelling of the knee and distal thigh, correlate for any symptoms of infection. Positive for knee effusion. Small air-fluid level at the suprapatellar joint space, correlate for any history of recent knee aspiration.  Electronically Signed   By: Donavan Foil M.D.   On: 02/26/2021 20:22   DG Abd Portable 1V  Result Date: 03/09/2021 CLINICAL DATA:  Weakness, severe sepsis due to left leg cellulitis a possible septic left knee with UTI EXAM: PORTABLE ABDOMEN - 1 VIEW COMPARISON:  CT abdomen and pelvis March 05, 2021 FINDINGS: The bowel gas pattern is normal. Enteric feeding catheter with tip overlying the stomach. No radio-opaque calculi or other significant radiographic abnormality are seen. Pelvic phleboliths. Levoconvex curvature of the lumbar spine. Spondylosis. IMPRESSION: 1. Nonobstructive bowel gas pattern. 2. Enteric feeding catheter with tip overlying the stomach. Electronically Signed   By: Dahlia Bailiff MD   On: 03/09/2021 17:10   [place] EKG SITE RITE  Result Date: 03/04/2021 If Site Rite image not attached, placement could not be confirmed due to current cardiac rhythm.    Rose Fillers Lenix Kidd 03/28/2021, 3:29 PM

## 2021-04-18 NOTE — Progress Notes (Signed)
NAME:  [name], MRN:  [id number], DOB:  1929/03/03, LOS: 21 ADMISSION DATE:  03/15/2021, CONSULTATION DATE:  4/26 REFERRING MD:  Dr. Starla Link, CHIEF COMPLAINT:  Confusion, weakness   History of Present Illness:  87 y/o F who presented to [facility] on 4/10 with complaints of confusion and weakness.   The patient was admitted from 2/21-3/2 for distal left femur fracture s/p left distal femoral replacement by Dr. Delfino Lovett.  She was discharged to a SNF.  Reportedly at baseline, she has dementia that waxes and wanes from day to day - can talk and express her needs/wants.  Per report, she was not progressing well with PT at the SNF.    On presentation to the ER, she was documented as confused, weak with swelling / erythema of left knee, and SNF reported she was unable to swallow for the 3-4 days prior to admit and had a choking sensation with eating.  She was admitted by [facility] with concern for with periprosthetic joint infection.  The patient was pan cultured and infectious disease was consulted on admission.  Left knee was aspirated and culture was negative.  Blood cultures were negative but urine culture was positive for 80k enterococcus.  She was treated with IV daptomycin and rocephin.  Hospital course complicated by [facility] requiring cardizem infusion, agitated delirium requiring haldol.  She required 3L O2. ID & Ortho recommended palliative care.  Nephrology was consulted for AKI on CKD and she was deemed not an HD candidate. She had a large sacral and ankle decubitus present on admission.  Palliative Care met with the family on 4/25 and family wanted to continue care to include placement of a cortrak for artifical feeding but they were considering a DNR status.  On am of 4/26, the patient suffered a cardiac arrest requiring ACLS for approximately 18 minutes.  Initial event began with hypoxia.  She was treated with IV amiodarone, epi and sodium bicarbonate.  She was intubated and transferred urgently to the  ICU.    Transition to comfort measures only 03/15/2021  Pertinent  Medical History  HTN  Hypothyroidism   Significant Hospital Events: Including procedures, antibiotic start and stop dates in addition to other pertinent events   . 4/10 Admit with concern for periprosthetic joint infection  . 4/26: cardiac arrest 2/2 hypoxia s/p rosc . 4/28: Comfort measures only  Interim History / Subjective:   5/1: Looks comfortable. Atropine drops added o/n for secretions.  4/30: Patient is on midazolam 1.5 mg infusion, comfortable.  No new issues reported 4/29: Appears comfortable 4/28: pt with low hgb overnight. Renal function further declines.  4/27: over course of yesterday pt became more alert. Opens eyes, nods/shakes head to questions. Follows simple commands. Weaned from vasopressors. Awaiting family arrival. A Only 234m uop/24 hr  Objective   Blood pressure 130/70, pulse (!) 102, temperature 98.1 F (36.7 C), temperature source Oral, resp. rate 18, height 5' 4.02" (1.626 m), weight 71.4 kg, SpO2 91 %.        Intake/Output Summary (Last 24 hours) at [id number] Last data filed at 505/19/[id number] Gross per 24 hour  Intake --  Output 150 ml  Net -150 ml   Filed Weights   03/12/21 0436 03/14/21 0334 03/15/21 0336  Weight: 93 kg 73.5 kg 71.4 kg    Examination: General: Elderly ill-appearing woman, no distress HEENT: Upper airway noise and secretions have resolved Neuro: Comfortable, sedate, does not open eyes or respond to voice or pain CV: Distant,  regular, no murmur PULM: Tachypneic, small volumes, clear GI: Positive bowel sounds Extremities: No edema Skin: Left thigh wound incision clean and dry, no JP drain or wound VAC in place   Resolved Hospital Problem list     Assessment & Plan:   Dementia, here with sepsis due to left periprosthetic joint infection with course complicated by: Cardiac arrest with circulatory shock Acute respiratory failure require mechanical  ventilation, extubated 4/28 Acute on chronic renal failure due to shock Atrial fibrillation with RVR Hypothyroidism  -Patient transitioned to comfort care and she does appear to be comfortable.  She is on midazolam 1.5 mg/h infusion.  Dilaudid is available if needed. -She did have upper airway secretions 4/30, these have improved.  Atropine drops ordered.   Baltazar Apo, MD, PhD 03/23/21, 7:52 AM [facility] [phone] or if no answer before 7:00PM call [phone] For any issues after 7:00PM please call eLink [phone]

## 2021-04-18 NOTE — Progress Notes (Signed)
Called family to see if they wanted to come and be with pt as she was showing signs of further deterioration. Family stated that they lived far away from the hospital and could not come at this time

## 2021-04-18 NOTE — Progress Notes (Signed)
Went to recheck on pt at 1745hrs. No s/s of breathing noted. No breath or heart sounds heard. Second RN Drue Dun confirmed earlier findings. Patient called and notified. MD notified. Time of death 1745hrs

## 2021-04-18 DEATH — deceased

## 2022-10-20 IMAGING — CT CT HEAD W/O CM
3 series · 15 of 47 positions shown, 18 images · non-contrast
Comparison: None.

CLINICAL DATA: Altered mental status. Increased weakness. Decreased
appetite.

EXAM:
CT HEAD WITHOUT CONTRAST
TECHNIQUE: Contiguous axial images were obtained from the base of the skull
through the vertex without intravenous contrast.

[Series 2: head w o · axial · 0.45mm/px · z∈[+6,+131]mm · 9 of 31 slices shown, 12 images]
[im 3/31  brain]
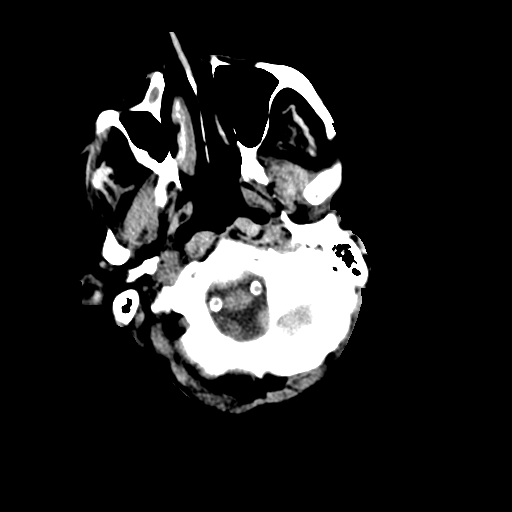
[im 3/31  bone]
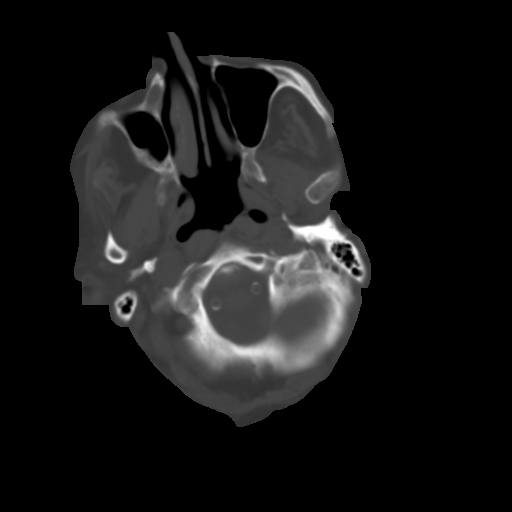
[im 6/31  brain]
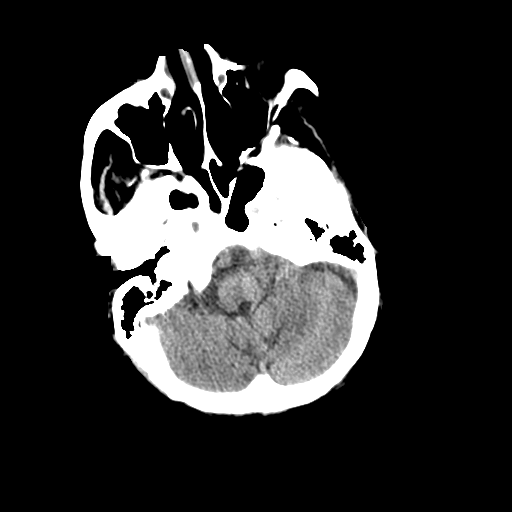
[im 9/31  brain]
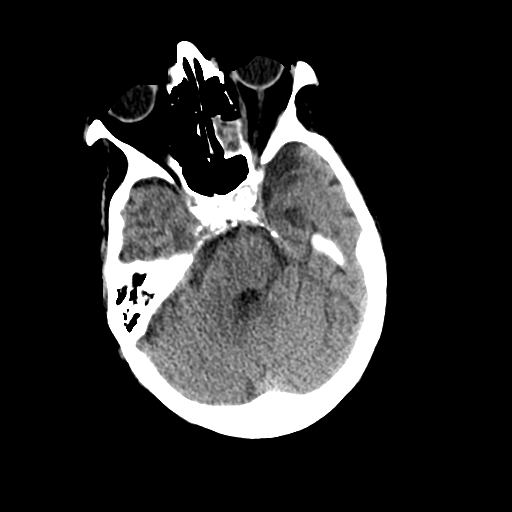
[im 12/31  brain]
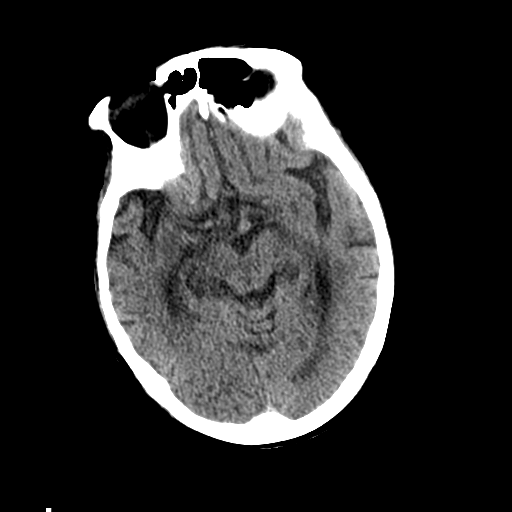
[im 16/31  brain]
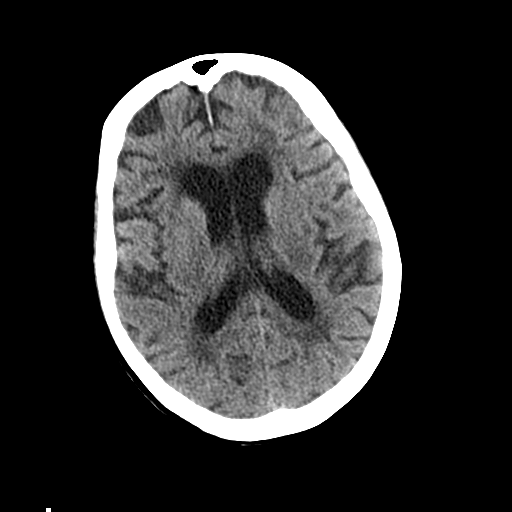
[im 16/31  bone]
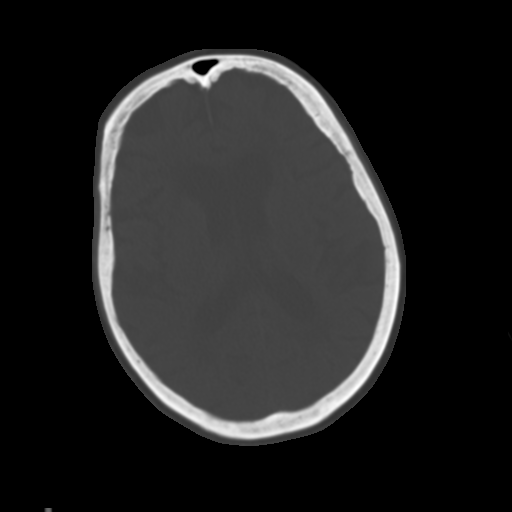
[im 19/31  brain]
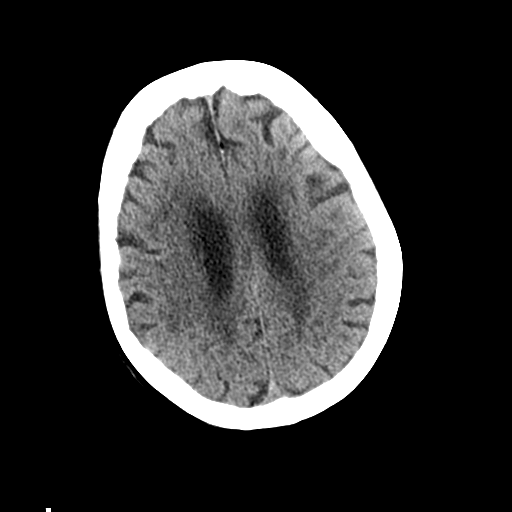
[im 22/31  brain]
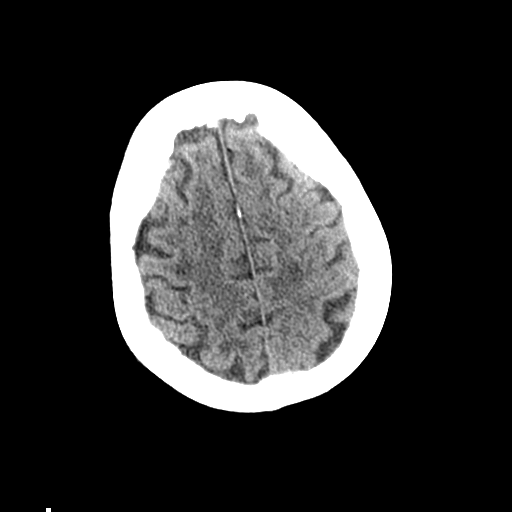
[im 25/31  brain]
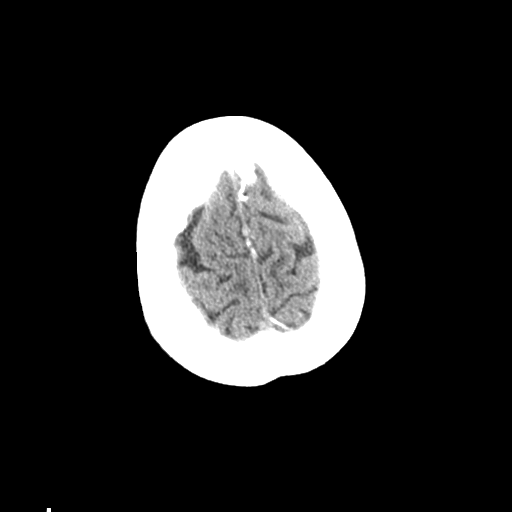
[im 28/31  brain]
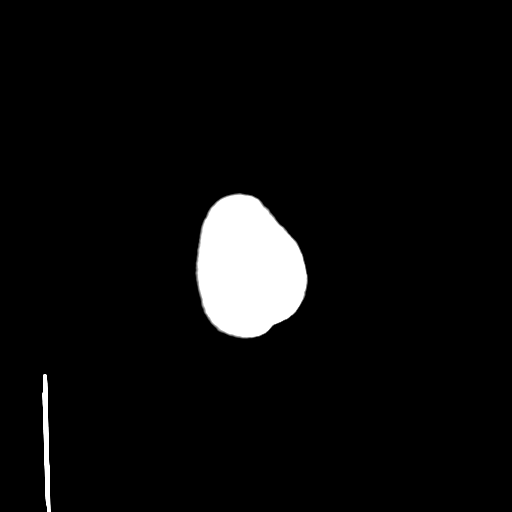
[im 28/31  bone]
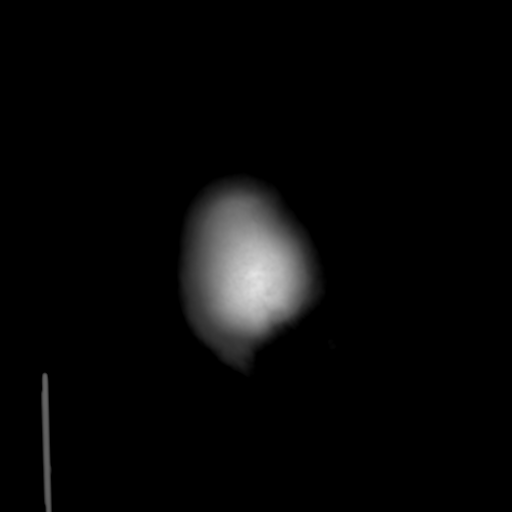

[Series 4: coronal soft · coronal · 0.31mm/px · 3 of 71 slices shown]
[im 24/71  brain]
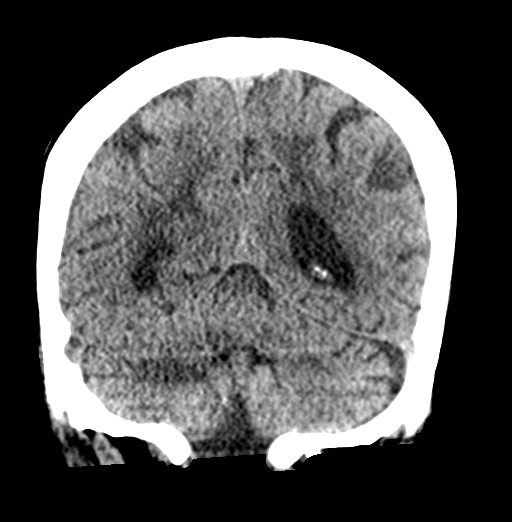
[im 32/71  brain]
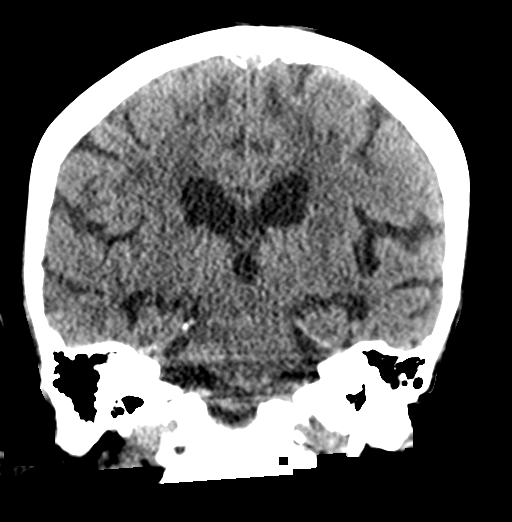
[im 39/71  brain]
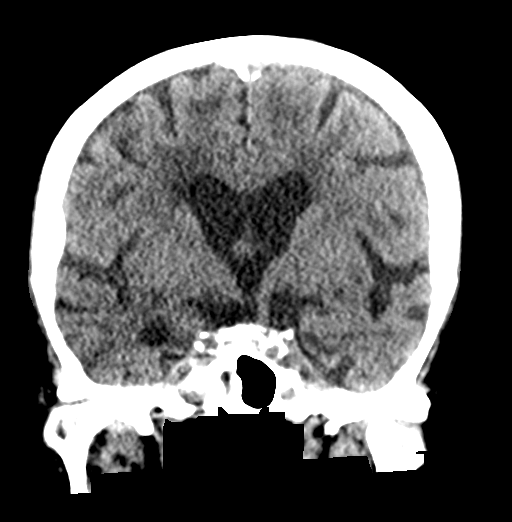

[Series 5: sagittal soft · sagittal · 0.33mm/px · 3 of 54 slices shown]
[im 18/54  brain]
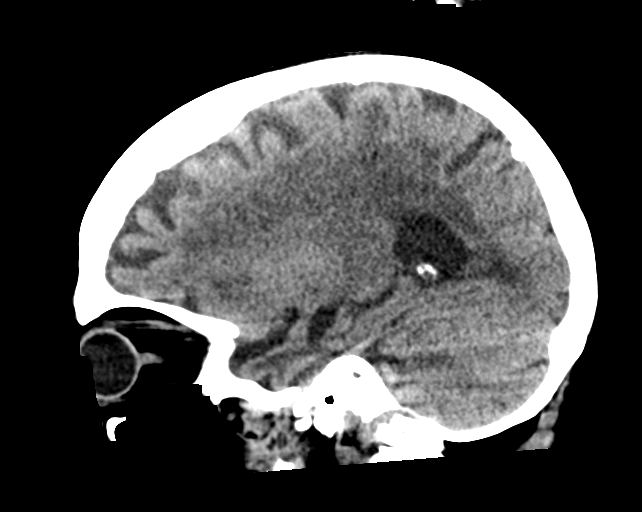
[im 27/54  brain]
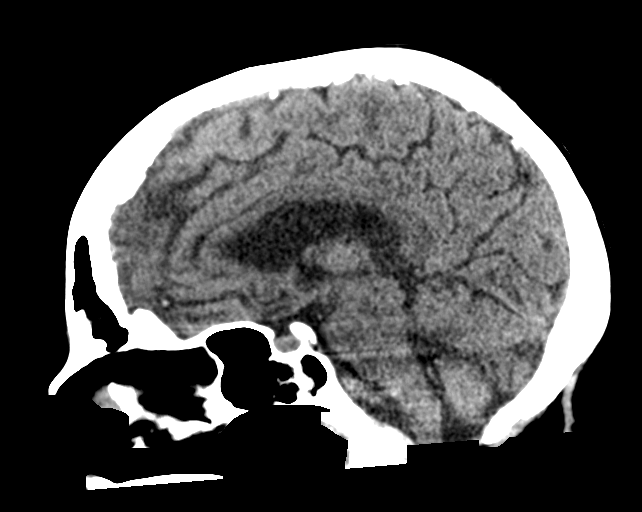
[im 36/54  brain]
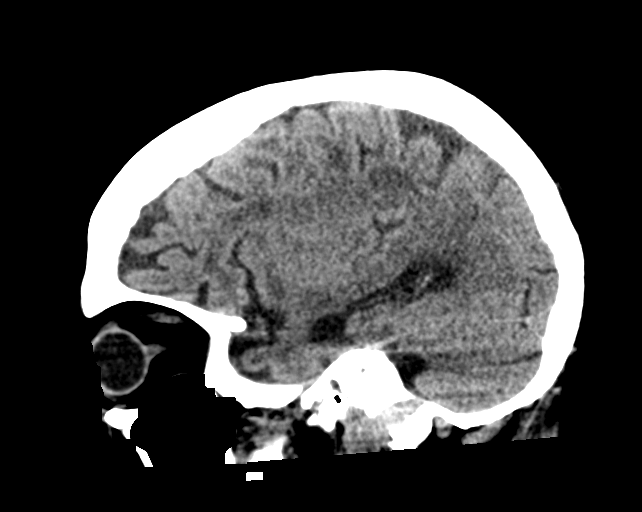

[15 of 47 positions shown; findings below may reference images not displayed]

FINDINGS: Brain: Moderately enlarged ventricles and subarachnoid spaces.
Moderate to marked patchy white matter low density in both cerebral
hemispheres. There is low density in the lateral aspect of the pons
on the right and in the right cerebellar hemisphere. These are in
areas of streak artifacts produced by the skull base and have an
appearance suggesting artifacts on the reconstructed sagittal and
coronal images. Otherwise, no intracranial hemorrhage, mass lesion
or CT evidence of acute infarction.

Vascular: No hyperdense vessel or unexpected calcification.

Skull: Normal. Negative for fracture or focal lesion.

Sinuses/Orbits: Status post bilateral cataract extraction. Opacified
left posterior ethmoid sinus.

Other: None.
IMPRESSION: 1. No acute abnormality.
2. Moderate diffuse cerebral and cerebellar atrophy.
3. Moderate to marked chronic small vessel white matter ischemic
changes in both cerebral hemispheres.
4. Probable artifacts in the lateral aspect of the pons on the right
and in the right cerebellar hemisphere. Areas of acute infarction
are significantly less likely.
5. Chronic left posterior ethmoid sinusitis.
